# Patient Record
Sex: Female | Born: 1965 | Race: Black or African American | Hispanic: No | Smoking: Former smoker
Health system: Southern US, Community
[De-identification: ages and names within clinical notes are randomized; demographics above are authoritative.]

## PROBLEM LIST (undated history)

## (undated) DIAGNOSIS — K219 Gastro-esophageal reflux disease without esophagitis: Secondary | ICD-10-CM

## (undated) DIAGNOSIS — F32A Depression, unspecified: Secondary | ICD-10-CM

## (undated) DIAGNOSIS — F419 Anxiety disorder, unspecified: Secondary | ICD-10-CM

## (undated) DIAGNOSIS — R51 Headache: Secondary | ICD-10-CM

## (undated) DIAGNOSIS — J189 Pneumonia, unspecified organism: Secondary | ICD-10-CM

## (undated) DIAGNOSIS — F209 Schizophrenia, unspecified: Secondary | ICD-10-CM

## (undated) DIAGNOSIS — R112 Nausea with vomiting, unspecified: Secondary | ICD-10-CM

## (undated) DIAGNOSIS — J449 Chronic obstructive pulmonary disease, unspecified: Secondary | ICD-10-CM

## (undated) DIAGNOSIS — E669 Obesity, unspecified: Secondary | ICD-10-CM

## (undated) DIAGNOSIS — F4310 Post-traumatic stress disorder, unspecified: Secondary | ICD-10-CM

## (undated) DIAGNOSIS — F329 Major depressive disorder, single episode, unspecified: Secondary | ICD-10-CM

## (undated) DIAGNOSIS — Z720 Tobacco use: Secondary | ICD-10-CM

## (undated) DIAGNOSIS — Z9889 Other specified postprocedural states: Secondary | ICD-10-CM

## (undated) DIAGNOSIS — I2510 Atherosclerotic heart disease of native coronary artery without angina pectoris: Secondary | ICD-10-CM

## (undated) DIAGNOSIS — D649 Anemia, unspecified: Secondary | ICD-10-CM

## (undated) DIAGNOSIS — L659 Nonscarring hair loss, unspecified: Secondary | ICD-10-CM

## (undated) DIAGNOSIS — R519 Headache, unspecified: Secondary | ICD-10-CM

## (undated) DIAGNOSIS — K2970 Gastritis, unspecified, without bleeding: Secondary | ICD-10-CM

## (undated) DIAGNOSIS — F319 Bipolar disorder, unspecified: Secondary | ICD-10-CM

## (undated) DIAGNOSIS — I10 Essential (primary) hypertension: Secondary | ICD-10-CM

## (undated) HISTORY — PX: CARDIAC CATHETERIZATION: SHX172

## (undated) HISTORY — PX: ABDOMINAL HYSTERECTOMY: SHX81

## (undated) HISTORY — PX: FRACTURE SURGERY: SHX138

## (undated) HISTORY — PX: TUBAL LIGATION: SHX77

## (undated) HISTORY — DX: Anemia, unspecified: D64.9

## (undated) HISTORY — PX: CHOLECYSTECTOMY: SHX55

## (undated) HISTORY — DX: Obesity, unspecified: E66.9

## (undated) HISTORY — DX: Post-traumatic stress disorder, unspecified: F43.10

## (undated) HISTORY — DX: Depression, unspecified: F32.A

## (undated) HISTORY — DX: Tobacco use: Z72.0

## (undated) HISTORY — DX: Major depressive disorder, single episode, unspecified: F32.9

## (undated) HISTORY — PX: LEG SURGERY: SHX1003

---

## 1998-07-18 ENCOUNTER — Emergency Department (HOSPITAL_COMMUNITY): Admission: EM | Admit: 1998-07-18 | Discharge: 1998-07-18 | Payer: Self-pay | Admitting: Emergency Medicine

## 1998-12-03 ENCOUNTER — Encounter: Payer: Self-pay | Admitting: Emergency Medicine

## 1998-12-03 ENCOUNTER — Emergency Department (HOSPITAL_COMMUNITY): Admission: EM | Admit: 1998-12-03 | Discharge: 1998-12-03 | Payer: Self-pay | Admitting: Emergency Medicine

## 1999-08-02 ENCOUNTER — Inpatient Hospital Stay (HOSPITAL_COMMUNITY): Admission: EM | Admit: 1999-08-02 | Discharge: 1999-08-02 | Payer: Self-pay | Admitting: Obstetrics

## 1999-08-02 ENCOUNTER — Encounter: Payer: Self-pay | Admitting: Obstetrics

## 1999-12-20 ENCOUNTER — Emergency Department (HOSPITAL_COMMUNITY): Admission: EM | Admit: 1999-12-20 | Discharge: 1999-12-20 | Payer: Self-pay | Admitting: Emergency Medicine

## 2001-04-07 ENCOUNTER — Emergency Department (HOSPITAL_COMMUNITY): Admission: EM | Admit: 2001-04-07 | Discharge: 2001-04-08 | Payer: Self-pay | Admitting: Emergency Medicine

## 2001-04-07 ENCOUNTER — Encounter: Payer: Self-pay | Admitting: Emergency Medicine

## 2002-05-29 ENCOUNTER — Inpatient Hospital Stay (HOSPITAL_COMMUNITY): Admission: AD | Admit: 2002-05-29 | Discharge: 2002-05-29 | Payer: Self-pay | Admitting: Obstetrics

## 2004-02-11 ENCOUNTER — Emergency Department (HOSPITAL_COMMUNITY): Admission: EM | Admit: 2004-02-11 | Discharge: 2004-02-11 | Payer: Self-pay | Admitting: Family Medicine

## 2004-05-04 ENCOUNTER — Emergency Department (HOSPITAL_COMMUNITY): Admission: EM | Admit: 2004-05-04 | Discharge: 2004-05-04 | Payer: Self-pay | Admitting: Family Medicine

## 2004-05-06 ENCOUNTER — Emergency Department (HOSPITAL_COMMUNITY): Admission: EM | Admit: 2004-05-06 | Discharge: 2004-05-06 | Payer: Self-pay | Admitting: Family Medicine

## 2005-02-18 ENCOUNTER — Ambulatory Visit (HOSPITAL_COMMUNITY): Admission: RE | Admit: 2005-02-18 | Discharge: 2005-02-18 | Payer: Self-pay | Admitting: Obstetrics

## 2008-02-29 DIAGNOSIS — I10 Essential (primary) hypertension: Secondary | ICD-10-CM

## 2008-02-29 HISTORY — DX: Essential (primary) hypertension: I10

## 2008-03-19 ENCOUNTER — Emergency Department (HOSPITAL_COMMUNITY): Admission: EM | Admit: 2008-03-19 | Discharge: 2008-03-19 | Payer: Self-pay | Admitting: Emergency Medicine

## 2008-05-02 ENCOUNTER — Emergency Department (HOSPITAL_COMMUNITY): Admission: EM | Admit: 2008-05-02 | Discharge: 2008-05-02 | Payer: Self-pay | Admitting: Emergency Medicine

## 2008-09-24 ENCOUNTER — Ambulatory Visit: Payer: Self-pay | Admitting: Psychiatry

## 2008-09-24 ENCOUNTER — Other Ambulatory Visit (HOSPITAL_COMMUNITY): Admission: RE | Admit: 2008-09-24 | Discharge: 2008-12-23 | Payer: Self-pay | Admitting: Psychiatry

## 2008-10-22 ENCOUNTER — Ambulatory Visit: Payer: Self-pay | Admitting: Psychiatry

## 2008-10-22 ENCOUNTER — Inpatient Hospital Stay (HOSPITAL_COMMUNITY): Admission: AD | Admit: 2008-10-22 | Discharge: 2008-10-27 | Payer: Self-pay | Admitting: Psychiatry

## 2008-10-22 ENCOUNTER — Ambulatory Visit (HOSPITAL_COMMUNITY): Payer: Self-pay | Admitting: Psychiatry

## 2008-12-23 ENCOUNTER — Encounter: Payer: Self-pay | Admitting: Internal Medicine

## 2008-12-23 ENCOUNTER — Ambulatory Visit (HOSPITAL_COMMUNITY): Admission: RE | Admit: 2008-12-23 | Discharge: 2008-12-23 | Payer: Self-pay | Admitting: Internal Medicine

## 2009-03-05 ENCOUNTER — Emergency Department (HOSPITAL_COMMUNITY): Admission: EM | Admit: 2009-03-05 | Discharge: 2009-03-05 | Payer: Self-pay | Admitting: Emergency Medicine

## 2009-03-05 ENCOUNTER — Emergency Department (HOSPITAL_COMMUNITY): Admission: EM | Admit: 2009-03-05 | Discharge: 2009-03-05 | Payer: Self-pay | Admitting: Family Medicine

## 2009-03-24 ENCOUNTER — Inpatient Hospital Stay (HOSPITAL_COMMUNITY)
Admission: EM | Admit: 2009-03-24 | Discharge: 2009-03-30 | Payer: Self-pay | Source: Home / Self Care | Admitting: Emergency Medicine

## 2009-03-24 DIAGNOSIS — J189 Pneumonia, unspecified organism: Secondary | ICD-10-CM | POA: Insufficient documentation

## 2009-03-25 ENCOUNTER — Encounter (INDEPENDENT_AMBULATORY_CARE_PROVIDER_SITE_OTHER): Payer: Self-pay | Admitting: Internal Medicine

## 2009-03-25 LAB — CONVERTED CEMR LAB
ALT: 15 units/L
BUN: 6 mg/dL
Chloride: 105 meq/L
Potassium: 136 meq/L
Total Protein: 6.2 g/dL

## 2009-03-30 ENCOUNTER — Encounter (INDEPENDENT_AMBULATORY_CARE_PROVIDER_SITE_OTHER): Payer: Self-pay | Admitting: Nurse Practitioner

## 2009-04-06 ENCOUNTER — Ambulatory Visit: Payer: Self-pay | Admitting: Nurse Practitioner

## 2009-04-06 DIAGNOSIS — F319 Bipolar disorder, unspecified: Secondary | ICD-10-CM | POA: Insufficient documentation

## 2009-04-06 DIAGNOSIS — R252 Cramp and spasm: Secondary | ICD-10-CM | POA: Insufficient documentation

## 2009-04-06 DIAGNOSIS — F411 Generalized anxiety disorder: Secondary | ICD-10-CM | POA: Insufficient documentation

## 2009-04-06 DIAGNOSIS — Z794 Long term (current) use of insulin: Secondary | ICD-10-CM

## 2009-04-06 DIAGNOSIS — Z9089 Acquired absence of other organs: Secondary | ICD-10-CM | POA: Insufficient documentation

## 2009-04-06 DIAGNOSIS — E8941 Symptomatic postprocedural ovarian failure: Secondary | ICD-10-CM | POA: Insufficient documentation

## 2009-04-06 DIAGNOSIS — F4310 Post-traumatic stress disorder, unspecified: Secondary | ICD-10-CM | POA: Insufficient documentation

## 2009-04-06 DIAGNOSIS — K219 Gastro-esophageal reflux disease without esophagitis: Secondary | ICD-10-CM | POA: Insufficient documentation

## 2009-04-06 DIAGNOSIS — I10 Essential (primary) hypertension: Secondary | ICD-10-CM | POA: Insufficient documentation

## 2009-04-06 DIAGNOSIS — E119 Type 2 diabetes mellitus without complications: Secondary | ICD-10-CM | POA: Insufficient documentation

## 2009-04-06 LAB — CONVERTED CEMR LAB
Blood Glucose, Fingerstick: 89
Nitrite: NEGATIVE
Specific Gravity, Urine: >=1.03
Urobilinogen, UA: 0.2
WBC Urine, dipstick: NEGATIVE
pH: 5.5

## 2009-04-07 ENCOUNTER — Encounter (INDEPENDENT_AMBULATORY_CARE_PROVIDER_SITE_OTHER): Payer: Self-pay | Admitting: Nurse Practitioner

## 2009-04-14 ENCOUNTER — Ambulatory Visit: Payer: Self-pay | Admitting: Nurse Practitioner

## 2009-04-28 ENCOUNTER — Ambulatory Visit: Payer: Self-pay | Admitting: Nurse Practitioner

## 2009-05-04 ENCOUNTER — Ambulatory Visit: Payer: Self-pay | Admitting: Nurse Practitioner

## 2009-05-04 LAB — CONVERTED CEMR LAB
Bilirubin Urine: NEGATIVE
Calcium: 9.2 mg/dL (ref 8.4–10.5)
Creatinine, Ser: 0.9 mg/dL (ref 0.40–1.20)
Glucose, Urine, Semiquant: NEGATIVE
Ketones, urine, test strip: NEGATIVE
Potassium: 4.5 meq/L (ref 3.5–5.3)
Protein, U semiquant: NEGATIVE
Specific Gravity, Urine: 1.01
Urobilinogen, UA: 0.2
WBC Urine, dipstick: NEGATIVE

## 2009-06-01 ENCOUNTER — Ambulatory Visit: Payer: Self-pay | Admitting: Nurse Practitioner

## 2009-06-01 LAB — CONVERTED CEMR LAB: Blood Glucose, Fingerstick: 128

## 2009-06-02 ENCOUNTER — Ambulatory Visit: Payer: Self-pay | Admitting: Nurse Practitioner

## 2009-07-02 ENCOUNTER — Ambulatory Visit: Payer: Self-pay | Admitting: Nurse Practitioner

## 2009-07-20 ENCOUNTER — Ambulatory Visit: Payer: Self-pay | Admitting: Nurse Practitioner

## 2009-07-23 ENCOUNTER — Encounter (INDEPENDENT_AMBULATORY_CARE_PROVIDER_SITE_OTHER): Payer: Self-pay | Admitting: Nurse Practitioner

## 2009-07-30 ENCOUNTER — Encounter (INDEPENDENT_AMBULATORY_CARE_PROVIDER_SITE_OTHER): Payer: Self-pay | Admitting: Nurse Practitioner

## 2009-08-03 ENCOUNTER — Ambulatory Visit: Payer: Self-pay | Admitting: Nurse Practitioner

## 2009-08-03 DIAGNOSIS — E669 Obesity, unspecified: Secondary | ICD-10-CM | POA: Insufficient documentation

## 2009-08-03 DIAGNOSIS — F17200 Nicotine dependence, unspecified, uncomplicated: Secondary | ICD-10-CM | POA: Insufficient documentation

## 2009-08-03 DIAGNOSIS — R3129 Other microscopic hematuria: Secondary | ICD-10-CM | POA: Insufficient documentation

## 2009-08-03 DIAGNOSIS — B370 Candidal stomatitis: Secondary | ICD-10-CM | POA: Insufficient documentation

## 2009-08-03 LAB — CONVERTED CEMR LAB
KOH Prep: NEGATIVE
Ketones, urine, test strip: NEGATIVE
OCCULT 1: NEGATIVE
Pap Smear: NEGATIVE
Protein, U semiquant: NEGATIVE
Specific Gravity, Urine: >=1.03
Urobilinogen, UA: 1

## 2009-08-04 ENCOUNTER — Encounter (INDEPENDENT_AMBULATORY_CARE_PROVIDER_SITE_OTHER): Payer: Self-pay | Admitting: Nurse Practitioner

## 2009-08-04 LAB — CONVERTED CEMR LAB
BUN: 16 mg/dL (ref 6–23)
Calcium: 9 mg/dL (ref 8.4–10.5)
Chlamydia, DNA Probe: NEGATIVE
Eosinophils Absolute: 0.2 10*3/uL (ref 0.0–0.7)
Glucose, Bld: 105 mg/dL — ABNORMAL HIGH (ref 70–99)
Hemoglobin: 10.7 g/dL — ABNORMAL LOW (ref 12.0–15.0)
LDL Cholesterol: 109 mg/dL — ABNORMAL HIGH (ref 0–99)
Lymphocytes Relative: 24 % (ref 12–46)
Lymphs Abs: 2.3 10*3/uL (ref 0.7–4.0)
Monocytes Absolute: 0.6 10*3/uL (ref 0.1–1.0)
Neutro Abs: 6.6 10*3/uL (ref 1.7–7.7)
Potassium: 4.4 meq/L (ref 3.5–5.3)
RBC: 4.2 M/uL (ref 3.87–5.11)
TSH: 1.381 microintl units/mL (ref 0.350–4.500)
Total Protein: 7.4 g/dL (ref 6.0–8.3)
Triglycerides: 82 mg/dL (ref <150)

## 2009-08-05 DIAGNOSIS — D649 Anemia, unspecified: Secondary | ICD-10-CM | POA: Insufficient documentation

## 2009-08-05 LAB — CONVERTED CEMR LAB: Retic Ct Pct: 1.6 % (ref 0.4–3.1)

## 2009-08-07 ENCOUNTER — Encounter (INDEPENDENT_AMBULATORY_CARE_PROVIDER_SITE_OTHER): Payer: Self-pay | Admitting: Nurse Practitioner

## 2009-08-11 ENCOUNTER — Ambulatory Visit (HOSPITAL_COMMUNITY): Admission: RE | Admit: 2009-08-11 | Discharge: 2009-08-11 | Payer: Self-pay | Admitting: Internal Medicine

## 2009-08-20 ENCOUNTER — Encounter (INDEPENDENT_AMBULATORY_CARE_PROVIDER_SITE_OTHER): Payer: Self-pay | Admitting: Nurse Practitioner

## 2009-08-24 ENCOUNTER — Encounter (INDEPENDENT_AMBULATORY_CARE_PROVIDER_SITE_OTHER): Payer: Self-pay | Admitting: Nurse Practitioner

## 2009-08-25 ENCOUNTER — Ambulatory Visit: Payer: Self-pay | Admitting: Nurse Practitioner

## 2009-08-31 ENCOUNTER — Inpatient Hospital Stay (HOSPITAL_COMMUNITY): Admission: EM | Admit: 2009-08-31 | Discharge: 2009-09-03 | Payer: Self-pay | Admitting: Emergency Medicine

## 2009-09-02 ENCOUNTER — Ambulatory Visit: Payer: Self-pay | Admitting: Vascular Surgery

## 2009-09-02 ENCOUNTER — Encounter (INDEPENDENT_AMBULATORY_CARE_PROVIDER_SITE_OTHER): Payer: Self-pay | Admitting: Internal Medicine

## 2009-09-03 ENCOUNTER — Encounter (INDEPENDENT_AMBULATORY_CARE_PROVIDER_SITE_OTHER): Payer: Self-pay | Admitting: Nurse Practitioner

## 2009-09-10 ENCOUNTER — Ambulatory Visit: Payer: Self-pay | Admitting: Nurse Practitioner

## 2009-09-14 ENCOUNTER — Ambulatory Visit: Payer: Self-pay | Admitting: Diagnostic Radiology

## 2009-09-14 ENCOUNTER — Emergency Department (HOSPITAL_BASED_OUTPATIENT_CLINIC_OR_DEPARTMENT_OTHER): Admission: EM | Admit: 2009-09-14 | Discharge: 2009-09-14 | Payer: Self-pay | Admitting: Emergency Medicine

## 2009-10-20 ENCOUNTER — Ambulatory Visit: Payer: Self-pay | Admitting: Nurse Practitioner

## 2009-12-23 ENCOUNTER — Telehealth (INDEPENDENT_AMBULATORY_CARE_PROVIDER_SITE_OTHER): Payer: Self-pay | Admitting: Nurse Practitioner

## 2010-02-04 ENCOUNTER — Observation Stay (HOSPITAL_COMMUNITY)
Admission: EM | Admit: 2010-02-04 | Discharge: 2010-02-05 | Payer: Self-pay | Source: Home / Self Care | Attending: Family Medicine | Admitting: Family Medicine

## 2010-02-04 ENCOUNTER — Emergency Department (HOSPITAL_COMMUNITY)
Admission: EM | Admit: 2010-02-04 | Discharge: 2010-02-04 | Disposition: A | Discharge status: Other Acute Inpatient Hospital | Payer: Self-pay | Source: Home / Self Care | Admitting: Emergency Medicine

## 2010-02-04 LAB — CONVERTED CEMR LAB
BUN: 5 mg/dL
Hemoglobin: 10.4 g/dL
MCV: 77.3 fL
Potassium: 3.4 meq/L
RBC: 4.14 M/uL
Sodium: 137 meq/L
WBC: 11.1 10*3/uL

## 2010-03-25 ENCOUNTER — Ambulatory Visit
Admission: RE | Admit: 2010-03-25 | Discharge: 2010-03-25 | Payer: Self-pay | Source: Home / Self Care | Attending: Nurse Practitioner | Admitting: Nurse Practitioner

## 2010-03-25 LAB — CONVERTED CEMR LAB
Blood Glucose, Fingerstick: 232
Glucose, Urine, Semiquant: >=1000
Nitrite: NEGATIVE
Specific Gravity, Urine: >=1.03

## 2010-03-26 ENCOUNTER — Telehealth (INDEPENDENT_AMBULATORY_CARE_PROVIDER_SITE_OTHER): Payer: Self-pay | Admitting: Nurse Practitioner

## 2010-03-30 NOTE — Progress Notes (Signed)
Summary: Office Visit//DEPRESION SCREENING  Office Visit//DEPRESION SCREENING   Imported By: Arta Bruce 08/04/2009 09:47:51  _____________________________________________________________________  External Attachment:    Type:   Image     Comment:   External Document

## 2010-03-30 NOTE — Letter (Signed)
Summary: PT INFORMATION SHEET  PT INFORMATION SHEET   Imported By: Arta Bruce 05/27/2009 13:01:36  _____________________________________________________________________  External Attachment:    Type:   Image     Comment:   External Document

## 2010-03-30 NOTE — Assessment & Plan Note (Signed)
Summary: Diabetes   Vital Signs:  Patient profile:   45 year old female Menstrual status:  hysterectomy Weight:      239.8 pounds BMI:     42.63 BSA:     2.09 Temp:     97.9 degrees F oral Pulse rate:   96 / minute Pulse rhythm:   regular Resp:     20 per minute BP sitting:   126 / 85  (left arm) Cuff size:   large  Vitals Entered By: Levon Hedger (June 01, 2009 2:10 PM) CC: follow-up visit DM....muscle spasms...left foot pain, Depression, Hypertension Management, Abdominal Pain Is Patient Diabetic? Yes Pain Assessment Patient in pain? yes     Location: left foot, leg CBG Result 128 CBG Device ID B  Does patient need assistance? Functional Status Self care Ambulation Normal   CC:  follow-up visit DM....muscle spasms...left foot pain, Depression, Hypertension Management, and Abdominal Pain.  History of Present Illness:  Pt into the office for follow up on diabetes.  Social - Pt is still living with her significant other. She has 24 supervision from family and friends.  She is not driving at this time. [facility] is coming out to the pt's house to check on medications.    Obesity - weight gain since her last visit.  Pt seems disappointed in her weight gain.  ? as if any of her medications are causing the problems  cousin present with pt today The patient denies that she feels like life is not worth living, denies that she wishes that she were dead, and denies that she has thought about ending her life.        Comments:  Pt has been to the [facility] since her last visit in this office.  Her medication as been adjusted.  Dyspepsia History:      She has no alarm features of dyspepsia including no history of melena, hematochezia, dysphagia, persistent vomiting, or involuntary weight loss > 5%.  There is a prior history of GERD.  The patient does not have a prior history of documented ulcer disease.  The dominant symptom is not heartburn or acid  reflux.  An H-2 blocker medication is not currently being taken.  No previous upper endoscopy has been done.    Hypertension History:      She denies headache, chest pain, and palpitations.  She notes no problems with any antihypertensive medication side effects.  Pt is taking her medications as ordered.        Positive major cardiovascular risk factors include diabetes and hypertension.  Negative major cardiovascular risk factors include female age less than 71 years old.        Further assessment for target organ damage reveals no history of ASHD, cardiac end-organ damage (CHF/LVH), stroke/TIA, peripheral vascular disease, renal insufficiency, or hypertensive retinopathy.       Allergies (verified): 1)  ! Pepcid 2)  ! Demerol  Review of Systems ENT:  Denies earache. CV:  Denies chest pain or discomfort. Resp:  Complains of shortness of breath. GI:  Denies abdominal pain, nausea, and vomiting. Psych:  Complains of anxiety, depression, and mental problems.  Physical Exam  General:  alert.  obese Head:  normocephalic.   Ears:  ear piercing(s) noted.   Lungs:  normal breath sounds.   Heart:  normal rate and regular rhythm.   Abdomen:  non-tender and normal bowel sounds.    Diabetes Management Exam:    Foot Exam (with socks  and/or shoes not present):       Sensory-Monofilament:          Left foot: normal          Right foot: normal   Impression & Recommendations:  Problem # 1:  DIABETES MELLITUS (ICD-250.00) stable continue current medications Her updated medication list for this problem includes:    Metformin Hcl 500 Mg Tabs (Metformin hcl) ..... One tablet by mouth two times a day for diabetes    Aspirin 81 Mg Tbec (Aspirin) ..... One tablet by mouth daily  Orders: Capillary Blood Glucose/CBG (54098)  Problem # 2:  BIPOLAR AFFECTIVE DISORDER (ICD-296.80) pt to maintain f/u at the [facility] still very agitated and with lose thought processes  Problem # 3:   HYPERTENSION, BENIGN ESSENTIAL (ICD-401.1) BP is stable DASH diet Her updated medication list for this problem includes:    Atenolol 25 Mg Tabs (Atenolol) ..... One tablet by mouth two times a day for blood pressure    Furosemide 20 Mg Tabs (Furosemide) .Marland Kitchen... 2 tablets by mouth daily  Problem # 4:  GERD (ICD-530.81) stable Her updated medication list for this problem includes:    Omeprazole 20 Mg Tbec (Omeprazole) ..... One tablet by mouth two times a day before breakfast and dinner  Complete Medication List: 1)  Metformin Hcl 500 Mg Tabs (Metformin hcl) .... One tablet by mouth two times a day for diabetes 2)  Atenolol 25 Mg Tabs (Atenolol) .... One tablet by mouth two times a day for blood pressure 3)  Lorazepam 0.5 Mg Tabs (Lorazepam) .... One tablet by mouth two times a day 4)  Furosemide 20 Mg Tabs (Furosemide) .... 2 tablets by mouth daily 5)  Omeprazole 20 Mg Tbec (Omeprazole) .... One tablet by mouth two times a day before breakfast and dinner 6)  Seroquel 200 Mg Tabs (Quetiapine fumarate) .... Four tablets by mouth nighty 7)  Seroquel 25 Mg Tabs (Quetiapine fumarate) .... One tablet by mouth two times a day 8)  Aspirin 81 Mg Tbec (Aspirin) .... One tablet by mouth daily 9)  Blood Glucose Meter Kit (Blood glucose monitoring suppl) .... Use to check blood sugar daily before breakfast 10)  Tramadol Hcl 50 Mg Tabs (Tramadol hcl) .... One tablet by mouth daily as needed for pain 11)  Fluoxetine Hcl 20 Mg Caps (Fluoxetine hcl) .... 2 capsules by mouth daily **rx by psych*8  Hypertension Assessment/Plan:      [name] hypertensive risk group is category C: Target organ damage and/or diabetes.  Today's blood pressure is 126/85.  Her blood pressure goal is < 130/80.  Patient Instructions: 1)  Roll a can or a ball under your foot.  10 in the morning and 10 times at night. 2)  Increase exercise - you need to go for walks in the afternoons  3)  This will help build up your lung  functions 4)  Keep your appointments at [facility] 5)  Follow up in this office in 2 months for a complete complete physical exam.  you will need PAP, EKG, mammogram. Prescriptions: ASPIRIN 81 MG TBEC (ASPIRIN) One tablet by mouth daily  #30 x 5   Entered and Authorized by:   Lehman Prom FNP   Signed by:   Lehman Prom FNP on 06/01/2009   Method used:   Print then Give to Patient   RxID:   [id number] OMEPRAZOLE 20 MG TBEC (OMEPRAZOLE) One tablet by mouth two times a day before breakfast  and dinner  #60 x 5   Entered and Authorized by:   Lehman Prom FNP   Signed by:   Lehman Prom FNP on 06/01/2009   Method used:   Print then Give to Patient   RxID:   [id number] FUROSEMIDE 20 MG TABS (FUROSEMIDE) 2 tablets by mouth daily  #60 x 5   Entered and Authorized by:   Lehman Prom FNP   Signed by:   Lehman Prom FNP on 06/01/2009   Method used:   Print then Give to Patient   RxID:   [id number] METFORMIN HCL 500 MG TABS (METFORMIN HCL) One tablet by mouth two times a day for diabetes  #60 x 5   Entered and Authorized by:   Lehman Prom FNP   Signed by:   Lehman Prom FNP on 06/01/2009   Method used:   Print then Give to Patient   RxID:   [id number] ATENOLOL 25 MG TABS (ATENOLOL) One tablet by mouth two times a day for blood pressure  #60 x 5   Entered and Authorized by:   Lehman Prom FNP   Signed by:   Lehman Prom FNP on 06/01/2009   Method used:   Print then Give to Patient   RxID:   [id number]          Diabetic Foot Exam    10-g (5.07) Semmes-Weinstein Monofilament Test Performed by: Levon Hedger          Right Foot          Left Foot Visual Inspection               Test Control      normal         normal Site 1         normal         normal Site 2         normal         normal Site 3         normal         normal Site 4         abnormal         normal Site 5         abnormal          normal Site 6         abnormal         normal Site 7         normal         normal Site 8         normal         normal Site 9         normal         normal Site 10         abnormal         normal  Impression      normal         normal

## 2010-03-30 NOTE — Assessment & Plan Note (Signed)
Summary: F/u Hospital visit   Vital Signs:  Patient profile:   45 year old female Menstrual status:  hysterectomy Weight:      244.2 pounds BMI:     43.41 O2 Sat:      100 % on Room air Temp:     98.3 degrees F oral Pulse rate:   88 / minute Pulse rhythm:   regular Resp:     20 per minute BP sitting:   124 / 80  (left arm) Cuff size:   large  Vitals Entered By: Levon Hedger (October 20, 2009 11:00 AM)  Nutrition Counseling: Patient's BMI is greater than 25 and therefore counseled on weight management options.  O2 Flow:  Room air  Serial Vital Signs/Assessments:  Comments: after walking 02 sat was 100% Pulse 98 By: Levon Hedger   CC: chest pain x 3 days feels out of breath and it feels like fluid has come back around in her chest...feels heaviness in her chest., Hypertension Management, Abdominal Pain Is Patient Diabetic? Yes Pain Assessment Patient in pain? yes     Location: chest Type: heaviness CBG Result 63 CBG Device ID B  Does patient need assistance? Functional Status Self care Ambulation Normal   CC:  chest pain x 3 days feels out of breath and it feels like fluid has come back around in her chest...feels heaviness in her chest., Hypertension Management, and Abdominal Pain.  History of Present Illness:  Pt into the office for f/u - "chest pain" She was hospitalized from 08/31/2009 and 09/03/2009. She had scheduled f/u visits in this office but did not keep the appt. Full hospital D/C reviewed  1.  Bilateral community acquired pneumonia - In the hospital pt was started on Avelox.  CT angiogram done - negative and dopplers - negative.  Pt was switched to cipro and she has already finished these meds.  2.  Substeranl chest pain, pleuritic - main complaint was mild central chest pain on deep inspiration.  Pain was thought to be musculosketal in nature  3. Bipolar - pt has ongoing f/u with the [facility].  Her medications have been changed by that  office.  Medications present today in office.  Diabetes Management History:      The patient is a 45 years old female who comes in for evaluation of DM Type 2.  She has not been enrolled in the "Diabetic Education Program".  She states lack of understanding of dietary principles and is not following her diet appropriately.  No sensory loss is reported.  Self foot exams are not being performed.  She is checking home blood sugars.  She says that she is not exercising regularly.        Hypoglycemic symptoms are not occurring.  No hyperglycemic symptoms are reported.    Dyspepsia History:      She has no alarm features of dyspepsia including no history of melena, hematochezia, dysphagia, persistent vomiting, or involuntary weight loss > 5%.  There is a prior history of GERD.  The patient does not have a prior history of documented ulcer disease.  The dominant symptom is not heartburn or acid reflux.  An H-2 blocker medication is not currently being taken.  She has no history of a positive H. Pylori serology.  No previous upper endoscopy has been done.    Hypertension History:      She denies headache, chest pain, and palpitations.  She notes no problems with any antihypertensive medication side effects.  Positive major cardiovascular risk factors include diabetes, hypertension, and current tobacco user.  Negative major cardiovascular risk factors include female age less than 61 years old.        Further assessment for target organ damage reveals no history of ASHD, cardiac end-organ damage (CHF/LVH), stroke/TIA, peripheral vascular disease, renal insufficiency, or hypertensive retinopathy.      Habits & Providers  Alcohol-Tobacco-Diet     Alcohol drinks/day: <1     Tobacco Status: current     Tobacco Counseling: to quit use of tobacco products     Cigarette Packs/Day: 2.0  Exercise-Depression-Behavior     Does Patient Exercise: no     Depression Counseling: further diagnostic testing  and/or other treatment is indicated     Drug Use: never  Current Medications (verified): 1)  Metformin Hcl 500 Mg Tabs (Metformin Hcl) .... One Tablet By Mouth Two Times A Day For Diabetes 2)  Atenolol 25 Mg Tabs (Atenolol) .... One Tablet By Mouth Two Times A Day For Blood Pressure 3)  Lorazepam 0.5 Mg Tabs (Lorazepam) .... One Tablet By Mouth Two Times A Day 4)  Furosemide 20 Mg Tabs (Furosemide) .... 2 Tablets By Mouth Daily 5)  Protonix 40 Mg Tbec (Pantoprazole Sodium) .... One Tablet By Mouth Two Times A Day For Stomach 6)  Aspirin 81 Mg Tbec (Aspirin) .... One Tablet By Mouth Daily 7)  Blood Glucose Meter  Kit (Blood Glucose Monitoring Suppl) .... Use To Check Blood Sugar Daily Before Breakfast 8)  Fluoxetine Hcl 20 Mg Caps (Fluoxetine Hcl) .... 2 Capsules By Mouth Daily **rx By Psych*8 9)  Glucotrol Xl 5 Mg Xr24h-Tab (Glipizide) .... One Tablet By Mouth Daily For Blood Sugar 10)  Naprosyn 500 Mg Tabs (Naproxen) .... One Tablet By Mouth Daily As Needed For Leg Pain 11)  Ferrous Sulfate 325 (65 Fe) Mg Tabs (Ferrous Sulfate) .... One Tablet By Mouth Daily 12)  Zolpidem Tartrate 10 Mg Tabs (Zolpidem Tartrate) .... One Tablet By Mouth Nightly  **rx By Psych** 13)  Risperidone 3 Mg Tabs (Risperidone) .... 1/2 Tablet By Mouth At Bedtime **rx By Psych** 14)  Hydroxyzine Hcl 50 Mg Tabs (Hydroxyzine Hcl) .... One or Two Tablets By Mouth At Bedtime **rx By Psych**  Allergies (verified): 1)  ! Pepcid 2)  ! Demerol  Social History: Does Patient Exercise:  no  Review of Systems CV:  Complains of chest pain or discomfort; denies shortness of breath with exertion. Resp:  Denies cough. GI:  Denies abdominal pain, nausea, and vomiting.  Physical Exam  General:  alert.   Head:  normocephalic.   Lungs:  normal breath sounds.   Heart:  normal rate and regular rhythm.   Abdomen:  normal bowel sounds.   Msk:  normal ROM.   Neurologic:  alert & oriented X3.   Skin:  color normal.   Psych:   Oriented X3.    Diabetes Management Exam:       Nails:          Left foot: normal          Right foot: normal   Impression & Recommendations:  Problem # 1:  Hx of PNEUMONIA (ICD-486) pt has finished antibiotics o2 sat WNL will give advair inhalar (sample) Orders: Pulse Oximetry (single measurment) (16109)  Problem # 2:  DIABETES MELLITUS (ICD-250.00)  Her updated medication list for this problem includes:    Metformin Hcl 500 Mg Tabs (Metformin hcl) ..... One tablet by mouth two times a day for  diabetes    Aspirin 81 Mg Tbec (Aspirin) ..... One tablet by mouth daily    Glucotrol Xl 5 Mg Xr24h-tab (Glipizide) ..... One tablet by mouth daily for blood sugar  Orders: Capillary Blood Glucose/CBG (04540)  Problem # 3:  HYPERTENSION, BENIGN ESSENTIAL (ICD-401.1)  Her updated medication list for this problem includes:    Atenolol 25 Mg Tabs (Atenolol) ..... One tablet by mouth two times a day for blood pressure    Furosemide 20 Mg Tabs (Furosemide) .Marland Kitchen... 2 tablets by mouth daily  Problem # 4:  ANEMIA (ICD-285.9)  Her updated medication list for this problem includes:    Ferrous Sulfate 325 (65 Fe) Mg Tabs (Ferrous sulfate) ..... One tablet by mouth daily  Complete Medication List: 1)  Metformin Hcl 500 Mg Tabs (Metformin hcl) .... One tablet by mouth two times a day for diabetes 2)  Atenolol 25 Mg Tabs (Atenolol) .... One tablet by mouth two times a day for blood pressure 3)  Lorazepam 0.5 Mg Tabs (Lorazepam) .... One tablet by mouth two times a day 4)  Furosemide 20 Mg Tabs (Furosemide) .... 2 tablets by mouth daily 5)  Protonix 40 Mg Tbec (Pantoprazole sodium) .... One tablet by mouth two times a day for stomach 6)  Aspirin 81 Mg Tbec (Aspirin) .... One tablet by mouth daily 7)  Blood Glucose Meter Kit (Blood glucose monitoring suppl) .... Use to check blood sugar daily before breakfast 8)  Fluoxetine Hcl 20 Mg Caps (Fluoxetine hcl) .... 2 capsules by mouth daily **rx by  psych*8 9)  Glucotrol Xl 5 Mg Xr24h-tab (Glipizide) .... One tablet by mouth daily for blood sugar 10)  Naprosyn 500 Mg Tabs (Naproxen) .... One tablet by mouth daily as needed for leg pain 11)  Ferrous Sulfate 325 (65 Fe) Mg Tabs (Ferrous sulfate) .... One tablet by mouth daily 12)  Zolpidem Tartrate 10 Mg Tabs (Zolpidem tartrate) .... One tablet by mouth nightly  **rx by psych** 13)  Risperidone 3 Mg Tabs (Risperidone) .... 1/2 tablet by mouth at bedtime **rx by psych** 14)  Hydroxyzine Hcl 50 Mg Tabs (Hydroxyzine hcl) .... One or two tablets by mouth at bedtime **rx by psych**  Diabetes Management Assessment/Plan:      Her blood pressure goal is < 130/80.    Hypertension Assessment/Plan:      [name] hypertensive risk group is category C: Target organ damage and/or diabetes.  Her calculated 10 year risk of coronary heart disease is 8 %.  Today's blood pressure is 124/80.  Her blood pressure goal is < 130/80.  Patient Instructions: 1)  Use the Advair - 1 inhalation twice per day (sample given) 2)  Chest pain may be due to shortness of breath. 3)  Continue current medications as ordered 4)  Follow up in this office in 4 weeks or sooner if necessary. 5)  Will need cbc and follow up cxray.  Laboratory Results   Blood Tests   Date/Time Received: October 20, 2009 12:47 PM   CBG Random:: 63       MRI EXAM  Procedure date:  08/31/2009  Findings:      CT angiogram - no Ct findings for pulmonary embolism.   normal thoracic aorta borderline enlarged mediastinal and hilar lymph nodes and persistence or recurrent patchy bilateral air space process  CXR  Procedure date:  08/31/2009  Findings:      Impression, widespread bilateral patchy pulmonary infilatrated consistent with bronchopneumonia  Venous Doppler  Procedure date:  08/31/2009  Findings:      no evidence of DVT or superficial thrombosis involoving the right lower extremity and left lower extremity no evidence  of baker's cyst on right or left   MRI EXAM  Procedure date:  08/31/2009  Findings:      CT angiogram - no Ct findings for pulmonary embolism.   normal thoracic aorta borderline enlarged mediastinal and hilar lymph nodes and persistence or recurrent patchy bilateral air space process  CXR  Procedure date:  08/31/2009  Findings:      Impression, widespread bilateral patchy pulmonary infilatrated consistent with bronchopneumonia  Venous Doppler  Procedure date:  08/31/2009  Findings:      no evidence of DVT or superficial thrombosis involoving the right lower extremity and left lower extremity no evidence of baker's cyst on right or left

## 2010-03-30 NOTE — Assessment & Plan Note (Signed)
Summary: Left without being seen   Allergies: 1)  ! Pepcid 2)  ! Demerol   Complete Medication List: 1)  Metformin Hcl 500 Mg Tabs (Metformin hcl) .... One tablet by mouth two times a day for diabetes 2)  Atenolol 25 Mg Tabs (Atenolol) .... One tablet by mouth two times a day for blood pressure 3)  Lorazepam 0.5 Mg Tabs (Lorazepam) .... One tablet by mouth two times a day 4)  Furosemide 20 Mg Tabs (Furosemide) .... 2 tablets by mouth daily 5)  Omeprazole 20 Mg Tbec (Omeprazole) .... One tablet by mouth two times a day before breakfast and dinner 6)  Seroquel 200 Mg Tabs (Quetiapine fumarate) .... Four tablets by mouth nighty 7)  Seroquel 25 Mg Tabs (Quetiapine fumarate) .... One tablet by mouth two times a day 8)  Aspirin 81 Mg Tbec (Aspirin) .... One tablet by mouth daily 9)  Blood Glucose Meter Kit (Blood glucose monitoring suppl) .... Use to check blood sugar daily before breakfast 10)  Tramadol Hcl 50 Mg Tabs (Tramadol hcl) .... One tablet by mouth daily as needed for pain 11)  Fluoxetine Hcl 20 Mg Caps (Fluoxetine hcl) .... 2 capsules by mouth daily **rx by psych*8 12)  Abilify 15 Mg Tabs (Aripiprazole) .... Rx by [facility] 13)  Nystatin 100000 Unit/ml Susp (Nystatin) .... 5ml by mouth three times a day before meals swish and swallow 14)  Glucotrol Xl 5 Mg Xr24h-tab (Glipizide) .... One tablet by mouth daily for blood sugar 15)  Naprosyn 500 Mg Tabs (Naproxen) .... One tablet by mouth daily as needed for leg pain 16)  Ferrous Sulfate 325 (65 Fe) Mg Tabs (Ferrous sulfate) .... One tablet by mouth daily 17)  Fluconazole 150 Mg Tabs (Fluconazole) .... One tablet by mouth x 1 dose

## 2010-03-30 NOTE — Letter (Signed)
Summary: FAXED REQUESTED RECORDS TO [facility]  FAXED REQUESTED RECORDS TO [facility]   Imported By: Arta Bruce 07/23/2009 11:16:30  _____________________________________________________________________  External Attachment:    Type:   Image     Comment:   External Document

## 2010-03-30 NOTE — Letter (Addendum)
Summary: DISABILITY [facility] Alvarez  DISABILITY [facility]varez   Imported By: Arta Bruce 11/26/2009 12:40:58  _____________________________________________________________________  External Attachments:     1. Type:   Image          Comment:   External Document    2. Type:   Image          Comment:   External Document

## 2010-03-30 NOTE — Letter (Signed)
Summary: *HSN Results Follow up  [facility]  [address] [place], [place] [address]   Phone: [phone]  Fax: [phone]      08/07/2009   [phone] [place], [place]  [address]   Dear  Ms. [facility] Gertz,                            ____S.Drinkard,FNP   ____D. Gore,FNP       ____B. McPherson,MD   ____V. Rankins,MD    ____E. Mulberry,MD    __X__N. Daphine Deutscher, FNP  ____D. Reche Dixon, MD    ____K. Philipp Deputy, MD    ____Other     This letter is to inform you that your recent test(s):  ___X____Pap Smear    _______Lab Test     _______X-ray    ___X____ is within acceptable limits  _______ requires a medication change  _______ requires a follow-up lab visit  _______ requires a follow-up visit with your provider   Comments: Pap smear results normal during recent except you did have a yeast infection.  A prescription for diflucan 150mg  has been sent to [facility] pharmacy.         _________________________________________________________ If you have any questions, please contact our office [phone]                    Sincerely,    Lehman Prom FNP [facility]

## 2010-03-30 NOTE — Letter (Signed)
Summary: Discharge Summary  Discharge Summary   Imported By: Arta Bruce 09/16/2009 12:15:49  _____________________________________________________________________  External Attachment:    Type:   Image     Comment:   External Document

## 2010-03-30 NOTE — Letter (Signed)
Summary: Discharge Summary  Discharge Summary   Imported By: Arta Bruce 08/04/2009 09:29:25  _____________________________________________________________________  External Attachment:    Type:   Image     Comment:   External Document

## 2010-03-30 NOTE — Letter (Signed)
Summary: FAXED REQUESTED RECORDS TO THE [facility]  FAXED REQUESTED RECORDS TO THE [facility]   Imported By: Arta Bruce 09/14/2009 10:14:53  _____________________________________________________________________  External Attachment:    Type:   Image     Comment:   External Document

## 2010-03-30 NOTE — Assessment & Plan Note (Signed)
Summary: Diabetes   Vital Signs:  Patient profile:   45 year old female Menstrual status:  hysterectomy Height:      63 inches Weight:      234.8 pounds Temp:     97.5 degrees F oral Pulse rate:   85 / minute Pulse rhythm:   regular Resp:     24 per minute BP sitting:   119 / 85  (right arm) Cuff size:   large  Vitals Entered By: Arthor Captain (May 04, 2009 10:03 AM) CC: f/u dm, Hypertension Management, Abdominal Pain Is Patient Diabetic? Yes Pain Assessment Patient in pain? yes     Location: leg pain Intensity: 9 Type: cramping Onset of pain  3 weeks CBG Result 145 CBG Device ID B  Does patient need assistance? Functional Status Self care Ambulation Normal Comments leg cramps, feels SOB has to sleep with 3 pillows behind her head, trouble sleeping   CC:  f/u dm, Hypertension Management, and Abdominal Pain.  History of Present Illness:  Pt into the office for diabetes.  Family present today in office.  Reports that since 08/2008 pt was functional with activities of daily living and also employed.   Pt had a nervous breakdown and had to go into behavioral health.  Since that time she has been very difficult to maintain.  Family reports that at this point she is not left alone for any extended period of time due to her safety.  Family reports that she used the scissors and cut off all her hair, she is wearing a weave today.  Diabetes Management History:      The patient is a 45 years old female who comes in for evaluation of DM Type 2.  She has not been enrolled in the "Diabetic Education Program".  She states understanding of dietary principles and is following her diet appropriately.  No sensory loss is reported.  Self foot exams are not being performed.  She is checking home blood sugars.        Other comments include: pt is checking her blood sugar twice daily.  She did not bring the values with her into the office today.        No changes have been made to her  treatment plan since last visit.    Dyspepsia History:      There is a prior history of GERD.  The patient does not have a prior history of documented ulcer disease.    Hypertension History:      She denies headache, chest pain, and palpitations.  She notes no problems with any antihypertensive medication side effects.        Positive major cardiovascular risk factors include diabetes and hypertension.  Negative major cardiovascular risk factors include female age less than 52 years old.        Further assessment for target organ damage reveals no history of ASHD, cardiac end-organ damage (CHF/LVH), stroke/TIA, peripheral vascular disease, renal insufficiency, or hypertensive retinopathy.      Allergies (verified): 1)  ! Pepcid 2)  ! Demerol  Review of Systems General:  Denies fever. CV:  Denies fatigue. Resp:  Denies cough. GI:  Denies abdominal pain, nausea, and vomiting. Neuro:  Denies headaches. Psych:  Complains of irritability.  Physical Exam  General:  well-developed.   Head:  normocephalic.   Lungs:  normal breath sounds.   Heart:  normal rate and regular rhythm.   Abdomen:  soft, non-tender, and normal bowel sounds.  Msk:  up to the exam table Neurologic:  alert & oriented X3.   Skin:  color normal.   Psych:  moderately anxious.     Impression & Recommendations:  Problem # 1:  DIABETES MELLITUS (ICD-250.00) continue current meds Her updated medication list for this problem includes:    Metformin Hcl 500 Mg Tabs (Metformin hcl) ..... One tablet by mouth two times a day for diabetes    Aspirin 81 Mg Tbec (Aspirin) ..... One tablet by mouth daily  Orders: Capillary Blood Glucose/CBG (82948) Hemoglobin A1C (83036)  Problem # 2:  HYPERTENSION, BENIGN ESSENTIAL (ICD-401.1) DASH diet continue current meds Her updated medication list for this problem includes:    Atenolol 25 Mg Tabs (Atenolol) ..... One tablet by mouth two times a day for blood pressure     Furosemide 20 Mg Tabs (Furosemide) .Marland Kitchen... 2 tablets by mouth daily  Problem # 3:  BIPOLAR AFFECTIVE DISORDER (ICD-296.80) concerned with pt Spoke with Aquilla Solian in office regarding the pt.  She had actually already seen the pt for a consult last week even prior to pt being seen by the provider. Given pt's high level of anxiety, she will attempt to contact the [facility] to see if pt can go in before next scheduled appointment  Complete Medication List: 1)  Metformin Hcl 500 Mg Tabs (Metformin hcl) .... One tablet by mouth two times a day for diabetes 2)  Atenolol 25 Mg Tabs (Atenolol) .... One tablet by mouth two times a day for blood pressure 3)  Lorazepam 0.5 Mg Tabs (Lorazepam) .... One tablet by mouth two times a day 4)  Furosemide 20 Mg Tabs (Furosemide) .... 2 tablets by mouth daily 5)  Omeprazole 20 Mg Tbec (Omeprazole) .... One tablet by mouth two times a day before breakfast and dinner 6)  Seroquel 200 Mg Tabs (Quetiapine fumarate) .... Four tablets by mouth nighty 7)  Seroquel 25 Mg Tabs (Quetiapine fumarate) .... One tablet by mouth two times a day 8)  Aspirin 81 Mg Tbec (Aspirin) .... One tablet by mouth daily 9)  Blood Glucose Meter Kit (Blood glucose monitoring suppl) .... Use to check blood sugar daily before breakfast 10)  Tramadol Hcl 50 Mg Tabs (Tramadol hcl) .... One tablet by mouth daily as needed for pain  Other Orders: T-Basic Metabolic Panel [phone])  Diabetes Management Assessment/Plan:      Her blood pressure goal is < 130/80.    Hypertension Assessment/Plan:      [name] hypertensive risk group is category C: Target organ damage and/or diabetes.  Today's blood pressure is 119/85.  Her blood pressure goal is < 130/80.  Patient Instructions: 1)  Continue to check your blood sugar twice daily. 2)  Take metformin (blood sugar medication) twice daily 3)  Your labs will be checked today for diabetes 4)  Follow up in this office in 4 weeks for  diabetes. 5)  Schedule an appointment with Susie Piper for diabetes.  Laboratory Results   Urine Tests  Date/Time Received: May 04, 2009 11:48 AM  Date/Time Reported: .d  Routine Urinalysis   Color: lt. yellow Appearance: Clear Glucose: negative   (Normal Range: Negative) Bilirubin: negative   (Normal Range: Negative) Ketone: negative   (Normal Range: Negative) Spec. Gravity: 1.010   (Normal Range: 1.003-1.035) Blood: moderate   (Normal Range: Negative) pH: 6.0   (Normal Range: 5.0-8.0) Protein: negative   (Normal Range: Negative) Urobilinogen: 0.2   (Normal Range: 0-1) Nitrite: negative   (  Normal Range: Negative) Leukocyte Esterace: negative   (Normal Range: Negative)     Blood Tests   Date/Time Received: May 04, 2009 10:23 AM  Date/Time Reported: May 04, 2009 10:23 AM   HGBA1C: 6.3%   (Normal Range: Non-Diabetic - 3-6%   Control Diabetic - 6-8%) CBG Random:: 145

## 2010-03-30 NOTE — Assessment & Plan Note (Signed)
Summary: NEW - Establish care   Vital Signs:  Patient profile:   45 year old female Menstrual status:  hysterectomy Height:      63 inches Weight:      231.1 pounds BMI:     41.09 BSA:     2.06 O2 Sat:      97 % on Room air Temp:     97.5 degrees F oral Pulse rate:   78 / minute Pulse rhythm:   regular Resp:     20 per minute BP sitting:   141 / 89  (left arm) Cuff size:   regular  Vitals Entered By: Cheyenne Alvarez (April 06, 2009 11:37 AM)  O2 Flow:  Room air CC: hospital folow-up Cheyenne Alvarez...pt states she is still feeling weak and sore in chest from coughing, Hypertension Management, Abdominal Pain Is Patient Diabetic? Yes Pain Assessment Patient in pain? yes     Location: chest Intensity: 8 Onset of pain  Constant CBG Result 89 CBG Device ID A  Does patient need assistance? Functional Status Self care Ambulation Normal     Menstrual Status hysterectomy   CC:  hospital folow-up Cheyenne Alvarez...pt states she is still feeling weak and sore in chest from coughing, Hypertension Management, and Abdominal Pain.  History of Present Illness: Pt into the office to establish care. Previous PCP at [facility].  Her provider has moved and she needed to find another PCP  Mental health - pt is already established at the [facility].  Her next appointment is in March. Hx of bipolar and anxiety  Diabetes - new Dx while in the hospital No diabetes education given. Pt was started on metformin which he has started taking as ordered  Hospital admission January 25-31, 2011 Dx: multilobar pneumonia shown on CT angiography Started on IV antibiotics and was changed to Avelox.  She was D/C on a d day course however pt presents today with 2 of the 5 pills remaining.  Advised pt to take all these meds.  O2 sats 96% at d/c  Right sided weakness - MRI: no acute findings ASA started  Diabetes [facility] records indicate that pt is a known diabetic that has been diet  controlled. Pt reported no known history of diabetes.  Her Hbga1c = 7 She was started on metoformin 500mg  by mouth two times a day   Pt presents today will all her medications that were prescribed in the hospital. no refills   Dyspepsia History:      The patient does not have a prior history of documented ulcer disease.  The dominant symptom is heartburn or acid reflux.  An H-2 blocker medication is not currently being taken.  She has no history of a positive H. Pylori serology.  No previous upper endoscopy has been done.    Hypertension History:      She denies headache, chest pain, and palpitations.  She notes no problems with any antihypertensive medication side effects.        Positive major cardiovascular risk factors include diabetes and hypertension.  Negative major cardiovascular risk factors include female age less than 53 years old.        Further assessment for target organ damage reveals no history of ASHD, cardiac end-organ damage (CHF/LVH), stroke/TIA, peripheral vascular disease, renal insufficiency, or hypertensive retinopathy.      Past History:  Past Surgical History: Cholecystectomy Hysterectomy  Allergies (verified): 1)  ! Pepcid 2)  ! Demerol  Past History:  Past Surgical History:  Cholecystectomy Hysterectomy  Review of Systems General:  Denies fever. CV:  Denies chest pain or discomfort. Resp:  Complains of cough and shortness of breath; SOB has improved .  Physical Exam  General:  alert.  obese Head:  normocephalic.   Lungs:  normal breath sounds.   Heart:  normal rate and regular rhythm.   Abdomen:  normal bowel sounds.   Msk:  up to the exam table Neurologic:  alert & oriented X3.    Diabetes Management Exam:    Foot Exam (with socks and/or shoes not present):       Sensory-Monofilament:          Left foot: normal          Right foot: normal   Impression & Recommendations:  Problem # 1:  PNEUMONIA (ICD-486) improving pt advised to  finish the AVELOX as ordered per the hospital. She has 2 more pills left Orders: Pulse Oximetry (single measurment) (16109)  Problem # 2:  DIABETES MELLITUS (ICD-250.00) pt advised to come back for diabetes education will continue metformin glucometer ordered Her updated medication list for this problem includes:    Metformin Hcl 500 Mg Tabs (Metformin hcl) ..... One tablet by mouth two times a day for diabetes    Aspirin 81 Mg Tbec (Aspirin) ..... One tablet by mouth daily  Orders: Capillary Blood Glucose/CBG (60454) UA Dipstick w/o Micro (manual) (09811)  Problem # 3:  GERD (ICD-530.81)  Her updated medication list for this problem includes:    Omeprazole 20 Mg Tbec (Omeprazole) ..... One tablet by mouth two times a day before breakfast and dinner  Problem # 4:  HYPERTENSION, BENIGN ESSENTIAL (ICD-401.1) DASH diet  elevated today and if still elevated on the next visit will increase meds Her updated medication list for this problem includes:    Atenolol 25 Mg Tabs (Atenolol) ..... One tablet by mouth two times a day for blood pressure    Furosemide 20 Mg Tabs (Furosemide) .Marland Kitchen... 2 tablets by mouth daily  Orders: T-Urine Microalbumin w/creat. ratio [phone])  Complete Medication List: 1)  Metformin Hcl 500 Mg Tabs (Metformin hcl) .... One tablet by mouth two times a day for diabetes 2)  Atenolol 25 Mg Tabs (Atenolol) .... One tablet by mouth two times a day for blood pressure 3)  Ambien 5 Mg Tabs (Zolpidem tartrate) .... One tablet nightly as needed for sleet 4)  Lorazepam 0.5 Mg Tabs (Lorazepam) .... One tablet by mouth two times a day 5)  Furosemide 20 Mg Tabs (Furosemide) .... 2 tablets by mouth daily 6)  Omeprazole 20 Mg Tbec (Omeprazole) .... One tablet by mouth two times a day before breakfast and dinner 7)  Seroquel 200 Mg Tabs (Quetiapine fumarate) .... Four tablets by mouth nighty 8)  Seroquel 25 Mg Tabs (Quetiapine fumarate) .... One tablet by mouth two times  a day 9)  Aspirin 81 Mg Tbec (Aspirin) .... One tablet by mouth daily 10)  Acetaminophen 325 Mg Tabs (Acetaminophen) .... 2 tablets every 4 hours as needed for pain 11)  Blood Glucose Meter Kit (Blood glucose monitoring suppl) .... Use to check blood sugar daily before breakfast 12)  Tramadol Hcl 50 Mg Tabs (Tramadol hcl) .... One tablet by mouth daily as needed for pain 13)  Mucinex 600 Mg Xr12h-tab (Guaifenesin) .... One tablet by mouth two times a day as needed for cough  Hypertension Assessment/Plan:      [name] hypertensive risk group is category C: Target organ damage and/or  diabetes.  Today's blood pressure is 141/89.  Her blood pressure goal is < 130/80.  Patient Instructions: 1)  Follow up in 4 weeks for diabetes. 2)  will need cbg, u/a. 3)  Schedule an appointment with susie piper for diabetes education 4)  Get the blood glucose machine to check your blood sugar daily before breakfast Prescriptions: MUCINEX 600 MG XR12H-TAB (GUAIFENESIN) One tablet by mouth two times a day as needed for cough  #20 x 0   Entered and Authorized by:   Lehman Prom FNP   Signed by:   Lehman Prom FNP on 04/06/2009   Method used:   Print then Give to Patient   RxID:   [id number] TRAMADOL HCL 50 MG TABS (TRAMADOL HCL) One tablet by mouth daily as needed for pain  #30 x 0   Entered and Authorized by:   Lehman Prom FNP   Signed by:   Lehman Prom FNP on 04/06/2009   Method used:   Print then Give to Patient   RxID:   [id number] BLOOD GLUCOSE METER  KIT (BLOOD GLUCOSE MONITORING SUPPL) Use to check blood sugar daily before breakfast  #1 meter x 0   Entered and Authorized by:   Lehman Prom FNP   Signed by:   Lehman Prom FNP on 04/06/2009   Method used:   Print then Give to Patient   RxID:   [id number] OMEPRAZOLE 20 MG TBEC (OMEPRAZOLE) One tablet by mouth two times a day before breakfast and dinner  #60 x 1   Entered and Authorized by:   Lehman Prom  FNP   Signed by:   Lehman Prom FNP on 04/06/2009   Method used:   Print then Give to Patient   RxID:   [id number] FUROSEMIDE 20 MG TABS (FUROSEMIDE) 2 tablets by mouth daily  #60 x 1   Entered and Authorized by:   Lehman Prom FNP   Signed by:   Lehman Prom FNP on 04/06/2009   Method used:   Print then Give to Patient   RxID:   [id number] ATENOLOL 25 MG TABS (ATENOLOL) One tablet by mouth two times a day for blood pressure  #60 x 1   Entered and Authorized by:   Lehman Prom FNP   Signed by:   Lehman Prom FNP on 04/06/2009   Method used:   Print then Give to Patient   RxID:   [id number] METFORMIN HCL 500 MG TABS (METFORMIN HCL) One tablet by mouth two times a day for diabetes  #60 x 1   Entered and Authorized by:   Lehman Prom FNP   Signed by:   Lehman Prom FNP on 04/06/2009   Method used:   Print then Give to Patient   RxID:   [id number]          Diabetic Foot Exam    10-g (5.07) Semmes-Weinstein Monofilament Test Performed by: Cheyenne Alvarez          Right Foot          Left Foot Visual Inspection               Test Control      normal         normal Site 1         normal         normal Site 2         normal         normal Site 3  normal         normal Site 4         normal         normal Site 5         normal         normal Site 6         normal         normal Site 7         normal         normal Site 8         normal         normal Site 9         normal         normal Site 10         normal         normal  Impression      normal         normal   Laboratory Results   Urine Tests  Date/Time Received: April 06, 2009 11:53 AM   Routine Urinalysis   Color: lt. yellow Appearance: Clear Glucose: negative   (Normal Range: Negative) Bilirubin: negative   (Normal Range: Negative) Ketone: negative   (Normal Range: Negative) Spec. Gravity: >=1.030   (Normal Range: 1.003-1.035) Blood: moderate   (Normal  Range: Negative) pH: 5.5   (Normal Range: 5.0-8.0) Protein: negative   (Normal Range: Negative) Urobilinogen: 0.2   (Normal Range: 0-1) Nitrite: negative   (Normal Range: Negative) Leukocyte Esterace: negative   (Normal Range: Negative)     Blood Tests     CBG Random:: 89

## 2010-03-30 NOTE — Assessment & Plan Note (Signed)
Summary: Complete Physical Exam   Vital Signs:  Patient profile:   45 year old female Menstrual status:  hysterectomy Weight:      241.7 pounds BMI:     42.97 BSA:     2.10 O2 Sat:      99 % on Room air Temp:     97.8 degrees F oral Pulse rate:   83 / minute Pulse rhythm:   regular Resp:     20 per minute BP sitting:   133 / 91  (left arm) Cuff size:   large  Vitals Entered By: Levon Hedger (August 03, 2009 10:52 AM)  Nutrition Counseling: Patient's BMI is greater than 25 and therefore counseled on weight management options.  O2 Flow:  Room air CC: CPP...pt state breathing is not good and it bothers her especially at night...pt has been having alot of muscle spasms since starting Abilify, Hypertension Management Is Patient Diabetic? Yes Pain Assessment Patient in pain? no      CBG Result 127 CBG Device ID A  Does patient need assistance? Ambulation Normal   CC:  CPP...pt state breathing is not good and it bothers her especially at night...pt has been having alot of muscle spasms since starting Abilify and Hypertension Management.  History of Present Illness:  Pt into the office for a complete physical exam  PAP - last done several years ago No family with cervical or ovarian cancer S/p partial hysterectomy in 1996  Mammogram - last done 3 years ago Maternal grandmother with breast cancer  optho - No current eye exam, wears reading glasses  Dental - no recent eye exam  tdap - last done 1 year ago  Pt presents today with significant other  Diabetes Management History:      The patient is a 45 years old female who comes in for evaluation of DM Type 2.  She has not been enrolled in the "Diabetic Education Program".  She states understanding of dietary principles and is following her diet appropriately.  No sensory loss is reported.  Self foot exams are not being performed.  She is checking home blood sugars.        Hypoglycemic symptoms are not occurring.  No  hyperglycemic symptoms are reported.  Other comments include: elevated blood sugars at home but pt is drinking regular sodas and eating what she wants.        No changes have been made to her treatment plan since last visit.    Hypertension History:      She denies headache, chest pain, and palpitations.  She notes no problems with any antihypertensive medication side effects.        Positive major cardiovascular risk factors include diabetes, hypertension, and current tobacco user.  Negative major cardiovascular risk factors include female age less than 97 years old.        Further assessment for target organ damage reveals no history of ASHD, cardiac end-organ damage (CHF/LVH), stroke/TIA, peripheral vascular disease, renal insufficiency, or hypertensive retinopathy.     Habits & Providers  Alcohol-Tobacco-Diet     Alcohol drinks/day: <1     Tobacco Status: current     Tobacco Counseling: to quit use of tobacco products     Cigarette Packs/Day: 2.0  Exercise-Depression-Behavior     Have you felt down or hopeless? yes     Have you felt little pleasure in things? yes     Depression Counseling: further diagnostic testing and/or other treatment is indicated  Drug Use: never  Comments: PHQ-9 score = 25  Allergies (verified): 1)  ! Pepcid 2)  ! Demerol  Social History: Smoking Status:  current Packs/Day:  2.0 Drug Use:  never  Review of Systems General:  taste buds . Eyes:  Denies blurring. ENT:  Denies earache. CV:  Complains of swelling of feet; denies chest pain or discomfort. Resp:  Complains of shortness of breath; denies cough; with very little exertion. GI:  Denies abdominal pain, nausea, and vomiting. GU:  Denies discharge. MS:  Denies joint pain. Derm:  Denies rash. Neuro:  Complains of difficulty with concentration. Psych:  Complains of anxiety and depression. Endo:  Denies excessive thirst and excessive urination.  Physical Exam  General:  alert.   Head:   normocephalic.   Eyes:  pupils round.   Ears:  bil TM with bony landmarks present minimal cerumen Nose:  no nasal discharge.   Mouth:  tongue - white exudate on tongue dentures Neck:  supple.   Chest Wall:  no mass.   Breasts:  skin/areolae normal, no masses, and no abnormal thickening.   Lungs:  normal breath sounds.   Heart:  normal rate and regular rhythm.   Abdomen:  soft, non-tender, and normal bowel sounds.   Rectal:  no masses.   Msk:  normal ROM.   Extremities:  trace left pedal edema and trace right pedal edema.   Neurologic:  alert & oriented X3.   Skin:  color normal.   Psych:  slightly anxious, easily distracted, and poor concentration.    Pelvic Exam  Vulva:      normal appearance.   Urethra and Bladder:      Urethra--normal.   Vagina:      physiologic discharge.   Cervix:      absent Adnexa:      nontender bilaterally.   Rectum:      normal, heme negative stool.      Impression & Recommendations:  Problem # 1:  ROUTINE GYNECOLOGICAL EXAMINATION (ICD-V72.31) labs done PAP done PHQ-9 score = 25 EKG done rec optho and dental exam Orders: Hemoccult Guaiac-1 spec.(in office) (82270) Rapid HIV  (92370) KOH/ WET Mount [phone]) Pap Smear, Thin Prep ( Collection of) (Q0091) T- GC Chlamydia (60454)  Problem # 2:  UNSPECIFIED BREAST SCREENING (ICD-V76.10) self breast exam reviewed mammogram scheduled Orders: Mammogram (Screening) (Mammo)  Problem # 3:  TOBACCO ABUSE (ICD-305.1) pt smoking 2 ppd advised pt that she will need to decrease amount of cigs  Problem # 4:  MICROSCOPIC HEMATURIA (ICD-599.72)  Orders: T-Culture, Urine (09811-91478)  Problem # 5:  CANDIDIASIS, ORAL (ICD-112.0) will treat with nystatin  Problem # 6:  OBESITY (ICD-278.00) pt needs to incorporate some exercise into her routine Orders: T-TSH (29562-13086) T-Urine Microalbumin w/creat. ratio [id number]  Complete Medication List: 1)  Metformin Hcl 500 Mg Tabs  (Metformin hcl) .... One tablet by mouth two times a day for diabetes 2)  Atenolol 25 Mg Tabs (Atenolol) .... One tablet by mouth two times a day for blood pressure 3)  Lorazepam 0.5 Mg Tabs (Lorazepam) .... One tablet by mouth two times a day 4)  Furosemide 20 Mg Tabs (Furosemide) .... 2 tablets by mouth daily 5)  Omeprazole 20 Mg Tbec (Omeprazole) .... One tablet by mouth two times a day before breakfast and dinner 6)  Seroquel 200 Mg Tabs (Quetiapine fumarate) .... Four tablets by mouth nighty 7)  Seroquel 25 Mg Tabs (Quetiapine fumarate) .... One tablet by mouth two times a  day 8)  Aspirin 81 Mg Tbec (Aspirin) .... One tablet by mouth daily 9)  Blood Glucose Meter Kit (Blood glucose monitoring suppl) .... Use to check blood sugar daily before breakfast 10)  Tramadol Hcl 50 Mg Tabs (Tramadol hcl) .... One tablet by mouth daily as needed for pain 11)  Fluoxetine Hcl 20 Mg Caps (Fluoxetine hcl) .... 2 capsules by mouth daily **rx by psych*8 12)  Abilify 15 Mg Tabs (Aripiprazole) .... Rx by [facility] 13)  Nystatin 100000 Unit/ml Susp (Nystatin) .... 5ml by mouth three times a day before meals swish and swallow 14)  Glucotrol Xl 5 Mg Xr24h-tab (Glipizide) .... One tablet by mouth daily for blood sugar 15)  Naprosyn 500 Mg Tabs (Naproxen) .... One tablet by mouth daily as needed for leg pain  Other Orders: T-Lipid Profile (57846-96295) T-Comprehensive Metabolic Panel [phone]) T-CBC w/Diff (02725-36644) UA Dipstick w/o Micro (manual) (03474) Capillary Blood Glucose/CBG (82948) Hemoglobin A1C (25956)  Diabetes Management Assessment/Plan:      Her blood pressure goal is < 130/80.    Hypertension Assessment/Plan:      [name] hypertensive risk group is category C: Target organ damage and/or diabetes.  Today's blood pressure is 133/91.  Her blood pressure goal is < 130/80.  Patient Instructions: 1)  Weight - Food intake and sodas are playing a large part on your weight  gain. 2)  Swelling - may improve as your taper off the seroquel.   3)  Diabetes - Your Hgba1c = 7.0 so overall your blood sugar is maintained.  However, drinking soda is NOT good for diabetes.  And with blood sugars in the 400's this does not help your kidneys. 4)  Will start glucotrol 5mg  - one tablet by mouth daily 5)  Shortness of breath - combination of weight gain and smoking.  Decrease your smoking. Try to at least cut back to 1 pack per day 6)  Decreased taste - you have yeast on your tongue.  This is likely why you are having trouble tasting food.  Will order nystatin swish and spit 5ml by mouth three times a day BEFORE meals 7)  Keep appointment for mammogram 8)  Follow up in 2 months with n.martin,fnp for diabetes Prescriptions: NAPROSYN 500 MG TABS (NAPROXEN) One tablet by mouth daily as needed for leg pain  #30 x 0   Entered and Authorized by:   Lehman Prom FNP   Signed by:   Lehman Prom FNP on 08/03/2009   Method used:   Print then Give to Patient   RxID:   [id number] GLUCOTROL XL 5 MG XR24H-TAB (GLIPIZIDE) One tablet by mouth daily for blood sugar  #30 x 5   Entered and Authorized by:   Lehman Prom FNP   Signed by:   Lehman Prom FNP on 08/03/2009   Method used:   Print then Give to Patient   RxID:   [id number] BLOOD GLUCOSE METER  KIT (BLOOD GLUCOSE MONITORING SUPPL) Use to check blood sugar daily before breakfast  #1 meter x 0   Entered and Authorized by:   Lehman Prom FNP   Signed by:   Lehman Prom FNP on 08/03/2009   Method used:   Print then Give to Patient   RxID:   [id number] ASPIRIN 81 MG TBEC (ASPIRIN) One tablet by mouth daily  #30 x 5   Entered and Authorized by:   Lehman Prom FNP   Signed by:   Lehman Prom FNP on 08/03/2009   Method used:  Print then Give to Patient   RxID:   [id number] OMEPRAZOLE 20 MG TBEC (OMEPRAZOLE) One tablet by mouth two times a day before breakfast and dinner  #60 x 5   Entered and  Authorized by:   Lehman Prom FNP   Signed by:   Lehman Prom FNP on 08/03/2009   Method used:   Print then Give to Patient   RxID:   [id number] FUROSEMIDE 20 MG TABS (FUROSEMIDE) 2 tablets by mouth daily  #60 x 5   Entered and Authorized by:   Lehman Prom FNP   Signed by:   Lehman Prom FNP on 08/03/2009   Method used:   Print then Give to Patient   RxID:   [id number] ATENOLOL 25 MG TABS (ATENOLOL) One tablet by mouth two times a day for blood pressure  #60 x 5   Entered and Authorized by:   Lehman Prom FNP   Signed by:   Lehman Prom FNP on 08/03/2009   Method used:   Print then Give to Patient   RxID:   [id number] METFORMIN HCL 500 MG TABS (METFORMIN HCL) One tablet by mouth two times a day for diabetes  #60 x 5   Entered and Authorized by:   Lehman Prom FNP   Signed by:   Lehman Prom FNP on 08/03/2009   Method used:   Print then Give to Patient   RxID:   [id number] NYSTATIN 100000 UNIT/ML SUSP (NYSTATIN) 5ml by mouth three times a day before meals swish and swallow  #467ml x 1   Entered and Authorized by:   Lehman Prom FNP   Signed by:   Lehman Prom FNP on 08/03/2009   Method used:   Print then Give to Patient   RxID:   [id number]   Laboratory Results   Urine Tests  Date/Time Received: August 03, 2009 12:08 PM   Routine Urinalysis   Color: lt. yellow Glucose: negative   (Normal Range: Negative) Bilirubin: negative   (Normal Range: Negative) Ketone: negative   (Normal Range: Negative) Spec. Gravity: >=1.030   (Normal Range: 1.003-1.035) Blood: large   (Normal Range: Negative) pH: 5.5   (Normal Range: 5.0-8.0) Protein: negative   (Normal Range: Negative) Urobilinogen: 1.0   (Normal Range: 0-1) Nitrite: negative   (Normal Range: Negative) Leukocyte Esterace: trace   (Normal Range: Negative)     Blood Tests   Date/Time Received: August 03, 2009 11:17 AM   HGBA1C: 7.0%   (Normal Range: Non-Diabetic -  3-6%   Control Diabetic - 6-8%) CBG Random:: 127  Date/Time Received: August 03, 2009   Wet Mount/KOH Source: vaginal WBC/hpf: 1-5 Bacteria/hpf: rare Clue cells/hpf: none Yeast/hpf: none Trichomonas/hpf: none  Stool - Occult Blood Hemmoccult #1: negative Date: 08/03/2009    Laboratory Results   Urine Tests    Routine Urinalysis   Color: lt. yellow Glucose: negative   (Normal Range: Negative) Bilirubin: negative   (Normal Range: Negative) Ketone: negative   (Normal Range: Negative) Spec. Gravity: >=1.030   (Normal Range: 1.003-1.035) Blood: large   (Normal Range: Negative) pH: 5.5   (Normal Range: 5.0-8.0) Protein: negative   (Normal Range: Negative) Urobilinogen: 1.0   (Normal Range: 0-1) Nitrite: negative   (Normal Range: Negative) Leukocyte Esterace: trace   (Normal Range: Negative)     Blood Tests     HGBA1C: 7.0%   (Normal Range: Non-Diabetic - 3-6%   Control Diabetic - 6-8%) CBG Random:: 127mg /dL    DIRECTV KOH:  Negative  Stool - Occult Blood Hemmoccult #1: negative    EKG  Procedure date:  08/03/2009  Findings:      normal:  82    Appended Document: Complete Physical Exam  Laboratory Results    Other Tests  Rapid HIV: negative

## 2010-03-30 NOTE — Progress Notes (Signed)
Summary: MEDS REFILL  Phone Note Refill Request Call back at [phone]   Refills Requested: Medication #1:  GLUCOTROL XL 5 MG XR24H-TAB One tablet by mouth daily for blood sugar  Medication #2:  METFORMIN HCL 500 MG TABS One tablet by mouth two times a day for diabetes  Medication #3:  ATENOLOL 25 MG TABS One tablet by mouth two times a day for blood pressure  Medication #4:  ASPIRIN 81 MG TBEC One tablet by mouth daily ONE TOUCH, NEEDLES ,AND I NEED AL MY REFILLS , FAXED TO [facility] [address]   Initial call taken by: Domenic Polite,  December 23, 2009 11:29 AM  Follow-up for Phone Call        Needs Rx for One-touch strips and lancets.  Dutch Quint RN  December 23, 2009 12:04 PM   Additional Follow-up for Phone Call Additional follow up Details #1::        Does the pt have medicaid? If not, she will likely not be able to afford the strips and lancets but i will order. All other requested Rx sent electronically as well. ASA is over the counter. Additional Follow-up by: Lehman Prom FNP,  December 23, 2009 12:07 PM    Additional Follow-up for Phone Call Additional follow up Details #2::    Pt. notified of refills -- she does have Medicaid.  Dutch Quint RN  December 23, 2009 12:24 PM   New/Updated Medications: ONETOUCH LANCETS  MISC (LANCETS) Use to check blood sugar daily before breakfast ONETOUCH TEST  STRP (GLUCOSE BLOOD) Use to test blood sugar daily before breakfast Prescriptions: GLUCOTROL XL 5 MG XR24H-TAB (GLIPIZIDE) One tablet by mouth daily for blood sugar  #30 x 5   Entered and Authorized by:   Lehman Prom FNP   Signed by:   Lehman Prom FNP on 12/23/2009   Method used:   Electronically to        [facility] [phone]* (retail)       [address]       [place], [place]  [address]       Ph: [phone]       Fax: [phone]   RxID:   [id number] ATENOLOL 25 MG TABS (ATENOLOL) One tablet by mouth two times a day for blood pressure  #60 x 5  Entered and Authorized by:   Lehman Prom FNP   Signed by:   Lehman Prom FNP on 12/23/2009   Method used:   Electronically to        [facility] [phone]* (retail)       [address]       [place], [place]  [address]       Ph: [phone]       Fax: [phone]   RxID:   [id number] METFORMIN HCL 500 MG TABS (METFORMIN HCL) One tablet by mouth two times a day for diabetes  #60 x 5   Entered and Authorized by:   Lehman Prom FNP   Signed by:   Lehman Prom FNP on 12/23/2009   Method used:   Electronically to        [facility] [phone]* (retail)       [address]       [place], [place]  [address]       Ph: [phone]       Fax: [phone]   RxID:   [id number] ONETOUCH TEST  STRP (GLUCOSE BLOOD) Use to test blood sugar daily before breakfast  #  100 x 5   Entered and Authorized by:   Lehman Prom FNP   Signed by:   Lehman Prom FNP on 12/23/2009   Method used:   Electronically to        [facility] [phone]* (retail)       [address]       [place], [place]  [address]       Ph: [phone]       Fax: [phone]   RxID:   [id number] [facility] LANCETS  MISC (LANCETS) Use to check blood sugar daily before breakfast  #100 x 5   Entered and Authorized by:   Lehman Prom FNP   Signed by:   Lehman Prom FNP on 12/23/2009   Method used:   Electronically to        [facility] [phone]* (retail)       [address]       [place], [place]  [address]       Ph: [phone]       Fax: [phone]   RxID:   [id number]

## 2010-04-01 NOTE — Progress Notes (Signed)
Summary: Needs strips  Phone Note Call from Patient   Summary of Call: [facility]/  [facility] ,but needs the strips //fast!// she fills like she needs to check her sugar /she not feeling well,t her mouth is very dry/  [facility] [address]// phone number on file /she was very agitated Initial call taken by: Arta Bruce,  March 26, 2010 8:42 AM  Follow-up for Phone Call        I've spoken with pharmacy- pharmacy stated denied due to glucometer and supplies not being covered under Medicaid.  Glucometer and supplies Rx'd are approved per list -- spoke with Shawna Orleans at [facility] -- apparently it's a pharmacy error in coding.  Shawna Orleans is going to call pharmacy to clarify correct input and I will follow-up with pharmacy.  Pt. notified of approval in process. Follow-up by: Dutch Quint RN,  March 26, 2010 10:44 AM  Additional Follow-up for Phone Call Additional follow up Details #1::        Spoke with pharmacy -- meter not in stock, is ordered, will be in Monday.  Left message on answering machine for pt. to return call.  Dutch Quint RN  March 26, 2010 3:56 PM  Pt. advised of meter on order - instructed to check with pharmacy on Monday.  She will have her CBGs check on a family member's meter until then.  To call back if there are any further problems.  Verbalized understanding and agreement. Additional Follow-up by: Dutch Quint RN,  March 26, 2010 4:14 PM

## 2010-04-01 NOTE — Assessment & Plan Note (Signed)
Summary: Diabetes   Vital Signs:  Patient profile:   45 year old female Menstrual status:  hysterectomy Weight:      246.9 pounds BMI:     43.89 Temp:     98.3 degrees F oral Pulse rate:   60 / minute Pulse rhythm:   regular Resp:     16 per minute BP sitting:   142 / 90  (left arm) Cuff size:   large  Vitals Entered By: Levon Hedger (March 25, 2010 11:47 AM)  Nutrition Counseling: Patient's BMI is greater than 25 and therefore counseled on weight management options. CC: urgent care visit [facility]...anxiety issues , Hypertension Management, Abdominal Pain Is Patient Diabetic? Yes CBG Result 232 CBG Device ID B  Does patient need assistance? Functional Status Self care Ambulation Normal   CC:  urgent care visit [facility]...anxiety issues , Hypertension Management, and Abdominal Pain.  History of Present Illness:  Pt into the office for chronic issues She has parted ways with her partner. She reports that she has been in behavioral health and Urgent care since her last visit here  Depression - Godmother has passed.  She contacted the [facility] and she was given refills on all her psych meds.  Diabetes Management History:      The patient is a 45 years old female who comes in for evaluation of DM Type 2.  She has not been enrolled in the "Diabetic Education Program".  She states lack of understanding of dietary principles and is not following her diet appropriately.  No sensory loss is reported.  Self foot exams are not being performed.  She is checking home blood sugars.  She says that she is not exercising regularly.        Other comments include: pt has not taken her diabetes medications because she has been withtout.        No changes have been made to her treatment plan since last visit.    Dyspepsia History:      She has no alarm features of dyspepsia including no history of melena, hematochezia, dysphagia, persistent vomiting, or involuntary weight loss > 5%.   There is a prior history of GERD.  The patient does not have a prior history of documented ulcer disease.  An H-2 blocker medication is not currently being taken.    Hypertension History:      She denies headache, chest pain, and palpitations.  pt has been without her medications for the past 2-3 months.        Positive major cardiovascular risk factors include diabetes and hypertension.  Negative major cardiovascular risk factors include female age less than 14 years old and non-tobacco-user status.        Further assessment for target organ damage reveals no history of ASHD, cardiac end-organ damage (CHF/LVH), stroke/TIA, peripheral vascular disease, renal insufficiency, or hypertensive retinopathy.      Habits & Providers  Alcohol-Tobacco-Diet     Alcohol drinks/day: <1     Tobacco Status: quit < 6 months     Tobacco Counseling: to quit use of tobacco products     Year Quit: 02/2010  Exercise-Depression-Behavior     Does Patient Exercise: no     Depression Counseling: further diagnostic testing and/or other treatment is indicated     Drug Use: never  Medications Prior to Update: 1)  Metformin Hcl 500 Mg Tabs (Metformin Hcl) .... One Tablet By Mouth Two Times A Day For Diabetes 2)  Atenolol 25 Mg Tabs (  Atenolol) .... One Tablet By Mouth Two Times A Day For Blood Pressure 3)  Lorazepam 0.5 Mg Tabs (Lorazepam) .... One Tablet By Mouth Two Times A Day 4)  Furosemide 20 Mg Tabs (Furosemide) .... 2 Tablets By Mouth Daily 5)  Protonix 40 Mg Tbec (Pantoprazole Sodium) .... One Tablet By Mouth Two Times A Day For Stomach 6)  Aspirin 81 Mg Tbec (Aspirin) .... One Tablet By Mouth Daily 7)  Blood Glucose Meter  Kit (Blood Glucose Monitoring Suppl) .... Use To Check Blood Sugar Daily Before Breakfast 8)  Fluoxetine Hcl 20 Mg Caps (Fluoxetine Hcl) .... 2 Capsules By Mouth Daily **rx By Psych*8 9)  Glucotrol Xl 5 Mg Xr24h-Tab (Glipizide) .... One Tablet By Mouth Daily For Blood Sugar 10)  Naprosyn  500 Mg Tabs (Naproxen) .... One Tablet By Mouth Daily As Needed For Leg Pain 11)  Ferrous Sulfate 325 (65 Fe) Mg Tabs (Ferrous Sulfate) .... One Tablet By Mouth Daily 12)  Zolpidem Tartrate 10 Mg Tabs (Zolpidem Tartrate) .... One Tablet By Mouth Nightly  **rx By Psych** 13)  Risperidone 3 Mg Tabs (Risperidone) .... 1/2 Tablet By Mouth At Bedtime **rx By Psych** 14)  Hydroxyzine Hcl 50 Mg Tabs (Hydroxyzine Hcl) .... One or Two Tablets By Mouth At Bedtime **rx By Psych** 15)  Onetouch Lancets  Misc (Lancets) .... Use To Check Blood Sugar Daily Before Breakfast 16)  Onetouch Test  Strp (Glucose Blood) .... Use To Test Blood Sugar Daily Before Breakfast  Allergies (verified): 1)  ! Pepcid 2)  ! Demerol  Social History: Smoking Status:  quit < 6 months  Review of Systems CV:  Denies chest pain or discomfort. Resp:  Denies cough. GI:  Complains of indigestion; denies abdominal pain, nausea, and vomiting. MS:  Complains of cramps.  Physical Exam  General:  alert.   Head:  normocephalic.   Lungs:  few scattered wheezes Heart:  normal rate and regular rhythm.   Abdomen:  normal bowel sounds.   Msk:  up to the exam table Neurologic:  alert & oriented X3.   Skin:  color normal.   Psych:  rocking back and forthOriented X3 and slightly anxious.    Diabetes Management Exam:       Nails:          Left foot: normal          Right foot: normal   Impression & Recommendations:  Problem # 1:  HYPERTENSION, BENIGN ESSENTIAL (ICD-401.1) BP elevated but pt has been off her meds DASH diet will restart Her updated medication list for this problem includes:    Atenolol 25 Mg Tabs (Atenolol) ..... One tablet by mouth two times a day for blood pressure    Furosemide 20 Mg Tabs (Furosemide) .Marland Kitchen... 2 tablets by mouth daily  Problem # 2:  DIABETES MELLITUS (ICD-250.00) BP elevated but likewise pt has been off meds Her updated medication list for this problem includes:    Metformin Hcl 500 Mg Tabs  (Metformin hcl) ..... One tablet by mouth two times a day for diabetes    Aspirin 81 Mg Tbec (Aspirin) ..... One tablet by mouth daily    Glucotrol Xl 5 Mg Xr24h-tab (Glipizide) ..... One tablet by mouth daily for blood sugar  Orders: Capillary Blood Glucose/CBG (82948) Hemoglobin A1C (62952)  Problem # 3:  LEG CRAMPS (ICD-729.82) ? neuropathy will start gabapentin  Problem # 4:  GERD (ICD-530.81)  Her updated medication list for this problem includes:    Nexium  40 Mg Cpdr (Esomeprazole magnesium) ..... One tablet by mouth daily before breakfast  Problem # 5:  TOBACCO ABUSE (ICD-305.1) pt has quit. congrats to pt and keep up the good work  Complete Medication List: 1)  Metformin Hcl 500 Mg Tabs (Metformin hcl) .... One tablet by mouth two times a day for diabetes 2)  Atenolol 25 Mg Tabs (Atenolol) .... One tablet by mouth two times a day for blood pressure 3)  Lorazepam 0.5 Mg Tabs (Lorazepam) .... One tablet by mouth two times a day 4)  Furosemide 20 Mg Tabs (Furosemide) .... 2 tablets by mouth daily 5)  Nexium 40 Mg Cpdr (Esomeprazole magnesium) .... One tablet by mouth daily before breakfast 6)  Aspirin 81 Mg Tbec (Aspirin) .... One tablet by mouth daily 7)  Blood Glucose Meter Kit (Blood glucose monitoring suppl) .... Use to check blood sugar daily before breakfast 8)  Fluoxetine Hcl 20 Mg Caps (Fluoxetine hcl) .... 2 capsules by mouth daily **rx by psych*8 9)  Glucotrol Xl 5 Mg Xr24h-tab (Glipizide) .... One tablet by mouth daily for blood sugar 10)  Naprosyn 500 Mg Tabs (Naproxen) .... One tablet by mouth daily as needed for leg pain 11)  Ferrous Sulfate 325 (65 Fe) Mg Tabs (Ferrous sulfate) .... One tablet by mouth daily 12)  Zolpidem Tartrate 10 Mg Tabs (Zolpidem tartrate) .... One tablet by mouth nightly  **rx by psych** 13)  Risperidone 3 Mg Tabs (Risperidone) .... 1/2 tablet by mouth at bedtime **rx by psych** 14)  Hydroxyzine Hcl 50 Mg Tabs (Hydroxyzine hcl) .... One or  two tablets by mouth at bedtime **rx by psych** 15)  Onetouch Lancets Misc (Lancets) .... Use to check blood sugar daily before breakfast 16)  Onetouch Test Strp (Glucose blood) .... Use to test blood sugar daily before breakfast 17)  Depakote Er 500 Mg Xr24h-tab (Divalproex sodium) .... 2 tablets by mouth nightly 18)  Benztropine Mesylate 1 Mg Tabs (Benztropine mesylate) .... One tablet by mouth two times a day 19)  Accu-chek Aviva Kit (Blood glucose monitoring suppl) .... Use to check blood sugar daily 20)  Accu-chek Aviva Strp (Glucose blood) .... Use toi check blood sugar daily for diabetes 21)  Accu-chek Softclix Lancets Misc (Lancets) .... Use to check blood sugar daily 22)  Gabapentin 300 Mg Caps (Gabapentin) .... One capsule by mouth nightly for cramps  Diabetes Management Assessment/Plan:      Her blood pressure goal is < 130/80.    Hypertension Assessment/Plan:      [name] hypertensive risk group is category C: Target organ damage and/or diabetes.  Her calculated 10 year risk of coronary heart disease is 8 %.  Today's blood pressure is 142/90.  Her blood pressure goal is < 130/80.  Patient Instructions: 1)  Diabetes - Your blood sugar medication will be sent to the pharmacy.  Your blood sugar is high today.  Get the new blood sugar meter to check your blood sugar daily. 2)  Blood pressure - High today but you have been without your medications.  Your blood pressure medications have been sent to the pharmacy. 3)  Follow up in this office in 4 weeks for diabetes 4)  You will need cbg, u/a. Prescriptions: GABAPENTIN 300 MG CAPS (GABAPENTIN) One capsule by mouth nightly for cramps  #30 x 11   Entered and Authorized by:   Lehman Prom FNP   Signed by:   Lehman Prom FNP on 03/25/2010   Method used:   Electronically to  [facility] [address] [phone]* (retail)       [address]       [place], [place]  [address]       Ph: [phone]       Fax: [phone]   RxID:    [id number] ACCU-CHEK SOFTCLIX LANCETS  MISC (LANCETS) Use to check blood sugar daily  #50 x 11   Entered and Authorized by:   Lehman Prom FNP   Signed by:   Lehman Prom FNP on 03/25/2010   Method used:   Electronically to        [facility] [phone]* (retail)       [address]       [place], [place]  [address]       Ph: [phone]       Fax: [phone]   RxID:   [id number] ACCU-CHEK AVIVA  STRP (GLUCOSE BLOOD) Use toi check blood sugar daily for diabetes  #50 x 11   Entered and Authorized by:   Lehman Prom FNP   Signed by:   Lehman Prom FNP on 03/25/2010   Method used:   Electronically to        [facility] [phone]* (retail)       [address]       [place], [place]  [address]       Ph: [phone]       Fax: [phone]   RxID:   [id number] ACCU-CHEK AVIVA  KIT (BLOOD GLUCOSE MONITORING SUPPL) Use to check blood sugar daily  #1 meter x 0   Entered and Authorized by:   Lehman Prom FNP   Signed by:   Lehman Prom FNP on 03/25/2010   Method used:   Electronically to        [facility] [phone]* (retail)       [address]       [place], [place]  [address]       Ph: [phone]       Fax: [phone]   RxID:   [id number] NAPROSYN 500 MG TABS (NAPROXEN) One tablet by mouth daily as needed for leg pain  #30 x 1   Entered and Authorized by:   Lehman Prom FNP   Signed by:   Lehman Prom FNP on 03/25/2010   Method used:   Electronically to        [facility] [phone]* (retail)       [address]       [place], [place]  [address]       Ph: [phone]       Fax: [phone]   RxID:   [id number] GLUCOTROL XL 5 MG XR24H-TAB (GLIPIZIDE) One tablet by mouth daily for blood sugar  #30 x 11   Entered and Authorized by:   Lehman Prom FNP   Signed by:   Lehman Prom FNP on 03/25/2010   Method used:   Electronically to        [facility] [phone]* (retail)       [address]        [place], [place]  [address]       Ph: [phone]       Fax: [phone]   RxID:   [id number] NEXIUM 40 MG CPDR (ESOMEPRAZOLE MAGNESIUM) One tablet by mouth daily before breakfast  #30 x 11   Entered and Authorized by:   Lehman Prom FNP   Signed by:   Lehman Prom FNP on 03/25/2010  Method used:   Electronically to        [facility] [phone]* (retail)       [address]       [place], [place]  [address]       Ph: [phone]       Fax: [phone]   RxID:   [id number] ATENOLOL 25 MG TABS (ATENOLOL) One tablet by mouth two times a day for blood pressure  #60 x 11   Entered and Authorized by:   Lehman Prom FNP   Signed by:   Lehman Prom FNP on 03/25/2010   Method used:   Electronically to        [facility] [phone]* (retail)       [address]       [place], [place]  [address]       Ph: [phone]       Fax: [phone]   RxID:   [id number] METFORMIN HCL 500 MG TABS (METFORMIN HCL) One tablet by mouth two times a day for diabetes  #60 x 11   Entered and Authorized by:   Lehman Prom FNP   Signed by:   Lehman Prom FNP on 03/25/2010   Method used:   Electronically to        [facility] [phone]* (retail)       [address]       [place], [place]  [address]       Ph: [phone]       Fax: [phone]   RxID:   [id number]    Orders Added: 1)  Capillary Blood Glucose/CBG [82948] 2)  Est. Patient Level IV [38182] 3)  Hemoglobin A1C [83036]    Laboratory Results   Urine Tests  Date/Time Received: March 25, 2010 12:14 PM   Routine Urinalysis   Color: lt. yellow Glucose: >=1000   (Normal Range: Negative) Bilirubin: negative   (Normal Range: Negative) Ketone: negative   (Normal Range: Negative) Spec. Gravity: >=1.030   (Normal Range: 1.003-1.035) Blood: moderate   (Normal Range: Negative) pH: 5.5   (Normal Range: 5.0-8.0) Protein: negative   (Normal Range: Negative) Urobilinogen: 0.2   (Normal Range:  0-1) Nitrite: negative   (Normal Range: Negative) Leukocyte Esterace: negative   (Normal Range: Negative)     Blood Tests   Date/Time Received: March 25, 2010   HGBA1C: 7.7%   (Normal Range: Non-Diabetic - 3-6%   Control Diabetic - 6-8%) CBG Random:: 232      CT of Abdomen  Procedure date:  02/04/2010  Findings:      no acute or focal abnormalities to explain abdominal pain, borderline cardiomegaly without failure, benign- appearing left stenosis, s/p cholecystecomy   CT of Abdomen  Procedure date:  02/04/2010  Findings:      no acute or focal abnormalities to explain abdominal pain, borderline cardiomegaly without failure, benign- appearing left stenosis, s/p cholecystecomy   Laboratory Results   Urine Tests    Routine Urinalysis   Color: lt. yellow Glucose: >=1000   (Normal Range: Negative) Bilirubin: negative   (Normal Range: Negative) Ketone: negative   (Normal Range: Negative) Spec. Gravity: >=1.030   (Normal Range: 1.003-1.035) Blood: moderate   (Normal Range: Negative) pH: 5.5   (Normal Range: 5.0-8.0) Protein: negative   (Normal Range: Negative) Urobilinogen: 0.2   (Normal Range: 0-1) Nitrite: negative   (Normal Range: Negative) Leukocyte Esterace: negative   (Normal Range: Negative)     Blood Tests     HGBA1C:  7.7%   (Normal Range: Non-Diabetic - 3-6%   Control Diabetic - 6-8%) CBG Random:: 232mg /dL

## 2010-04-22 ENCOUNTER — Encounter (INDEPENDENT_AMBULATORY_CARE_PROVIDER_SITE_OTHER): Payer: Self-pay | Admitting: Nurse Practitioner

## 2010-04-22 ENCOUNTER — Encounter: Payer: Self-pay | Admitting: Nurse Practitioner

## 2010-04-22 LAB — CONVERTED CEMR LAB
Blood Glucose, Fingerstick: 205
Ketones, urine, test strip: NEGATIVE
Nitrite: NEGATIVE

## 2010-04-27 NOTE — Assessment & Plan Note (Signed)
Summary: Diabetes   Vital Signs:  Patient profile:   45 year old female Menstrual status:  hysterectomy Weight:      255.9 pounds BMI:     45.49 Temp:     97.4 degrees F oral Pulse rate:   72 / minute Pulse rhythm:   regular Resp:     16 per minute BP sitting:   130 / 86  (left arm) Cuff size:   large  Vitals Entered By: Levon Hedger (April 22, 2010 2:39 PM)  Nutrition Counseling: Patient's BMI is greater than 25 and therefore counseled on weight management options. CC: 4 week follow-up, Hypertension Management, Abdominal Pain Is Patient Diabetic? Yes Pain Assessment Patient in pain? no      CBG Result 205 CBG Device ID B  Does patient need assistance? Functional Status Self care Ambulation Normal   CC:  4 week follow-up, Hypertension Management, and Abdominal Pain.  History of Present Illness:  Pt into the office for f/u on diabetes and htn.  Obesity - up 9 pounds since the last visit.  Pt has concerns that it is fluid. She went to mental health today and the risperadol was d/c due to weight gain.  Diabetes Management History:      The patient is a 45 years old female who comes in for evaluation of DM Type 2.  She has not been enrolled in the "Diabetic Education Program".  She states lack of understanding of dietary principles and is not following her diet appropriately.  No sensory loss is reported.  Self foot exams are not being performed.  She is checking home blood sugars.  She says that she is not exercising regularly.        Hypoglycemic symptoms are not occurring.  No hyperglycemic symptoms are reported.  Other comments include: pt did the meter so she is not able to check her blood sugar.        No changes have been made to her treatment plan since last visit.    Dyspepsia History:      She has no alarm features of dyspepsia including no history of melena, hematochezia, dysphagia, persistent vomiting, or involuntary weight loss > 5%.  There is a prior  history of GERD.  The patient does not have a prior history of documented ulcer disease.  The dominant symptom is not heartburn or acid reflux.  An H-2 blocker medication is not currently being taken.    Hypertension History:      She denies headache, chest pain, and palpitations.  She notes no problems with any antihypertensive medication side effects.        Positive major cardiovascular risk factors include diabetes and hypertension.  Negative major cardiovascular risk factors include female age less than 37 years old and non-tobacco-user status.        Further assessment for target organ damage reveals no history of ASHD, cardiac end-organ damage (CHF/LVH), stroke/TIA, peripheral vascular disease, renal insufficiency, or hypertensive retinopathy.      Allergies (verified): 1)  ! Pepcid 2)  ! Demerol  Review of Systems General:  Denies fever. CV:  Complains of weight gain; denies chest pain or discomfort. Resp:  Complains of shortness of breath; denies cough. GI:  Complains of indigestion; denies abdominal pain, nausea, and vomiting; ongoing - pt does not have any nexium because she no longer has medicaid and can't be able to afford. Psych:  Complains of anxiety and mental problems; I was having a problem a couple of  weeks ago "but I am better now".  Physical Exam  General:  alert.   Head:  normocephalic.   Lungs:  normal breath sounds.   Heart:  normal rate and regular rhythm.   Abdomen:  normal bowel sounds.   Neurologic:  alert & oriented X3.   Skin:  color normal.   Psych:  slightly anxious  Diabetes Management Exam:    Foot Exam (with socks and/or shoes not present):       Sensory-Monofilament:          Left foot: normal          Right foot: normal   Impression & Recommendations:  Problem # 1:  DIABETES MELLITUS (ICD-250.00)  Her updated medication list for this problem includes:    Metformin Hcl 500 Mg Tabs (Metformin hcl) ..... One tablet by mouth two times a day  for diabetes    Aspirin 81 Mg Tbec (Aspirin) ..... One tablet by mouth daily    Glucotrol Xl 5 Mg Xr24h-tab (Glipizide) ..... One tablet by mouth daily for blood sugar  Orders: UA Dipstick w/o Micro (manual) (91478) Capillary Blood Glucose/CBG (29562)  Problem # 2:  HYPERTENSION, BENIGN ESSENTIAL (ICD-401.1) BP is doing much better continue doing well. Her updated medication list for this problem includes:    Atenolol 25 Mg Tabs (Atenolol) ..... One tablet by mouth two times a day for blood pressure    Furosemide 20 Mg Tabs (Furosemide) ..... One tablet by mouth daily  Problem # 3:  TOBACCO ABUSE (ICD-305.1) advise cessation  Problem # 4:  ANXIETY (ICD-300.00) pt has been to mental health today Her updated medication list for this problem includes:    Lorazepam 0.5 Mg Tabs (Lorazepam) ..... One tablet by mouth two times a day    Fluoxetine Hcl 20 Mg Caps (Fluoxetine hcl) .Marland Kitchen... 2 capsules by mouth daily **rx by psych*8    Hydroxyzine Hcl 50 Mg Tabs (Hydroxyzine hcl) ..... One or two tablets by mouth at bedtime **rx by psych**  Problem # 5:  ANEMIA (ICD-285.9)  Her updated medication list for this problem includes:    Ferrous Sulfate 325 (65 Fe) Mg Tabs (Ferrous sulfate) ..... One tablet by mouth daily  Complete Medication List: 1)  Metformin Hcl 500 Mg Tabs (Metformin hcl) .... One tablet by mouth two times a day for diabetes 2)  Atenolol 25 Mg Tabs (Atenolol) .... One tablet by mouth two times a day for blood pressure 3)  Lorazepam 0.5 Mg Tabs (Lorazepam) .... One tablet by mouth two times a day 4)  Furosemide 20 Mg Tabs (Furosemide) .... One tablet by mouth daily 5)  Nexium 40 Mg Cpdr (Esomeprazole magnesium) .... One tablet by mouth daily before breakfast 6)  Aspirin 81 Mg Tbec (Aspirin) .... One tablet by mouth daily 7)  Blood Glucose Meter Kit (Blood glucose monitoring suppl) .... Use to check blood sugar daily before breakfast 8)  Fluoxetine Hcl 20 Mg Caps (Fluoxetine hcl)  .... 2 capsules by mouth daily **rx by psych*8 9)  Glucotrol Xl 5 Mg Xr24h-tab (Glipizide) .... One tablet by mouth daily for blood sugar 10)  Naprosyn 500 Mg Tabs (Naproxen) .... One tablet by mouth daily as needed for leg pain 11)  Ferrous Sulfate 325 (65 Fe) Mg Tabs (Ferrous sulfate) .... One tablet by mouth daily 12)  Zolpidem Tartrate 10 Mg Tabs (Zolpidem tartrate) .... One tablet by mouth nightly  **rx by psych** 13)  Hydroxyzine Hcl 50 Mg Tabs (Hydroxyzine hcl) .... One  or two tablets by mouth at bedtime **rx by psych** 14)  Onetouch Lancets Misc (Lancets) .... Use to check blood sugar daily before breakfast 15)  Onetouch Test Strp (Glucose blood) .... Use to test blood sugar daily before breakfast 16)  Depakote Er 500 Mg Xr24h-tab (Divalproex sodium) .... 2 tablets by mouth nightly 17)  Benztropine Mesylate 1 Mg Tabs (Benztropine mesylate) .... One tablet by mouth two times a day 18)  Accu-chek Aviva Kit (Blood glucose monitoring suppl) .... Use to check blood sugar daily 19)  Accu-chek Aviva Strp (Glucose blood) .... Use toi check blood sugar daily for diabetes 20)  Accu-chek Softclix Lancets Misc (Lancets) .... Use to check blood sugar daily 21)  Gabapentin 300 Mg Caps (Gabapentin) .... One capsule by mouth nightly for cramps 22)  Symbicort 160-4.5 Mcg/act Aero (Budesonide-formoterol fumarate) .... One inhalation two times a day  Diabetes Management Assessment/Plan:      Her blood pressure goal is < 130/80.    Hypertension Assessment/Plan:      [name] hypertensive risk group is category C: Target organ damage and/or diabetes.  Her calculated 10 year risk of coronary heart disease is 6 %.  Today's blood pressure is 130/86.  Her blood pressure goal is < 130/80.  Diabetic Foot Exam Foot Inspection Is there a history of a foot ulcer?              No Is there a foot ulcer now?              No Can the patient see the bottom of their feet?          No Are the shoes appropriate in  style and fit?          Yes Is there swelling or an abnormal foot shape?          No Are the toenails long?                No Are the toenails thick?                No Are the toenails ingrown?              No Is there heavy callous build-up?              No Is there pain in the calf muscle (Intermittent claudication) when walking?    NoIs there a claw toe deformity?              No Is there elevated skin temperature?            No Is there limited ankle dorsiflexion?            No Is there foot or ankle muscle weakness?            No  Diabetic Foot Care Education Pulse Check          Right Foot          Left Foot Dorsalis Pedis:        normal            normal    10-g (5.07) Semmes-Weinstein Monofilament Test Performed by: Levon Hedger          Right Foot          Left Foot Visual Inspection                Patient Instructions: 1)  Since you no longer have medicaid then you will need  to get an orange.  2)  You need to make a list of at least 3 things that you should do everyday.  Such as the following 3)  1.  Get up and take a bath and put your clothes on 4)  2.  Wash the dishes, keep clothes folded and bathroom clean 5)  3.  Eat small meals for breakfast, lunch and dinner 6)  Follow up in 4 weeks for diabetes and high blood pressure. Prescriptions: SYMBICORT 160-4.5 MCG/ACT AERO (BUDESONIDE-FORMOTEROL FUMARATE) One inhalation two times a day  #1 x 0   Entered and Authorized by:   Lehman Prom FNP   Signed by:   Lehman Prom FNP on 04/22/2010   Method used:   Samples Given   RxID:   [id number] NEXIUM 40 MG CPDR (ESOMEPRAZOLE MAGNESIUM) One tablet by mouth daily before breakfast  #15 x 0   Entered and Authorized by:   Lehman Prom FNP   Signed by:   Lehman Prom FNP on 04/22/2010   Method used:   Samples Given   RxID:   [id number] FUROSEMIDE 20 MG TABS (FUROSEMIDE) One tablet by mouth daily  #30 x 5   Entered and Authorized by:   Lehman Prom  FNP   Signed by:   Lehman Prom FNP on 04/22/2010   Method used:   Print then Give to Patient   RxID:   [id number]    Orders Added: 1)  Est. Patient Level IV [87564] 2)  UA Dipstick w/o Micro (manual) [81002] 3)  Capillary Blood Glucose/CBG [82948]    Last LDL:                                                 109 (08/03/2009 10:41:00 PM)        Diabetic Foot Exam Pulse Check          Right Foot          Left Foot Dorsalis Pedis:        normal            normal    10-g (5.07) Semmes-Weinstein Monofilament Test Performed by: Levon Hedger          Right Foot          Left Foot Visual Inspection               Test Control      normal         normal Site 1         normal         normal Site 2         normal         normal Site 3         normal         normal Site 4         normal         normal Site 5         normal         normal Site 6         normal         normal Site 7         normal         normal Site 8         normal  normal Site 9         normal         normal Site 10         normal         normal  Impression      normal         normal   Laboratory Results   Urine Tests  Date/Time Received: April 22, 2010 2:50 PM   Routine Urinalysis   Glucose: negative   (Normal Range: Negative) Bilirubin: negative   (Normal Range: Negative) Ketone: negative   (Normal Range: Negative) Spec. Gravity: 1.015   (Normal Range: 1.003-1.035) Blood: trace-intact   (Normal Range: Negative) pH: 7.5   (Normal Range: 5.0-8.0) Protein: trace   (Normal Range: Negative) Urobilinogen: 0.2   (Normal Range: 0-1) Nitrite: negative   (Normal Range: Negative) Leukocyte Esterace: negative   (Normal Range: Negative)     Blood Tests     CBG Random:: 205

## 2010-05-10 LAB — GLUCOSE, CAPILLARY: Glucose-Capillary: 110 mg/dL — ABNORMAL HIGH (ref 70–99)

## 2010-05-10 LAB — CBC
HCT: 32 % — ABNORMAL LOW (ref 36.0–46.0)
HCT: 36.6 % (ref 36.0–46.0)
Hemoglobin: 10.4 g/dL — ABNORMAL LOW (ref 12.0–15.0)
Hemoglobin: 11.9 g/dL — ABNORMAL LOW (ref 12.0–15.0)
MCH: 25.1 pg — ABNORMAL LOW (ref 26.0–34.0)
MCHC: 32.5 g/dL (ref 30.0–36.0)
MCHC: 32.5 g/dL (ref 30.0–36.0)
MCV: 76.3 fL — ABNORMAL LOW (ref 78.0–100.0)
MCV: 77.3 fL — ABNORMAL LOW (ref 78.0–100.0)
WBC: 11.4 10*3/uL — ABNORMAL HIGH (ref 4.0–10.5)

## 2010-05-10 LAB — BASIC METABOLIC PANEL
CO2: 25 mEq/L (ref 19–32)
Calcium: 8.2 mg/dL — ABNORMAL LOW (ref 8.4–10.5)
Glucose, Bld: 122 mg/dL — ABNORMAL HIGH (ref 70–99)
Sodium: 137 mEq/L (ref 135–145)

## 2010-05-10 LAB — LIPASE, BLOOD: Lipase: 22 U/L (ref 11–59)

## 2010-05-10 LAB — URINE CULTURE: Culture: NO GROWTH

## 2010-05-10 LAB — URINALYSIS, ROUTINE W REFLEX MICROSCOPIC
Glucose, UA: NEGATIVE mg/dL
Ketones, ur: NEGATIVE mg/dL
Leukocytes, UA: NEGATIVE
Nitrite: NEGATIVE
Protein, ur: NEGATIVE mg/dL

## 2010-05-10 LAB — COMPREHENSIVE METABOLIC PANEL
ALT: 18 U/L (ref 0–35)
Alkaline Phosphatase: 97 U/L (ref 39–117)
CO2: 23 mEq/L (ref 19–32)
GFR calc non Af Amer: >60 mL/min (ref >60)
Glucose, Bld: 108 mg/dL — ABNORMAL HIGH (ref 70–99)
Potassium: 3.5 mEq/L (ref 3.5–5.1)
Sodium: 135 mEq/L (ref 135–145)

## 2010-05-10 LAB — URINE MICROSCOPIC-ADD ON

## 2010-05-10 LAB — DIFFERENTIAL
Basophils Absolute: 0 10*3/uL (ref 0.0–0.1)
Basophils Relative: 0 % (ref 0–1)
Eosinophils Absolute: 0.1 10*3/uL (ref 0.0–0.7)
Neutrophils Relative %: 66 % (ref 43–77)

## 2010-05-10 LAB — HEMOGLOBIN A1C: Hgb A1c MFr Bld: 7.1 % — ABNORMAL HIGH (ref <5.7)

## 2010-05-15 LAB — CBC
HCT: 29.3 % — ABNORMAL LOW (ref 36.0–46.0)
HCT: 33 % — ABNORMAL LOW (ref 36.0–46.0)
Hemoglobin: 9.9 g/dL — ABNORMAL LOW (ref 12.0–15.0)
MCH: 26.2 pg (ref 26.0–34.0)
MCV: 81.2 fL (ref 78.0–100.0)
MCV: 80.1 fL (ref 78.0–100.0)
Platelets: 342 10*3/uL (ref 150–400)
RBC: 3.6 MIL/uL — ABNORMAL LOW (ref 3.87–5.11)
RBC: 4.12 MIL/uL (ref 3.87–5.11)
WBC: 12.2 10*3/uL — ABNORMAL HIGH (ref 4.0–10.5)
WBC: 12.6 10*3/uL — ABNORMAL HIGH (ref 4.0–10.5)

## 2010-05-15 LAB — DIFFERENTIAL
Basophils Absolute: 0.1 10*3/uL (ref 0.0–0.1)
Basophils Relative: 0 % (ref 0–1)
Eosinophils Absolute: 0.1 10*3/uL (ref 0.0–0.7)
Eosinophils Absolute: 0.3 10*3/uL (ref 0.0–0.7)
Eosinophils Relative: 1 % (ref 0–5)
Lymphocytes Relative: 18 % (ref 12–46)
Lymphs Abs: 2.1 10*3/uL (ref 0.7–4.0)
Monocytes Absolute: 0.5 10*3/uL (ref 0.1–1.0)
Monocytes Relative: 4 % (ref 3–12)
Monocytes Relative: 4 % (ref 3–12)
Neutrophils Relative %: 66 % (ref 43–77)

## 2010-05-15 LAB — URINALYSIS, ROUTINE W REFLEX MICROSCOPIC
Ketones, ur: NEGATIVE mg/dL
Nitrite: NEGATIVE
Specific Gravity, Urine: 1.022 (ref 1.005–1.030)
Urobilinogen, UA: 0.2 mg/dL (ref 0.0–1.0)
pH: 6 (ref 5.0–8.0)

## 2010-05-15 LAB — BRAIN NATRIURETIC PEPTIDE: Pro B Natriuretic peptide (BNP): <30 pg/mL (ref 0.0–100.0)

## 2010-05-15 LAB — COMPREHENSIVE METABOLIC PANEL
ALT: 14 U/L (ref 0–35)
Alkaline Phosphatase: 103 U/L (ref 39–117)
BUN: 10 mg/dL (ref 6–23)
Chloride: 105 mEq/L (ref 96–112)
Glucose, Bld: 92 mg/dL (ref 70–99)
Potassium: 4.1 mEq/L (ref 3.5–5.1)
Sodium: 142 mEq/L (ref 135–145)
Total Bilirubin: 0.5 mg/dL (ref 0.3–1.2)
Total Protein: 7.2 g/dL (ref 6.0–8.3)

## 2010-05-15 LAB — BASIC METABOLIC PANEL
Chloride: 103 mEq/L (ref 96–112)
GFR calc Af Amer: >60 mL/min (ref >60)
Potassium: 4.2 mEq/L (ref 3.5–5.1)
Sodium: 137 mEq/L (ref 135–145)

## 2010-05-15 LAB — URINE CULTURE

## 2010-05-15 LAB — POCT CARDIAC MARKERS: Troponin i, poc: <0.05 ng/mL (ref 0.00–0.09)

## 2010-05-15 LAB — PREGNANCY, URINE: Preg Test, Ur: NEGATIVE

## 2010-05-15 LAB — POCT TOXICOLOGY PANEL

## 2010-05-15 LAB — URINE MICROSCOPIC-ADD ON

## 2010-05-16 LAB — BASIC METABOLIC PANEL
BUN: 10 mg/dL (ref 6–23)
BUN: 9 mg/dL (ref 6–23)
CO2: 22 mEq/L (ref 19–32)
CO2: 26 mEq/L (ref 19–32)
CO2: 27 mEq/L (ref 19–32)
Calcium: 8 mg/dL — ABNORMAL LOW (ref 8.4–10.5)
Calcium: 8.6 mg/dL (ref 8.4–10.5)
Calcium: 8.6 mg/dL (ref 8.4–10.5)
Calcium: 8.9 mg/dL (ref 8.4–10.5)
Chloride: 99 mEq/L (ref 96–112)
Chloride: 99 mEq/L (ref 96–112)
Creatinine, Ser: 0.97 mg/dL (ref 0.4–1.2)
Creatinine, Ser: 1.13 mg/dL (ref 0.4–1.2)
GFR calc Af Amer: >60 mL/min (ref >60)
GFR calc Af Amer: >60 mL/min (ref >60)
GFR calc Af Amer: >60 mL/min (ref >60)
GFR calc Af Amer: >60 mL/min (ref >60)
GFR calc non Af Amer: >60 mL/min (ref >60)
GFR calc non Af Amer: 52 mL/min — ABNORMAL LOW (ref >60)
GFR calc non Af Amer: 52 mL/min — ABNORMAL LOW (ref >60)
GFR calc non Af Amer: 56 mL/min — ABNORMAL LOW (ref >60)
Glucose, Bld: 105 mg/dL — ABNORMAL HIGH (ref 70–99)
Glucose, Bld: 112 mg/dL — ABNORMAL HIGH (ref 70–99)
Glucose, Bld: 123 mg/dL — ABNORMAL HIGH (ref 70–99)
Potassium: 3.6 mEq/L (ref 3.5–5.1)
Potassium: 4 mEq/L (ref 3.5–5.1)
Potassium: 4.1 mEq/L (ref 3.5–5.1)
Sodium: 135 mEq/L (ref 135–145)
Sodium: 130 mEq/L — ABNORMAL LOW (ref 135–145)
Sodium: 132 mEq/L — ABNORMAL LOW (ref 135–145)
Sodium: 134 mEq/L — ABNORMAL LOW (ref 135–145)

## 2010-05-16 LAB — POCT CARDIAC MARKERS
CKMB, poc: 2.5 ng/mL (ref 1.0–8.0)
CKMB, poc: 1.1 ng/mL (ref 1.0–8.0)
Troponin i, poc: <0.05 ng/mL (ref 0.00–0.09)

## 2010-05-16 LAB — COMPREHENSIVE METABOLIC PANEL
AST: 17 U/L (ref 0–37)
Albumin: 2.5 g/dL — ABNORMAL LOW (ref 3.5–5.2)
BUN: 14 mg/dL (ref 6–23)
CO2: 22 mEq/L (ref 19–32)
CO2: 24 mEq/L (ref 19–32)
Calcium: 7.9 mg/dL — ABNORMAL LOW (ref 8.4–10.5)
Chloride: 108 mEq/L (ref 96–112)
Creatinine, Ser: 1.06 mg/dL (ref 0.4–1.2)
Creatinine, Ser: 0.97 mg/dL (ref 0.4–1.2)
GFR calc Af Amer: >60 mL/min (ref >60)
GFR calc non Af Amer: 56 mL/min — ABNORMAL LOW (ref >60)
GFR calc non Af Amer: >60 mL/min (ref >60)
Total Bilirubin: 0.5 mg/dL (ref 0.3–1.2)
Total Protein: 6.2 g/dL (ref 6.0–8.3)

## 2010-05-16 LAB — DIFFERENTIAL
Basophils Absolute: 0 10*3/uL (ref 0.0–0.1)
Basophils Absolute: 0 K/uL (ref 0.0–0.1)
Basophils Relative: 0 % (ref 0–1)
Basophils Relative: 0 % (ref 0–1)
Eosinophils Absolute: 0 K/uL (ref 0.0–0.7)
Eosinophils Relative: 0 % (ref 0–5)
Eosinophils Relative: 1 % (ref 0–5)
Lymphocytes Relative: 12 % (ref 12–46)
Lymphocytes Relative: 16 % (ref 12–46)
Lymphs Abs: 1.6 10*3/uL (ref 0.7–4.0)
Lymphs Abs: 1.9 K/uL (ref 0.7–4.0)
Monocytes Absolute: 1.1 10*3/uL — ABNORMAL HIGH (ref 0.1–1.0)
Monocytes Absolute: 0.7 10*3/uL (ref 0.1–1.0)
Monocytes Relative: 4 % (ref 3–12)
Monocytes Relative: 6 % (ref 3–12)
Neutro Abs: 13.8 10*3/uL — ABNORMAL HIGH (ref 1.7–7.7)
Neutro Abs: 13.1 10*3/uL — ABNORMAL HIGH (ref 1.7–7.7)
Neutrophils Relative %: 76 % (ref 43–77)
Neutrophils Relative %: 84 % — ABNORMAL HIGH (ref 43–77)

## 2010-05-16 LAB — GLUCOSE, CAPILLARY
Glucose-Capillary: 108 mg/dL — ABNORMAL HIGH (ref 70–99)
Glucose-Capillary: 138 mg/dL — ABNORMAL HIGH (ref 70–99)
Glucose-Capillary: 103 mg/dL — ABNORMAL HIGH (ref 70–99)
Glucose-Capillary: 104 mg/dL — ABNORMAL HIGH (ref 70–99)
Glucose-Capillary: 105 mg/dL — ABNORMAL HIGH (ref 70–99)
Glucose-Capillary: 105 mg/dL — ABNORMAL HIGH (ref 70–99)
Glucose-Capillary: 106 mg/dL — ABNORMAL HIGH (ref 70–99)
Glucose-Capillary: 111 mg/dL — ABNORMAL HIGH (ref 70–99)
Glucose-Capillary: 112 mg/dL — ABNORMAL HIGH (ref 70–99)
Glucose-Capillary: 112 mg/dL — ABNORMAL HIGH (ref 70–99)
Glucose-Capillary: 116 mg/dL — ABNORMAL HIGH (ref 70–99)
Glucose-Capillary: 119 mg/dL — ABNORMAL HIGH (ref 70–99)
Glucose-Capillary: 121 mg/dL — ABNORMAL HIGH (ref 70–99)
Glucose-Capillary: 122 mg/dL — ABNORMAL HIGH (ref 70–99)
Glucose-Capillary: 125 mg/dL — ABNORMAL HIGH (ref 70–99)
Glucose-Capillary: 140 mg/dL — ABNORMAL HIGH (ref 70–99)
Glucose-Capillary: 188 mg/dL — ABNORMAL HIGH (ref 70–99)
Glucose-Capillary: 87 mg/dL (ref 70–99)

## 2010-05-16 LAB — CBC
HCT: 33.8 % — ABNORMAL LOW (ref 36.0–46.0)
HCT: 27 % — ABNORMAL LOW (ref 36.0–46.0)
HCT: 27.8 % — ABNORMAL LOW (ref 36.0–46.0)
HCT: 29.6 % — ABNORMAL LOW (ref 36.0–46.0)
Hemoglobin: 9 g/dL — ABNORMAL LOW (ref 12.0–15.0)
Hemoglobin: 9.2 g/dL — ABNORMAL LOW (ref 12.0–15.0)
Hemoglobin: 9.4 g/dL — ABNORMAL LOW (ref 12.0–15.0)
Hemoglobin: 8.7 g/dL — ABNORMAL LOW (ref 12.0–15.0)
Hemoglobin: 9 g/dL — ABNORMAL LOW (ref 12.0–15.0)
Hemoglobin: 9.8 g/dL — ABNORMAL LOW (ref 12.0–15.0)
MCH: 26.4 pg (ref 26.0–34.0)
MCH: 26.6 pg (ref 26.0–34.0)
MCH: 26.8 pg (ref 26.0–34.0)
MCHC: 32.9 g/dL (ref 30.0–36.0)
MCHC: 33.5 g/dL (ref 30.0–36.0)
MCHC: 32.3 g/dL (ref 30.0–36.0)
MCHC: 32.4 g/dL (ref 30.0–36.0)
MCHC: 32.6 g/dL (ref 30.0–36.0)
MCHC: 33 g/dL (ref 30.0–36.0)
MCV: 80.3 fL (ref 78.0–100.0)
MCV: 80.7 fL (ref 78.0–100.0)
MCV: 79.4 fL (ref 78.0–100.0)
MCV: 79.8 fL (ref 78.0–100.0)
MCV: 79.9 fL (ref 78.0–100.0)
MCV: 81.1 fL (ref 78.0–100.0)
Platelets: 347 10*3/uL (ref 150–400)
Platelets: 384 10*3/uL (ref 150–400)
Platelets: 288 10*3/uL (ref 150–400)
Platelets: 329 K/uL (ref 150–400)
Platelets: 334 10*3/uL (ref 150–400)
Platelets: 356 10*3/uL (ref 150–400)
RBC: 3.37 MIL/uL — ABNORMAL LOW (ref 3.87–5.11)
RBC: 3.47 MIL/uL — ABNORMAL LOW (ref 3.87–5.11)
RBC: 3.51 MIL/uL — ABNORMAL LOW (ref 3.87–5.11)
RBC: 3.38 MIL/uL — ABNORMAL LOW (ref 3.87–5.11)
RBC: 3.48 MIL/uL — ABNORMAL LOW (ref 3.87–5.11)
RBC: 3.73 MIL/uL — ABNORMAL LOW (ref 3.87–5.11)
RDW: 18 % — ABNORMAL HIGH (ref 11.5–15.5)
RDW: 17.5 % — ABNORMAL HIGH (ref 11.5–15.5)
RDW: 17.7 % — ABNORMAL HIGH (ref 11.5–15.5)
RDW: 17.8 % — ABNORMAL HIGH (ref 11.5–15.5)
RDW: 17.9 % — ABNORMAL HIGH (ref 11.5–15.5)
WBC: 10.7 10*3/uL — ABNORMAL HIGH (ref 4.0–10.5)
WBC: 11.1 10*3/uL — ABNORMAL HIGH (ref 4.0–10.5)
WBC: 11.2 10*3/uL — ABNORMAL HIGH (ref 4.0–10.5)
WBC: 15.6 K/uL — ABNORMAL HIGH (ref 4.0–10.5)

## 2010-05-16 LAB — IRON AND TIBC
Iron: 12 ug/dL — ABNORMAL LOW (ref 42–135)
Iron: 13 ug/dL — ABNORMAL LOW (ref 42–135)
TIBC: 234 ug/dL — ABNORMAL LOW (ref 250–470)
TIBC: 241 ug/dL — ABNORMAL LOW (ref 250–470)
UIBC: 229 ug/dL

## 2010-05-16 LAB — CARDIAC PANEL(CRET KIN+CKTOT+MB+TROPI)
CK, MB: 0.8 ng/mL (ref 0.3–4.0)
CK, MB: 0.9 ng/mL (ref 0.3–4.0)
Relative Index: 0.9 (ref 0.0–2.5)
Relative Index: 0.9 (ref 0.0–2.5)
Relative Index: 0.7 (ref 0.0–2.5)
Total CK: 191 U/L — ABNORMAL HIGH (ref 7–177)
Total CK: 198 U/L — ABNORMAL HIGH (ref 7–177)
Total CK: 232 U/L — ABNORMAL HIGH (ref 7–177)
Total CK: 110 U/L (ref 7–177)
Troponin I: 0.03 ng/mL (ref 0.00–0.06)
Troponin I: <0.01 ng/mL (ref 0.00–0.06)
Troponin I: 0.01 ng/mL (ref 0.00–0.06)

## 2010-05-16 LAB — IRON: Iron: 26 ug/dL — ABNORMAL LOW (ref 42–135)

## 2010-05-16 LAB — FERRITIN
Ferritin: 83 ng/mL (ref 10–291)
Ferritin: 147 ng/mL (ref 10–291)

## 2010-05-16 LAB — HEMOGLOBIN AND HEMATOCRIT, BLOOD
HCT: 27.2 % — ABNORMAL LOW (ref 36.0–46.0)
HCT: 28.7 % — ABNORMAL LOW (ref 36.0–46.0)
HCT: 29.1 % — ABNORMAL LOW (ref 36.0–46.0)
Hemoglobin: 9.2 g/dL — ABNORMAL LOW (ref 12.0–15.0)
Hemoglobin: 9.4 g/dL — ABNORMAL LOW (ref 12.0–15.0)
Hemoglobin: 9.4 g/dL — ABNORMAL LOW (ref 12.0–15.0)

## 2010-05-16 LAB — BLOOD GAS, ARTERIAL
Acid-base deficit: 1 mmol/L (ref 0.0–2.0)
Bicarbonate: 22.2 mEq/L (ref 20.0–24.0)
O2 Saturation: 95.1 %
pO2, Arterial: 74.6 mmHg — ABNORMAL LOW (ref 80.0–100.0)

## 2010-05-16 LAB — HEMOGLOBIN A1C
Hgb A1c MFr Bld: 7.4 % — ABNORMAL HIGH (ref <5.7)
Mean Plasma Glucose: 166 mg/dL — ABNORMAL HIGH (ref <117)
Mean Plasma Glucose: 154 mg/dL

## 2010-05-16 LAB — POCT I-STAT, CHEM 8
Calcium, Ion: 1.11 mmol/L — ABNORMAL LOW (ref 1.12–1.32)
Chloride: 105 mEq/L (ref 96–112)
Glucose, Bld: 117 mg/dL — ABNORMAL HIGH (ref 70–99)
HCT: 37 % (ref 36.0–46.0)

## 2010-05-16 LAB — URINALYSIS, ROUTINE W REFLEX MICROSCOPIC
Glucose, UA: NEGATIVE mg/dL
Specific Gravity, Urine: 1.027 (ref 1.005–1.030)
pH: 7 (ref 5.0–8.0)

## 2010-05-16 LAB — BASIC METABOLIC PANEL WITH GFR
BUN: 4 mg/dL — ABNORMAL LOW (ref 6–23)
Chloride: 102 meq/L (ref 96–112)
Creatinine, Ser: 0.79 mg/dL (ref 0.4–1.2)
GFR calc non Af Amer: >60 mL/min (ref >60)
Glucose, Bld: 120 mg/dL — ABNORMAL HIGH (ref 70–99)

## 2010-05-16 LAB — URINE MICROSCOPIC-ADD ON

## 2010-05-16 LAB — RETICULOCYTES
RBC.: 3.64 MIL/uL — ABNORMAL LOW (ref 3.87–5.11)
Retic Count, Absolute: 52.1 10*3/uL (ref 19.0–186.0)

## 2010-05-16 LAB — TSH: TSH: 2.584 u[IU]/mL (ref 0.350–4.500)

## 2010-05-16 LAB — POCT PREGNANCY, URINE: Preg Test, Ur: NEGATIVE

## 2010-05-16 LAB — D-DIMER, QUANTITATIVE: D-Dimer, Quant: 1.27 ug/mL-FEU — ABNORMAL HIGH (ref 0.00–0.48)

## 2010-05-16 LAB — LIPID PANEL
Cholesterol: 151 mg/dL (ref 0–200)
HDL: 59 mg/dL (ref >39)
Total CHOL/HDL Ratio: 2.6 RATIO

## 2010-05-16 LAB — T4, FREE: Free T4: 1.12 ng/dL (ref 0.80–1.80)

## 2010-05-21 ENCOUNTER — Telehealth (INDEPENDENT_AMBULATORY_CARE_PROVIDER_SITE_OTHER): Payer: Self-pay | Admitting: Nurse Practitioner

## 2010-05-27 NOTE — Progress Notes (Signed)
Summary: MED REFILL  Phone Note Call from Patient Call back at Home Phone [phone]   Reason for Call: Refill Medication Summary of Call: DUE TO ELGIBILITY TECHNICALITIES Cheyenne Alvarez IS ASKING IF Cheyenne Alvarez SCRIPTS CAN BE WRITTEN UNTIL THIS ISSUE IS SOLVED.  Cheyenne Alvarez SAID THE MOST IMPORTANT ONES ARE HER BLOOD PRESSURE PILL, STRIPS AND HER DIABETIC PILLS.   Initial call taken by: Ayesha Rumpf,  May 21, 2010 10:09 AM  Follow-up for Phone Call        Sent to N. Daphine Deutscher.  Dutch Quint RN  May 21, 2010 12:15 PM   Additional Follow-up for Phone Call Additional follow up Details #1::        I have no problems renewing refills but Is this approval for pt to be able to get the Rx filled at [facility] without eligibility because she can't afford to get from [facility] (I already sent there in January) Additional Follow-up by: Lehman Prom FNP,  May 21, 2010 12:45 PM    Additional Follow-up for Phone Call Additional follow up Details #2::    She lost her medicaid and can't get an orange card because the records online says she still has Medicaid and it can't be overridden. She had an appointment but she had to cancel because she couldn't get the OC.   She needs refills sent to the [facility].  Dutch Quint RN  May 21, 2010 5:01 PM   Additional Follow-up for Phone Call Additional follow up Details #3:: Details for Additional Follow-up Action Taken: Necessary meds sent to [facility] I do hope this can be straightened out - there must be a problem with the system b/c her medicaid has not been active for at least a month Additional Follow-up by: Lehman Prom FNP,  May 21, 2010 5:13 PM  Prescriptions: GLUCOTROL XL 5 MG XR24H-TAB (GLIPIZIDE) One tablet by mouth daily for blood sugar  #30 x 5   Entered and Authorized by:   Lehman Prom FNP   Signed by:   Lehman Prom FNP on 05/21/2010   Method used:   Electronically to        [facility] [phone]* (retail)  [address]       [place], [place]  [address]       Ph: [phone]       Fax: [phone]   RxID:   [id number] FUROSEMIDE 20 MG TABS (FUROSEMIDE) One tablet by mouth daily  #30 x 5   Entered and Authorized by:   Lehman Prom FNP   Signed by:   Lehman Prom FNP on 05/21/2010   Method used:   Electronically to        [facility] [phone]* (retail)       [address]       [place], [place]  [address]       Ph: [phone]       Fax: [phone]   RxID:   [id number] ATENOLOL 25 MG TABS (ATENOLOL) One tablet by mouth two times a day for blood pressure  #60 x 5   Entered and Authorized by:   Lehman Prom FNP   Signed by:   Lehman Prom FNP on 05/21/2010   Method used:   Electronically to        [facility] [phone]* (retail)       [address]       [place], [place]  [address]       Ph: [phone]  Fax: [phone]   RxID:   [id number] METFORMIN HCL 500 MG TABS (METFORMIN HCL) One tablet by mouth two times a day for diabetes  #60 x 5   Entered and Authorized by:   Lehman Prom FNP   Signed by:   Lehman Prom FNP on 05/21/2010   Method used:   Electronically to        [facility] [phone]* (retail)       [address]       [place], [place]  [address]       Ph: [phone]       Fax: [phone]   RxID:   [id number]

## 2010-06-05 LAB — COMPREHENSIVE METABOLIC PANEL
BUN: 7 mg/dL (ref 6–23)
CO2: 30 mEq/L (ref 19–32)
Calcium: 8.8 mg/dL (ref 8.4–10.5)
Creatinine, Ser: 1.05 mg/dL (ref 0.4–1.2)
GFR calc non Af Amer: 57 mL/min — ABNORMAL LOW (ref >60)
Glucose, Bld: 107 mg/dL — ABNORMAL HIGH (ref 70–99)
Total Protein: 6.4 g/dL (ref 6.0–8.3)

## 2010-06-05 LAB — CBC
Hemoglobin: 11.2 g/dL — ABNORMAL LOW (ref 12.0–15.0)
MCHC: 32.7 g/dL (ref 30.0–36.0)
MCV: 84.1 fL (ref 78.0–100.0)
RBC: 4.06 MIL/uL (ref 3.87–5.11)
RDW: 15.5 % (ref 11.5–15.5)

## 2010-06-05 LAB — TSH: TSH: 1.52 u[IU]/mL (ref 0.350–4.500)

## 2010-06-05 LAB — MAGNESIUM: Magnesium: 1.6 mg/dL (ref 1.5–2.5)

## 2010-06-05 LAB — BASIC METABOLIC PANEL
BUN: 13 mg/dL (ref 6–23)
Chloride: 99 mEq/L (ref 96–112)
GFR calc Af Amer: >60 mL/min (ref >60)
GFR calc non Af Amer: 56 mL/min — ABNORMAL LOW (ref >60)
Potassium: 3.8 mEq/L (ref 3.5–5.1)
Sodium: 133 mEq/L — ABNORMAL LOW (ref 135–145)

## 2010-06-13 IMAGING — CR DG CHEST 2V
2 series · 2 of 2 positions shown · non-contrast
Comparison: 03/27/2009

CLINICAL DATA: Follow up pneumonia, cough

CHEST - 2 VIEW

[w chest pa]
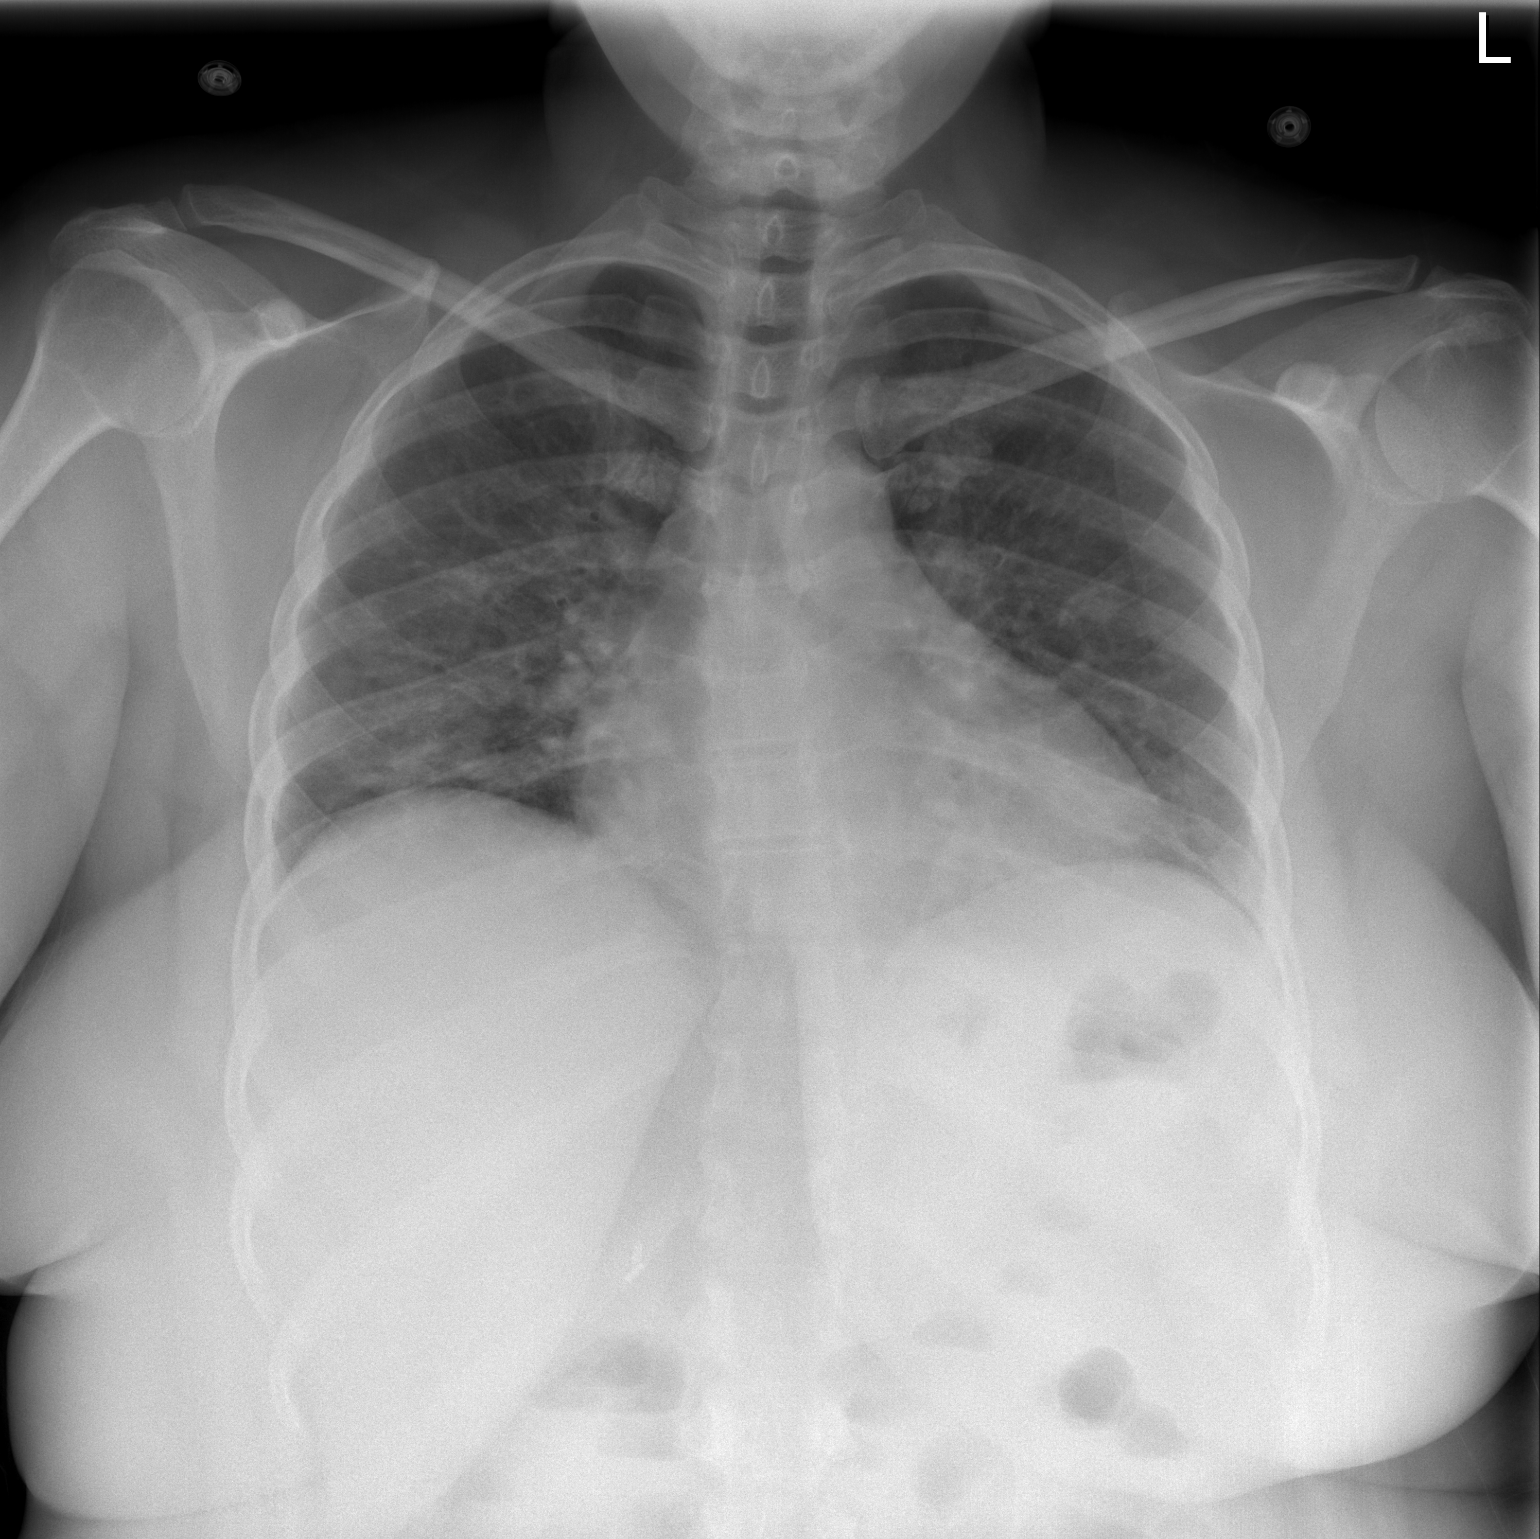

[w chest lat]
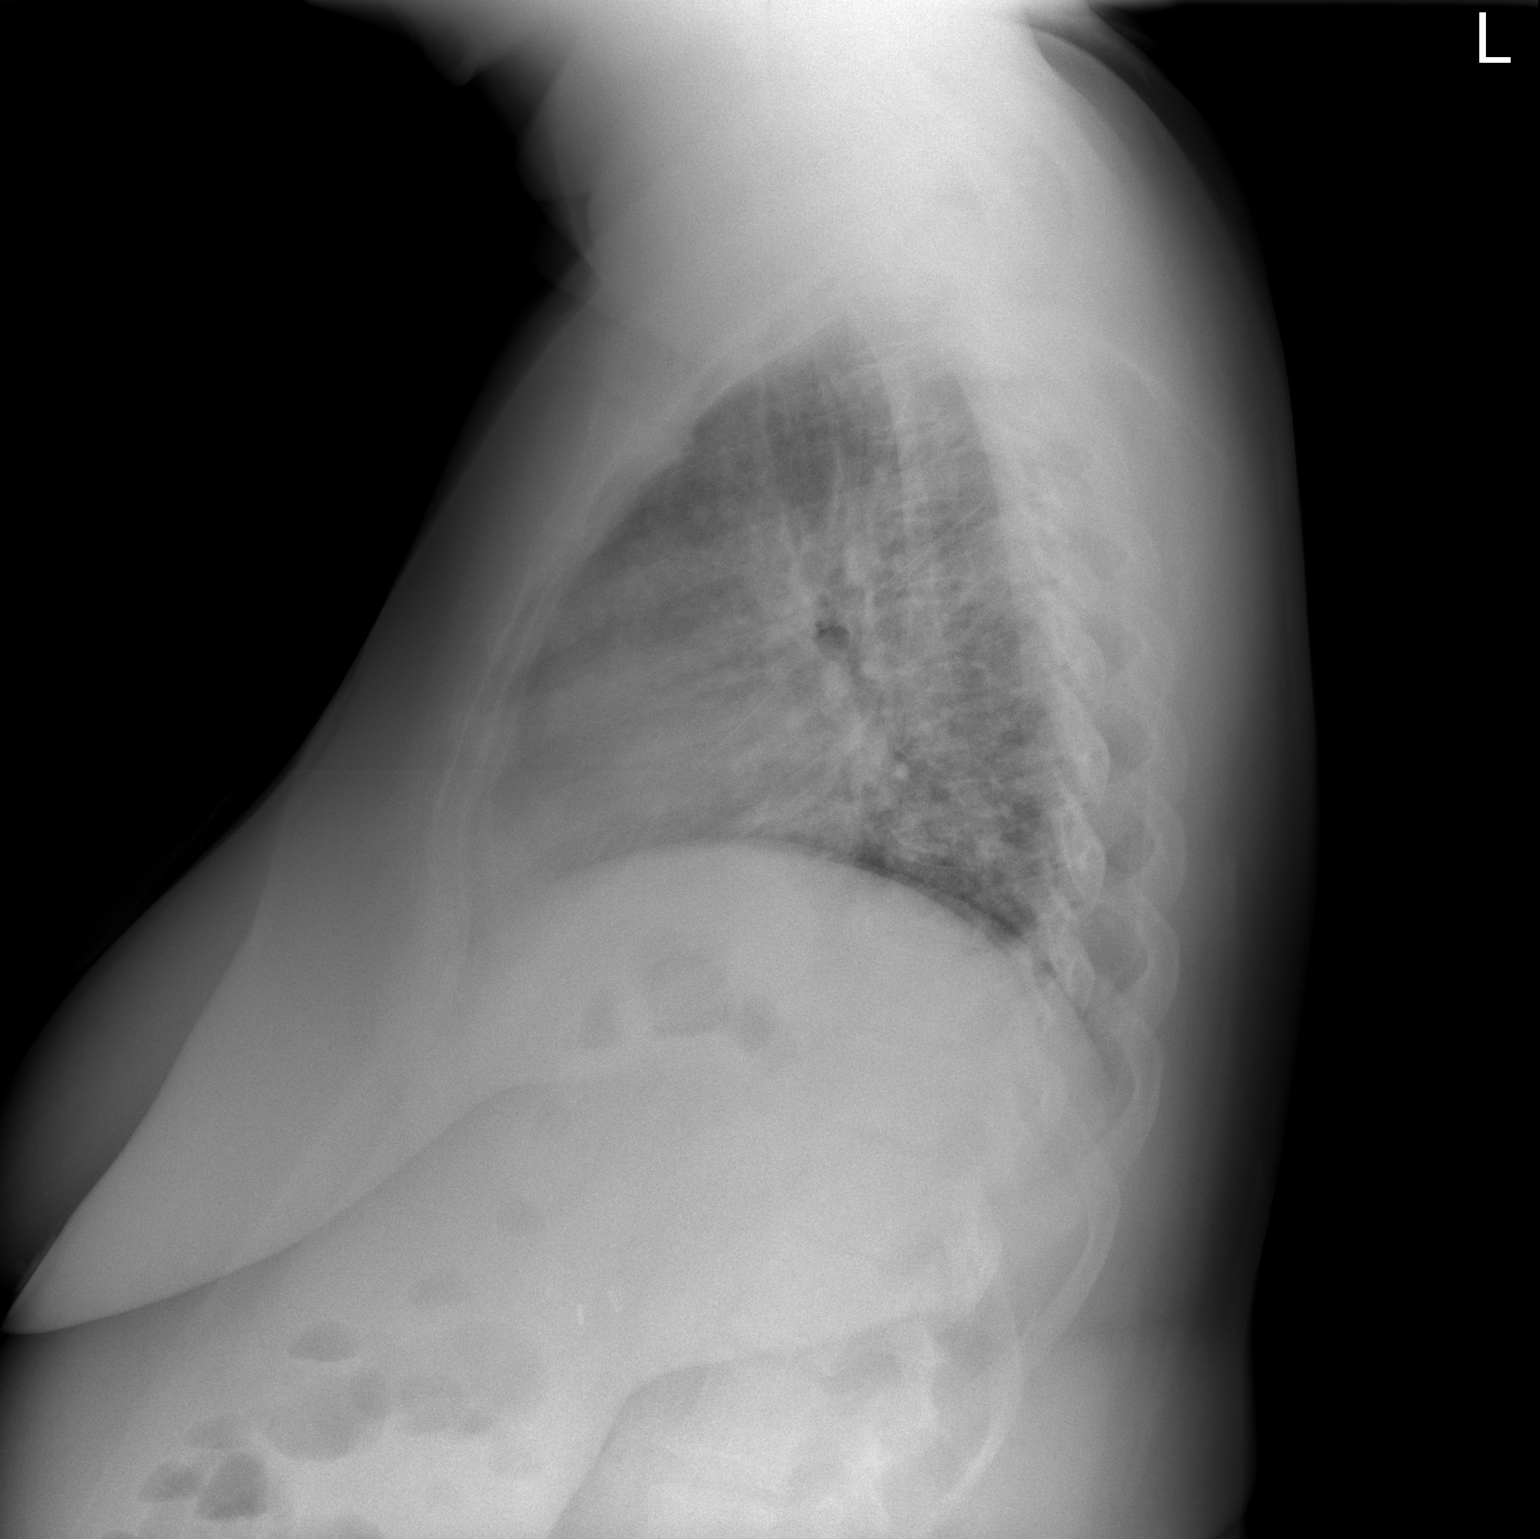

[2 of 2 positions shown; findings below may reference images not displayed]

FINDINGS: Cardiomediastinal silhouette is stable.  There is
improvement in aeration.  No pulmonary edema.  Patchy residual
infiltrates with improvement of right base and left perihilar.  No
new infiltrates noted. Bony thorax is stable.
IMPRESSION: Improvement in aeration.  Patchy residual infiltrate with
improvement right base and left perihilar.  No new infiltrates.  No
pulmonary edema.

## 2010-06-14 ENCOUNTER — Inpatient Hospital Stay (HOSPITAL_COMMUNITY)
Admission: EM | Admit: 2010-06-14 | Discharge: 2010-06-16 | DRG: 287 | Disposition: A | Discharge status: Home / Self Care | Payer: Medicaid Other | Attending: Internal Medicine | Admitting: Internal Medicine

## 2010-06-14 ENCOUNTER — Emergency Department (HOSPITAL_COMMUNITY): Payer: Medicaid Other

## 2010-06-14 DIAGNOSIS — E669 Obesity, unspecified: Secondary | ICD-10-CM | POA: Diagnosis present

## 2010-06-14 DIAGNOSIS — J449 Chronic obstructive pulmonary disease, unspecified: Secondary | ICD-10-CM | POA: Diagnosis present

## 2010-06-14 DIAGNOSIS — E119 Type 2 diabetes mellitus without complications: Secondary | ICD-10-CM | POA: Diagnosis present

## 2010-06-14 DIAGNOSIS — R0789 Other chest pain: Principal | ICD-10-CM | POA: Diagnosis present

## 2010-06-14 DIAGNOSIS — Z87891 Personal history of nicotine dependence: Secondary | ICD-10-CM

## 2010-06-14 DIAGNOSIS — I10 Essential (primary) hypertension: Secondary | ICD-10-CM | POA: Diagnosis present

## 2010-06-14 DIAGNOSIS — F411 Generalized anxiety disorder: Secondary | ICD-10-CM | POA: Diagnosis present

## 2010-06-14 DIAGNOSIS — J4489 Other specified chronic obstructive pulmonary disease: Secondary | ICD-10-CM | POA: Diagnosis present

## 2010-06-14 DIAGNOSIS — E785 Hyperlipidemia, unspecified: Secondary | ICD-10-CM | POA: Diagnosis present

## 2010-06-14 LAB — CBC
HCT: 35.2 % — ABNORMAL LOW (ref 36.0–46.0)
Platelets: 352 10*3/uL (ref 150–400)
RBC: 4.65 MIL/uL (ref 3.87–5.11)
RDW: 16.1 % — ABNORMAL HIGH (ref 11.5–15.5)
WBC: 10.8 10*3/uL — ABNORMAL HIGH (ref 4.0–10.5)

## 2010-06-14 LAB — URINE MICROSCOPIC-ADD ON

## 2010-06-14 LAB — URINALYSIS, ROUTINE W REFLEX MICROSCOPIC
Bilirubin Urine: NEGATIVE
Ketones, ur: NEGATIVE mg/dL
Specific Gravity, Urine: 1.024 (ref 1.005–1.030)
pH: 6 (ref 5.0–8.0)

## 2010-06-14 LAB — DIFFERENTIAL
Basophils Absolute: 0 10*3/uL (ref 0.0–0.1)
Eosinophils Relative: 2 % (ref 0–5)
Lymphocytes Relative: 34 % (ref 12–46)
Lymphs Abs: 3.7 10*3/uL (ref 0.7–4.0)
Neutro Abs: 6.4 10*3/uL (ref 1.7–7.7)
Neutrophils Relative %: 59 % (ref 43–77)

## 2010-06-14 LAB — POCT CARDIAC MARKERS
CKMB, poc: 2.4 ng/mL (ref 1.0–8.0)
Myoglobin, poc: 192 ng/mL (ref 12–200)
Troponin i, poc: <0.05 ng/mL (ref 0.00–0.09)
Troponin i, poc: <0.05 ng/mL (ref 0.00–0.09)

## 2010-06-14 LAB — COMPREHENSIVE METABOLIC PANEL
ALT: 18 U/L (ref 0–35)
AST: 17 U/L (ref 0–37)
Albumin: 3.3 g/dL — ABNORMAL LOW (ref 3.5–5.2)
Calcium: 9.2 mg/dL (ref 8.4–10.5)
GFR calc Af Amer: >60 mL/min (ref >60)
Sodium: 136 mEq/L (ref 135–145)
Total Protein: 7.3 g/dL (ref 6.0–8.3)

## 2010-06-14 LAB — PROTIME-INR: INR: 1.07 (ref 0.00–1.49)

## 2010-06-15 ENCOUNTER — Encounter (HOSPITAL_COMMUNITY): Payer: Self-pay

## 2010-06-15 DIAGNOSIS — R0989 Other specified symptoms and signs involving the circulatory and respiratory systems: Secondary | ICD-10-CM

## 2010-06-15 DIAGNOSIS — R079 Chest pain, unspecified: Secondary | ICD-10-CM

## 2010-06-15 DIAGNOSIS — R0609 Other forms of dyspnea: Secondary | ICD-10-CM

## 2010-06-15 LAB — GLUCOSE, CAPILLARY: Glucose-Capillary: 162 mg/dL — ABNORMAL HIGH (ref 70–99)

## 2010-06-15 LAB — CARDIAC PANEL(CRET KIN+CKTOT+MB+TROPI)
CK, MB: 2.6 ng/mL (ref 0.3–4.0)
CK, MB: 2.7 ng/mL (ref 0.3–4.0)
Relative Index: 0.8 (ref 0.0–2.5)
Relative Index: 0.8 (ref 0.0–2.5)
Total CK: 312 U/L — ABNORMAL HIGH (ref 7–177)
Total CK: 320 U/L — ABNORMAL HIGH (ref 7–177)
Troponin I: 0.02 ng/mL (ref 0.00–0.06)
Troponin I: 0.02 ng/mL (ref 0.00–0.06)

## 2010-06-15 LAB — URINE CULTURE: Culture  Setup Time: 201204170111

## 2010-06-15 LAB — LIPID PANEL
LDL Cholesterol: 104 mg/dL — ABNORMAL HIGH (ref 0–99)
Triglycerides: 49 mg/dL (ref <150)

## 2010-06-15 LAB — HEMOGLOBIN A1C
Hgb A1c MFr Bld: 7.7 % — ABNORMAL HIGH (ref <5.7)
Mean Plasma Glucose: 174 mg/dL — ABNORMAL HIGH (ref <117)

## 2010-06-15 LAB — BRAIN NATRIURETIC PEPTIDE: Pro B Natriuretic peptide (BNP): 53.6 pg/mL (ref 0.0–100.0)

## 2010-06-15 LAB — AMYLASE: Amylase: 90 U/L (ref 0–105)

## 2010-06-15 LAB — TSH: TSH: 1.678 u[IU]/mL (ref 0.350–4.500)

## 2010-06-15 MED ORDER — IOHEXOL 300 MG/ML  SOLN
100.0000 mL | Freq: Once | INTRAMUSCULAR | Status: AC | PRN
  Administered 2010-06-15: 100 mL via INTRAVENOUS

## 2010-06-16 ENCOUNTER — Observation Stay (HOSPITAL_COMMUNITY)
Admission: RE | Admit: 2010-06-16 | Discharge: 2010-06-16 | Disposition: A | Discharge status: Home / Self Care | Payer: Medicaid Other | Source: Ambulatory Visit | Attending: Internal Medicine | Admitting: Internal Medicine

## 2010-06-16 DIAGNOSIS — R079 Chest pain, unspecified: Principal | ICD-10-CM | POA: Insufficient documentation

## 2010-06-16 DIAGNOSIS — E119 Type 2 diabetes mellitus without complications: Secondary | ICD-10-CM | POA: Insufficient documentation

## 2010-06-16 DIAGNOSIS — I10 Essential (primary) hypertension: Secondary | ICD-10-CM | POA: Insufficient documentation

## 2010-06-16 DIAGNOSIS — E785 Hyperlipidemia, unspecified: Secondary | ICD-10-CM | POA: Insufficient documentation

## 2010-06-16 LAB — FOLATE: Folate: 5.6 ng/mL

## 2010-06-16 LAB — COMPREHENSIVE METABOLIC PANEL
BUN: 9 mg/dL (ref 6–23)
CO2: 25 mEq/L (ref 19–32)
Calcium: 8.5 mg/dL (ref 8.4–10.5)
Creatinine, Ser: 0.87 mg/dL (ref 0.4–1.2)
GFR calc non Af Amer: >60 mL/min (ref >60)
Glucose, Bld: 141 mg/dL — ABNORMAL HIGH (ref 70–99)
Sodium: 138 mEq/L (ref 135–145)
Total Protein: 6.1 g/dL (ref 6.0–8.3)

## 2010-06-16 LAB — GLUCOSE, CAPILLARY
Glucose-Capillary: 131 mg/dL — ABNORMAL HIGH (ref 70–99)
Glucose-Capillary: 163 mg/dL — ABNORMAL HIGH (ref 70–99)
Glucose-Capillary: 195 mg/dL — ABNORMAL HIGH (ref 70–99)

## 2010-06-16 LAB — CBC
HCT: 31.5 % — ABNORMAL LOW (ref 36.0–46.0)
Platelets: 324 10*3/uL (ref 150–400)
RDW: 16.5 % — ABNORMAL HIGH (ref 11.5–15.5)
WBC: 9.5 10*3/uL (ref 4.0–10.5)

## 2010-06-16 LAB — CARDIAC PANEL(CRET KIN+CKTOT+MB+TROPI)
CK, MB: 1.9 ng/mL (ref 0.3–4.0)
Total CK: 270 U/L — ABNORMAL HIGH (ref 7–177)
Troponin I: 0.01 ng/mL (ref 0.00–0.06)

## 2010-06-16 LAB — IRON AND TIBC
Iron: 25 ug/dL — ABNORMAL LOW (ref 42–135)
TIBC: 285 ug/dL (ref 250–470)

## 2010-06-16 LAB — SEDIMENTATION RATE: Sed Rate: 36 mm/hr — ABNORMAL HIGH (ref 0–22)

## 2010-06-16 LAB — VITAMIN B12: Vitamin B-12: 337 pg/mL (ref 211–911)

## 2010-06-16 NOTE — Discharge Summary (Signed)
Cheyenne Alvarez, Cheyenne Alvarez               ACCOUNT NO.:  [id number]  MEDICAL RECORD NO.:  [id number]           PATIENT TYPE:  O  LOCATION:  [facility]                         FACILITY:  [facility]  PHYSICIAN:  Talmage Nap, MD  DATE OF BIRTH:  September 13, 1965  DATE OF ADMISSION:  06/14/2010 DATE OF DISCHARGE:  06/16/2010                        DISCHARGE SUMMARY - REFERRING   PRIMARY CARE PHYSICIAN:  [facility].  CONSULTANTS:  Cardiology.  DISCHARGE DIAGNOSES: 1. Chest pain, status post cardiac catheterization.  Normal coronaries     as per the cardiologist. 2. Diabetes mellitus. 3. Hypertension. 4. Family history of coronary artery disease. 5. Gastroesophageal reflux disease. 6. Anxiety disorder. 7. Obesity. 8. Bipolar affective disorder. 9. Venostasis lower extremity.  BRIEF HISTORY AND PHYSICAL:  The patient is a 45 year old female with history of diabetes mellitus and hypertension as well as bipolar affective disorder, obese, COPD, who was admitted to the hospital on June 14, 2010, by Dr. Marguerite Olea with complaints of chest pain with associated shortness of breath.  There was, however, no history of nausea or vomiting or diaphoresis.  The patient was said to have been given nitroglycerin and the chest pain was relieved.  She also denied any systemic symptoms, i.e., no fever, no chills, no rigor and was subsequently admitted to be ruled out for acute coronary syndrome.  PREADMISSION MEDICATIONS INCLUDE: 1. Metformin 500 mg p.o. b.i.d. 2. Atenolol 25 mg p.o. b.i.d. 3. Lorazepam 0.5 mg p.o. b.i.d. 4. Lasix 20 mg daily. 5. Nexium 40 mg p.o. daily. 6. Aspirin 81 mg p.o. daily. 7. Prozac 20 mg p.o. daily. 8. Glucotrol 5 mg p.o. daily. 9. Naproxen. 10.Risperdal 3 mg half a tablet p.o. at bedtime. 11.Hydroxyzine. 12.Depakote ER 500 mg 2 tablets p.o. at bedtime. 13.Gabapentin. 14.Symbicort.  ALLERGIES:  DEMEROL, PEPCID, and PRILOSEC.  PAST SURGICAL HISTORY:  Positive for  cholecystectomy and hysterectomy.  SOCIAL HISTORY:  The patient is said to be divorced, has 4 children. Quit smoking about 5 months ago after smoking for about 30 years.  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  Denies any history of alcohol or street drug use.  FAMILY HISTORY:  Mother died at the age of 9 from complication of diabetes mellitus as well as congestive heart failure.  Father died at the age of 67 from complication of a seizure.  REVIEW OF SYSTEMS:  Essentially as documented in the initial history and physical.  At the time the patient was seen by the admitting physician, she was said not to be in any distress.  Her physical exam, VITAL SIGNS: Blood pressure was 120/80, heart rate 80, respiratory rate 18, temperature 98.1.  HEENT:  Pupils were reactive to light and extraocular muscles were intact.  NECK:  No jugular venous distention.  No carotid bruit.  No lymphadenopathy.  CHEST:  Reproducible tenderness over the right costosternal margin and left lung lungs were clear to auscultation.  HEART:  Sounds were 1 and 2.  ABDOMEN:  Obese, nontender. Liver, spleen tip not palpable.  Bowel sounds are positive. EXTREMITIES:  No pedal edema.  NEUROLOGIC:  Nonfocal.  MUSCULOSKELETAL: Unremarkable.  SKIN:  Warm and  dry.  LABORATORY AND RADIOLOGIC DATA:  Initial complete blood count with differential showed WBC of 10.8, hemoglobin of 11.6, hematocrit 35.2, MCV of 75.7, platelet count of 352.  Cardiac markers, troponin-I less than 0.05, 0.02 and 0.01 respectively.  CK-MB were all normal. Coagulation profile showed PT 14.1, INR 1.07 and APTT of 30. Urinalysis, unremarkable.  Urine microscopy, unremarkable. Comprehensive metabolic panel showed sodium of 136, potassium of 3.9, chloride of 102 with a bicarb of 26, glucose 166, BUN 18, creatinine 0.87, hemoglobin A1c 7.7.  Lipid panel showed total cholesterol 162, triglyceride 49, HDL cholesterol 48, LDL cholesterol 104 and  VLDL cholesterol is 10.  TSH is 1.678.  Amylase 90, lipase 103.  BNP 53.6. Urine culture grew multiple bacterial morphotypes.  ESR 36.  A repeat cardiac marker, troponin-I done on June 16, 2010 was 0.01. Comprehensive metabolic panel showed sodium of 138, potassium of 3.7, chloride of 105 with a bicarb of 25, glucose is 141, BUN is 9, creatinine is 0.87.  C-reactive protein 5.8.  Anemia panel showed serum iron 25, total iron binding capacity 285, percentage saturation is 9, UIBC 260.  Vitamin B12 337.  Serum folate is 5.6.  Chest x-ray done on June 14, 2010, showed increased central bronchial wall thickening which may be seen with bronchitis or reactive airway disease, no focal pulmonary opacification seen.  And, CT angiogram of the chest did not show any evidence pulmonary embolism.  EKG as per the initial history and physical dictated by Dr. Marguerite Olea was said to have showed normal sinus rhythm without any acute ST-wave change.  HOSPITAL COURSE:  The patient was admitted to [facility].  She was started on aspirin 325 mg p.o. daily, atenolol 25 mg p.o. b.i.d., nitroglycerin patch q.8 hourly 1/2 inch, and diazepam 5 mg p.o. x1 dose. She was also placed on insulin sliding scale and also labetalol 10 mg IV x1 dose.  The patient was also given prednisone 16 mg p.o. x1 dose and restarted on risperidone 1.5 mg p.o. at bedtime.  In addition, she was also given Depakote 100 mg p.o. at bedtime.  The patient was, however, assigned to me at [facility] on June 16, 2010, and before the patient could be evaluated by me, she was moved from [facility] to [facility] for cardiac catheterization.  I, however, got a call from the nurse assisting the cardiologist that cardiac catheterization was normal and therefore the patient could be discharged directly from [facility].  At this point I informed the floor manager, who in turn assigned the patient to one of the admitting physicians  and ultimately the patient will be discharged home from [facility].     Talmage Nap, MD     CN/MEDQ  D:  06/16/2010  T:  06/16/2010  Job:  [id number]  Electronically Signed by Talmage Nap  on 06/16/2010 06:55:21 PM

## 2010-06-18 NOTE — H&P (Signed)
NAMEPRIYA, Cheyenne Alvarez               ACCOUNT NO.:  [id number]  MEDICAL RECORD NO.:  [id number]           PATIENT TYPE:  E  LOCATION:  WLED                         FACILITY:  [facility]  PHYSICIAN:  Houston Siren, MD           DATE OF BIRTH:  1965/12/23  DATE OF ADMISSION:  06/14/2010 DATE OF DISCHARGE:                             HISTORY & PHYSICAL   PRIMARY CARE PHYSICIAN:  [facility].  ADVANCE DIRECTIVE:  Full code.  REASON FOR ADMISSION:  Chest pain.  HISTORY OF PRESENT ILLNESS:  This is a 45 year old female with obesity, emphysema, GERD, anxiety, type 2 diabetes, hypertension, who presents to [facility] complaining of chest pain and shortness of breath.  She does have anxiety, and originally she thought she had a panic attack.  She complained of substernal chest pain, nonradiating, with increased shortness of breath but no nausea, vomiting or diaphoresis.  She was given nitroglycerin without relief.  She has had no fever, chills or any productive cough.  Palpation of her anterior chest wall seems to elicit some pain.  She did not describe this as pleuritic.  She has no calf swelling or tenderness.  She denied any distant travel.  Workup in the emergency room showed a clear chest x-ray, unremarkable EKG, and negative cardiac markers.  Other tests include normal white count, hemoglobin, urinalysis and creatinine.  Her blood glucose is 166. Hospitalist was asked to admit the patient for rule out due to her increased cardiac risk factors.  PAST MEDICAL HISTORY:  As above.  FAMILY HISTORY:  Mother with congestive heart failure.  Sister with lung cancer and she has diabetes in family.  FAMILY HISTORY AND SOCIAL HISTORY:  She is divorced with several children.  She quit smoking 6 months ago and denied any alcohol or drug use.  ALLERGIES:  DEMEROL, PEPCID, and PRILOSEC.  CURRENT MEDICATIONS: 1. Metformin 500 mg b.i.d. 2. Atenolol 25 mg b.i.d. 3. Lorazepam 0.5 mg b.i.d. 4. Lasix 20  mg per day. 5. Nexium 40 mg per day. 6. Aspirin 81 mg per day. 7. Prozac 20 mg per day. 8. Glucotrol 5 mg. 9. Naprosyn. 10.Risperdal 3 mg half tablet p.o. q.h.s. 11.Hydroxyzine. 12.Depakote ER 500 mg 2 tablets p.o. q.h.s. 13.Gabapentin. 14.Symbicort.  PHYSICAL EXAMINATION:  VITAL SIGNS:  Physical exam showed blood pressure 120/80, heart rate of 80, respiratory rate of 18, temp 98.1. GENERAL:  She is in no apparent distress and is comfortable. HEENT:  She has facial symmetry.  Speech is fluent.  Throat is clear. Tongue is midline.  No carotid bruit. CHEST:  Palpation of her chest wall has slight tenderness over the right costal sternal junction.  She has no friction rub or any murmur. LUNGS:  Clear.  No wheezes, rales or any evidence of consolidation. ABDOMEN:  Abdominal exam is quite obese, nondistended, nontender.  No right upper quadrant pain.  No palpable mass.  No rebound. EXTREMITIES:  No edema.  She has no calf tenderness.  Good distal pulses bilaterally. SKIN:  Warm and dry. PSYCHIATRIC:  Exam shows she is not suicidal.  She is slightly anxious and not excessively  depressed.  OBJECTIVE FINDINGS:  Serum sodium 136, potassium 3.9, glucose 166, creatinine 0.87, troponin less than 0.05.  EKG showed normal sinus rhythm without any acute ST-T changes.  Chest x-ray showed increased bronchial thickening consistent with bronchitis or active airway disease.  No pulmonary opacity.  [place] [address], hemoglobin 11.6, platelet count of 352000.  IMPRESSION:  This is a 45 year old obese, diabetic, hypertensive patient presenting with chest pain.  She certainly has increased risks for coronary artery disease, but her clinical presentation made me think this is not an acute coronary syndrome.  Nevertheless, we will admit her to telemetry.  We will rule out with serial CPKs and troponins.  We will treat with an aspirin and nitroglycerin paste.  She is already on beta blocker, and I  will continue atenolol as she had before.  She will need a CT pulmonary angiogram to rule out PE.  She is also at risk for PE as well.  Otherwise,we will treat her for her GERD and continue her other medications as soon as they are reconciled.  I would like to get an echo for heart just to assess her for any wall motion abnormality, if any.  She is a full code.  Will be admitted to [facility].  She is stable.  I encouraged her to continue abstinence from tobacco use.  She should exercise a bit and try to lose some weight as well.     Houston Siren, MD     PL/MEDQ  D:  06/14/2010  T:  06/15/2010  Job:  [id number]  Electronically Signed by Houston Siren  on 06/18/2010 04:10:41 AM

## 2010-06-22 NOTE — Consult Note (Addendum)
Cheyenne Alvarez, Cheyenne Alvarez               ACCOUNT NO.:  [id number]  MEDICAL RECORD NO.:  [id number]           PATIENT TYPE:  I  LOCATION:  1421                         FACILITY:  [facility]  PHYSICIAN:  Cassell Clement, M.D. DATE OF BIRTH:  12-15-1965  DATE OF CONSULTATION:  06/15/2010 DATE OF DISCHARGE:                                CONSULTATION   PRIMARY CARDIOLOGIST:  New.  PRIMARY MEDICAL DOCTOR:  Dr. Daphine Deutscher with [facility].  HISTORY OF PRESENT ILLNESS:  Cheyenne Alvarez is a 45 year old female with a history of diabetes, hypertension, prior tobacco abuse, and family history of heart disease who states she had a stress test approximately 1 year ago and which she was told that she needed to have a followup heart catheterization.  At that time, however, she became very anxious about having to lie flat, so she denies the procedure.  Records obtained from the [facility] show that she did have a test done in 2007 that showed diaphragmatic attenuation, could not exclude subtle apical ischemia, but no records are available from last year.  Attempts to contact her prior primary care provider from that timeframe also are unrevealing.  She presents to [facility] with approximately 1 week history of progressive dyspnea on exertion as well as chest discomfort which she describes as a heaviness, which is particularly worse with exertion, intermittent, nonradiating, and sometimes associated with nausea as well as diaphoresis.  It is relieved by resting, combing herself, and sitting still.  She also has history of lower extremity edema for which she takes Lasix, but this has not been as much as problem recently.  Cardiac enzymes show negative troponin x3 with some elevation of CK, but negative MBs.  EKG is unremarkable.  She had a CT angio that ruled out pulmonary embolism, but she feels like she did have a reaction to the dye this admission with nausea, vomiting,  and headache.  PAST MEDICAL HISTORY: 1. GERD. 2. Anxiety/bipolar disorder. 3. Diabetes mellitus. 4. Hypertension. 5. Obesity. 6. COPD. 7. Lower extremity edema. 8. EF of 60-65% with no wall motion abnormalities by echo on June 15, 2010, with grade 1 diastolic dysfunction, mild MR, moderate TR, and     PA pressure of 41 mmHg.  SURGERIES:  Cholecystectomy, hysterectomy, and leg fracture.  OUTPATIENT MEDICATIONS: 1. Zofran p.r.n. 2. Gabapentin 300 mg daily. 3. Fluoxetine 20 mg daily. 4. Risperdal 1.5 mg daily. 5. Ambien 10 mg nightly. 6. Hydroxyzine nightly p.r.n. 7. Ativan 0.5 mg b.i.d. 8. Atenolol 25 mg b.i.d. 9. Metformin 500 mg b.i.d. 10.Lasix 20 mg 2 tablets daily. 11.Multivitamin. 12.Aspirin 81 mg daily. 13.Naproxen 500 mg daily. 14.Nexium 40 mg b.i.d. 15.Glipizide 5 mg daily.  INPATIENT MEDICATIONS: 1. Aspirin 325 mg. 2. Atenolol 25 mg b.i.d. 3. Depakote 1000 mg nightly. 4. Enoxaparin 60 mg daily. 5. Hydromorphone x1. 6. NovoLog sliding scale insulin. 7. Ativan 2.5 mg b.i.d. 8. Nitroglycerin paste 1 inch TD q.8 h. 9. Zofran 4 mg x1. 10.Protonix 80 mg daily. 11.Risperdal 1.5 mg daily nightly.  ALLERGIES:  DEMEROL and PEPCID and most recently she had a reaction  to IV DYE with nausea, vomiting, and headache last night.  SOCIAL HISTORY:  Cheyenne Alvarez lives by herself.  She is divorced.  She has 4 children.  She quit smoking 5 months ago and smoked for 30 years. She is a retired Scientist, clinical (histocompatibility and immunogenetics).  She denies any alcohol or drug use.  FAMILY HISTORY:  Mother died at age 76 of CHF and diabetes.  Father died at age 75 of a seizure.  Sister had some sort of lung disease secondary to drug use and died in Feb 06, 2010.  REVIEW OF SYSTEMS:  No fevers, chills, palpitations, or syncope.  Please see HPI for pertinent positives.  All other systems are reviewed and otherwise negative.  LABORATORY DATA:  WBC 10.8, hematocrit 11.6, hematocrit 35.2, and platelet count 352.   Sodium 136, potassium 3.9, chloride 102, CO2 of 26, glucose 166, BUN 8, and creatinine 0.87.  LFTs were within normal limits with the exception of decreased albumin 3.3.  A1c 7.7.  Cardiac enzymes negative x3 except for CK 312 and 320.  TSH within normal limits.  EKG:  Normal sinus, possible Q-waves in lead III with no other acute changes.  STUDIES: 1. CT angio showed no PE.  Atelectasis.  No active cardiopulmonary     disease.  LV enlargement. 2. Chest x-ray showed increased bronchial wall thickening, question of     bronchitis versus reactive airway disease.  PHYSICAL EXAMINATION:  VITAL SIGNS:  Temperature 97.9, pulse 88, respirations 24, blood pressure 121/64, and pulse ox 96% on room air. GENERAL:  This is a pleasant African American female in no acute distress. HEENT:  Normocephalic and atraumatic.  Extraocular movements are intact and clear sclerae.  Nares are without discharge. Dentition is poor with several missing teeth. NECK:  Supple. HEART:  Auscultation of the heart reveals regular rate and rhythm with S1 and prominent S2 with a soft systolic ejection murmur. LUNGS:  She has prolonged expiratory phase with a quiet and expiratory wheeze.  No rales or rhonchi. ABDOMEN:  Soft, nontender, and nondistended.  Positive bowel sounds. EXTREMITIES:  Warm and dry without edema.  She has 2+ pedal pulses bilaterally. NEUROLOGICAL:  She is alert and oriented x3.  Responds questions appropriately with a normal affect.  ASSESSMENT/PLAN:  The patient was seen and examined by Dr. Patty Sermons and myself.  This is a 45 year old female with a history of multiple cardiac risk factors including hypertension, diabetes, tobacco abuse, and family history of heart disease who has no prior diagnosis of coronary artery disease.  However, she relates being told in the past that she would require heart catheterization after an abnormal nuclear study.  We had a stress test in 2007 that suggested  diaphragmatic attenuation, could not exclude subtle apical ischemia.  She presents with worsening exertional dyspnea with a feeling of bricks lying upon her chest.  She refuses to consider another Myoview secondary to her bad experience with prior one in 2007.  She is agreeable to cardiac catheterization and her large body habitus may make mildly less reliable anyway.  We would favor proceeding with cardiac catheterization in the morning.  There is a question of dye allergy with a CT angio last night, so we will premedicate her with prednisone, Benadryl, and famotidine.  We will also check a BMP to assess her volume status given her lower extremity edema.  Risks versus benefits and alternatives to the procedure were discussed with the patient who agrees to proceed.  This will be set up for tomorrow  morning.  Thank you for the opportunity to participate in the patient's care.     Cheyenne Alvarez, P.A.C.   ______________________________ Cassell Clement, M.D.    DD/MEDQ  D:  06/15/2010  T:  06/16/2010  Job:  [id number]  cc:   Dr. Daphine Deutscher  Electronically Signed by Cassell Clement M.D. on 06/22/2010 11:48:29 AM Electronically Signed by Cheyenne Alvarez  on 06/22/2010 07:20:42 PM

## 2010-06-29 NOTE — Cardiovascular Report (Signed)
  [name], Alvarez               ACCOUNT NO.:  [id number]  MEDICAL RECORD NO.:  [id number]           PATIENT TYPE:  O  LOCATION:  6522                         FACILITY:  [facility]  PHYSICIAN:  Bevelyn Buckles. Shavell Nored, MDDATE OF BIRTH:  11/04/1965  DATE OF PROCEDURE:  06/16/2010 DATE OF DISCHARGE:  06/16/2010                           CARDIAC CATHETERIZATION   PATIENT IDENTIFICATION:  [name] is a very pleasant 45 year old woman who is followed at [facility].  She has a history of morbid obesity, hypertension, hyperlipidemia, and recent-onset diabetes.  She was admitted with chest pain.  Cardiac enzymes and EKG were normal. However, given her size, it was felt she was not a good candidate for stress testing, so she is referred for cardiac catheterization.  PROCEDURES PERFORMED: 1. Selective coronary angiography. 2. Left heart cath. 3. Left ventriculogram.  DESCRIPTION OF PROCEDURE:  Risks and indication were explained.  Consent was signed and placed on the chart.  Given her history of a CONTRAST allergy, she was premedicated for the catheterization prior to the procedure.  After confirmation of a normal Allen test on the right wrist, the area was prepped and draped in routine sterile fashion, anesthetized with 1% local lidocaine.  A 5-French arterial sheath was placed using modified Seldinger technique.  She was treated with 5000 units of systemic heparin and 3 mg of intra-arterial verapamil. Standard catheters including a JL-3.5, JR-4, and angled pigtail were used.  All catheter exchanges were made over a wire.  No apparent complications.  HEMODYNAMICS:  Central aortic pressure 179/99.  LV pressure 196/20 with an EDP of 25.  There was no aortic stenosis.  Left main was normal.  LAD was a long vessel wrapping the apex.  It gave off 2 diagonal branches, it is angiographically normal.  Left circumflex gave off a large ramus branch, 2 OMs and a posterolateral.  They were  angiographically normal.  Right coronary artery was a dominant vessel, gave off a moderate-sized PDA and several small posterolaterals, is angiographically normal.  Left ventriculogram done in the RAO position showed an EF of 60% with no regional wall motion abnormalities.  ASSESSMENT: 1. Normal coronary arteries. 2. Normal left ventricular function. 3. Significant hypertension with elevated left ventricular end-     diastolic pressure.  PLAN/DISCUSSION:  [name] coronaries are normal.  I suspect her chest pain may be related to her increased left ventricular filling pressures.  Given her significant hypertension, we would consider adding a diuretic such as spironolactone or hydrochlorothiazide.  Otherwise, she is stable from a cardiac perspective and is appropriate for discharge with followup with her primary care physician.     Bevelyn Buckles. Daryon Remmert, MD     DRB/MEDQ  D:  06/16/2010  T:  06/17/2010  Job:  [id number]  Electronically Signed by Arvilla Meres MD on 06/29/2010 07:16:57 PM
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
## 2010-07-22 ENCOUNTER — Ambulatory Visit (HOSPITAL_COMMUNITY)
Admission: RE | Admit: 2010-07-22 | Discharge: 2010-07-22 | Disposition: A | Discharge status: Home / Self Care | Payer: Medicaid Other | Source: Ambulatory Visit | Attending: Internal Medicine | Admitting: Internal Medicine

## 2010-07-22 DIAGNOSIS — R0602 Shortness of breath: Secondary | ICD-10-CM | POA: Insufficient documentation

## 2010-11-15 ENCOUNTER — Other Ambulatory Visit (HOSPITAL_COMMUNITY): Payer: Self-pay | Admitting: Obstetrics

## 2010-11-15 DIAGNOSIS — Z1231 Encounter for screening mammogram for malignant neoplasm of breast: Secondary | ICD-10-CM

## 2010-11-15 DIAGNOSIS — R1032 Left lower quadrant pain: Secondary | ICD-10-CM

## 2010-11-15 IMAGING — CT CT ANGIO CHEST
2 of 6 series · 19 of 36 positions shown · IV contrast (agent unspecified)
Comparison: CT chest 03/24/2009

CLINICAL DATA: Pneumonia.  Chest pain and shortness of breath.

CT ANGIOGRAPHY CHEST WITH CONTRAST
TECHNIQUE: Multidetector CT imaging of the chest was performed
using the standard protocol during bolus administration of
intravenous contrast.  Multiplanar CT image reconstructions
including MIPs were obtained to evaluate the vascular anatomy.
Contrast:  100 ml of [id number]-[id number]

[Series 6: pe thins @ 1mm · axial · 0.65mm/px · z∈[-178,-6]mm · 18 of 192 slices shown]
[im 10/192  lung]
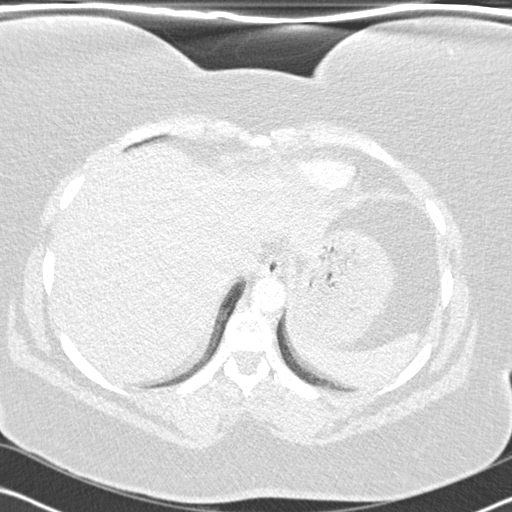
[im 20/192  mediastinal]
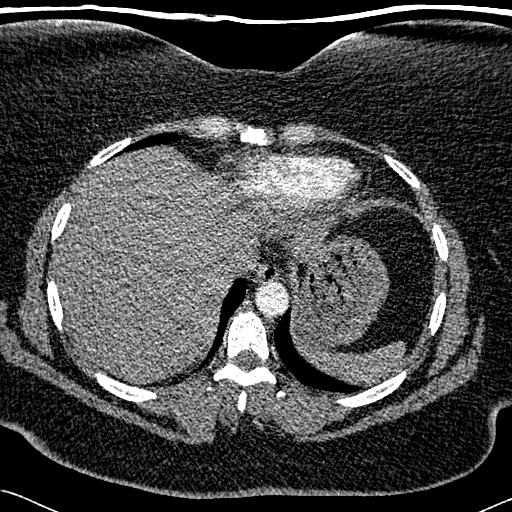
[im 29/192  lung]
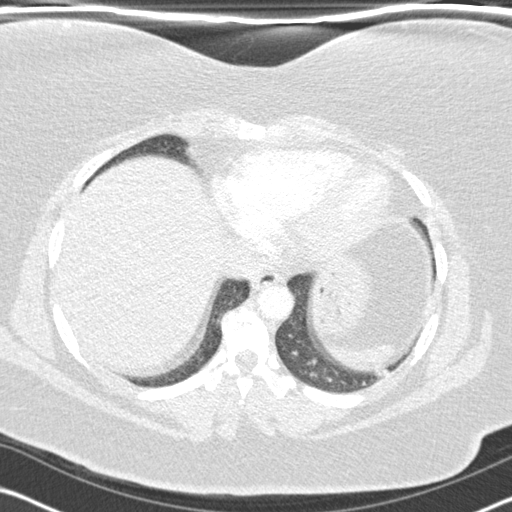
[im 39/192  mediastinal]
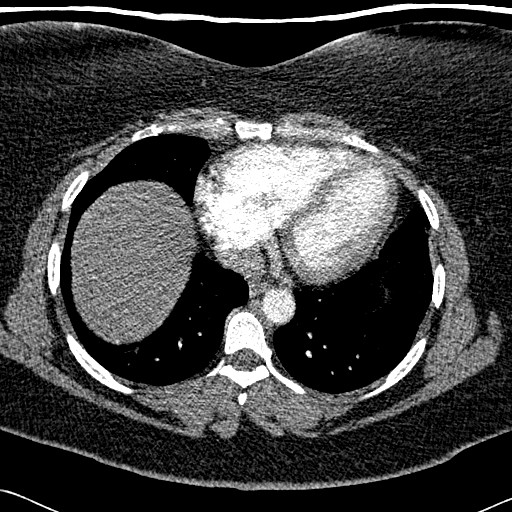
[im 48/192  lung]
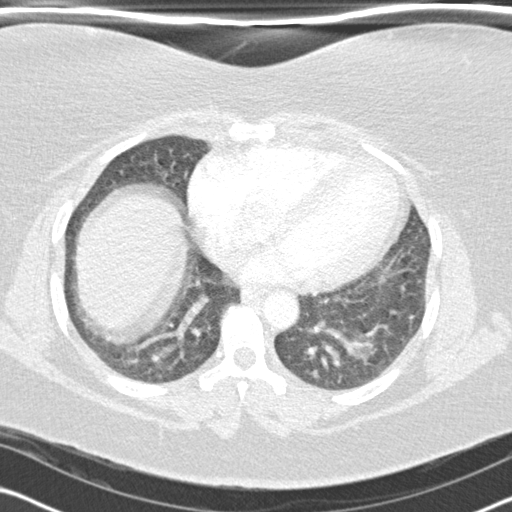
[im 58/192  mediastinal]
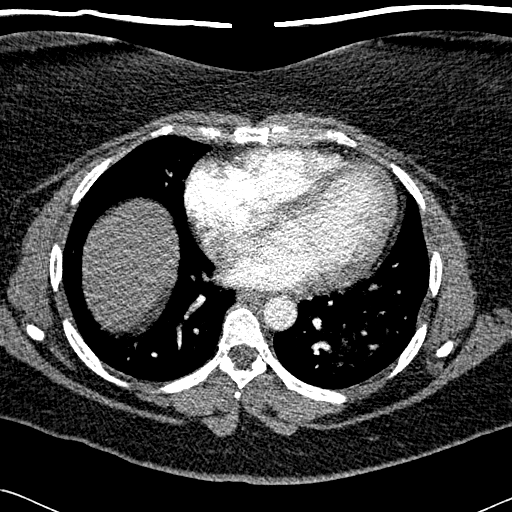
[im 67/192  lung]
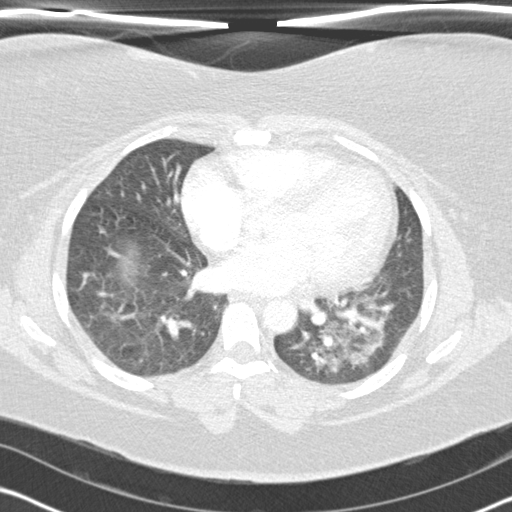
[im 77/192  mediastinal]
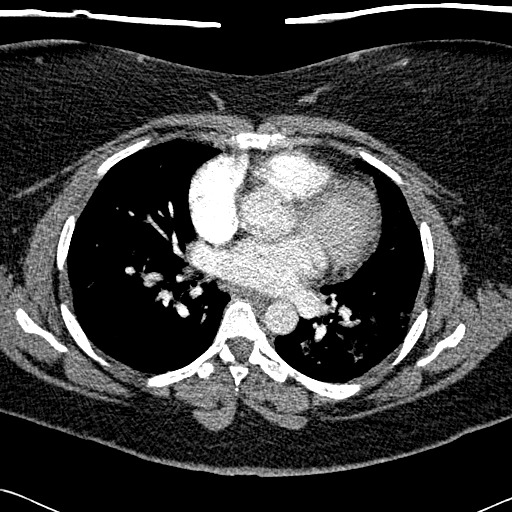
[im 86/192  lung]
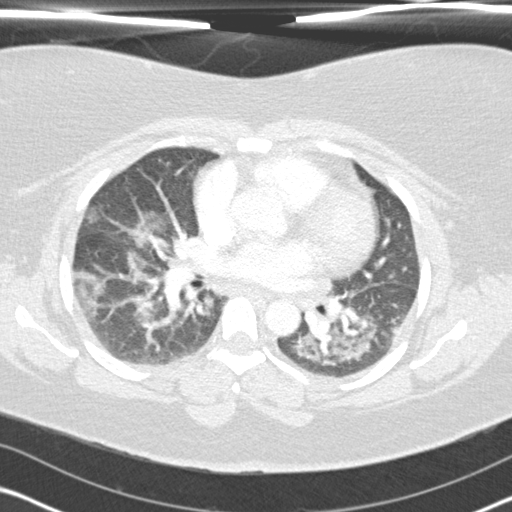
[im 106/192  mediastinal]
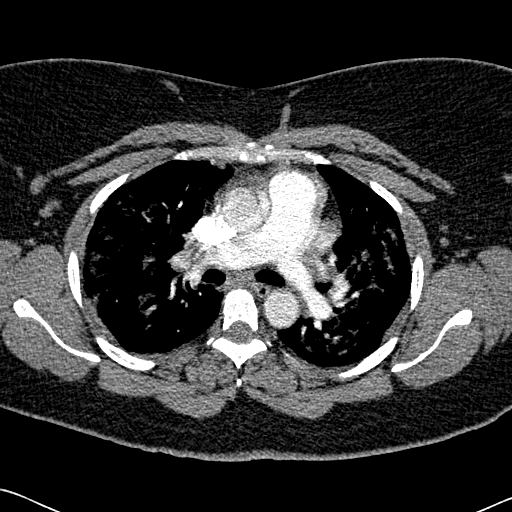
[im 115/192  lung]
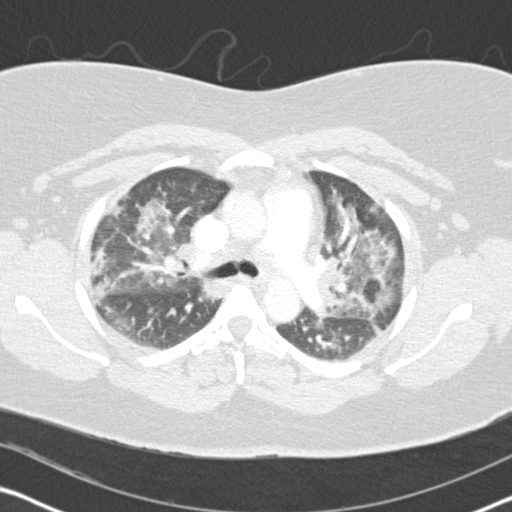
[im 125/192  mediastinal]
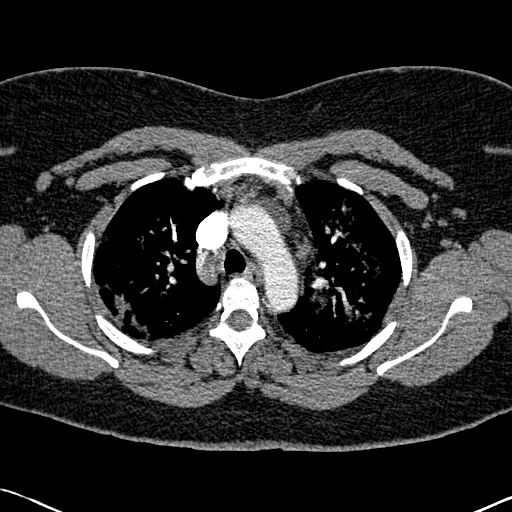
[im 134/192  lung]
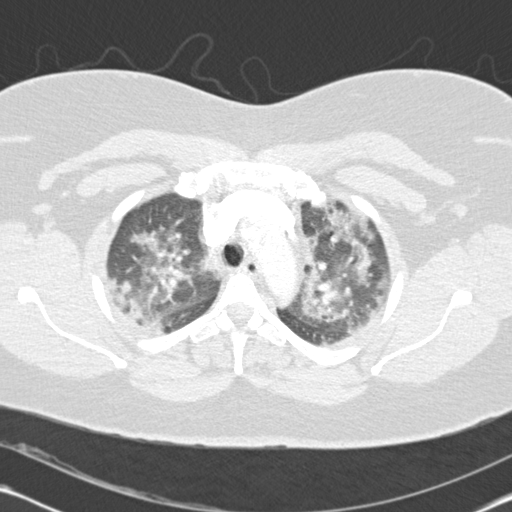
[im 144/192  mediastinal]
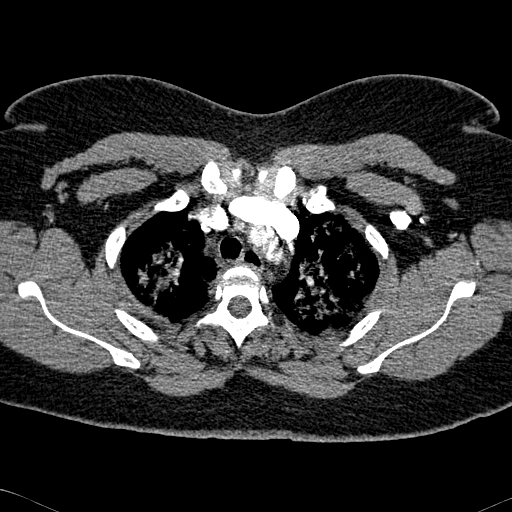
[im 153/192  lung]
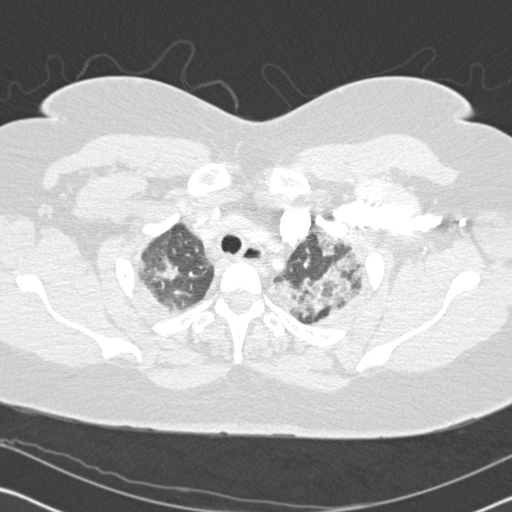
[im 163/192  mediastinal]
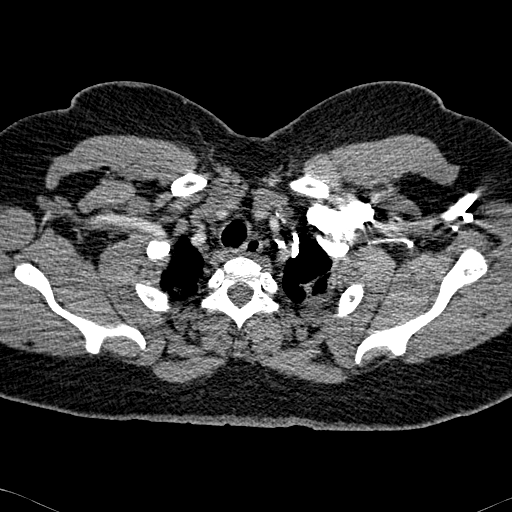
[im 172/192  lung]
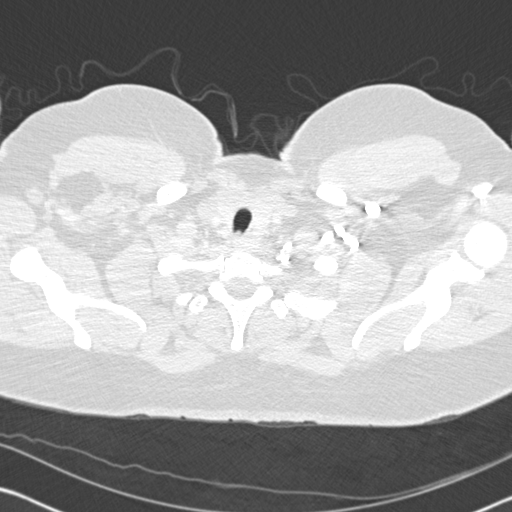
[im 182/192  mediastinal]
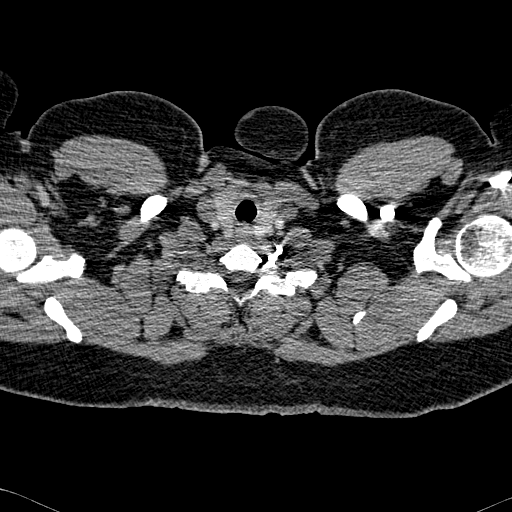

[Series 602: <mpr thick range> · coronal · 0.65mm/px · 1 of 94 slices shown]
[im 47/94  mediastinal]
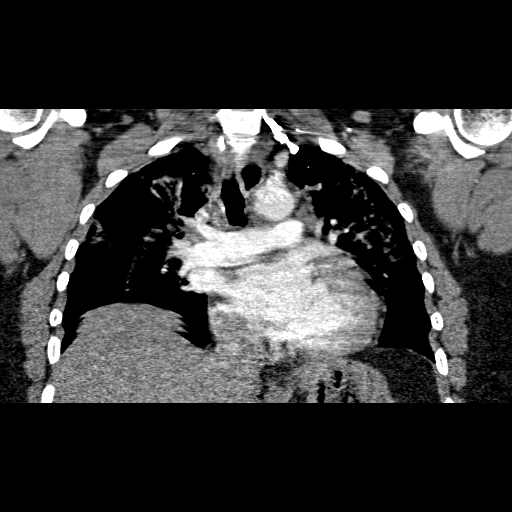

[19 of 36 positions shown; findings below may reference images not displayed]

FINDINGS: The chest wall is unremarkable and stable.  There are
small bilateral axillary lymph nodes which are unchanged.  The bony
thorax is intact.

The heart is borderline enlarged but stable.  No pericardial
effusion.  Stable borderline enlarged mediastinal and hilar lymph
nodes.  The aorta is normal in caliber.  No dissection.

The pulmonary are.  A tree is fairly well opacified.  No definite
filling defects to suggest pulmonary emboli.  Examination is
limited by breathing motion artifact.

Examination of the lung parenchyma demonstrates patchy bilateral
airspace opacities with a slight upper lobe predominance.  Findings
are similar to the prior chest CT.  Possibilities include
sarcoidosis, recurrent pneumonia, eosinophilic pneumonia and
bronchiolitis obliterans with organizing pneumonia.  No pleural
effusion or pulmonary edema.

The upper abdomen is grossly normal.

Review of the MIP images confirms the above findings.
IMPRESSION: 1.  No CT findings for pulmonary embolism.  Exam somewhat limited
by breathing motion artifact.
2.  Normal thoracic aorta.
3.  Borderline enlarged mediastinal and hilar lymph nodes and
persistent or recurrent patchy bilateral airspace process as
discussed above.

## 2010-11-23 ENCOUNTER — Ambulatory Visit (HOSPITAL_COMMUNITY): Admission: RE | Admit: 2010-11-23 | Payer: Medicaid Other | Source: Ambulatory Visit | Admitting: Gastroenterology

## 2010-11-26 ENCOUNTER — Ambulatory Visit (HOSPITAL_COMMUNITY)
Admission: RE | Admit: 2010-11-26 | Discharge: 2010-11-26 | Disposition: A | Discharge status: Home / Self Care | Payer: Medicaid Other | Source: Ambulatory Visit | Attending: Obstetrics | Admitting: Obstetrics

## 2010-11-26 DIAGNOSIS — Z90710 Acquired absence of both cervix and uterus: Secondary | ICD-10-CM | POA: Insufficient documentation

## 2010-11-26 DIAGNOSIS — N949 Unspecified condition associated with female genital organs and menstrual cycle: Secondary | ICD-10-CM | POA: Insufficient documentation

## 2010-11-26 DIAGNOSIS — R1032 Left lower quadrant pain: Secondary | ICD-10-CM | POA: Insufficient documentation

## 2010-11-26 DIAGNOSIS — Z1231 Encounter for screening mammogram for malignant neoplasm of breast: Secondary | ICD-10-CM

## 2010-12-16 ENCOUNTER — Inpatient Hospital Stay (INDEPENDENT_AMBULATORY_CARE_PROVIDER_SITE_OTHER)
Admission: RE | Admit: 2010-12-16 | Discharge: 2010-12-16 | Disposition: A | Discharge status: Home / Self Care | Payer: Medicaid Other | Source: Ambulatory Visit | Attending: Family Medicine | Admitting: Family Medicine

## 2010-12-16 DIAGNOSIS — J020 Streptococcal pharyngitis: Secondary | ICD-10-CM

## 2010-12-16 DIAGNOSIS — J039 Acute tonsillitis, unspecified: Secondary | ICD-10-CM

## 2010-12-16 LAB — POCT RAPID STREP A: Streptococcus, Group A Screen (Direct): POSITIVE — AB

## 2010-12-23 ENCOUNTER — Ambulatory Visit (HOSPITAL_COMMUNITY)
Admission: RE | Admit: 2010-12-23 | Discharge: 2010-12-23 | Disposition: A | Discharge status: Home / Self Care | Payer: Medicaid Other | Source: Ambulatory Visit | Attending: Gastroenterology | Admitting: Gastroenterology

## 2010-12-23 ENCOUNTER — Other Ambulatory Visit: Payer: Self-pay | Admitting: Gastroenterology

## 2010-12-23 DIAGNOSIS — R197 Diarrhea, unspecified: Secondary | ICD-10-CM | POA: Insufficient documentation

## 2010-12-23 DIAGNOSIS — J4489 Other specified chronic obstructive pulmonary disease: Secondary | ICD-10-CM | POA: Insufficient documentation

## 2010-12-23 DIAGNOSIS — G4733 Obstructive sleep apnea (adult) (pediatric): Secondary | ICD-10-CM | POA: Insufficient documentation

## 2010-12-23 DIAGNOSIS — K648 Other hemorrhoids: Secondary | ICD-10-CM | POA: Insufficient documentation

## 2010-12-23 DIAGNOSIS — E119 Type 2 diabetes mellitus without complications: Secondary | ICD-10-CM | POA: Insufficient documentation

## 2010-12-23 DIAGNOSIS — K219 Gastro-esophageal reflux disease without esophagitis: Secondary | ICD-10-CM | POA: Insufficient documentation

## 2010-12-23 DIAGNOSIS — K259 Gastric ulcer, unspecified as acute or chronic, without hemorrhage or perforation: Secondary | ICD-10-CM | POA: Insufficient documentation

## 2010-12-23 DIAGNOSIS — K2970 Gastritis, unspecified, without bleeding: Secondary | ICD-10-CM | POA: Insufficient documentation

## 2010-12-23 DIAGNOSIS — F319 Bipolar disorder, unspecified: Secondary | ICD-10-CM | POA: Insufficient documentation

## 2010-12-23 DIAGNOSIS — R5381 Other malaise: Secondary | ICD-10-CM | POA: Insufficient documentation

## 2010-12-23 DIAGNOSIS — Z79899 Other long term (current) drug therapy: Secondary | ICD-10-CM | POA: Insufficient documentation

## 2010-12-23 DIAGNOSIS — K5730 Diverticulosis of large intestine without perforation or abscess without bleeding: Secondary | ICD-10-CM | POA: Insufficient documentation

## 2010-12-23 DIAGNOSIS — J449 Chronic obstructive pulmonary disease, unspecified: Secondary | ICD-10-CM | POA: Insufficient documentation

## 2010-12-23 DIAGNOSIS — I10 Essential (primary) hypertension: Secondary | ICD-10-CM | POA: Insufficient documentation

## 2010-12-23 DIAGNOSIS — K2990 Gastroduodenitis, unspecified, without bleeding: Secondary | ICD-10-CM | POA: Insufficient documentation

## 2010-12-23 DIAGNOSIS — R12 Heartburn: Secondary | ICD-10-CM | POA: Insufficient documentation

## 2010-12-23 LAB — BASIC METABOLIC PANEL
GFR calc non Af Amer: 85 mL/min — ABNORMAL LOW (ref >90)
Glucose, Bld: 150 mg/dL — ABNORMAL HIGH (ref 70–99)
Potassium: 3.8 mEq/L (ref 3.5–5.1)
Sodium: 135 mEq/L (ref 135–145)

## 2010-12-23 LAB — GLUCOSE, CAPILLARY: Glucose-Capillary: 131 mg/dL — ABNORMAL HIGH (ref 70–99)

## 2010-12-23 LAB — HEMOGLOBIN AND HEMATOCRIT, BLOOD: HCT: 34.5 % — ABNORMAL LOW (ref 36.0–46.0)

## 2010-12-30 NOTE — Op Note (Signed)
  NAMENEEVA, TREW               ACCOUNT NO.:  [id number]  MEDICAL RECORD NO.:  [id number]  LOCATION:  WLEN                         FACILITY:  [facility]  PHYSICIAN:  Shirley Friar, MDDATE OF BIRTH:  1965/09/22  DATE OF PROCEDURE: DATE OF DISCHARGE:                              OPERATIVE REPORT   PROCEDURE:  Colonoscopy.  INDICATION:  Abdominal pain, diarrhea.  MEDICATIONS:  Per anesthesia: 1. Propofol 300 mg IV. 2. Fentanyl 100 mcg IV. 3. Versed 2 mg IV.  FINDINGS:  Rectal exam was unremarkable.  A pediatric colonoscope was inserted into a fair prepped colon and advanced to the cecum, and ileocecal valve and appendiceal orifice were identified.  The terminal ileum was intubated and was normal in appearance.  On careful withdrawal of the colonoscope, there were scattered large and small diverticula seen in the colon.  There was a very large diverticula seen in the ascending colon.  No other mucosal abnormalities were noted. Retroflexion revealed small internal hemorrhoids.  ASSESSMENT: 1. Scattered diverticulosis. 2. Internal hemorrhoids.  PLAN:  Repeat colonoscopy in 10 years.     Shirley Friar, MD     VCS/MEDQ  D:  12/23/2010  T:  12/23/2010  Job:  [id number]  cc:   Kathreen Cosier, M.D. Fax: [phone]  Electronically Signed by Charlott Rakes MD on 12/30/2010 10:48:18 AM

## 2010-12-30 NOTE — Op Note (Signed)
  NAMEMYRLA, Cheyenne Alvarez               ACCOUNT NO.:  [id number]  MEDICAL RECORD NO.:  [id number]  LOCATION:  WLEN                         FACILITY:  [facility]  PHYSICIAN:  Shirley Friar, MDDATE OF BIRTH:  01-31-1966  DATE OF PROCEDURE:  12/23/2010 DATE OF DISCHARGE:                              OPERATIVE REPORT   PROCEDURE:  Upper endoscopy.  INDICATIONS:  Abdominal pain, heartburn.  MEDICATIONS:  Per Anesthesia with Cetacaine spray x2.  Medicines given for preceding colonoscopy.  FINDINGS:  Endoscope was inserted through oropharynx.  Esophagus was intubated which was normal in its entirety.  Endoscope was advanced down to the stomach which revealed normal proximal mid stomach.  In the distal stomach, there was longitudinal erythematous areas with superficial ulceration.  Retroflexion was done which revealed normal proximal stomach.  Endoscope was straightened, advanced to the duodenal bulb, second portion of the duodenum which were unremarkable.  Endoscope was withdrawn back into the stomach and biopsies were taken of the distal portion of the stomach for histologic purposes.  Endoscope was then withdrawn to confirm above findings.  ASSESSMENT: 1. Mild gastritis, status post biopsies to check for Helicobacter     pylori and inflammation. 2. Otherwise normal upper endoscopy.  PLAN: 1. Followup on path. 2. Avoid NSAIDs. 3. Continue daily proton pump inhibitor therapy.     Shirley Friar, MD     VCS/MEDQ  D:  12/23/2010  T:  12/23/2010  Job:  [id number]  cc:   Kathreen Cosier, M.D. Fax: [phone]  Electronically Signed by Charlott Rakes MD on 12/30/2010 10:48:15 AM

## 2011-03-29 DIAGNOSIS — F411 Generalized anxiety disorder: Secondary | ICD-10-CM | POA: Diagnosis not present

## 2011-03-29 DIAGNOSIS — F259 Schizoaffective disorder, unspecified: Secondary | ICD-10-CM | POA: Diagnosis not present

## 2011-04-19 DIAGNOSIS — R69 Illness, unspecified: Secondary | ICD-10-CM | POA: Diagnosis not present

## 2011-04-19 DIAGNOSIS — IMO0001 Reserved for inherently not codable concepts without codable children: Secondary | ICD-10-CM | POA: Diagnosis not present

## 2011-04-28 DIAGNOSIS — I10 Essential (primary) hypertension: Secondary | ICD-10-CM | POA: Diagnosis not present

## 2011-04-28 DIAGNOSIS — E7800 Pure hypercholesterolemia, unspecified: Secondary | ICD-10-CM | POA: Diagnosis not present

## 2011-04-28 DIAGNOSIS — I2510 Atherosclerotic heart disease of native coronary artery without angina pectoris: Secondary | ICD-10-CM | POA: Diagnosis not present

## 2011-04-28 DIAGNOSIS — E119 Type 2 diabetes mellitus without complications: Secondary | ICD-10-CM | POA: Diagnosis not present

## 2011-05-06 DIAGNOSIS — F259 Schizoaffective disorder, unspecified: Secondary | ICD-10-CM | POA: Diagnosis not present

## 2011-05-06 DIAGNOSIS — F411 Generalized anxiety disorder: Secondary | ICD-10-CM | POA: Diagnosis not present

## 2011-05-11 DIAGNOSIS — E119 Type 2 diabetes mellitus without complications: Secondary | ICD-10-CM | POA: Diagnosis not present

## 2011-07-29 ENCOUNTER — Emergency Department (HOSPITAL_COMMUNITY)
Admission: EM | Admit: 2011-07-29 | Discharge: 2011-07-30 | Disposition: A | Discharge status: Home / Self Care | Payer: Medicare Other | Attending: Emergency Medicine | Admitting: Emergency Medicine

## 2011-07-29 ENCOUNTER — Encounter (HOSPITAL_COMMUNITY): Payer: Self-pay | Admitting: Emergency Medicine

## 2011-07-29 ENCOUNTER — Other Ambulatory Visit: Payer: Self-pay

## 2011-07-29 ENCOUNTER — Emergency Department (HOSPITAL_COMMUNITY): Payer: Medicare Other

## 2011-07-29 DIAGNOSIS — J209 Acute bronchitis, unspecified: Secondary | ICD-10-CM | POA: Diagnosis not present

## 2011-07-29 DIAGNOSIS — J40 Bronchitis, not specified as acute or chronic: Secondary | ICD-10-CM | POA: Diagnosis not present

## 2011-07-29 DIAGNOSIS — I2510 Atherosclerotic heart disease of native coronary artery without angina pectoris: Secondary | ICD-10-CM | POA: Diagnosis not present

## 2011-07-29 DIAGNOSIS — R079 Chest pain, unspecified: Secondary | ICD-10-CM | POA: Diagnosis not present

## 2011-07-29 DIAGNOSIS — R05 Cough: Secondary | ICD-10-CM | POA: Diagnosis not present

## 2011-07-29 DIAGNOSIS — I10 Essential (primary) hypertension: Secondary | ICD-10-CM | POA: Diagnosis not present

## 2011-07-29 DIAGNOSIS — R0789 Other chest pain: Secondary | ICD-10-CM

## 2011-07-29 DIAGNOSIS — R059 Cough, unspecified: Secondary | ICD-10-CM | POA: Insufficient documentation

## 2011-07-29 DIAGNOSIS — R0602 Shortness of breath: Secondary | ICD-10-CM | POA: Diagnosis not present

## 2011-07-29 DIAGNOSIS — E119 Type 2 diabetes mellitus without complications: Secondary | ICD-10-CM | POA: Diagnosis not present

## 2011-07-29 DIAGNOSIS — E7800 Pure hypercholesterolemia, unspecified: Secondary | ICD-10-CM | POA: Diagnosis not present

## 2011-07-29 DIAGNOSIS — R0989 Other specified symptoms and signs involving the circulatory and respiratory systems: Secondary | ICD-10-CM | POA: Diagnosis not present

## 2011-07-29 DIAGNOSIS — R0609 Other forms of dyspnea: Secondary | ICD-10-CM | POA: Diagnosis not present

## 2011-07-29 HISTORY — DX: Essential (primary) hypertension: I10

## 2011-07-29 HISTORY — DX: Gastro-esophageal reflux disease without esophagitis: K21.9

## 2011-07-29 HISTORY — DX: Bipolar disorder, unspecified: F31.9

## 2011-07-29 HISTORY — DX: Anxiety disorder, unspecified: F41.9

## 2011-07-29 LAB — URINALYSIS, ROUTINE W REFLEX MICROSCOPIC
Nitrite: NEGATIVE
Protein, ur: NEGATIVE mg/dL
Specific Gravity, Urine: 1.022 (ref 1.005–1.030)
Urobilinogen, UA: 1 mg/dL (ref 0.0–1.0)

## 2011-07-29 LAB — CBC
Hemoglobin: 11.7 g/dL — ABNORMAL LOW (ref 12.0–15.0)
MCH: 26.4 pg (ref 26.0–34.0)
Platelets: 369 10*3/uL (ref 150–400)
RBC: 4.43 MIL/uL (ref 3.87–5.11)
WBC: 14.4 10*3/uL — ABNORMAL HIGH (ref 4.0–10.5)

## 2011-07-29 LAB — URINE MICROSCOPIC-ADD ON

## 2011-07-29 LAB — POCT I-STAT TROPONIN I: Troponin i, poc: 0.01 ng/mL (ref 0.00–0.08)

## 2011-07-29 LAB — BASIC METABOLIC PANEL
Calcium: 9.2 mg/dL (ref 8.4–10.5)
GFR calc Af Amer: 90 mL/min — ABNORMAL LOW (ref >90)
GFR calc non Af Amer: 78 mL/min — ABNORMAL LOW (ref >90)
Glucose, Bld: 159 mg/dL — ABNORMAL HIGH (ref 70–99)
Potassium: 3.8 mEq/L (ref 3.5–5.1)
Sodium: 137 mEq/L (ref 135–145)

## 2011-07-29 MED ORDER — ALBUTEROL SULFATE (5 MG/ML) 0.5% IN NEBU
5.0000 mg | INHALATION_SOLUTION | Freq: Once | RESPIRATORY_TRACT | Status: AC
  Administered 2011-07-29: 5 mg via RESPIRATORY_TRACT
  Filled 2011-07-29: qty 1

## 2011-07-29 MED ORDER — PREDNISONE 20 MG PO TABS
60.0000 mg | ORAL_TABLET | Freq: Once | ORAL | Status: AC
  Administered 2011-07-29: 60 mg via ORAL
  Filled 2011-07-29: qty 3

## 2011-07-29 MED ORDER — ALBUTEROL SULFATE HFA 108 (90 BASE) MCG/ACT IN AERS: 2.0000 | INHALATION_SPRAY | RESPIRATORY_TRACT | Status: DC | PRN

## 2011-07-29 MED ORDER — IBUPROFEN 200 MG PO TABS
600.0000 mg | ORAL_TABLET | Freq: Once | ORAL | Status: DC
  Filled 2011-07-29: qty 3

## 2011-07-29 MED ORDER — LORAZEPAM 1 MG PO TABS
1.0000 mg | ORAL_TABLET | Freq: Once | ORAL | Status: AC
  Administered 2011-07-29: 1 mg via ORAL
  Filled 2011-07-29: qty 1

## 2011-07-29 MED ORDER — HYDROCODONE-HOMATROPINE 5-1.5 MG/5ML PO SYRP: 5.0000 mL | ORAL_SOLUTION | Freq: Four times a day (QID) | ORAL | Status: AC | PRN

## 2011-07-29 MED ORDER — ONDANSETRON 4 MG PO TBDP
8.0000 mg | ORAL_TABLET | Freq: Once | ORAL | Status: AC
  Administered 2011-07-29: 8 mg via ORAL
  Filled 2011-07-29: qty 2

## 2011-07-29 MED ORDER — ASPIRIN 81 MG PO CHEW
324.0000 mg | CHEWABLE_TABLET | Freq: Once | ORAL | Status: AC
  Administered 2011-07-29: 324 mg via ORAL
  Filled 2011-07-29: qty 4

## 2011-07-29 NOTE — ED Notes (Signed)
Pt continues to complain of cp but states that she is feeling somewhat better.

## 2011-07-29 NOTE — Discharge Instructions (Signed)
Bronchitis Bronchitis is the body's way of reacting to injury and/or infection (inflammation) of the bronchi. Bronchi are the air tubes that extend from the windpipe into the lungs. If the inflammation becomes severe, it may cause shortness of breath. CAUSES  Inflammation may be caused by:  A virus.   Germs (bacteria).   Dust.   Allergens.   Pollutants and many other irritants.  The cells lining the bronchial tree are covered with tiny hairs (cilia). These constantly beat upward, away from the lungs, toward the mouth. This keeps the lungs free of pollutants. When these cells become too irritated and are unable to do their job, mucus begins to develop. This causes the characteristic cough of bronchitis. The cough clears the lungs when the cilia are unable to do their job. Without either of these protective mechanisms, the mucus would settle in the lungs. Then you would develop pneumonia. Smoking is a common cause of bronchitis and can contribute to pneumonia. Stopping this habit is the single most important thing you can do to help yourself. TREATMENT   Medicines that open up your airways make it easier to breathe. Your caregiver may also recommend or prescribe an expectorant. It will loosen the mucus to be coughed up. Only take over-the-counter or prescription medicines for pain, discomfort, or fever as directed by your caregiver.   Removing whatever causes the problem (smoking, for example) is critical to preventing the problem from getting worse.   Cough suppressants may be prescribed for relief of cough symptoms.   Inhaled medicines may be prescribed to help with symptoms now and to help prevent problems from returning.   For those with recurrent (chronic) bronchitis, there may be a need for steroid medicines.  SEEK IMMEDIATE MEDICAL CARE IF:   During treatment, you develop more pus-like mucus (purulent sputum).   You have a fever.   Your baby is older than 3 months with a rectal  temperature of 102 F (38.9 C) or higher.   Your baby is 2 months old or younger with a rectal temperature of 100.4 F (38 C) or higher.   You become progressively more ill.   You have increased difficulty breathing, wheezing, or shortness of breath.  It is necessary to seek immediate medical care if you are elderly or sick from any other disease. MAKE SURE YOU:   Understand these instructions.   Will watch your condition.   Will get help right away if you are not doing well or get worse.  Document Released: 02/14/2005 Document Revised: 02/03/2011 Document Reviewed: 12/25/2007 [facility] Patient Information 2012 [place], [place].

## 2011-07-29 NOTE — ED Notes (Signed)
Pt states she has been having cp, sob, n/v for several days. Pt appears very anxious during interview and states "i have bad anxiety and indigestion."

## 2011-07-29 NOTE — ED Provider Notes (Signed)
History     CSN: [id number]  Arrival date & time 07/29/11  1813   First MD Initiated Contact with Patient 07/29/11 2016      Chief Complaint  Patient presents with  . Chest Pain    (Consider location/radiation/quality/duration/timing/severity/associated sxs/prior treatment) HPI Comments: Patient is a 46 year old with a history of CAD, anxiety, hypertension, diabetes, and bipolar 1 disorder that presents emergency department with multiple chief complaints.  Complaints include nausea, vomiting, diarrhea (x 2 weeks), left arm numbness x3 days, lower back pain, leg burning, tongue numbness, and chest pain/tightness.  When asked patient states her primary concern is the chest tightness it is associated with shortness of breath, dyspnea on exertion, night sweats, occasional chills, and nausea.  Patient states that she was seen by Dr. Sharyn Lull this afternoon and was sent home without any medication and advice to f-u next week for a repeat CXR. Chest tightness has been occuring for about a month but has gradually worsened over the last 2 days, located sub sternally, described as a constant squeezing and does not radiate. Pt denies swelling of legs (on lasix), claudication, cough, hemoptysis, light headedness, loss of bowel or bladder control, difficulty ambulating, urinary frequency, nocturia, or dysuria. No other complaints at this time. Pt states she does not want narcotic pain medication.   The history is provided by the patient.    Past Medical History  Diagnosis Date  . Anxiety   . GERD (gastroesophageal reflux disease)   . Diabetes mellitus   . Hypertension   . Bipolar 1 disorder     Past Surgical History  Procedure Date  . Abdominal hysterectomy   . Leg surgery   . Cholecystectomy   . Tubal ligation     No family history on file.  History  Substance Use Topics  . Smoking status: Never Smoker   . Smokeless tobacco: Not on file  . Alcohol Use: No    OB History    Grav Para  Term Preterm Abortions TAB SAB Ect Mult Living                  Review of Systems  All other systems reviewed and are negative.    Allergies  Famotidine and Meperidine hcl  Home Medications   Current Outpatient Rx  Name Route Sig Dispense Refill  . ATENOLOL 25 MG PO TABS Oral Take 25 mg by mouth 2 (two) times daily.    Marland Kitchen CLONAZEPAM 0.5 MG PO TABS Oral Take 0.5 mg by mouth 3 (three) times daily.    Marland Kitchen FLUOXETINE HCL 20 MG PO CAPS Oral Take 40 mg by mouth daily.    . FUROSEMIDE 20 MG PO TABS Oral Take 20 mg by mouth daily.    Marland Kitchen GABAPENTIN 300 MG PO CAPS Oral Take 300 mg by mouth at bedtime.    Marland Kitchen GLIPIZIDE 5 MG PO TABS Oral Take 5 mg by mouth daily.    Marland Kitchen METFORMIN HCL 500 MG PO TABS Oral Take 500-1,000 mg by mouth 2 (two) times daily with a meal. 1000 mg at lunch and 500 mg at supper    . PANTOPRAZOLE SODIUM 40 MG PO TBEC Oral Take 40 mg by mouth daily.    Marland Kitchen RAMIPRIL 10 MG PO CAPS Oral Take 10 mg by mouth daily.    Marland Kitchen RISPERIDONE 1 MG PO TABS Oral Take 1 mg by mouth 2 (two) times daily.    Marland Kitchen ZOLPIDEM TARTRATE 10 MG PO TABS Oral Take 10 mg by  mouth at bedtime as needed. For sleep      BP 137/94  Pulse 97  Temp(Src) 98.6 F (37 C) (Oral)  Resp 24  SpO2 91%  Physical Exam  Nursing note and vitals reviewed. Constitutional: She appears well-developed and well-nourished. No distress.  HENT:  Head: Normocephalic and atraumatic.  Eyes: Conjunctivae and EOM are normal. Pupils are equal, round, and reactive to light.  Neck: Normal range of motion. Neck supple. Normal carotid pulses and no JVD present. Carotid bruit is not present. No rigidity. Normal range of motion present.  Cardiovascular: Normal rate, regular rhythm, S1 normal, S2 normal, normal heart sounds, intact distal pulses and normal pulses.  Exam reveals no gallop and no friction rub.   No murmur heard.      No pitting edema bilaterally, RRR, no aberrant sounds on auscultations, distal pulses intact, no carotid bruit or JVD.    Pulmonary/Chest: Effort normal and breath sounds normal. No accessory muscle usage or stridor. No respiratory distress. She exhibits no tenderness and no bony tenderness.       LCAB  Abdominal: Bowel sounds are normal.       Obsese, Soft non tender. Non pulsatile aorta.   Skin: Skin is warm, dry and intact. No rash noted. She is not diaphoretic. No cyanosis. Nails show no clubbing.    ED Course  Procedures (including critical care time)  Labs Reviewed  CBC - Abnormal; Notable for the following:    WBC 14.4 (*)    Hemoglobin 11.7 (*)    HCT 35.2 (*)    RDW 15.9 (*)    All other components within normal limits  BASIC METABOLIC PANEL  POCT I-STAT TROPONIN I  URINALYSIS, ROUTINE W REFLEX MICROSCOPIC   Dg Chest 2 View  07/29/2011  *RADIOLOGY REPORT*  Clinical Data: Chest pain, shortness of breath.  CHEST - 2 VIEW  Comparison: 06/14/2010  Findings: Low lung volumes.  Heart size is accentuated by the low volumes, felt to be within normal limits.  Mild peribronchial thickening.  No confluent opacity or effusion.  No acute bony abnormality.  IMPRESSION: Low lung volumes.  Mild bronchitic changes.  Original Report Authenticated By: Cyndie Chime, M.D.     No diagnosis found.    MDM  Chest tightness, mild bronchitis   Patient is to be discharged with recommendation to follow up with PCP in regards to today's hospital visit. Chest pain is not likely of cardiac etiology d/t presentation, perc negative, VSS, no tracheal deviation, no JVD or new murmur, RRR, breath sounds equal bilaterally, EKG without acute abnormalities, negative troponin x 2, and CXR showing mild bronchitis. Pt has been advised to return to the ED is CP becomes exertional, associated with diaphoresis or nausea, radiates to left jaw/arm, worsens or becomes concerning in any way. Pt appears reliable for follow up and is agreeable to discharge.   Case has been discussed with and seen by Dr. Ranae Palms  who agrees with the  above plan to discharge.          Jaci Carrel, [place] 07/29/11 2349

## 2011-07-29 NOTE — ED Notes (Signed)
C/o pain in center of chest, nausea, vomiting, dizziness, sob, L arm numbness x 3 days.  Seen by Cardiologist today and told she would be scheduled for EKG next week.  Pt states tightness in chest got worse when she got home from dr's office.  States she takes medicine for indigestion and anxiety.  States this pain feels different.  Reports pain across lower back when she urinates.  States pain in lower back radiates up spine.

## 2011-07-30 NOTE — ED Provider Notes (Signed)
Medical screening examination/treatment/procedure(s) were conducted as a shared visit with non-physician practitioner(s) and myself.  I personally evaluated the patient during the encounter   Loren Racer, MD 07/30/11 [phone]

## 2011-08-11 DIAGNOSIS — E119 Type 2 diabetes mellitus without complications: Secondary | ICD-10-CM | POA: Diagnosis not present

## 2011-08-11 DIAGNOSIS — F411 Generalized anxiety disorder: Secondary | ICD-10-CM | POA: Diagnosis not present

## 2011-08-11 DIAGNOSIS — F259 Schizoaffective disorder, unspecified: Secondary | ICD-10-CM | POA: Diagnosis not present

## 2011-08-24 ENCOUNTER — Encounter (HOSPITAL_COMMUNITY): Payer: Self-pay | Admitting: Emergency Medicine

## 2011-08-24 ENCOUNTER — Emergency Department (HOSPITAL_COMMUNITY)
Admission: EM | Admit: 2011-08-24 | Discharge: 2011-08-24 | Discharge status: Against Medical Advice | Payer: Medicare Other | Attending: Emergency Medicine | Admitting: Emergency Medicine

## 2011-08-24 ENCOUNTER — Emergency Department (HOSPITAL_COMMUNITY): Payer: Medicare Other

## 2011-08-24 DIAGNOSIS — R0602 Shortness of breath: Secondary | ICD-10-CM | POA: Diagnosis not present

## 2011-08-24 DIAGNOSIS — R079 Chest pain, unspecified: Secondary | ICD-10-CM | POA: Diagnosis not present

## 2011-08-24 DIAGNOSIS — J9819 Other pulmonary collapse: Secondary | ICD-10-CM | POA: Diagnosis not present

## 2011-08-24 LAB — POCT I-STAT TROPONIN I

## 2011-08-24 LAB — BASIC METABOLIC PANEL
CO2: 24 mEq/L (ref 19–32)
Chloride: 97 mEq/L (ref 96–112)
Creatinine, Ser: 0.95 mg/dL (ref 0.50–1.10)
Glucose, Bld: 216 mg/dL — ABNORMAL HIGH (ref 70–99)

## 2011-08-24 LAB — CBC
HCT: 33.5 % — ABNORMAL LOW (ref 36.0–46.0)
MCV: 77 fL — ABNORMAL LOW (ref 78.0–100.0)
RBC: 4.35 MIL/uL (ref 3.87–5.11)
RDW: 15.2 % (ref 11.5–15.5)
WBC: 12.8 10*3/uL — ABNORMAL HIGH (ref 4.0–10.5)

## 2011-08-24 NOTE — ED Notes (Signed)
C/o sob and pain in center of chest that radiates around L side and into back x 4 weeks.  Pt states she was seen in ED for same 2 weeks ago.  Reports taking anxiety medication today without relief of symptoms.  Vomited x 2 today.  Denies nausea. Also reports hyperglycemia.

## 2011-08-24 NOTE — ED Notes (Signed)
Pt called x1. Unable to locate. 

## 2011-08-24 NOTE — ED Notes (Signed)
Checked patient Cheyenne Alvarez it was 199 notified RN Clydie Braun of blood sugar

## 2011-08-24 NOTE — ED Notes (Signed)
Pt's family member to nurse first desk stating that patients chest pressure is getting worse, triage nurse notified.

## 2011-09-06 DIAGNOSIS — R5381 Other malaise: Secondary | ICD-10-CM | POA: Diagnosis not present

## 2011-09-06 DIAGNOSIS — IMO0001 Reserved for inherently not codable concepts without codable children: Secondary | ICD-10-CM | POA: Diagnosis not present

## 2011-09-06 DIAGNOSIS — R1013 Epigastric pain: Secondary | ICD-10-CM | POA: Diagnosis not present

## 2011-09-06 DIAGNOSIS — I2510 Atherosclerotic heart disease of native coronary artery without angina pectoris: Secondary | ICD-10-CM | POA: Diagnosis not present

## 2011-09-06 DIAGNOSIS — R5383 Other fatigue: Secondary | ICD-10-CM | POA: Diagnosis not present

## 2011-09-20 DIAGNOSIS — F259 Schizoaffective disorder, unspecified: Secondary | ICD-10-CM | POA: Diagnosis not present

## 2011-09-20 DIAGNOSIS — F411 Generalized anxiety disorder: Secondary | ICD-10-CM | POA: Diagnosis not present

## 2011-10-04 DIAGNOSIS — E119 Type 2 diabetes mellitus without complications: Secondary | ICD-10-CM | POA: Diagnosis not present

## 2011-10-04 DIAGNOSIS — I2510 Atherosclerotic heart disease of native coronary artery without angina pectoris: Secondary | ICD-10-CM | POA: Diagnosis not present

## 2011-10-04 DIAGNOSIS — M545 Low back pain, unspecified: Secondary | ICD-10-CM | POA: Diagnosis not present

## 2011-10-04 DIAGNOSIS — J209 Acute bronchitis, unspecified: Secondary | ICD-10-CM | POA: Diagnosis not present

## 2011-10-12 DIAGNOSIS — E119 Type 2 diabetes mellitus without complications: Secondary | ICD-10-CM | POA: Diagnosis not present

## 2011-10-12 DIAGNOSIS — I10 Essential (primary) hypertension: Secondary | ICD-10-CM | POA: Diagnosis not present

## 2011-10-12 DIAGNOSIS — R0989 Other specified symptoms and signs involving the circulatory and respiratory systems: Secondary | ICD-10-CM | POA: Diagnosis not present

## 2011-10-12 DIAGNOSIS — E7800 Pure hypercholesterolemia, unspecified: Secondary | ICD-10-CM | POA: Diagnosis not present

## 2011-10-12 DIAGNOSIS — I2510 Atherosclerotic heart disease of native coronary artery without angina pectoris: Secondary | ICD-10-CM | POA: Diagnosis not present

## 2011-10-12 DIAGNOSIS — R0609 Other forms of dyspnea: Secondary | ICD-10-CM | POA: Diagnosis not present

## 2011-11-01 DIAGNOSIS — M545 Low back pain, unspecified: Secondary | ICD-10-CM | POA: Diagnosis not present

## 2011-11-01 DIAGNOSIS — G609 Hereditary and idiopathic neuropathy, unspecified: Secondary | ICD-10-CM | POA: Diagnosis not present

## 2011-11-03 DIAGNOSIS — F259 Schizoaffective disorder, unspecified: Secondary | ICD-10-CM | POA: Diagnosis not present

## 2011-11-03 DIAGNOSIS — F411 Generalized anxiety disorder: Secondary | ICD-10-CM | POA: Diagnosis not present

## 2011-11-04 DIAGNOSIS — M545 Low back pain, unspecified: Secondary | ICD-10-CM | POA: Diagnosis not present

## 2011-11-04 DIAGNOSIS — G609 Hereditary and idiopathic neuropathy, unspecified: Secondary | ICD-10-CM | POA: Diagnosis not present

## 2011-11-04 DIAGNOSIS — E669 Obesity, unspecified: Secondary | ICD-10-CM | POA: Diagnosis not present

## 2011-12-13 DIAGNOSIS — E119 Type 2 diabetes mellitus without complications: Secondary | ICD-10-CM | POA: Diagnosis not present

## 2011-12-13 DIAGNOSIS — M545 Low back pain, unspecified: Secondary | ICD-10-CM | POA: Diagnosis not present

## 2011-12-13 DIAGNOSIS — E669 Obesity, unspecified: Secondary | ICD-10-CM | POA: Diagnosis not present

## 2011-12-13 DIAGNOSIS — G609 Hereditary and idiopathic neuropathy, unspecified: Secondary | ICD-10-CM | POA: Diagnosis not present

## 2011-12-21 ENCOUNTER — Other Ambulatory Visit (HOSPITAL_COMMUNITY): Payer: Self-pay | Admitting: Internal Medicine

## 2011-12-21 DIAGNOSIS — Z1231 Encounter for screening mammogram for malignant neoplasm of breast: Secondary | ICD-10-CM

## 2012-01-03 DIAGNOSIS — F411 Generalized anxiety disorder: Secondary | ICD-10-CM | POA: Diagnosis not present

## 2012-01-03 DIAGNOSIS — F259 Schizoaffective disorder, unspecified: Secondary | ICD-10-CM | POA: Diagnosis not present

## 2012-01-09 ENCOUNTER — Ambulatory Visit (HOSPITAL_COMMUNITY)
Admission: RE | Admit: 2012-01-09 | Discharge: 2012-01-09 | Disposition: A | Discharge status: Home / Self Care | Payer: Medicare Other | Source: Ambulatory Visit | Attending: Internal Medicine | Admitting: Internal Medicine

## 2012-01-09 DIAGNOSIS — Z1231 Encounter for screening mammogram for malignant neoplasm of breast: Secondary | ICD-10-CM | POA: Diagnosis not present

## 2012-01-12 DIAGNOSIS — E039 Hypothyroidism, unspecified: Secondary | ICD-10-CM | POA: Diagnosis not present

## 2012-01-12 DIAGNOSIS — G589 Mononeuropathy, unspecified: Secondary | ICD-10-CM | POA: Diagnosis not present

## 2012-01-12 DIAGNOSIS — R5381 Other malaise: Secondary | ICD-10-CM | POA: Diagnosis not present

## 2012-01-12 DIAGNOSIS — E119 Type 2 diabetes mellitus without complications: Secondary | ICD-10-CM | POA: Diagnosis not present

## 2012-01-12 DIAGNOSIS — M545 Low back pain, unspecified: Secondary | ICD-10-CM | POA: Diagnosis not present

## 2012-01-12 DIAGNOSIS — R5383 Other fatigue: Secondary | ICD-10-CM | POA: Diagnosis not present

## 2012-01-12 DIAGNOSIS — E782 Mixed hyperlipidemia: Secondary | ICD-10-CM | POA: Diagnosis not present

## 2012-01-20 DIAGNOSIS — E119 Type 2 diabetes mellitus without complications: Secondary | ICD-10-CM | POA: Diagnosis not present

## 2012-01-20 DIAGNOSIS — M545 Low back pain, unspecified: Secondary | ICD-10-CM | POA: Diagnosis not present

## 2012-01-20 DIAGNOSIS — E669 Obesity, unspecified: Secondary | ICD-10-CM | POA: Diagnosis not present

## 2012-01-20 DIAGNOSIS — J45909 Unspecified asthma, uncomplicated: Secondary | ICD-10-CM | POA: Diagnosis not present

## 2012-02-09 DIAGNOSIS — E119 Type 2 diabetes mellitus without complications: Secondary | ICD-10-CM | POA: Diagnosis not present

## 2012-02-09 DIAGNOSIS — J45909 Unspecified asthma, uncomplicated: Secondary | ICD-10-CM | POA: Diagnosis not present

## 2012-02-09 DIAGNOSIS — I10 Essential (primary) hypertension: Secondary | ICD-10-CM | POA: Diagnosis not present

## 2012-02-09 DIAGNOSIS — G609 Hereditary and idiopathic neuropathy, unspecified: Secondary | ICD-10-CM | POA: Diagnosis not present

## 2012-03-09 DIAGNOSIS — M545 Low back pain, unspecified: Secondary | ICD-10-CM | POA: Diagnosis not present

## 2012-03-09 DIAGNOSIS — I10 Essential (primary) hypertension: Secondary | ICD-10-CM | POA: Diagnosis not present

## 2012-03-09 DIAGNOSIS — E119 Type 2 diabetes mellitus without complications: Secondary | ICD-10-CM | POA: Diagnosis not present

## 2012-03-09 DIAGNOSIS — E669 Obesity, unspecified: Secondary | ICD-10-CM | POA: Diagnosis not present

## 2012-03-09 DIAGNOSIS — J45909 Unspecified asthma, uncomplicated: Secondary | ICD-10-CM | POA: Diagnosis not present

## 2012-03-09 DIAGNOSIS — G609 Hereditary and idiopathic neuropathy, unspecified: Secondary | ICD-10-CM | POA: Diagnosis not present

## 2012-04-03 DIAGNOSIS — F411 Generalized anxiety disorder: Secondary | ICD-10-CM | POA: Diagnosis not present

## 2012-04-03 DIAGNOSIS — F259 Schizoaffective disorder, unspecified: Secondary | ICD-10-CM | POA: Diagnosis not present

## 2012-04-06 DIAGNOSIS — I10 Essential (primary) hypertension: Secondary | ICD-10-CM | POA: Diagnosis not present

## 2012-04-06 DIAGNOSIS — M545 Low back pain, unspecified: Secondary | ICD-10-CM | POA: Diagnosis not present

## 2012-04-06 DIAGNOSIS — E119 Type 2 diabetes mellitus without complications: Secondary | ICD-10-CM | POA: Diagnosis not present

## 2012-04-06 DIAGNOSIS — J45909 Unspecified asthma, uncomplicated: Secondary | ICD-10-CM | POA: Diagnosis not present

## 2012-05-09 DIAGNOSIS — I10 Essential (primary) hypertension: Secondary | ICD-10-CM | POA: Diagnosis not present

## 2012-05-09 DIAGNOSIS — E669 Obesity, unspecified: Secondary | ICD-10-CM | POA: Diagnosis not present

## 2012-05-09 DIAGNOSIS — R609 Edema, unspecified: Secondary | ICD-10-CM | POA: Diagnosis not present

## 2012-05-09 DIAGNOSIS — J45909 Unspecified asthma, uncomplicated: Secondary | ICD-10-CM | POA: Diagnosis not present

## 2012-06-06 DIAGNOSIS — E7800 Pure hypercholesterolemia, unspecified: Secondary | ICD-10-CM | POA: Diagnosis not present

## 2012-06-06 DIAGNOSIS — E119 Type 2 diabetes mellitus without complications: Secondary | ICD-10-CM | POA: Diagnosis not present

## 2012-06-06 DIAGNOSIS — I10 Essential (primary) hypertension: Secondary | ICD-10-CM | POA: Diagnosis not present

## 2012-06-26 DIAGNOSIS — F259 Schizoaffective disorder, unspecified: Secondary | ICD-10-CM | POA: Diagnosis not present

## 2012-06-26 DIAGNOSIS — F411 Generalized anxiety disorder: Secondary | ICD-10-CM | POA: Diagnosis not present

## 2012-07-10 DIAGNOSIS — F259 Schizoaffective disorder, unspecified: Secondary | ICD-10-CM | POA: Diagnosis not present

## 2012-07-10 DIAGNOSIS — F411 Generalized anxiety disorder: Secondary | ICD-10-CM | POA: Diagnosis not present

## 2012-08-14 ENCOUNTER — Inpatient Hospital Stay (HOSPITAL_COMMUNITY): Payer: Medicare Other

## 2012-08-14 ENCOUNTER — Inpatient Hospital Stay (HOSPITAL_COMMUNITY)
Admission: EM | Admit: 2012-08-14 | Discharge: 2012-08-17 | DRG: 915 | Disposition: A | Discharge status: Home / Self Care | Payer: Medicare Other | Attending: Internal Medicine | Admitting: Internal Medicine

## 2012-08-14 ENCOUNTER — Encounter (HOSPITAL_COMMUNITY): Payer: Self-pay | Admitting: *Deleted

## 2012-08-14 ENCOUNTER — Emergency Department (INDEPENDENT_AMBULATORY_CARE_PROVIDER_SITE_OTHER)
Admission: EM | Admit: 2012-08-14 | Discharge: 2012-08-14 | Disposition: A | Discharge status: Other Acute Inpatient Hospital | Payer: Medicare Other | Source: Home / Self Care | Attending: Emergency Medicine | Admitting: Emergency Medicine

## 2012-08-14 ENCOUNTER — Inpatient Hospital Stay (HOSPITAL_COMMUNITY): Payer: Medicare Other | Admitting: Anesthesiology

## 2012-08-14 ENCOUNTER — Encounter (HOSPITAL_COMMUNITY): Payer: Self-pay | Admitting: Anesthesiology

## 2012-08-14 DIAGNOSIS — Z23 Encounter for immunization: Secondary | ICD-10-CM | POA: Diagnosis not present

## 2012-08-14 DIAGNOSIS — Z7982 Long term (current) use of aspirin: Secondary | ICD-10-CM

## 2012-08-14 DIAGNOSIS — D649 Anemia, unspecified: Secondary | ICD-10-CM

## 2012-08-14 DIAGNOSIS — N179 Acute kidney failure, unspecified: Secondary | ICD-10-CM | POA: Diagnosis not present

## 2012-08-14 DIAGNOSIS — J9601 Acute respiratory failure with hypoxia: Secondary | ICD-10-CM | POA: Diagnosis present

## 2012-08-14 DIAGNOSIS — T465X5A Adverse effect of other antihypertensive drugs, initial encounter: Secondary | ICD-10-CM

## 2012-08-14 DIAGNOSIS — Z79899 Other long term (current) drug therapy: Secondary | ICD-10-CM

## 2012-08-14 DIAGNOSIS — D72829 Elevated white blood cell count, unspecified: Secondary | ICD-10-CM | POA: Diagnosis present

## 2012-08-14 DIAGNOSIS — E119 Type 2 diabetes mellitus without complications: Secondary | ICD-10-CM | POA: Diagnosis not present

## 2012-08-14 DIAGNOSIS — J984 Other disorders of lung: Secondary | ICD-10-CM | POA: Diagnosis not present

## 2012-08-14 DIAGNOSIS — T783XXA Angioneurotic edema, initial encounter: Principal | ICD-10-CM

## 2012-08-14 DIAGNOSIS — J9600 Acute respiratory failure, unspecified whether with hypoxia or hypercapnia: Secondary | ICD-10-CM

## 2012-08-14 DIAGNOSIS — E8941 Symptomatic postprocedural ovarian failure: Secondary | ICD-10-CM

## 2012-08-14 DIAGNOSIS — K219 Gastro-esophageal reflux disease without esophagitis: Secondary | ICD-10-CM

## 2012-08-14 DIAGNOSIS — R3129 Other microscopic hematuria: Secondary | ICD-10-CM

## 2012-08-14 DIAGNOSIS — Z5189 Encounter for other specified aftercare: Secondary | ICD-10-CM | POA: Diagnosis not present

## 2012-08-14 DIAGNOSIS — E872 Acidosis, unspecified: Secondary | ICD-10-CM | POA: Diagnosis not present

## 2012-08-14 DIAGNOSIS — E875 Hyperkalemia: Secondary | ICD-10-CM | POA: Diagnosis present

## 2012-08-14 DIAGNOSIS — F319 Bipolar disorder, unspecified: Secondary | ICD-10-CM | POA: Diagnosis present

## 2012-08-14 DIAGNOSIS — I10 Essential (primary) hypertension: Secondary | ICD-10-CM | POA: Diagnosis not present

## 2012-08-14 DIAGNOSIS — F411 Generalized anxiety disorder: Secondary | ICD-10-CM | POA: Diagnosis present

## 2012-08-14 DIAGNOSIS — R252 Cramp and spasm: Secondary | ICD-10-CM

## 2012-08-14 DIAGNOSIS — F4310 Post-traumatic stress disorder, unspecified: Secondary | ICD-10-CM

## 2012-08-14 DIAGNOSIS — T464X5A Adverse effect of angiotensin-converting-enzyme inhibitors, initial encounter: Secondary | ICD-10-CM

## 2012-08-14 DIAGNOSIS — F17200 Nicotine dependence, unspecified, uncomplicated: Secondary | ICD-10-CM

## 2012-08-14 DIAGNOSIS — E669 Obesity, unspecified: Secondary | ICD-10-CM

## 2012-08-14 DIAGNOSIS — T46905A Adverse effect of unspecified agents primarily affecting the cardiovascular system, initial encounter: Secondary | ICD-10-CM | POA: Diagnosis present

## 2012-08-14 DIAGNOSIS — R0989 Other specified symptoms and signs involving the circulatory and respiratory systems: Secondary | ICD-10-CM | POA: Diagnosis not present

## 2012-08-14 DIAGNOSIS — R109 Unspecified abdominal pain: Secondary | ICD-10-CM | POA: Diagnosis not present

## 2012-08-14 DIAGNOSIS — R220 Localized swelling, mass and lump, head: Secondary | ICD-10-CM | POA: Diagnosis not present

## 2012-08-14 DIAGNOSIS — J189 Pneumonia, unspecified organism: Secondary | ICD-10-CM

## 2012-08-14 DIAGNOSIS — R0902 Hypoxemia: Secondary | ICD-10-CM | POA: Diagnosis not present

## 2012-08-14 DIAGNOSIS — Z794 Long term (current) use of insulin: Secondary | ICD-10-CM | POA: Diagnosis not present

## 2012-08-14 DIAGNOSIS — T464X5D Adverse effect of angiotensin-converting-enzyme inhibitors, subsequent encounter: Secondary | ICD-10-CM

## 2012-08-14 DIAGNOSIS — G4700 Insomnia, unspecified: Secondary | ICD-10-CM | POA: Diagnosis present

## 2012-08-14 DIAGNOSIS — Z9089 Acquired absence of other organs: Secondary | ICD-10-CM

## 2012-08-14 DIAGNOSIS — B370 Candidal stomatitis: Secondary | ICD-10-CM

## 2012-08-14 DIAGNOSIS — Z4682 Encounter for fitting and adjustment of non-vascular catheter: Secondary | ICD-10-CM | POA: Diagnosis not present

## 2012-08-14 LAB — BASIC METABOLIC PANEL
BUN: 11 mg/dL (ref 6–23)
CO2: 26 mEq/L (ref 19–32)
Calcium: 9.6 mg/dL (ref 8.4–10.5)
Chloride: 97 mEq/L (ref 96–112)
Creatinine, Ser: 0.97 mg/dL (ref 0.50–1.10)
Creatinine, Ser: 0.94 mg/dL (ref 0.50–1.10)
GFR calc Af Amer: 79 mL/min — ABNORMAL LOW (ref >90)
GFR calc Af Amer: 82 mL/min — ABNORMAL LOW (ref >90)
GFR calc non Af Amer: 68 mL/min — ABNORMAL LOW (ref >90)
Glucose, Bld: 274 mg/dL — ABNORMAL HIGH (ref 70–99)
Potassium: 3.8 mEq/L (ref 3.5–5.1)
Potassium: 3.7 mEq/L (ref 3.5–5.1)
Sodium: 136 mEq/L (ref 135–145)
Sodium: 135 mEq/L (ref 135–145)

## 2012-08-14 LAB — CBC WITH DIFFERENTIAL/PLATELET
Basophils Absolute: 0 10*3/uL (ref 0.0–0.1)
Basophils Relative: 0 % (ref 0–1)
Eosinophils Absolute: 0.2 10*3/uL (ref 0.0–0.7)
Hemoglobin: 12.3 g/dL (ref 12.0–15.0)
MCH: 25.8 pg — ABNORMAL LOW (ref 26.0–34.0)
MCHC: 33.9 g/dL (ref 30.0–36.0)
Monocytes Relative: 6 % (ref 3–12)
Neutro Abs: 7.1 10*3/uL (ref 1.7–7.7)
Neutrophils Relative %: 51 % (ref 43–77)
Platelets: 486 10*3/uL — ABNORMAL HIGH (ref 150–400)
RDW: 14.8 % (ref 11.5–15.5)

## 2012-08-14 LAB — POCT I-STAT 3, ART BLOOD GAS (G3+)
Bicarbonate: 24.6 mEq/L — ABNORMAL HIGH (ref 20.0–24.0)
O2 Saturation: 96 %
TCO2: 26 mmol/L (ref 0–100)
TCO2: 27 mmol/L (ref 0–100)
pCO2 arterial: 57.9 mmHg (ref 35.0–45.0)
pCO2 arterial: 45.4 mmHg — ABNORMAL HIGH (ref 35.0–45.0)
pH, Arterial: 7.236 — ABNORMAL LOW (ref 7.350–7.450)
pH, Arterial: 7.361 (ref 7.350–7.450)
pO2, Arterial: 100 mmHg (ref 80.0–100.0)
pO2, Arterial: 117 mmHg — ABNORMAL HIGH (ref 80.0–100.0)

## 2012-08-14 LAB — CBC
MCV: 75.6 fL — ABNORMAL LOW (ref 78.0–100.0)
Platelets: 488 10*3/uL — ABNORMAL HIGH (ref 150–400)
RBC: 4.8 MIL/uL (ref 3.87–5.11)
RDW: 14.7 % (ref 11.5–15.5)
WBC: 20.4 10*3/uL — ABNORMAL HIGH (ref 4.0–10.5)

## 2012-08-14 LAB — GLUCOSE, CAPILLARY: Glucose-Capillary: 296 mg/dL — ABNORMAL HIGH (ref 70–99)

## 2012-08-14 MED ORDER — SODIUM CHLORIDE 0.9 % IV SOLN: 250.0000 mL | INTRAVENOUS | Status: DC | PRN

## 2012-08-14 MED ORDER — DIPHENHYDRAMINE HCL 50 MG/ML IJ SOLN
INTRAMUSCULAR | Status: AC
  Filled 2012-08-14: qty 1

## 2012-08-14 MED ORDER — ETOMIDATE 2 MG/ML IV SOLN
INTRAVENOUS | Status: DC | PRN
  Administered 2012-08-14: 20 mg via INTRAVENOUS

## 2012-08-14 MED ORDER — FENTANYL CITRATE 0.05 MG/ML IJ SOLN
50.0000 ug | Freq: Once | INTRAMUSCULAR | Status: AC
  Administered 2012-08-14: 50 ug via INTRAVENOUS

## 2012-08-14 MED ORDER — SODIUM CHLORIDE 0.9 % IV SOLN
INTRAVENOUS | Status: DC
  Administered 2012-08-14: 20:00:00 via INTRAVENOUS

## 2012-08-14 MED ORDER — FENTANYL CITRATE 0.05 MG/ML IJ SOLN
25.0000 ug | INTRAMUSCULAR | Status: DC | PRN
  Administered 2012-08-14: 50 ug via INTRAVENOUS
  Administered 2012-08-15: 25 ug via INTRAVENOUS
  Administered 2012-08-16 – 2012-08-17 (×2): 50 ug via INTRAVENOUS
  Filled 2012-08-14 (×4): qty 2

## 2012-08-14 MED ORDER — ENOXAPARIN SODIUM 40 MG/0.4ML ~~LOC~~ SOLN
40.0000 mg | SUBCUTANEOUS | Status: DC
  Administered 2012-08-14 – 2012-08-16 (×3): 40 mg via SUBCUTANEOUS
  Filled 2012-08-14 (×5): qty 0.4

## 2012-08-14 MED ORDER — PROPOFOL 10 MG/ML IV EMUL
5.0000 ug/kg/min | INTRAVENOUS | Status: DC
  Administered 2012-08-14: 50 ug/kg/min via INTRAVENOUS
  Filled 2012-08-14 (×3): qty 100

## 2012-08-14 MED ORDER — SUCCINYLCHOLINE CHLORIDE 20 MG/ML IJ SOLN
INTRAMUSCULAR | Status: DC | PRN
  Administered 2012-08-14: 140 mg via INTRAVENOUS

## 2012-08-14 MED ORDER — EPINEPHRINE HCL 1 MG/ML IJ SOLN
0.3000 mg | Freq: Once | INTRAMUSCULAR | Status: AC
  Administered 2012-08-14: 0.3 mg via INTRAMUSCULAR

## 2012-08-14 MED ORDER — SODIUM CHLORIDE 0.9 % IV SOLN
25.0000 ug/h | INTRAVENOUS | Status: DC
  Administered 2012-08-14: 100 ug/h via INTRAVENOUS
  Filled 2012-08-14: qty 50

## 2012-08-14 MED ORDER — METHYLPREDNISOLONE SODIUM SUCC 125 MG IJ SOLR
INTRAMUSCULAR | Status: AC
  Filled 2012-08-14: qty 2

## 2012-08-14 MED ORDER — INSULIN ASPART 100 UNIT/ML ~~LOC~~ SOLN
0.0000 [IU] | SUBCUTANEOUS | Status: DC
  Administered 2012-08-14 – 2012-08-15 (×2): 8 [IU] via SUBCUTANEOUS
  Administered 2012-08-15: 3 [IU] via SUBCUTANEOUS
  Administered 2012-08-15: 5 [IU] via SUBCUTANEOUS
  Administered 2012-08-15: 8 [IU] via SUBCUTANEOUS
  Administered 2012-08-15 (×2): 3 [IU] via SUBCUTANEOUS
  Administered 2012-08-16: 8 [IU] via SUBCUTANEOUS
  Administered 2012-08-16: 5 [IU] via SUBCUTANEOUS

## 2012-08-14 MED ORDER — OLANZAPINE 10 MG PO TABS
20.0000 mg | ORAL_TABLET | Freq: Every day | ORAL | Status: DC
  Administered 2012-08-15 – 2012-08-16 (×2): 20 mg via ORAL
  Filled 2012-08-14 (×4): qty 2

## 2012-08-14 MED ORDER — LORAZEPAM 2 MG/ML IJ SOLN
1.0000 mg | Freq: Once | INTRAMUSCULAR | Status: AC
  Administered 2012-08-14: 1 mg via INTRAVENOUS
  Filled 2012-08-14: qty 1

## 2012-08-14 MED ORDER — PROPOFOL 10 MG/ML IV EMUL
INTRAVENOUS | Status: AC
  Filled 2012-08-14: qty 100

## 2012-08-14 MED ORDER — FENTANYL CITRATE 0.05 MG/ML IJ SOLN
INTRAMUSCULAR | Status: AC
  Filled 2012-08-14: qty 2

## 2012-08-14 MED ORDER — PROPOFOL 10 MG/ML IV BOLUS
10.0000 mg | Freq: Once | INTRAVENOUS | Status: AC
  Administered 2012-08-14: 10 mg via INTRAVENOUS

## 2012-08-14 MED ORDER — PROPOFOL 10 MG/ML IV EMUL
5.0000 ug/kg/min | Freq: Once | INTRAVENOUS | Status: AC
  Administered 2012-08-14: 25 ug/kg/min via INTRAVENOUS

## 2012-08-14 MED ORDER — DIPHENHYDRAMINE HCL 50 MG/ML IJ SOLN
25.0000 mg | Freq: Once | INTRAMUSCULAR | Status: AC
  Administered 2012-08-14: 25 mg via INTRAVENOUS

## 2012-08-14 MED ORDER — INSULIN GLARGINE 100 UNIT/ML ~~LOC~~ SOLN
15.0000 [IU] | Freq: Every day | SUBCUTANEOUS | Status: DC
  Administered 2012-08-14 – 2012-08-15 (×2): 15 [IU] via SUBCUTANEOUS
  Filled 2012-08-14 (×2): qty 0.15

## 2012-08-14 MED ORDER — ATENOLOL 25 MG PO TABS
25.0000 mg | ORAL_TABLET | Freq: Two times a day (BID) | ORAL | Status: DC
  Administered 2012-08-15 – 2012-08-17 (×5): 25 mg via ORAL
  Filled 2012-08-14 (×8): qty 1

## 2012-08-14 MED ORDER — EPINEPHRINE HCL 1 MG/ML IJ SOLN
INTRAMUSCULAR | Status: AC
  Filled 2012-08-14: qty 1

## 2012-08-14 MED ORDER — SODIUM CHLORIDE 0.9 % IV SOLN
INTRAVENOUS | Status: DC
  Administered 2012-08-14: 18:00:00 via INTRAVENOUS

## 2012-08-14 MED ORDER — METHYLPREDNISOLONE SODIUM SUCC 125 MG IJ SOLR
125.0000 mg | Freq: Once | INTRAMUSCULAR | Status: AC
  Administered 2012-08-14: 125 mg via INTRAVENOUS

## 2012-08-14 MED ORDER — PROPOFOL 10 MG/ML IV BOLUS
INTRAVENOUS | Status: DC | PRN
  Administered 2012-08-14: 50 mg via INTRAVENOUS

## 2012-08-14 MED ORDER — GABAPENTIN 300 MG PO CAPS
300.0000 mg | ORAL_CAPSULE | Freq: Three times a day (TID) | ORAL | Status: DC
  Administered 2012-08-15 – 2012-08-17 (×7): 300 mg via ORAL
  Filled 2012-08-14 (×10): qty 1

## 2012-08-14 MED ORDER — PANTOPRAZOLE SODIUM 40 MG IV SOLR
40.0000 mg | Freq: Every day | INTRAVENOUS | Status: DC
  Administered 2012-08-14: 40 mg via INTRAVENOUS
  Filled 2012-08-14 (×2): qty 40

## 2012-08-14 NOTE — Anesthesia Procedure Notes (Signed)
Procedure Name: Intubation Date/Time: 08/14/2012 6:13 PM Performed by: Leona Singleton A Pre-anesthesia Checklist: Patient identified Patient Re-evaluated:Patient Re-evaluated prior to inductionOxygen Delivery Method: Circle system utilized Intubation Type: IV induction, Rapid sequence and Cricoid Pressure applied Grade View: Grade II Tube type: Parker flex tip Tube size: 7.0 mm Number of attempts: 1 Airway Equipment and Method: Video-laryngoscopy Placement Confirmation: ETT inserted through vocal cords under direct vision,  positive ETCO2,  CO2 detector and breath sounds checked- equal and bilateral Secured at: 23 cm Tube secured with: Tape Dental Injury: Teeth and Oropharynx as per pre-operative assessment  Difficulty Due To: Difficulty was anticipated, Difficult Airway-  due to edematous airway and Difficult Airway- due to anterior larynx Future Recommendations: Recommend- induction with short-acting agent, and alternative techniques readily available

## 2012-08-14 NOTE — ED Notes (Signed)
Dr  Lorenz Coaster    States  Pt  May  Be  Having a  Delayed  Reaction  To  Ramipril

## 2012-08-14 NOTE — ED Notes (Signed)
PT recently started ramipril and started having tongue swelling this am and now with increased swelling.  Seen at [facility] and treated with IM Epinepherine, IV solumedrol and IV benadryl.  PT is talking in complete sentences and sats 100/2L

## 2012-08-14 NOTE — ED Notes (Signed)
Anesthesia MD at bedside talking with pt.  Pt reports edema not improving after receiving meds at [facility]

## 2012-08-14 NOTE — ED Notes (Signed)
Iv  Ns  tko      20  Angio   l  Hand  1  Att  Site  patent 

## 2012-08-14 NOTE — ED Provider Notes (Signed)
Pt seen on arrival for concern for angioedema I called the anesthesiologist on call and he will see patient for likely intubation She has already been given epipnephrine and solumedrol at urgent care  Will follow closely   Joya Gaskins, MD 08/14/12 1806

## 2012-08-14 NOTE — Transfer of Care (Signed)
Immediate Anesthesia Transfer of Care Note  Patient: Cheyenne Alvarez  Procedure(s) Performed: * No procedures listed *  Patient Location: PACU and Nursing Unit  Anesthesia Type:General  Level of Consciousness: sedated and Patient remains intubated per anesthesia plan  Airway & Oxygen Therapy: Patient remains intubated per anesthesia plan and Patient placed on Ventilator (see vital sign flow sheet for setting)  Post-op Assessment: Report given to PACU RN  Post vital signs: Reviewed and stable  Complications: No apparent anesthesia complications

## 2012-08-14 NOTE — Progress Notes (Signed)
Patient transported from ED to Medical ICU on ventilator.  No complications.  Ventilator was hooked to oxygen and air and plugged into the red outlet.  Report was given and Unit RT at bedside.

## 2012-08-14 NOTE — ED Provider Notes (Addendum)
Chief Complaint:   Chief Complaint  Patient presents with  . Oral Swelling    History of Present Illness:   Cheyenne Alvarez is a 47 year old female with multiple medical problems including diabetes, hypertension, and COPD the patient was in her usual state of health until about 1 PM today when she noted fairly sudden onset of swelling of the left side of the tongue. This was not painful. She has difficulty talking and chewing but no difficulty breathing. She denies shortness of breath, wheezing, or coughing. She's had no fever, chills, nasal congestion, sore throat, swollen glands, swollen neck, or chest pain. She has not taken any new medications and has had no suspicious ingestions or stings or bites. She is on ramipril which he has been taking for years.  Review of Systems:  Other than noted above, the patient denies any of the following symptoms: Systemic:  No fevers, chills, sweats, weight loss or gain, fatigue, or tiredness. Eye:  No redness, pain, discharge, itching, blurred vision, or diplopia. ENT:  No headache, nasal congestion, sneezing, itching, epistaxis, ear pain, congestion, decreased hearing, ringing in ears, vertigo, or tinnitus.  No oral lesions, sore throat, pain on swallowing, or hoarseness. Neck:  No mass, tenderness or adenopathy. Lungs:  No coughing, wheezing, or shortness of breath. Skin:  No rash or itching.  PMFSH:  Past medical history, family history, social history, meds, and allergies were reviewed. She is allergic to Demerol and Pepcid. She's also allergic to IV contrast dye. She has a history of anxiety, gastroesophageal reflux, diabetes, hypertension, bipolar disorder, and COPD. She takes albuterol, Tenormin, Klonopin, furosemide, Neurontin, Glucotrol, lithium, metformin, Zyprexa, Protonix, ramipril, and Ambien.  Physical Exam:   Vital signs:  BP 114/85  Pulse 86  Temp(Src) 98.4 F (36.9 C) (Oral)  Resp 16  SpO2 100% General:  Alert and oriented.  In no  distress.  Skin warm and dry. Eye:  PERRL, full EOMs, lids and conjunctiva normal.   ENT:  TMs and canals clear.  Nasal mucosa not congested and without drainage.  Mucous membranes moist, no oral lesions, normal dentition, pharynx clear.  No cranial or facial pain to palplation. The entire left side of the tongue was swollen, but it was not erythematous. There no ulcerations on the tongue. It was not tender to palpation. The pharynx is clear the airway is open and widely patent. Neck:  Supple, full ROM.  No adenopathy, tenderness or mass.  Thyroid normal. Lungs:  Breath sounds clear and equal bilaterally.  No wheezes, rales or rhonchi. Heart:  Rhythm regular, without extrasystoles.  No gallops or murmers. Skin:  Clear, warm and dry.  Course in Urgent Care Center:   She was started on IV normal saline at 50 mL per hour and given Solu-Medrol 125 mg IV and diphenhydramine 25 mg IV.  Assessment:  The encounter diagnosis was Angioedema, initial encounter.  This is most likely due to ramipril. She was told not to take any further ramipril, Altase or any other ACE inhibitor. She is in no respiratory distress right now, but this may progress further and may lead to respiratory compromise, therefore she needs to be observed in the emergency room. Her tongue swelling seemed to progress, therefore she was given epinephrine 0.3 mL IM.  Plan:   1.  The following meds were prescribed:   New Prescriptions   No medications on file   2.  The patient was transported to the emergency department via [facility] in stable condition.  Medical Decision Making:  47 year old female with multiple medical problems (DM, HT, COPD) who is taking ramipril who developed painless swelling of the left side of the tongue today at 1 p.m. No suspicious ingestion or exposures.  On exam the left side of her tongue is markedly swollen.  Airway is patent, no resp distress.  We will start IV NS and give SoluMedrol 125 mg IV and Benadryl  25 mg IV and transfer to the ED for observation.  She has angioedema due to ramipril. She was also given epinephrine 0.3 mL IM.     Reuben Likes, MD 08/14/12 1720  Reuben Likes, MD 08/14/12 [phone]

## 2012-08-14 NOTE — ED Notes (Signed)
Pt  Out  The  Door  By  [facility]  Airway  Intact  But  Tongue  Remains  Swollen  And      Large

## 2012-08-14 NOTE — Anesthesia Preprocedure Evaluation (Signed)
Anesthesia Evaluation Anesthesia Physical Anesthesia Plan Anesthesia Quick Evaluation  

## 2012-08-14 NOTE — ED Notes (Signed)
Intubated by anesthesia without difficulty. RT at bedside, placed on vent

## 2012-08-14 NOTE — ED Notes (Signed)
EDP and Resident to bedside and explaining condition and possible complications.  Pt was placed on NRB.

## 2012-08-14 NOTE — ED Notes (Signed)
Reports awoke this morning with edema to tongue L>R which progressively worsened throughout the day. Denies edema to lips, throat. Denies SOB. Pt tachypneic, RR >36/min, states is very nervous & scared. Pt appears very anxious, speech affected by edema to tongue. Denies any pain

## 2012-08-14 NOTE — ED Notes (Signed)
Pt  Reports  About  4   Hours  Ago  She  noticed  Her  Tongue  Swelling  This  Is  Mainly  On the  Left  Side    She  denys  Any  New  meds  She  denys  Any  Known  Causative  Agents    She  Is  Sitting  Upright on  Exam table  Speaking  In  Complete  sentances    And  The  Airway  Is  Intact  With  No  resp  Distress

## 2012-08-14 NOTE — ED Provider Notes (Signed)
History     CSN: [id number]  Arrival date & time 08/14/12  1744   First MD Initiated Contact with Patient 08/14/12 1753      Chief Complaint  Patient presents with  . Angioedema    (Consider location/radiation/quality/duration/timing/severity/associated sxs/prior treatment) Patient is a 47 y.o. female presenting with general illness.  Illness Location:  Tongue Quality:  Swelling Severity:  Moderate Onset quality:  Gradual Duration:  3 hours Timing:  Constant Progression:  Worsening Chronicity:  New Context:  Ramipril, no known exposure Relieved by:  Nothing Worsened by:  Nothing Associated symptoms: no abdominal pain, no chest pain, no congestion, no cough, no diarrhea, no fever, no nausea, no rash, no rhinorrhea, no shortness of breath, no sore throat and no vomiting     Past Medical History  Diagnosis Date  . Anxiety   . GERD (gastroesophageal reflux disease)   . Diabetes mellitus   . Hypertension   . Bipolar 1 disorder     Past Surgical History  Procedure Laterality Date  . Abdominal hysterectomy    . Leg surgery    . Cholecystectomy    . Tubal ligation      No family history on file.  History  Substance Use Topics  . Smoking status: Never Smoker   . Smokeless tobacco: Not on file  . Alcohol Use: No    OB History   Grav Para Term Preterm Abortions TAB SAB Ect Mult Living                  Review of Systems  Constitutional: Negative for fever and chills.  HENT: Negative for congestion, sore throat and rhinorrhea.   Eyes: Negative for photophobia and visual disturbance.  Respiratory: Negative for cough and shortness of breath.   Cardiovascular: Negative for chest pain and leg swelling.  Gastrointestinal: Negative for nausea, vomiting, abdominal pain, diarrhea and constipation.  Endocrine: Negative for polyphagia and polyuria.  Genitourinary: Negative for dysuria, flank pain, vaginal bleeding, vaginal discharge and enuresis.  Musculoskeletal:  Negative for back pain and gait problem.  Skin: Negative for color change and rash.  Neurological: Negative for dizziness, syncope, light-headedness and numbness.  Hematological: Negative for adenopathy. Does not bruise/bleed easily.  All other systems reviewed and are negative.    Allergies  Famotidine; Meperidine hcl; and Iohexol  Home Medications   No current outpatient prescriptions on file.  BP 95/55  Pulse 86  Temp(Src) 99.1 F (37.3 C) (Oral)  Resp 16  Wt 242 lb 8.1 oz (110 kg)  BMI 42.97 kg/m2  SpO2 97%  Physical Exam  Vitals reviewed. Constitutional: She is oriented to person, place, and time. She appears well-developed and well-nourished.  HENT:  Head: Atraumatic.  Right Ear: External ear normal.  Left Ear: External ear normal.  L side of tongue swollen, no stridor or airway difficulty  Eyes: Conjunctivae and EOM are normal. Pupils are equal, round, and reactive to light.  Neck: Normal range of motion. Neck supple.  Cardiovascular: Normal rate, regular rhythm, normal heart sounds and intact distal pulses.   Pulmonary/Chest: Effort normal and breath sounds normal.  Abdominal: Soft. Bowel sounds are normal. There is no tenderness.  Musculoskeletal: Normal range of motion.  Neurological: She is alert and oriented to person, place, and time.  Skin: Skin is warm and dry.    ED Course  Procedures (including critical care time)  Labs Reviewed  BASIC METABOLIC PANEL - Abnormal; Notable for the following:    Glucose, Bld 274 (*)  GFR calc non Af Amer 68 (*)    GFR calc Af Amer 79 (*)    All other components within normal limits  CBC WITH DIFFERENTIAL - Abnormal; Notable for the following:    WBC 14.0 (*)    MCV 76.3 (*)    MCH 25.8 (*)    Platelets 486 (*)    Lymphs Abs 5.7 (*)    All other components within normal limits  CBC - Abnormal; Notable for the following:    WBC 20.4 (*)    MCV 75.6 (*)    MCH 25.6 (*)    Platelets 488 (*)    All other  components within normal limits  BASIC METABOLIC PANEL - Abnormal; Notable for the following:    Glucose, Bld 262 (*)    GFR calc non Af Amer 71 (*)    GFR calc Af Amer 82 (*)    All other components within normal limits  GLUCOSE, CAPILLARY - Abnormal; Notable for the following:    Glucose-Capillary 296 (*)    All other components within normal limits  POCT I-STAT 3, BLOOD GAS (G3+) - Abnormal; Notable for the following:    pH, Arterial 7.236 (*)    pCO2 arterial 57.9 (*)    Bicarbonate 24.6 (*)    Acid-base deficit 4.0 (*)    All other components within normal limits  POCT I-STAT 3, BLOOD GAS (G3+) - Abnormal; Notable for the following:    pCO2 arterial 45.4 (*)    pO2, Arterial 117.0 (*)    Bicarbonate 25.6 (*)    All other components within normal limits  MRSA PCR SCREENING  CBC  BASIC METABOLIC PANEL  BLOOD GAS, ARTERIAL   Dg Chest Portable 1 View  08/14/2012   *RADIOLOGY REPORT*  Clinical Data: Intubation.  Pullback endotracheal tube.  PORTABLE CHEST - 1 VIEW  Comparison: 08/14/2012 at 1824 hours.  Findings:  Current radiograph at 1839 hours.  The endotracheal tube has been pulled back.  The carina is difficult to discretely visualize, but the endotracheal tube is estimated to be 1.5 cm above the carina.  Lung volumes are low. There is interstitial prominence bilaterally which is likely accentuated by the low lung volumes.  Suspect left basilar airspace opacity.  IMPRESSION: 1.  Endotracheal tube is approximately 1.5 cm above the level of the carina. 2.  Interstitial prominence bilaterally and probable left basilar airspace disease. Question aspiration or pneumonia.  Congestive heart failure difficult to exclude.   Original Report Authenticated By: Britta Mccreedy, M.D.   Dg Chest Portable 1 View  08/14/2012   *RADIOLOGY REPORT*  Clinical Data: Intubated.  PORTABLE CHEST - 1 VIEW  Comparison: 08/24/2011.  Findings: Endotracheal tube tip in the right mainstem bronchus, 2.5 cm below the  carina.  Mild diffuse left lung airspace opacity. Increased prominence of the interstitial markings, accentuated by a decreased inspiration.  Mild scoliosis.  IMPRESSION:  1.  Endotracheal tube tip in the right mainstem bronchus.  It is recommended that this be retracted 4 cm. 2.  Mild diffuse left lung airspace opacity, most likely due to atelectasis.  Early pneumonia is also a possibility. 3.  Increased prominence of the interstitial markings, at least partly due to a decreased depth of inspiration.  Developing interstitial pulmonary edema is also a possibility.  These  results  will  be  called  to  the  ordering  clinician   or representative  by  the  Radiologist Assistant,  and  communication documented  in the PACS Dashboard.   Original Report Authenticated By: Beckie Salts, M.D.     1. ACE inhibitor-aggravated angioedema, initial encounter   2. Anxiety state, unspecified   3. Essential hypertension, benign   4. Type II or unspecified type diabetes mellitus without mention of complication, not stated as uncontrolled   5. Acute respiratory failure with hypoxia       MDM  47 y.o. female  with pertinent PMH of DM, HTN, COPD presents with tongue swelling beginning around 1 PM.  Swelling on L side, initially not painful however became painful in 4 hours leading to visit here.  No suspect ingestions or exposures.  Pt is on ramipril, which she has been on for years.  On arrival here pt tachypneic, however obviously anxious, no wheezing or stridor.  She had already received solumedrol, epi, and benadryl.  Anesthesia called.  Anesthesia intubated pt.  Pt then sedated with propofol and fentanyl.  Admitted to ICU in stable condition.  Labs and imaging as above reviewed by myself and attending,Dr. Bebe Shaggy, with whom case was discussed.   1. ACE inhibitor-aggravated angioedema, initial encounter   2. Anxiety state, unspecified   3. Essential hypertension, benign   4. Type II or unspecified type  diabetes mellitus without mention of complication, not stated as uncontrolled   5. Acute respiratory failure with hypoxia             Noel Gerold, MD 08/15/12 0002

## 2012-08-14 NOTE — ED Notes (Signed)
Placed  On  Cardiac  Monitor  /  Nasal  o2  At  32  l  /  Min

## 2012-08-14 NOTE — H&P (Signed)
PULMONARY  / CRITICAL CARE MEDICINE  Name: Cheyenne Alvarez MRN: [id number] DOB: [birth year]  3  ADMISSION DATE:  08/14/2012 CONSULTATION DATE:  08/14/2012  REFERRING MD :  Bebe Shaggy PRIMARY SERVICE: PCCM  CHIEF COMPLAINT:  Tongue swelling  BRIEF PATIENT DESCRIPTION: 47 y/o female with HTN on ramipril developed tongue swelling on 6/17 requiring intubation in the [facility] ED.    SIGNIFICANT EVENTS / STUDIES:  6/17 admission  LINES / TUBES: 6/17 oral ETT >>  CULTURES: n/a  ANTIBIOTICS: n/a  HISTORY OF PRESENT ILLNESS:  47 y/o female with HTN on ramipril developed tongue swelling on 6/17 requiring intubation in the [facility] ED.  She noted the acute onset of tongue swelling on 6/17 after being on ramipril for several years.  She was intubated for airway protection.  No further history could be provided because the patient was intubated on my exam.  PAST MEDICAL HISTORY :  Past Medical History  Diagnosis Date  . Anxiety   . GERD (gastroesophageal reflux disease)   . Diabetes mellitus   . Hypertension   . Bipolar 1 disorder    Past Surgical History  Procedure Laterality Date  . Abdominal hysterectomy    . Leg surgery    . Cholecystectomy    . Tubal ligation     Prior to Admission medications   Medication Sig Start Date End Date Taking? Authorizing Provider  albuterol (PROVENTIL HFA;VENTOLIN HFA) 108 (90 BASE) MCG/ACT inhaler Inhale 2 puffs into the lungs every 6 (six) hours as needed for wheezing. For wheezing   Yes Historical Provider, MD  aspirin EC 81 MG tablet Take 81 mg by mouth every morning.   Yes Historical Provider, MD  atenolol (TENORMIN) 25 MG tablet Take 25 mg by mouth 2 (two) times daily.   Yes Historical Provider, MD  clonazePAM (KLONOPIN) 0.5 MG tablet Take 0.5 mg by mouth 3 (three) times daily.   Yes Historical Provider, MD  diphenhydrAMINE (BENADRYL) 25 mg capsule Take 50 mg by mouth at bedtime.   Yes Historical Provider, MD  furosemide (LASIX) 20 MG tablet Take 20 mg by  mouth 2 (two) times daily.    Yes Historical Provider, MD  gabapentin (NEURONTIN) 300 MG capsule Take 300 mg by mouth 3 (three) times daily.    Yes Historical Provider, MD  glimepiride (AMARYL) 4 MG tablet Take 4 mg by mouth daily before breakfast.   Yes Historical Provider, MD  glipiZIDE (GLUCOTROL XL) 10 MG 24 hr tablet Take 10 mg by mouth 2 (two) times daily.   Yes Historical Provider, MD  hydrOXYzine (VISTARIL) 50 MG capsule Take 100 mg by mouth 3 (three) times daily.   Yes Historical Provider, MD  insulin NPH-regular (NOVOLIN 70/30) (70-30) 100 UNIT/ML injection Inject 25 Units into the skin 3 (three) times daily.   Yes Historical Provider, MD  OLANZapine (ZYPREXA) 20 MG tablet Take 20 mg by mouth at bedtime.   Yes Historical Provider, MD  pantoprazole (PROTONIX) 40 MG tablet Take 40 mg by mouth daily.   Yes Historical Provider, MD  ramipril (ALTACE) 10 MG capsule Take 10 mg by mouth daily.   Yes Historical Provider, MD  zolpidem (AMBIEN) 10 MG tablet Take 10 mg by mouth at bedtime.    Yes Historical Provider, MD   Allergies  Allergen Reactions  . Famotidine Anaphylaxis  . Meperidine Hcl Anaphylaxis  . Iohexol Itching and Rash    FAMILY HISTORY:  Cannot obtain due to intubation SOCIAL HISTORY:  reports that she has never  smoked. She does not have any smokeless tobacco history on file. She reports that she does not drink alcohol or use illicit drugs.  REVIEW OF SYSTEMS:  Cannot obtain due to intubation  SUBJECTIVE:   VITAL SIGNS: Temp:  [98.4 F (36.9 C)-99 F (37.2 C)] 99 F (37.2 C) (06/17 1752) Pulse Rate:  [86-118] 106 (06/17 2020) Resp:  [16-37] 21 (06/17 2020) BP: (108-199)/(67-152) 137/71 mmHg (06/17 2020) SpO2:  [98 %-100 %] 99 % (06/17 2020) FiO2 (%):  [40 %-50 %] 40 % (06/17 2020) Weight:  [116 kg (255 lb 11.7 oz)] 116 kg (255 lb 11.7 oz) (06/17 1759) HEMODYNAMICS:   VENTILATOR SETTINGS: Vent Mode:  [-] PRVC FiO2 (%):  [40 %-50 %] 40 % Set Rate:  [14 bmp-18  bmp] 18 bmp Vt Set:  [500 mL] 500 mL PEEP:  [5 cmH20] 5 cmH20 INTAKE / OUTPUT: Intake/Output   None     PHYSICAL EXAMINATION: General:  Sedated on vent Neuro:  Sedated on vent, arouses to voice, follows commands HEENT:  NCAT, ETT in place, no obvious tongue swelling on my exam (limited by ETT) Cardiovascular:  RRR, distant heart sounds, no clear mgr Lungs:  CTA B Abdomen:  BS+, soft, nontender Musculoskeletal:  Normal bulk and tone Skin:  No skin breakdown  LABS:  Recent Labs Lab 08/14/12 1820 08/14/12 1843  HGB 12.3  --   WBC 14.0*  --   PLT 486*  --   NA 136  --   K 3.8  --   CL 98  --   CO2 28  --   GLUCOSE 274*  --   BUN 11  --   CREATININE 0.97  --   CALCIUM 9.6  --   PHART  --  7.236*  PCO2ART  --  57.9*  PO2ART  --  100.0   No results found for this basename: GLUCAP,  in the last 168 hours  CXR: ETT in place, questionable L lung infiltrate EKG: pending  ASSESSMENT / PLAN:  PULMONARY A:Tongue Angioedema due to ACE-Inhibitor, now stable post intubation;  Respiratory acidosis, uncertain etiology, but currently breathing comfortably with good respiratory mechanics Question Left lung infiltrate on CXR, no secretions, no other clinical evidence of pneumonia P:   -full vent support -repeat ABG in one hour to re-evaluate respiratory acidosis -no role for steroids -hold all ACE inhibitors for life -monitor for secretions, repeat CXR in AM  CARDIOVASCULAR A: Hypertension P:  -continue home atenalol -no more ACE -Inhibitors -tele -12 lead now  RENAL A:  No acute issues P:   -monitor UOP -BMET in AM  GASTROINTESTINAL A:  No acute issues P:   -NG tube -PPI for stress ulcer prophylaxis  HEMATOLOGIC A:  No acute issues P:  -monitor for bleeding  INFECTIOUS A:  No acute issues, questionable infiltrate in L lung CXR, no clinical evidence of pneumonia P:   -monitor respiratory secretions, fever -unasyn if fever  ENDOCRINE A:  DM2, high  dose insulin at home (25 units 70/30 tid)   P:   -low dose lantus QHS while npo -regular scale ssi q4h  NEUROLOGIC A:  No acute issues P:   -propofol and fentanyl as needed for sedation on vent, titrated to RASS -1  TODAY'S SUMMARY: 47 y/o female with ACE-Inhibitor induced angioedema requiring intubation in ED. Currently stable.  Hopefully extubate in 6/18.  I have personally obtained a history, examined the patient, evaluated laboratory and imaging results, formulated the assessment and plan and placed  orders. CRITICAL CARE: The patient is critically ill with multiple organ systems failure and requires high complexity decision making for assessment and support, frequent evaluation and titration of therapies, application of advanced monitoring technologies and extensive interpretation of multiple databases. Critical Care Time devoted to patient care services described in this note is 45 minutes.   Fonnie Jarvis Pulmonary and Critical Care Medicine [facility] Pager: [phone]  08/14/2012, 8:23 PM

## 2012-08-15 ENCOUNTER — Inpatient Hospital Stay (HOSPITAL_COMMUNITY): Payer: Medicare Other

## 2012-08-15 DIAGNOSIS — T783XXA Angioneurotic edema, initial encounter: Secondary | ICD-10-CM | POA: Diagnosis not present

## 2012-08-15 DIAGNOSIS — F319 Bipolar disorder, unspecified: Secondary | ICD-10-CM

## 2012-08-15 DIAGNOSIS — N179 Acute kidney failure, unspecified: Secondary | ICD-10-CM | POA: Diagnosis not present

## 2012-08-15 DIAGNOSIS — R0989 Other specified symptoms and signs involving the circulatory and respiratory systems: Secondary | ICD-10-CM | POA: Diagnosis not present

## 2012-08-15 DIAGNOSIS — E119 Type 2 diabetes mellitus without complications: Secondary | ICD-10-CM | POA: Diagnosis not present

## 2012-08-15 DIAGNOSIS — E872 Acidosis: Secondary | ICD-10-CM | POA: Diagnosis not present

## 2012-08-15 DIAGNOSIS — Z5189 Encounter for other specified aftercare: Secondary | ICD-10-CM

## 2012-08-15 DIAGNOSIS — J9600 Acute respiratory failure, unspecified whether with hypoxia or hypercapnia: Secondary | ICD-10-CM | POA: Diagnosis not present

## 2012-08-15 LAB — URINALYSIS, ROUTINE W REFLEX MICROSCOPIC
Bilirubin Urine: NEGATIVE
Glucose, UA: NEGATIVE mg/dL
Hgb urine dipstick: NEGATIVE
Ketones, ur: NEGATIVE mg/dL
pH: 5.5 (ref 5.0–8.0)

## 2012-08-15 LAB — BASIC METABOLIC PANEL
BUN: 18 mg/dL (ref 6–23)
BUN: 18 mg/dL (ref 6–23)
BUN: 18 mg/dL (ref 6–23)
CO2: 24 mEq/L (ref 19–32)
CO2: 23 mEq/L (ref 19–32)
Chloride: 98 mEq/L (ref 96–112)
Chloride: 99 mEq/L (ref 96–112)
Creatinine, Ser: 1.25 mg/dL — ABNORMAL HIGH (ref 0.50–1.10)
Creatinine, Ser: 1.13 mg/dL — ABNORMAL HIGH (ref 0.50–1.10)
GFR calc Af Amer: 45 mL/min — ABNORMAL LOW (ref >90)
GFR calc Af Amer: 58 mL/min — ABNORMAL LOW (ref >90)
GFR calc non Af Amer: 50 mL/min — ABNORMAL LOW (ref >90)
Glucose, Bld: 190 mg/dL — ABNORMAL HIGH (ref 70–99)
Potassium: 6 mEq/L — ABNORMAL HIGH (ref 3.5–5.1)

## 2012-08-15 LAB — CBC
HCT: 35.9 % — ABNORMAL LOW (ref 36.0–46.0)
RBC: 4.7 MIL/uL (ref 3.87–5.11)
RDW: 14.9 % (ref 11.5–15.5)
WBC: 14.1 10*3/uL — ABNORMAL HIGH (ref 4.0–10.5)

## 2012-08-15 LAB — GLUCOSE, CAPILLARY
Glucose-Capillary: 275 mg/dL — ABNORMAL HIGH (ref 70–99)
Glucose-Capillary: 195 mg/dL — ABNORMAL HIGH (ref 70–99)

## 2012-08-15 MED ORDER — CLONAZEPAM 0.5 MG PO TABS
0.5000 mg | ORAL_TABLET | Freq: Three times a day (TID) | ORAL | Status: DC
  Administered 2012-08-15 – 2012-08-17 (×7): 0.5 mg via ORAL
  Filled 2012-08-15 (×7): qty 1

## 2012-08-15 MED ORDER — ZOLPIDEM TARTRATE 5 MG PO TABS
5.0000 mg | ORAL_TABLET | Freq: Every evening | ORAL | Status: DC | PRN
  Administered 2012-08-16 (×2): 5 mg via ORAL
  Filled 2012-08-15 (×2): qty 1

## 2012-08-15 MED ORDER — SODIUM POLYSTYRENE SULFONATE 15 GM/60ML PO SUSP
30.0000 g | Freq: Once | ORAL | Status: AC
  Administered 2012-08-15: 30 g
  Filled 2012-08-15: qty 120

## 2012-08-15 MED ORDER — ZOLPIDEM TARTRATE 5 MG PO TABS: 10.0000 mg | ORAL_TABLET | Freq: Every evening | ORAL | Status: DC | PRN

## 2012-08-15 MED ORDER — ACETAMINOPHEN 325 MG PO TABS
650.0000 mg | ORAL_TABLET | Freq: Four times a day (QID) | ORAL | Status: DC | PRN
  Administered 2012-08-16 (×2): 650 mg via ORAL
  Filled 2012-08-15 (×2): qty 2

## 2012-08-15 MED ORDER — PANTOPRAZOLE SODIUM 40 MG PO TBEC
40.0000 mg | DELAYED_RELEASE_TABLET | Freq: Every day | ORAL | Status: DC
  Administered 2012-08-15 – 2012-08-16 (×2): 40 mg via ORAL
  Filled 2012-08-15 (×2): qty 1

## 2012-08-15 MED ORDER — CHLORHEXIDINE GLUCONATE 0.12 % MT SOLN
15.0000 mL | Freq: Two times a day (BID) | OROMUCOSAL | Status: DC
  Administered 2012-08-15: 15 mL via OROMUCOSAL
  Filled 2012-08-15: qty 15

## 2012-08-15 MED ORDER — SODIUM CHLORIDE 0.45 % IV SOLN
INTRAVENOUS | Status: DC
  Administered 2012-08-15 – 2012-08-16 (×2): via INTRAVENOUS

## 2012-08-15 MED ORDER — ASPIRIN 81 MG PO CHEW
81.0000 mg | CHEWABLE_TABLET | Freq: Every day | ORAL | Status: DC
  Administered 2012-08-15 – 2012-08-17 (×3): 81 mg via ORAL
  Filled 2012-08-15 (×3): qty 1

## 2012-08-15 MED ORDER — BIOTENE DRY MOUTH MT LIQD
15.0000 mL | Freq: Two times a day (BID) | OROMUCOSAL | Status: DC
  Administered 2012-08-15: 15 mL via OROMUCOSAL

## 2012-08-15 NOTE — Progress Notes (Signed)
Utilization review completed.  P.J. Steph Cheadle,RN,BSN Case Manager [phone]  

## 2012-08-15 NOTE — Progress Notes (Signed)
Respiratory therapy note- positive cuff leak.

## 2012-08-15 NOTE — Progress Notes (Signed)
PULMONARY  / CRITICAL CARE MEDICINE  Name: Cheyenne Alvarez MRN: [id number] DOB: [birth year]  3  ADMISSION DATE:  08/14/2012 CONSULTATION DATE:  08/14/2012  REFERRING MD :  Bebe Shaggy PRIMARY SERVICE: PCCM  CHIEF COMPLAINT:  Tongue swelling  BRIEF PATIENT DESCRIPTION: 47 y/o female with HTN on ramipril developed tongue swelling on 6/17 requiring intubation in the [facility] ED.    SIGNIFICANT EVENTS / STUDIES:  6/17 admission  LINES / TUBES: 6/17 oral ETT >> 6/18  SUBJECTIVE:  Tolerating SBT.  VITAL SIGNS: Temp:  [98 F (36.7 C)-99.1 F (37.3 C)] 98 F (36.7 C) (06/18 0821) Pulse Rate:  [77-118] 92 (06/18 0800) Resp:  [14-37] 24 (06/18 0800) BP: (90-199)/(55-152) 141/83 mmHg (06/18 0800) SpO2:  [96 %-100 %] 98 % (06/18 0800) FiO2 (%):  [40 %-50 %] 40 % (06/18 0800) Weight:  [242 lb 8.1 oz (110 kg)-255 lb 11.7 oz (116 kg)] 242 lb 8.1 oz (110 kg) (06/18 0437) VENTILATOR SETTINGS: Vent Mode:  [-] PSV;CPAP FiO2 (%):  [40 %-50 %] 40 % Set Rate:  [14 bmp-18 bmp] 16 bmp Vt Set:  [400 mL-500 mL] 400 mL PEEP:  [5 cmH20] 5 cmH20 Pressure Support:  [10 cmH20] 10 cmH20 Plateau Pressure:  [17 cmH20-19 cmH20] 17 cmH20 INTAKE / OUTPUT: Intake/Output     06/17 0701 - 06/18 0700 06/18 0701 - 06/19 0700   I.V. (mL/kg) 522.6 (4.8) 20.7 (0.2)   Total Intake(mL/kg) 522.6 (4.8) 20.7 (0.2)   Net +522.6 +20.7        Urine Occurrence 5 x      PHYSICAL EXAMINATION: General: No distress Neuro: RASS 0, follows commands, normal strength HEENT: ETT in place, no obvious tongue/lip swelling Cardiovascular: regular Lungs: scattered rhonchi Abdomen: soft, non tender Musculoskeletal: no edema Skin:  No rashes  LABS:  Recent Labs Lab 08/14/12 1820 08/14/12 1843 08/14/12 2009 08/14/12 2214 08/15/12 0425  HGB 12.3  --  12.3  --  12.0  WBC 14.0*  --  20.4*  --  14.1*  PLT 486*  --  488*  --  475*  NA 136  --  135  --  132*  K 3.8  --  3.7  --  6.0*  CL 98  --  97  --  98  CO2 28  --  26  --   24  GLUCOSE 274*  --  262*  --  292*  BUN 11  --  11  --  18  CREATININE 0.97  --  0.94  --  1.56*  CALCIUM 9.6  --  9.4  --  9.7  PHART  --  7.236*  --  7.361  --   PCO2ART  --  57.9*  --  45.4*  --   PO2ART  --  100.0  --  117.0*  --     Recent Labs Lab 08/14/12 2029 08/15/12 0046 08/15/12 0330 08/15/12 0816  GLUCAP 296* 261* 275* 238*    Imaging: Dg Chest Port 1 View  08/15/2012   *RADIOLOGY REPORT*  Clinical Data: Evaluate endotracheal tube position.  PORTABLE CHEST - 1 VIEW  Comparison: Chest x-ray 08/14/2012.  Findings: An endotracheal tube is in place with tip 2.9 cm above the carina. A nasogastric tube is seen extending into the stomach, however, the tip of the nasogastric tube extends below the lower margin of the image.  Lung volumes are low.  Patchy multifocal interstitial and ill-defined airspace opacities throughout the left lung, concerning for developing multilobar bronchopneumonia.  Mild  pulmonary venous congestion, without frank pulmonary edema. Possible small left pleural effusion.  Heart size is mildly enlarged.  Mediastinal contours are unremarkable.  IMPRESSION: 1.  Support apparatus, as above. 2.  Worsening aeration throughout the left lung, concerning for developing multilobar bronchopneumonia.  Possible small left pleural effusion. 3.  Mild cardiomegaly with pulmonary venous congestion.   Original Report Authenticated By: Trudie Reed, M.D.   Dg Chest Portable 1 View  08/14/2012   *RADIOLOGY REPORT*  Clinical Data: Intubation.  Pullback endotracheal tube.  PORTABLE CHEST - 1 VIEW  Comparison: 08/14/2012 at 1824 hours.  Findings:  Current radiograph at 1839 hours.  The endotracheal tube has been pulled back.  The carina is difficult to discretely visualize, but the endotracheal tube is estimated to be 1.5 cm above the carina.  Lung volumes are low. There is interstitial prominence bilaterally which is likely accentuated by the low lung volumes.  Suspect left basilar  airspace opacity.  IMPRESSION: 1.  Endotracheal tube is approximately 1.5 cm above the level of the carina. 2.  Interstitial prominence bilaterally and probable left basilar airspace disease. Question aspiration or pneumonia.  Congestive heart failure difficult to exclude.   Original Report Authenticated By: Britta Mccreedy, M.D.   Dg Chest Portable 1 View  08/14/2012   *RADIOLOGY REPORT*  Clinical Data: Intubated.  PORTABLE CHEST - 1 VIEW  Comparison: 08/24/2011.  Findings: Endotracheal tube tip in the right mainstem bronchus, 2.5 cm below the carina.  Mild diffuse left lung airspace opacity. Increased prominence of the interstitial markings, accentuated by a decreased inspiration.  Mild scoliosis.  IMPRESSION:  1.  Endotracheal tube tip in the right mainstem bronchus.  It is recommended that this be retracted 4 cm. 2.  Mild diffuse left lung airspace opacity, most likely due to atelectasis.  Early pneumonia is also a possibility. 3.  Increased prominence of the interstitial markings, at least partly due to a decreased depth of inspiration.  Developing interstitial pulmonary edema is also a possibility.  These  results  will  be  called  to  the  ordering  clinician   or representative  by  the  Radiologist Assistant,  and  communication documented in the PACS Dashboard.   Original Report Authenticated By: Beckie Salts, M.D.     ASSESSMENT / PLAN:  PULMONARY A: Angioedema presumably due to ACE inhibitor resulting in VDRF >> improved 6/18. P:   -proceed with extubation -bronchial hygiene -f/u CXR  CARDIOVASCULAR A: Hypertension. P:  -continue atenalol -no more ACE -Inhibitors ever -continue ASA  RENAL A: Acute renal failure. Hyperkalemia >> given kayexalate 6/18. P:   -f/u BMET -add IV fluids 1/2 NS at 50 ml/hr -monitor urine outpt -check FeNA  GASTROINTESTINAL A:  Nutrition. P:   -Advance diet after extubation -protonix for SUP  HEMATOLOGIC A: Leukocytosis >> likely from acute  stress response. P:  -f/u CBC  INFECTIOUS A: Infiltrate on CXR, but no other evidence for pneumonia. P:   -f/u CXR, check procalcitonin  ENDOCRINE A:  DM2, high dose insulin at home (25 units 70/30 tid)   P:   -SSI with lantus -hold amaryl, glucotrol  NEUROLOGIC A: Hx of Bipolar, insomnia. P:   -continue klonopin, zyprexa, ambien, neurontin  Updated family at bedside.  CC time 35 minutes.  Coralyn Helling, MD [facility] Pulmonary/Critical Care 08/15/2012, 9:09 AM Pager:  [phone] After 3pm call: [phone]

## 2012-08-15 NOTE — Progress Notes (Signed)
08/15/12 0650  CRITICAL VALUE ALERT  Critical value received:  K- 6.0   Date of notification:  08/15/12  Time of notification:  0600  Critical value read back: YES  Nurse who received alert:  Elisha Headland RN  MD notified (1st page):  Ramaswamy  Time of first page:  0600  Responding MD:  Marchelle Gearing  Time MD responded:  1610  I: Kayexalate to be given per tube. Follow up BMP in 2hrs.   Elisha Headland RN

## 2012-08-15 NOTE — Procedures (Signed)
Extubation Procedure Note  Patient Details:   Name: Cheyenne Alvarez DOB: [birth year] MRN: [id number]   Airway Documentation:     Evaluation  O2 sats: stable throughout Complications: No apparent complications Patient did tolerate procedure well. Bilateral Breath Sounds: Clear;Diminished Suctioning: Airway Yes Positive cuff leak prior to extubation and no stridor post extubation  Newt Lukes 08/15/2012, 9:20 AM

## 2012-08-15 NOTE — Progress Notes (Signed)
Patient ID: RITISHA DEITRICK, female   DOB: May 10, 1965, 47 y.o.   MRN: [id number]   eLink Physician Progress Note and Electrolyte Replacement  Patient Name: ROCHEL PRIVETT DOB: 1966-01-16 MRN: [id number]  Date of Service  08/15/2012   HPI/Events of Note    Recent Labs Lab 08/14/12 1820 08/14/12 2009 08/15/12 0425  NA 136 135 132*  K 3.8 3.7 6.0*  CL 98 97 98  CO2 28 26 24   GLUCOSE 274* 262* 292*  BUN 11 11 18   CREATININE 0.97 0.94 1.56*  CALCIUM 9.6 9.4 9.7    The CrCl is unknown because both a height and weight (above a minimum accepted value) are required for this calculation.  Intake/Output     06/17 0701 - 06/18 0700   I.V. (mL/kg) 491.2 (4.5)   Total Intake(mL/kg) 491.2 (4.5)   Net +491.2       Urine Occurrence 5 x    - I/O DETAILED x 24h    Total I/O In: 491.2 [I.V.:491.2] Out: -  - I/O THIS SHIFT    ASSESSMENT Hyperkalemia New onset renalfailure  eICURN Interventions  Kayexalate Recheck bmet in 3h   ASSESSMENT: MAJOR ELECTROLYTE      Dr. Kalman Shan, M.D., F.C.C.P Pulmonary and Critical Care Medicine Staff Physician [facility] Pulmonary and Critical Care Pager: [phone], If no answer or between  15:00h - 7:00h: call [phone]  08/15/2012 6:56 AM

## 2012-08-15 NOTE — Progress Notes (Signed)
Inpatient Diabetes Program Recommendations  AACE/ADA: New Consensus Statement on Inpatient Glycemic Control (2013)  Target Ranges:  Prepandial:   less than 140 mg/dL      Peak postprandial:   less than 180 mg/dL (1-2 hours)      Critically ill patients:  140 - 180 mg/dL   Reason for Visit:  Results for Cheyenne Alvarez, Cheyenne Alvarez (MRN [id number]) as of 08/15/2012 09:39  Ref. Range 08/24/2011 17:04 08/14/2012 20:29 08/15/2012 00:46 08/15/2012 03:30 08/15/2012 08:16  Glucose-Capillary Latest Range: 70-99 mg/dL 295 (H) 621 (H) 308 (H) 275 (H) 238 (H)   According to medication reconciliation, patient was on Novolog 70/30 25 units tid prior to admission.  Consider increasing Lantus to 30 units q HS and add Novolog meal coverage 5 units tid with meals. Note that patient did receive Solumedrol which also has likely contributed to elevated CBG's.

## 2012-08-16 ENCOUNTER — Inpatient Hospital Stay (HOSPITAL_COMMUNITY): Payer: Medicare Other

## 2012-08-16 DIAGNOSIS — J9600 Acute respiratory failure, unspecified whether with hypoxia or hypercapnia: Secondary | ICD-10-CM | POA: Diagnosis not present

## 2012-08-16 DIAGNOSIS — T465X5A Adverse effect of other antihypertensive drugs, initial encounter: Secondary | ICD-10-CM | POA: Diagnosis not present

## 2012-08-16 DIAGNOSIS — R109 Unspecified abdominal pain: Secondary | ICD-10-CM | POA: Diagnosis not present

## 2012-08-16 DIAGNOSIS — F319 Bipolar disorder, unspecified: Secondary | ICD-10-CM | POA: Diagnosis not present

## 2012-08-16 DIAGNOSIS — T783XXA Angioneurotic edema, initial encounter: Secondary | ICD-10-CM | POA: Diagnosis not present

## 2012-08-16 DIAGNOSIS — J984 Other disorders of lung: Secondary | ICD-10-CM | POA: Diagnosis not present

## 2012-08-16 LAB — BASIC METABOLIC PANEL
BUN: 18 mg/dL (ref 6–23)
CO2: 25 mEq/L (ref 19–32)
Chloride: 101 mEq/L (ref 96–112)
Creatinine, Ser: 0.93 mg/dL (ref 0.50–1.10)

## 2012-08-16 LAB — GLUCOSE, CAPILLARY
Glucose-Capillary: 132 mg/dL — ABNORMAL HIGH (ref 70–99)
Glucose-Capillary: 170 mg/dL — ABNORMAL HIGH (ref 70–99)
Glucose-Capillary: 263 mg/dL — ABNORMAL HIGH (ref 70–99)

## 2012-08-16 LAB — CBC
HCT: 32.4 % — ABNORMAL LOW (ref 36.0–46.0)
MCV: 77 fL — ABNORMAL LOW (ref 78.0–100.0)
RDW: 15.2 % (ref 11.5–15.5)
WBC: 19.2 10*3/uL — ABNORMAL HIGH (ref 4.0–10.5)

## 2012-08-16 MED ORDER — PNEUMOCOCCAL VAC POLYVALENT 25 MCG/0.5ML IJ INJ
0.5000 mL | INJECTION | INTRAMUSCULAR | Status: DC
  Filled 2012-08-16: qty 0.5

## 2012-08-16 MED ORDER — INSULIN ASPART 100 UNIT/ML ~~LOC~~ SOLN: 0.0000 [IU] | Freq: Every day | SUBCUTANEOUS | Status: DC

## 2012-08-16 MED ORDER — INSULIN ASPART 100 UNIT/ML ~~LOC~~ SOLN
0.0000 [IU] | Freq: Three times a day (TID) | SUBCUTANEOUS | Status: DC
  Administered 2012-08-16: 11 [IU] via SUBCUTANEOUS
  Administered 2012-08-17: 4 [IU] via SUBCUTANEOUS

## 2012-08-16 MED ORDER — INSULIN ASPART 100 UNIT/ML ~~LOC~~ SOLN
6.0000 [IU] | Freq: Three times a day (TID) | SUBCUTANEOUS | Status: DC
  Administered 2012-08-16 – 2012-08-17 (×2): 6 [IU] via SUBCUTANEOUS

## 2012-08-16 MED ORDER — INSULIN GLARGINE 100 UNIT/ML ~~LOC~~ SOLN
20.0000 [IU] | Freq: Every day | SUBCUTANEOUS | Status: DC
  Administered 2012-08-16: 20 [IU] via SUBCUTANEOUS
  Filled 2012-08-16 (×2): qty 0.2

## 2012-08-16 MED ORDER — GLIMEPIRIDE 4 MG PO TABS
4.0000 mg | ORAL_TABLET | Freq: Every day | ORAL | Status: DC
  Administered 2012-08-17: 4 mg via ORAL
  Filled 2012-08-16 (×2): qty 1

## 2012-08-16 MED ORDER — GLIPIZIDE ER 10 MG PO TB24
10.0000 mg | ORAL_TABLET | Freq: Two times a day (BID) | ORAL | Status: DC
  Administered 2012-08-16 – 2012-08-17 (×2): 10 mg via ORAL
  Filled 2012-08-16 (×5): qty 1

## 2012-08-16 NOTE — ED Provider Notes (Signed)
I have personally seen and examined the patient.  I have discussed the plan of care with the resident.  I have reviewed the documentation on PMH/FH/Soc. History.  I have reviewed the documentation of the resident and agree.    I was concerned patient would continue to deteriorate further and she reported that her tongue swelling was worsening despite medications given.  Anesthesia saw patient and agreed intubation would be safest course.  Pt stabilized in the ED.  Vent management done in conjunction with resp. Therapy.   CRITICAL CARE Performed by: Joya Gaskins Total critical care time: 35 Critical care time was exclusive of separately billable procedures and treating other patients. Critical care was necessary to treat or prevent imminent or life-threatening deterioration. Critical care was time spent personally by me on the following activities: development of treatment plan with patient and/or surrogate as well as nursing, discussions with consultants, evaluation of patient's response to treatment, examination of patient, obtaining history from patient or surrogate, ordering and performing treatments and interventions, ordering and review of laboratory studies, ordering and review of radiographic studies, pulse oximetry and re-evaluation of patient's condition.   Joya Gaskins, MD 08/16/12 [phone]

## 2012-08-16 NOTE — Progress Notes (Signed)
Report called to receiving nurse [id number], Ginger, RN. Past medical history and present hospitalization reported. All questions answered. All belongings transported with patient via wheelchair.

## 2012-08-16 NOTE — Progress Notes (Addendum)
PULMONARY  / CRITICAL CARE MEDICINE  Name: Cheyenne Alvarez MRN: [id number] DOB: [birth year]  3  ADMISSION DATE:  08/14/2012 CONSULTATION DATE:  08/14/2012  REFERRING MD :  Bebe Shaggy PRIMARY SERVICE: PCCM  CHIEF COMPLAINT:  Tongue swelling  BRIEF PATIENT DESCRIPTION: 47 y/o female with HTN on ramipril developed tongue swelling on 6/17 requiring intubation in the [facility] ED.    SIGNIFICANT EVENTS / STUDIES:  6/17 admission  LINES / TUBES: 6/17 oral ETT >> 6/18  SUBJECTIVE:  C/o right upper quadrant abdominal pain.  Also has sore throat.  Doesn't feel like she is ready to go home.  VITAL SIGNS: Temp:  [98 F (36.7 C)-98.6 F (37 C)] 98 F (36.7 C) (06/19 0429) Pulse Rate:  [75-96] 75 (06/19 0700) Resp:  [18-25] 19 (06/19 0700) BP: (103-157)/(50-137) 109/63 mmHg (06/19 0700) SpO2:  [88 %-100 %] 97 % (06/19 0700) FiO2 (%):  [40 %] 40 % (06/18 0800) Weight:  [245 lb 9.5 oz (111.4 kg)] 245 lb 9.5 oz (111.4 kg) (06/19 0500) VENTILATOR SETTINGS: Vent Mode:  [-] PSV;CPAP FiO2 (%):  [40 %] 40 % PEEP:  [5 cmH20] 5 cmH20 Pressure Support:  [10 cmH20] 10 cmH20 INTAKE / OUTPUT: Intake/Output     06/18 0701 - 06/19 0700 06/19 0701 - 06/20 0700   I.V. (mL/kg) 1079.4 (9.7)    Total Intake(mL/kg) 1079.4 (9.7)    Urine (mL/kg/hr) 226 (0.1)    Stool 1 (0)    Total Output 227     Net +852.4          Urine Occurrence 2 x    Stool Occurrence 1 x      PHYSICAL EXAMINATION: General: No distress Neuro: RASS 0, follows commands, normal strength HEENT: no oral lesions Cardiovascular: regular Lungs: no wheeze Abdomen: soft, mild tenderness RUQ and mid epigastrium, no guarding, no masses Musculoskeletal: no edema Skin:  No rashes  LABS:  Recent Labs Lab 08/14/12 1843 08/14/12 2009 08/14/12 2214 08/15/12 0425 08/15/12 1010 08/15/12 1607 08/16/12 0240  HGB  --  12.3  --  12.0  --   --  10.5*  WBC  --  20.4*  --  14.1*  --   --  19.2*  PLT  --  488*  --  475*  --   --  438*  NA  --   135  --  132* 134* 136 137  K  --  3.7  --  6.0* 4.4 3.9 3.5  CL  --  97  --  98 97 99 101  CO2  --  26  --  24 22 23 25   GLUCOSE  --  262*  --  292* 230* 190* 222*  BUN  --  11  --  18 18 18 18   CREATININE  --  0.94  --  1.56* 1.25* 1.13* 0.93  CALCIUM  --  9.4  --  9.7 9.6 9.3 8.8  PROCALCITON  --   --   --   --  0.19  --  0.11  PHART 7.236*  --  7.361  --   --   --   --   PCO2ART 57.9*  --  45.4*  --   --   --   --   PO2ART 100.0  --  117.0*  --   --   --   --     Recent Labs Lab 08/15/12 1126 08/15/12 1547 08/15/12 2023 08/15/12 2349 08/16/12 0428  GLUCAP 195* 177* 196* 263* 220*  Imaging: Dg Chest Port 1 View  08/15/2012   *RADIOLOGY REPORT*  Clinical Data: Evaluate endotracheal tube position.  PORTABLE CHEST - 1 VIEW  Comparison: Chest x-ray 08/14/2012.  Findings: An endotracheal tube is in place with tip 2.9 cm above the carina. A nasogastric tube is seen extending into the stomach, however, the tip of the nasogastric tube extends below the lower margin of the image.  Lung volumes are low.  Patchy multifocal interstitial and ill-defined airspace opacities throughout the left lung, concerning for developing multilobar bronchopneumonia.  Mild pulmonary venous congestion, without frank pulmonary edema. Possible small left pleural effusion.  Heart size is mildly enlarged.  Mediastinal contours are unremarkable.  IMPRESSION: 1.  Support apparatus, as above. 2.  Worsening aeration throughout the left lung, concerning for developing multilobar bronchopneumonia.  Possible small left pleural effusion. 3.  Mild cardiomegaly with pulmonary venous congestion.   Original Report Authenticated By: Trudie Reed, M.D.   Dg Chest Portable 1 View  08/14/2012   *RADIOLOGY REPORT*  Clinical Data: Intubation.  Pullback endotracheal tube.  PORTABLE CHEST - 1 VIEW  Comparison: 08/14/2012 at 1824 hours.  Findings:  Current radiograph at 1839 hours.  The endotracheal tube has been pulled back.  The  carina is difficult to discretely visualize, but the endotracheal tube is estimated to be 1.5 cm above the carina.  Lung volumes are low. There is interstitial prominence bilaterally which is likely accentuated by the low lung volumes.  Suspect left basilar airspace opacity.  IMPRESSION: 1.  Endotracheal tube is approximately 1.5 cm above the level of the carina. 2.  Interstitial prominence bilaterally and probable left basilar airspace disease. Question aspiration or pneumonia.  Congestive heart failure difficult to exclude.   Original Report Authenticated By: Britta Mccreedy, M.D.   Dg Chest Portable 1 View  08/14/2012   *RADIOLOGY REPORT*  Clinical Data: Intubated.  PORTABLE CHEST - 1 VIEW  Comparison: 08/24/2011.  Findings: Endotracheal tube tip in the right mainstem bronchus, 2.5 cm below the carina.  Mild diffuse left lung airspace opacity. Increased prominence of the interstitial markings, accentuated by a decreased inspiration.  Mild scoliosis.  IMPRESSION:  1.  Endotracheal tube tip in the right mainstem bronchus.  It is recommended that this be retracted 4 cm. 2.  Mild diffuse left lung airspace opacity, most likely due to atelectasis.  Early pneumonia is also a possibility. 3.  Increased prominence of the interstitial markings, at least partly due to a decreased depth of inspiration.  Developing interstitial pulmonary edema is also a possibility.  These  results  will  be  called  to  the  ordering  clinician   or representative  by  the  Radiologist Assistant,  and  communication documented in the PACS Dashboard.   Original Report Authenticated By: Beckie Salts, M.D.     ASSESSMENT / PLAN:  PULMONARY A: Angioedema presumably due to ACE inhibitor resulting in VDRF >> extubated 6/18. P:   -bronchial hygiene -f/u CXR  CARDIOVASCULAR A: Hypertension. P:  -continue atenelol -no more ACE -Inhibitors ever -continue ASA  RENAL A: Acute renal failure; FeNA 0.16/pre-renal >> resolved with IV  fluids 6/19. Hyperkalemia >> given kayexalate 6/18 >> resolved 6/19. P:   -f/u BMET -d/c IV fluids -monitor urine outpt  GASTROINTESTINAL A:  Nutrition. Non-specific abdominal pain. P:   -carb modified diet -stool guiac -abd u/s to assess pain symptoms -protonix for SUP  HEMATOLOGIC A: Leukocytosis >> likely from acute stress response. P:  -f/u CBC  INFECTIOUS A: Infiltrate on CXR, but no other evidence for pneumonia and procalcitonin negative. P:   -monitor off Abx  ENDOCRINE A:  DM2, high dose insulin at home (25 units 70/30 tid)   P:   -SSI with lantus -resume amaryl, glucotrol  NEUROLOGIC A: Hx of Bipolar, insomnia. P:   -continue klonopin, zyprexa, ambien, neurontin  Updated family at bedside.  Transfer to medical floor bed.  Keep on PCCM service since likely to d/c home in next 24 hours.  Coralyn Helling, MD [facility] Pulmonary/Critical Care 08/16/2012, 7:35 AM Pager:  [phone] After 3pm call: [phone]

## 2012-08-17 DIAGNOSIS — J9600 Acute respiratory failure, unspecified whether with hypoxia or hypercapnia: Secondary | ICD-10-CM | POA: Diagnosis not present

## 2012-08-17 DIAGNOSIS — Z5189 Encounter for other specified aftercare: Secondary | ICD-10-CM | POA: Diagnosis not present

## 2012-08-17 LAB — COMPREHENSIVE METABOLIC PANEL WITH GFR
ALT: 15 U/L (ref 0–35)
AST: 19 U/L (ref 0–37)
Albumin: 2.7 g/dL — ABNORMAL LOW (ref 3.5–5.2)
Alkaline Phosphatase: 114 U/L (ref 39–117)
BUN: 15 mg/dL (ref 6–23)
CO2: 25 meq/L (ref 19–32)
Calcium: 8.8 mg/dL (ref 8.4–10.5)
Chloride: 102 meq/L (ref 96–112)
Creatinine, Ser: 0.85 mg/dL (ref 0.50–1.10)
GFR calc Af Amer: >90 mL/min (ref >90)
GFR calc non Af Amer: 80 mL/min — ABNORMAL LOW (ref >90)
Glucose, Bld: 226 mg/dL — ABNORMAL HIGH (ref 70–99)
Potassium: 3.9 meq/L (ref 3.5–5.1)
Sodium: 137 meq/L (ref 135–145)
Total Bilirubin: 0.2 mg/dL — ABNORMAL LOW (ref 0.3–1.2)
Total Protein: 6.6 g/dL (ref 6.0–8.3)

## 2012-08-17 LAB — CBC
Hemoglobin: 10.7 g/dL — ABNORMAL LOW (ref 12.0–15.0)
MCHC: 32.3 g/dL (ref 30.0–36.0)
RDW: 15.3 % (ref 11.5–15.5)
WBC: 11.9 10*3/uL — ABNORMAL HIGH (ref 4.0–10.5)

## 2012-08-17 LAB — GLUCOSE, CAPILLARY: Glucose-Capillary: 177 mg/dL — ABNORMAL HIGH (ref 70–99)

## 2012-08-17 NOTE — Progress Notes (Signed)
Pt. discharged to floor,verbalized understanding of discharged instruction,medication,restriction,diet and follow up appointment.Baseline Vitals sign stable,Pt comfortable,no sign and symptom of distress. 

## 2012-08-17 NOTE — Care Management Note (Signed)
    Page 1 of 1   08/17/2012     11:47:39 AM   CARE MANAGEMENT NOTE 08/17/2012  Patient:  Cheyenne Alvarez, Cheyenne Alvarez   Account Number:  [id number]  Date Initiated:  08/16/2012  Documentation initiated by:  Avie Arenas  Subjective/Objective Assessment:   angioedema - intubated on admission - now extubated.     Action/Plan:   Anticipated DC Date:  08/17/2012   Anticipated DC Plan:  HOME/SELF CARE      DC Planning Services  CM consult      Choice offered to / List presented to:             Status of service:  Completed, signed off Medicare Important Message given?   (If response is "NO", the following Medicare IM given date fields will be blank) Date Medicare IM given:   Date Additional Medicare IM given:    Discharge Disposition:  HOME/SELF CARE  Per UR Regulation:  Reviewed for med. necessity/level of care/duration of stay  If discussed at Long Length of Stay Meetings, dates discussed:    Comments:  08/17/12 11:45 Letha Cape RN, BSN [phone] patient lives with family, pta indep.  No NCM referral, patient dc to home.

## 2012-08-17 NOTE — Discharge Summary (Signed)
Physician Discharge Summary  Patient ID: OASIS GOEHRING MRN: [id number] DOB/AGE: [birth year] 47 y.o.  Admit date: 08/14/2012 Discharge date: 08/17/2012  Problem List Principal Problem:   ACE inhibitor-aggravated angioedema Active Problems:   DIABETES MELLITUS   HYPERTENSION, BENIGN ESSENTIAL   Acute respiratory failure with hypoxia  HPI: 47 y/o female with HTN on ramipril developed tongue swelling on 6/17 requiring intubation in the [facility] ED. She noted the acute onset of tongue swelling on 6/17 after being on ramipril for several years. She was intubated for airway protection. No further history could be provided because the patient was intubated on my exam.  Hospital Course: SIGNIFICANT EVENTS / STUDIES:  6/17 admission  LINES / TUBES:  6/17 oral ETT >> 6/18   ASSESSMENT / PLAN:  PULMONARY  A: Angioedema presumably due to ACE inhibitor resulting in VDRF >> extubated 6/18.  P:  -bronchial hygiene  -f/u CXR  CARDIOVASCULAR  A: Hypertension.  P:  -continue atenelol  -no more ACE -Inhibitors ever  -continue ASA  RENAL  A: Acute renal failure; FeNA 0.16/pre-renal >> resolved with IV fluids 6/19.  Hyperkalemia >> given kayexalate 6/18 >> resolved 6/19.  P:  -f/u BMET  -d/c IV fluids  -monitor urine outpt  GASTROINTESTINAL  A: Nutrition.  Non-specific abdominal pain.  P:  -carb modified diet  -stool guiac  -abd u/s to assess pain symptoms  -protonix for SUP  HEMATOLOGIC  A: Leukocytosis >> likely from acute stress response.  P:  -f/u CBC  INFECTIOUS  A: Infiltrate on CXR, but no other evidence for pneumonia and procalcitonin negative.  P:  -monitor off Abx  ENDOCRINE  A: DM2, high dose insulin at home (25 units 70/30 tid)  P:  -SSI with lantus  -resume amaryl, glucotrol  NEUROLOGIC  A: Hx of Bipolar, insomnia.  P:  -continue klonopin, zyprexa, ambien, neurontin         Labs at discharge Lab Results  Component Value Date   CREATININE 0.85 08/17/2012    BUN 15 08/17/2012   NA 137 08/17/2012   K 3.9 08/17/2012   CL 102 08/17/2012   CO2 25 08/17/2012   Lab Results  Component Value Date   WBC 11.9* 08/17/2012   HGB 10.7* 08/17/2012   HCT 33.1* 08/17/2012   MCV 77.5* 08/17/2012   PLT 428* 08/17/2012   Lab Results  Component Value Date   ALT 15 08/17/2012   AST 19 08/17/2012   ALKPHOS 114 08/17/2012   BILITOT 0.2* 08/17/2012   Lab Results  Component Value Date   INR 1.07 06/14/2010   INR 1.22 03/25/2009    Current radiology studies US Abdomen Complete  08/16/2012   *RADIOLOGY REPORT*  Clinical Data:  Abdominal pain  ABDOMINAL ULTRASOUND COMPLETE  Comparison:  None.  Findings:  Gallbladder:  Absent.  Common Bile Duct:  Within normal limits in caliber.  Liver: There is increased echogenicity throughout the liver.  The parenchyma attenuates the ultrasound beam.  No focal mass.  IVC:  Appears normal.  Pancreas:  No abnormality identified.  Spleen:  Within normal limits in size and echotexture.  Right kidney:  Normal in size and parenchymal echogenicity.  No evidence of mass or hydronephrosis.  Left kidney:  Normal in size and parenchymal echogenicity.  No evidence of mass or hydronephrosis. 1.1 cm simple cyst in the interpolar region.  Abdominal Aorta:  Portions of the aorta were obscured by overlying bowel gas.  IMPRESSION: Portions of the aorta were obscured.  Probable diffuse hepatic  steatosis.  Gallbladder is absent consistent with the given history of cholecystectomy.   Original Report Authenticated By: Jolaine Click, M.D.   Dg Chest Port 1 View  08/16/2012   *RADIOLOGY REPORT*  Clinical Data: Respiratory failure.  PORTABLE CHEST - 1 VIEW  Comparison: 08/15/2012  Findings: The patient has been extubated.  Nasogastric tube has also been removed.  Lungs show persistent low volumes with suggestion of some residual mild airspace disease in the left lung. No overt airspace edema or pleural fluid is identified.  There is stable mild cardiomegaly.  IMPRESSION:  Low volumes with persistent mild left lung airspace disease.   Original Report Authenticated By: Irish Lack, M.D.    Disposition:  02-Short Term Hospital  Discharge Orders   Future Orders Complete By Expires     Discharge patient  As directed         Medication List    STOP taking these medications       ramipril 10 MG capsule  Commonly known as:  ALTACE      TAKE these medications       albuterol 108 (90 BASE) MCG/ACT inhaler  Commonly known as:  PROVENTIL HFA;VENTOLIN HFA  Inhale 2 puffs into the lungs every 6 (six) hours as needed for wheezing. For wheezing     aspirin EC 81 MG tablet  Take 81 mg by mouth every morning.     atenolol 25 MG tablet  Commonly known as:  TENORMIN  Take 25 mg by mouth 2 (two) times daily.     clonazePAM 0.5 MG tablet  Commonly known as:  KLONOPIN  Take 0.5 mg by mouth 3 (three) times daily.     diphenhydrAMINE 25 mg capsule  Commonly known as:  BENADRYL  Take 50 mg by mouth at bedtime.     furosemide 20 MG tablet  Commonly known as:  LASIX  Take 20 mg by mouth 2 (two) times daily.     gabapentin 300 MG capsule  Commonly known as:  NEURONTIN  Take 300 mg by mouth 3 (three) times daily.     glimepiride 4 MG tablet  Commonly known as:  AMARYL  Take 4 mg by mouth daily before breakfast.     glipiZIDE 10 MG 24 hr tablet  Commonly known as:  GLUCOTROL XL  Take 10 mg by mouth 2 (two) times daily.     hydrOXYzine 50 MG capsule  Commonly known as:  VISTARIL  Take 100 mg by mouth 3 (three) times daily.     insulin NPH-regular (70-30) 100 UNIT/ML injection  Commonly known as:  NOVOLIN 70/30  Inject 25 Units into the skin 3 (three) times daily.     OLANZapine 20 MG tablet  Commonly known as:  ZYPREXA  Take 20 mg by mouth at bedtime.     pantoprazole 40 MG tablet  Commonly known as:  PROTONIX  Take 40 mg by mouth daily.     zolpidem 10 MG tablet  Commonly known as:  AMBIEN  Take 10 mg by mouth at bedtime.           Discharged Condition: good  Time spent on discharge greater than 40 minutes.  Vital signs at Discharge. Temp:  [97.8 F (36.6 C)-98.3 F (36.8 C)] 97.8 F (36.6 C) (06/20 0531) Pulse Rate:  [71-83] 73 (06/20 0829) Resp:  [18-20] 20 (06/20 0531) BP: (94-164)/(51-90) 136/84 mmHg (06/20 0829) SpO2:  [96 %-100 %] 97 % (06/20 0531) Weight:  [108.999 kg (240 lb 4.8  oz)] 108.999 kg (240 lb 4.8 oz) (06/20 0500) Office follow up Special Information or instructions. Follow one time with NP Parret ANP-C and then with her cardiologist Dr. Sharyn Lull. Signed: Brett Canales Minor ACNP Adolph Pollack PCCM Pager [phone] till 3 pm If no answer page [phone] 08/17/2012, 10:10 AM     Pt seen and examined with S Minor and I agree with the above note. Pt to f/u with NP Parrett, Cardiology and also will be establishing with dr Parke [facility] clinic July 11. Dorcas Carrow Beeper  [phone]  Cell  [phone]  If no response or cell goes to voicemail, call beeper [phone]

## 2012-08-20 DIAGNOSIS — F429 Obsessive-compulsive disorder, unspecified: Secondary | ICD-10-CM | POA: Diagnosis not present

## 2012-08-20 DIAGNOSIS — F259 Schizoaffective disorder, unspecified: Secondary | ICD-10-CM | POA: Diagnosis not present

## 2012-09-04 DIAGNOSIS — E7800 Pure hypercholesterolemia, unspecified: Secondary | ICD-10-CM | POA: Diagnosis not present

## 2012-09-04 DIAGNOSIS — I10 Essential (primary) hypertension: Secondary | ICD-10-CM | POA: Diagnosis not present

## 2012-09-04 DIAGNOSIS — E119 Type 2 diabetes mellitus without complications: Secondary | ICD-10-CM | POA: Diagnosis not present

## 2012-09-06 ENCOUNTER — Inpatient Hospital Stay: Payer: Medicare Other | Admitting: Adult Health

## 2012-09-06 DIAGNOSIS — E7800 Pure hypercholesterolemia, unspecified: Secondary | ICD-10-CM | POA: Diagnosis not present

## 2012-09-06 DIAGNOSIS — I2510 Atherosclerotic heart disease of native coronary artery without angina pectoris: Secondary | ICD-10-CM | POA: Diagnosis not present

## 2012-09-06 DIAGNOSIS — E119 Type 2 diabetes mellitus without complications: Secondary | ICD-10-CM | POA: Diagnosis not present

## 2012-09-06 DIAGNOSIS — I10 Essential (primary) hypertension: Secondary | ICD-10-CM | POA: Diagnosis not present

## 2012-09-07 DIAGNOSIS — F2089 Other schizophrenia: Secondary | ICD-10-CM | POA: Diagnosis not present

## 2012-09-07 DIAGNOSIS — E7800 Pure hypercholesterolemia, unspecified: Secondary | ICD-10-CM | POA: Diagnosis not present

## 2012-09-07 DIAGNOSIS — E119 Type 2 diabetes mellitus without complications: Secondary | ICD-10-CM | POA: Diagnosis not present

## 2012-09-07 DIAGNOSIS — F319 Bipolar disorder, unspecified: Secondary | ICD-10-CM | POA: Diagnosis not present

## 2012-09-07 DIAGNOSIS — I10 Essential (primary) hypertension: Secondary | ICD-10-CM | POA: Diagnosis not present

## 2012-09-11 DIAGNOSIS — E119 Type 2 diabetes mellitus without complications: Secondary | ICD-10-CM | POA: Diagnosis not present

## 2012-09-17 DIAGNOSIS — R0602 Shortness of breath: Secondary | ICD-10-CM | POA: Diagnosis not present

## 2012-09-28 DIAGNOSIS — E119 Type 2 diabetes mellitus without complications: Secondary | ICD-10-CM | POA: Diagnosis not present

## 2012-09-28 DIAGNOSIS — F329 Major depressive disorder, single episode, unspecified: Secondary | ICD-10-CM | POA: Diagnosis not present

## 2012-09-28 DIAGNOSIS — I10 Essential (primary) hypertension: Secondary | ICD-10-CM | POA: Diagnosis not present

## 2012-09-28 DIAGNOSIS — E7800 Pure hypercholesterolemia, unspecified: Secondary | ICD-10-CM | POA: Diagnosis not present

## 2012-09-28 DIAGNOSIS — F411 Generalized anxiety disorder: Secondary | ICD-10-CM | POA: Diagnosis not present

## 2012-11-06 DIAGNOSIS — IMO0001 Reserved for inherently not codable concepts without codable children: Secondary | ICD-10-CM | POA: Diagnosis not present

## 2012-11-06 DIAGNOSIS — E7800 Pure hypercholesterolemia, unspecified: Secondary | ICD-10-CM | POA: Diagnosis not present

## 2012-11-06 DIAGNOSIS — I2510 Atherosclerotic heart disease of native coronary artery without angina pectoris: Secondary | ICD-10-CM | POA: Diagnosis not present

## 2012-11-06 DIAGNOSIS — R0789 Other chest pain: Secondary | ICD-10-CM | POA: Diagnosis not present

## 2012-11-06 DIAGNOSIS — I10 Essential (primary) hypertension: Secondary | ICD-10-CM | POA: Diagnosis not present

## 2012-11-06 IMAGING — CR DG CHEST 2V
2 series · 2 of 2 positions shown · non-contrast
Comparison: 07/29/2011..

CLINICAL DATA: Shortness of breath.  Chest tightness starting
today. Chest pain. History of diabetes and hypertension.  History
of COPD and CHF.  Ex-smoker.

CHEST - 2 VIEW

[w chest pa]
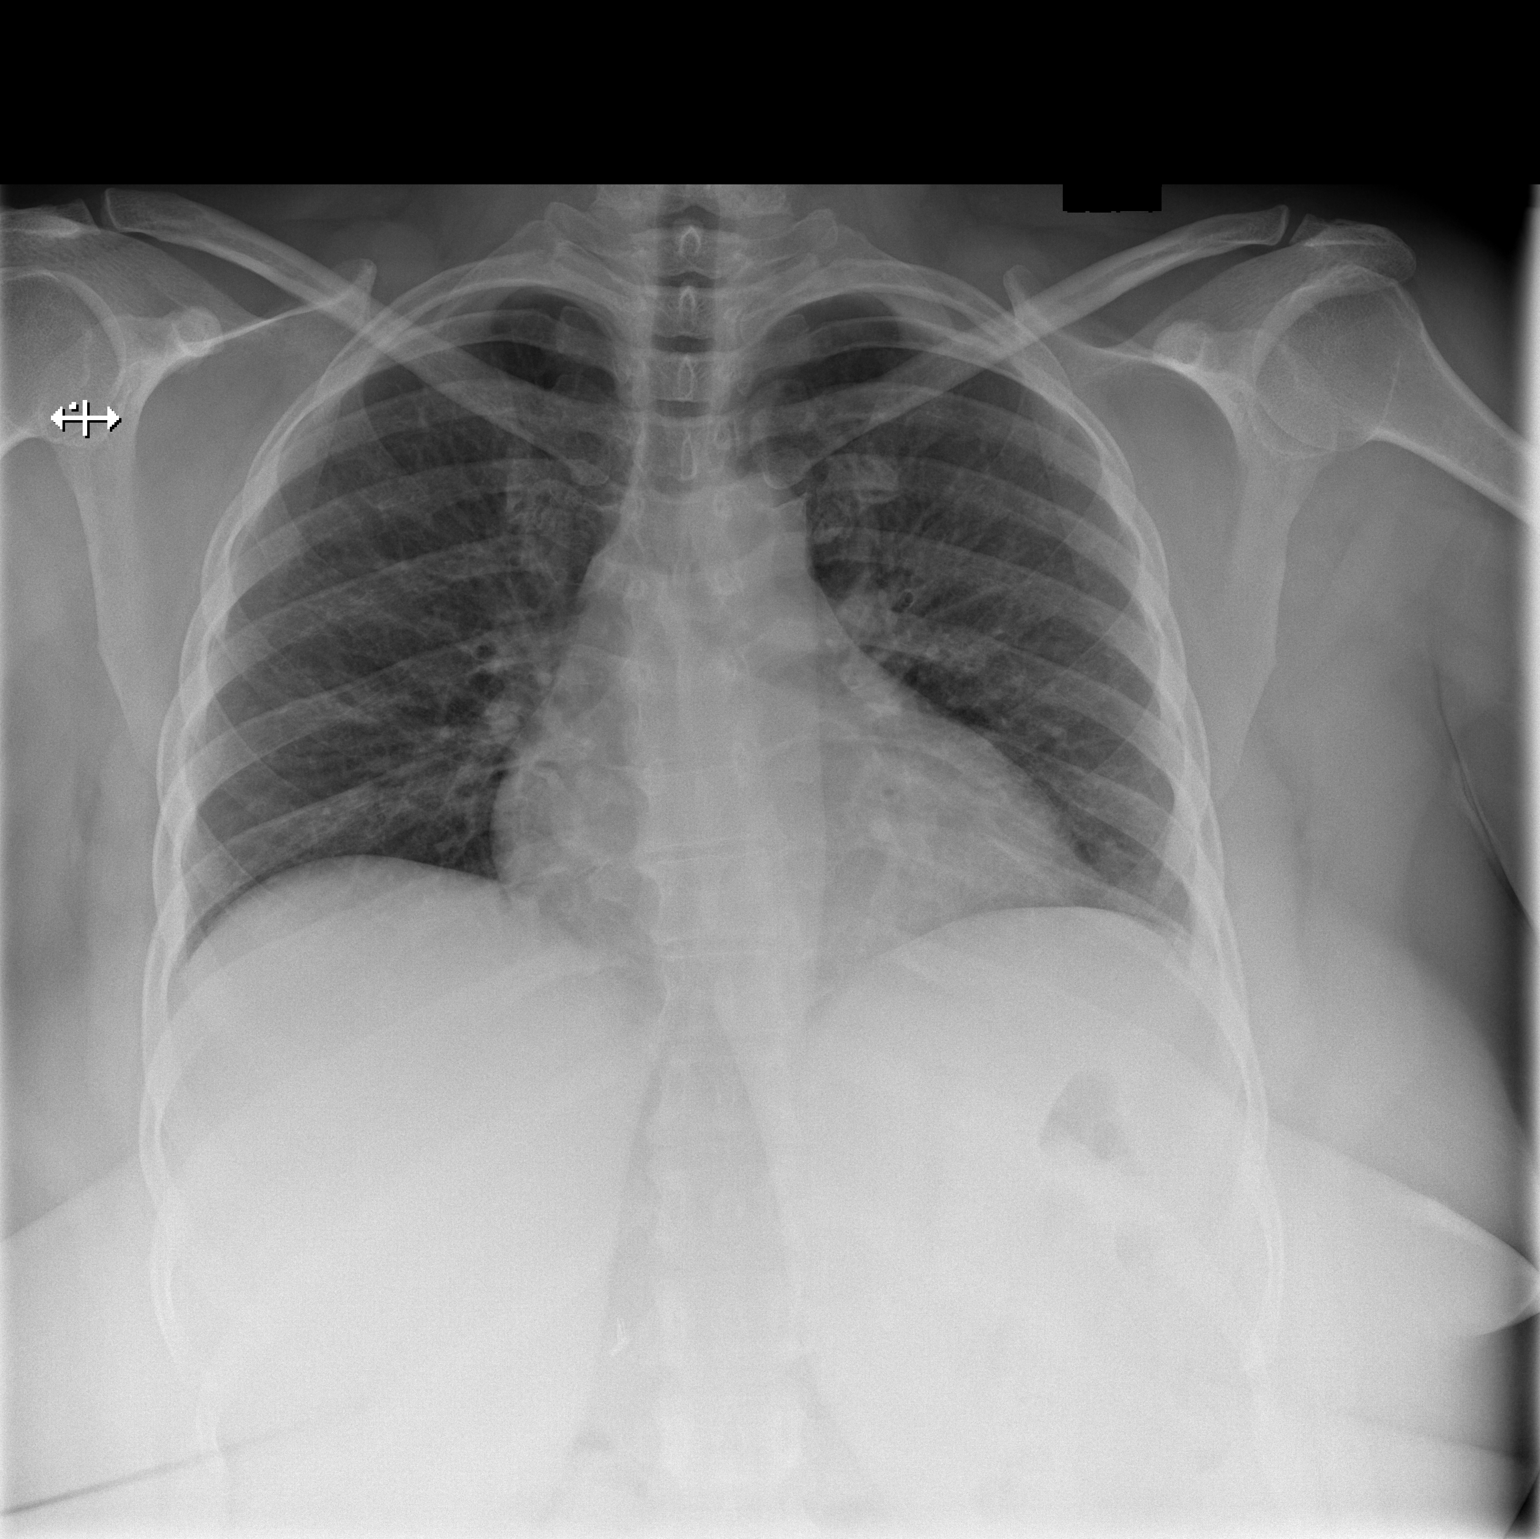

[w chest lat]
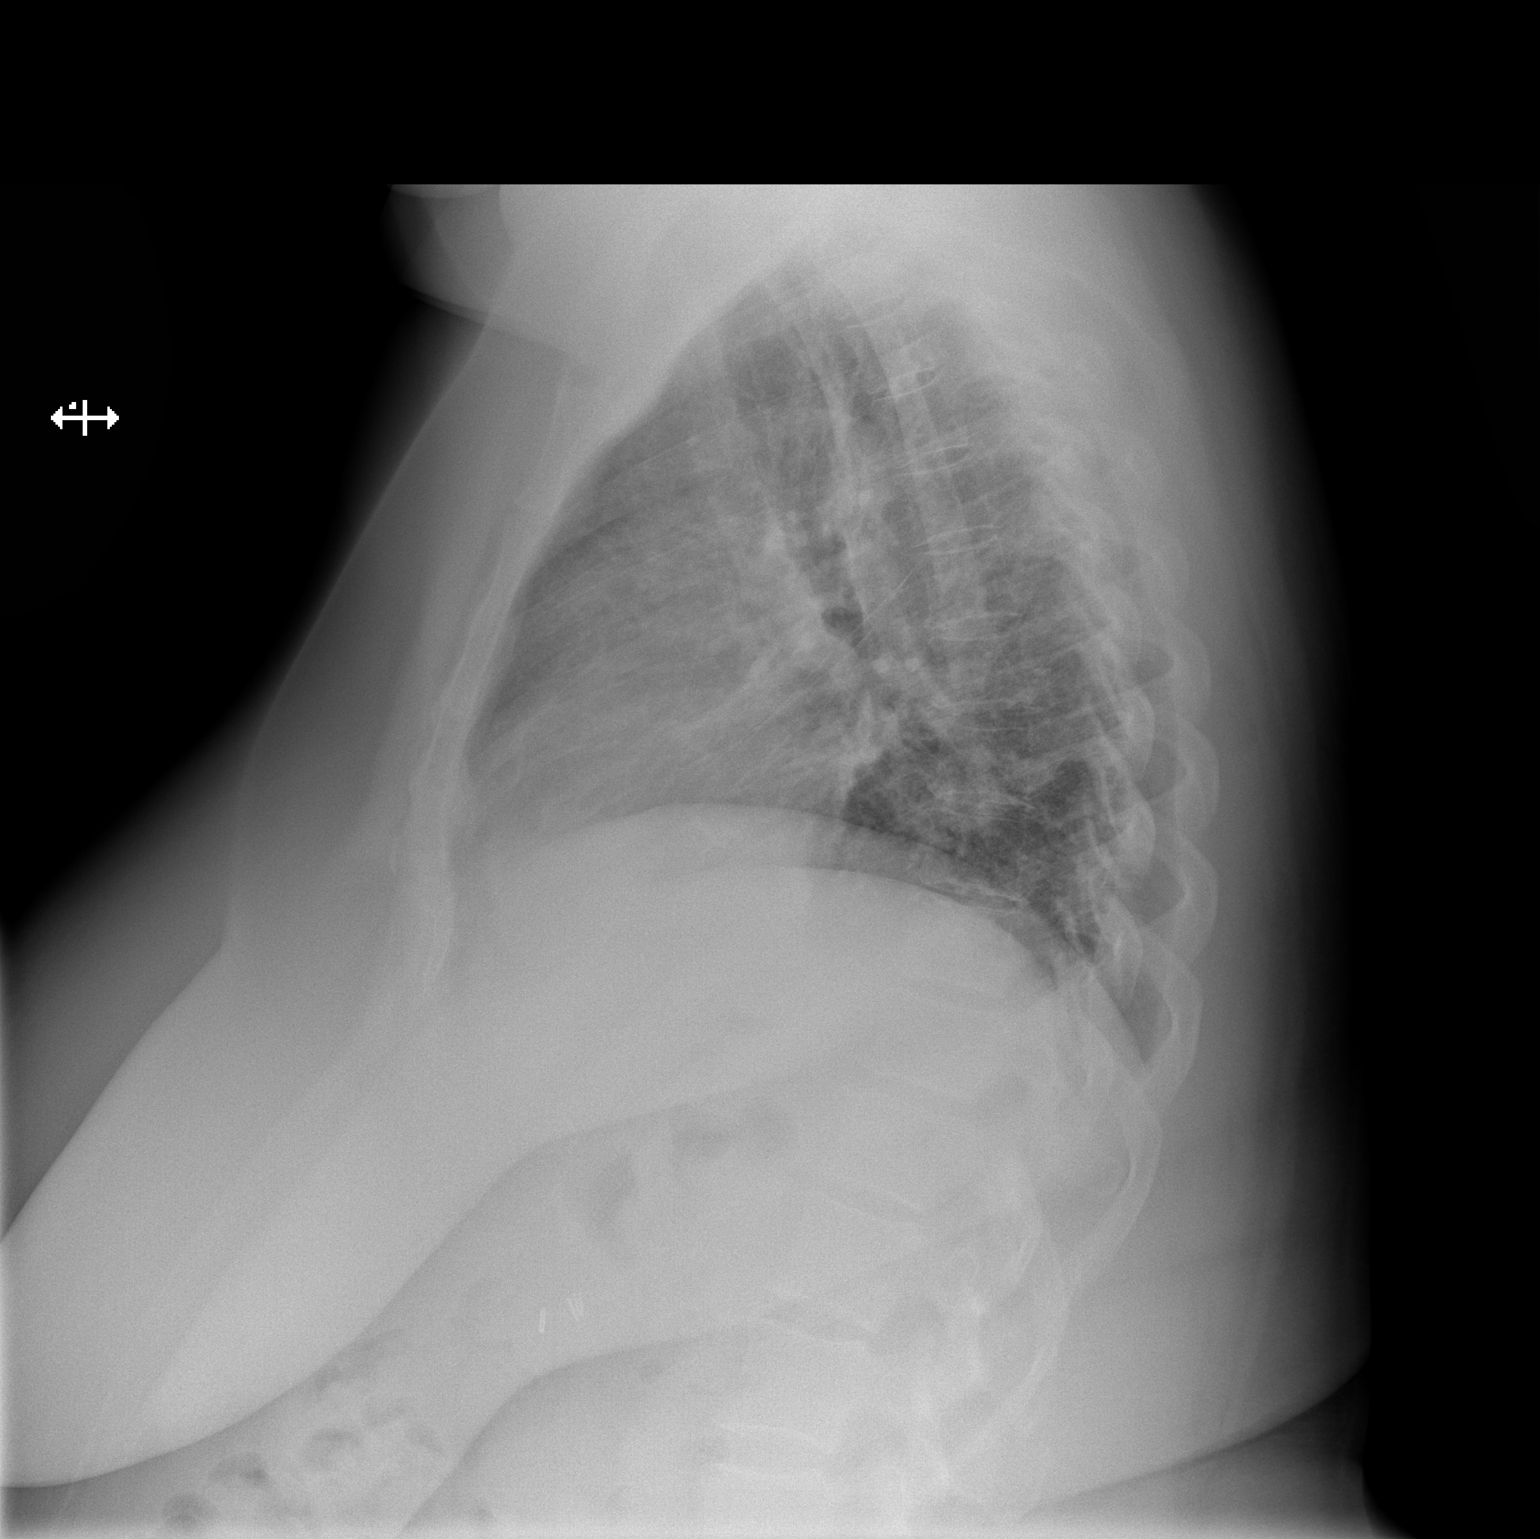

[2 of 2 positions shown; findings below may reference images not displayed]

FINDINGS: There is moderate enlargement cardiac silhouette.
Mediastinal and hilar contours appear stable.  No acute pulmonary
infiltrates present.  No nodules were evident.  There are low lung
volumes. Slight nonspecific increase in reticular interstitial
markings.  No consolidation.  There is some posterior atelectasis
on the lateral image.  There is minimal degenerative spondylosis.
IMPRESSION: Moderate enlargement of the cardiac silhouette.  Minimal posterior
atelectasis on lateral image.  No pulmonary edema, pneumonia, or
pleural effusion.

## 2012-11-09 DIAGNOSIS — I10 Essential (primary) hypertension: Secondary | ICD-10-CM | POA: Diagnosis not present

## 2012-11-09 DIAGNOSIS — E119 Type 2 diabetes mellitus without complications: Secondary | ICD-10-CM | POA: Diagnosis not present

## 2012-11-14 DIAGNOSIS — F429 Obsessive-compulsive disorder, unspecified: Secondary | ICD-10-CM | POA: Diagnosis not present

## 2012-12-17 DIAGNOSIS — I10 Essential (primary) hypertension: Secondary | ICD-10-CM | POA: Diagnosis not present

## 2012-12-17 DIAGNOSIS — E119 Type 2 diabetes mellitus without complications: Secondary | ICD-10-CM | POA: Diagnosis not present

## 2013-01-22 DIAGNOSIS — F259 Schizoaffective disorder, unspecified: Secondary | ICD-10-CM | POA: Diagnosis not present

## 2013-01-22 DIAGNOSIS — F429 Obsessive-compulsive disorder, unspecified: Secondary | ICD-10-CM | POA: Diagnosis not present

## 2013-01-23 DIAGNOSIS — I10 Essential (primary) hypertension: Secondary | ICD-10-CM | POA: Diagnosis not present

## 2013-01-23 DIAGNOSIS — K219 Gastro-esophageal reflux disease without esophagitis: Secondary | ICD-10-CM | POA: Diagnosis not present

## 2013-01-23 DIAGNOSIS — E119 Type 2 diabetes mellitus without complications: Secondary | ICD-10-CM | POA: Diagnosis not present

## 2013-02-11 DIAGNOSIS — I10 Essential (primary) hypertension: Secondary | ICD-10-CM | POA: Diagnosis not present

## 2013-02-11 DIAGNOSIS — E119 Type 2 diabetes mellitus without complications: Secondary | ICD-10-CM | POA: Diagnosis not present

## 2013-02-11 DIAGNOSIS — E7800 Pure hypercholesterolemia, unspecified: Secondary | ICD-10-CM | POA: Diagnosis not present

## 2013-02-19 ENCOUNTER — Inpatient Hospital Stay (HOSPITAL_COMMUNITY)
Admission: EM | Admit: 2013-02-19 | Discharge: 2013-02-21 | DRG: 193 | Disposition: A | Discharge status: Home / Self Care | Payer: Medicare Other | Attending: Family Medicine | Admitting: Family Medicine

## 2013-02-19 ENCOUNTER — Emergency Department (HOSPITAL_COMMUNITY): Payer: Medicare Other

## 2013-02-19 ENCOUNTER — Encounter (HOSPITAL_COMMUNITY): Payer: Self-pay | Admitting: Emergency Medicine

## 2013-02-19 DIAGNOSIS — E669 Obesity, unspecified: Secondary | ICD-10-CM | POA: Diagnosis not present

## 2013-02-19 DIAGNOSIS — E119 Type 2 diabetes mellitus without complications: Secondary | ICD-10-CM | POA: Diagnosis present

## 2013-02-19 DIAGNOSIS — R252 Cramp and spasm: Secondary | ICD-10-CM

## 2013-02-19 DIAGNOSIS — Z9089 Acquired absence of other organs: Secondary | ICD-10-CM

## 2013-02-19 DIAGNOSIS — F319 Bipolar disorder, unspecified: Secondary | ICD-10-CM | POA: Diagnosis present

## 2013-02-19 DIAGNOSIS — B370 Candidal stomatitis: Secondary | ICD-10-CM

## 2013-02-19 DIAGNOSIS — J9600 Acute respiratory failure, unspecified whether with hypoxia or hypercapnia: Secondary | ICD-10-CM | POA: Diagnosis present

## 2013-02-19 DIAGNOSIS — D649 Anemia, unspecified: Secondary | ICD-10-CM

## 2013-02-19 DIAGNOSIS — F411 Generalized anxiety disorder: Secondary | ICD-10-CM

## 2013-02-19 DIAGNOSIS — J9601 Acute respiratory failure with hypoxia: Secondary | ICD-10-CM | POA: Diagnosis present

## 2013-02-19 DIAGNOSIS — F4310 Post-traumatic stress disorder, unspecified: Secondary | ICD-10-CM

## 2013-02-19 DIAGNOSIS — F17200 Nicotine dependence, unspecified, uncomplicated: Secondary | ICD-10-CM | POA: Diagnosis present

## 2013-02-19 DIAGNOSIS — Z794 Long term (current) use of insulin: Secondary | ICD-10-CM

## 2013-02-19 DIAGNOSIS — I509 Heart failure, unspecified: Secondary | ICD-10-CM | POA: Diagnosis present

## 2013-02-19 DIAGNOSIS — K219 Gastro-esophageal reflux disease without esophagitis: Secondary | ICD-10-CM | POA: Diagnosis present

## 2013-02-19 DIAGNOSIS — B349 Viral infection, unspecified: Secondary | ICD-10-CM | POA: Diagnosis present

## 2013-02-19 DIAGNOSIS — I10 Essential (primary) hypertension: Secondary | ICD-10-CM | POA: Diagnosis present

## 2013-02-19 DIAGNOSIS — J189 Pneumonia, unspecified organism: Principal | ICD-10-CM | POA: Diagnosis present

## 2013-02-19 DIAGNOSIS — J40 Bronchitis, not specified as acute or chronic: Secondary | ICD-10-CM | POA: Diagnosis not present

## 2013-02-19 DIAGNOSIS — B9789 Other viral agents as the cause of diseases classified elsewhere: Secondary | ICD-10-CM | POA: Diagnosis present

## 2013-02-19 DIAGNOSIS — R3129 Other microscopic hematuria: Secondary | ICD-10-CM

## 2013-02-19 DIAGNOSIS — Z7982 Long term (current) use of aspirin: Secondary | ICD-10-CM | POA: Diagnosis not present

## 2013-02-19 DIAGNOSIS — R0602 Shortness of breath: Secondary | ICD-10-CM | POA: Diagnosis not present

## 2013-02-19 DIAGNOSIS — R079 Chest pain, unspecified: Secondary | ICD-10-CM | POA: Diagnosis not present

## 2013-02-19 DIAGNOSIS — E8941 Symptomatic postprocedural ovarian failure: Secondary | ICD-10-CM

## 2013-02-19 LAB — CBC WITH DIFFERENTIAL/PLATELET
Eosinophils Relative: 1 % (ref 0–5)
HCT: 34.3 % — ABNORMAL LOW (ref 36.0–46.0)
Lymphocytes Relative: 12 % (ref 12–46)
Lymphs Abs: 2.5 10*3/uL (ref 0.7–4.0)
MCV: 74.6 fL — ABNORMAL LOW (ref 78.0–100.0)
Monocytes Absolute: 1.2 10*3/uL — ABNORMAL HIGH (ref 0.1–1.0)
Monocytes Relative: 6 % (ref 3–12)
RBC: 4.6 MIL/uL (ref 3.87–5.11)
WBC: 20.3 10*3/uL — ABNORMAL HIGH (ref 4.0–10.5)

## 2013-02-19 LAB — COMPREHENSIVE METABOLIC PANEL
ALT: 10 U/L (ref 0–35)
CO2: 21 mEq/L (ref 19–32)
Calcium: 9.4 mg/dL (ref 8.4–10.5)
Creatinine, Ser: 0.87 mg/dL (ref 0.50–1.10)
GFR calc Af Amer: 90 mL/min — ABNORMAL LOW (ref >90)
GFR calc non Af Amer: 78 mL/min — ABNORMAL LOW (ref >90)
Glucose, Bld: 178 mg/dL — ABNORMAL HIGH (ref 70–99)

## 2013-02-19 LAB — POCT I-STAT TROPONIN I

## 2013-02-19 MED ORDER — ALBUTEROL SULFATE HFA 108 (90 BASE) MCG/ACT IN AERS
4.0000 | INHALATION_SPRAY | Freq: Once | RESPIRATORY_TRACT | Status: AC
  Administered 2013-02-19: 4 via RESPIRATORY_TRACT
  Filled 2013-02-19: qty 6.7

## 2013-02-19 MED ORDER — DEXTROSE 5 % IV SOLN: 500.0000 mg | Freq: Once | INTRAVENOUS | Status: DC

## 2013-02-19 MED ORDER — AZITHROMYCIN 250 MG PO TABS: 250.0000 mg | ORAL_TABLET | Freq: Every day | ORAL | Status: DC

## 2013-02-19 MED ORDER — AZITHROMYCIN 250 MG PO TABS
500.0000 mg | ORAL_TABLET | Freq: Once | ORAL | Status: AC
  Administered 2013-02-19: 500 mg via ORAL
  Filled 2013-02-19: qty 2

## 2013-02-19 NOTE — ED Provider Notes (Signed)
CSN: [id number]     Arrival date & time 02/19/13  1956 History   First MD Initiated Contact with Patient 02/19/13 2213     Chief Complaint  Patient presents with  . Shortness of Breath   (Consider location/radiation/quality/duration/timing/severity/associated sxs/prior Treatment) Patient is a 47 y.o. female presenting with shortness of breath. The history is provided by the patient and a relative.  Shortness of Breath Severity:  Moderate Onset quality:  Gradual Duration:  5 days Timing:  Constant Progression:  Worsening Chronicity:  New Context: URI (started 5 days ago, improved, then worsened again)   Relieved by:  Nothing Associated symptoms: chest pain, cough and fever   Associated symptoms: no abdominal pain, no headaches, no rash, no sore throat and no vomiting   Risk factors comment:  Hx of CHF   Past Medical History  Diagnosis Date  . Anxiety   . GERD (gastroesophageal reflux disease)   . Diabetes mellitus   . Hypertension   . Bipolar 1 disorder    Past Surgical History  Procedure Laterality Date  . Abdominal hysterectomy    . Leg surgery    . Cholecystectomy    . Tubal ligation     No family history on file. History  Substance Use Topics  . Smoking status: Never Smoker   . Smokeless tobacco: Not on file  . Alcohol Use: No   OB History   Grav Para Term Preterm Abortions TAB SAB Ect Mult Living                 Review of Systems  Constitutional: Positive for fever. Negative for chills.  HENT: Negative for sore throat.   Eyes: Negative for pain.  Respiratory: Positive for cough and shortness of breath.   Cardiovascular: Positive for chest pain.  Gastrointestinal: Negative for nausea, vomiting, abdominal pain and diarrhea.  Genitourinary: Negative for dysuria.  Musculoskeletal: Negative for back pain.  Skin: Negative for rash.  Neurological: Negative for numbness and headaches.    Allergies  Famotidine; Meperidine hcl; Ramipril; and Iohexol  Home  Medications   Current Outpatient Rx  Name  Route  Sig  Dispense  Refill  . albuterol (PROVENTIL HFA;VENTOLIN HFA) 108 (90 BASE) MCG/ACT inhaler   Inhalation   Inhale 2 puffs into the lungs every 6 (six) hours as needed for wheezing. For wheezing         . aspirin EC 81 MG tablet   Oral   Take 81 mg by mouth every morning.         Marland Kitchen atenolol (TENORMIN) 25 MG tablet   Oral   Take 25 mg by mouth 2 (two) times daily.         . clonazePAM (KLONOPIN) 0.5 MG tablet   Oral   Take 0.5 mg by mouth 3 (three) times daily as needed for anxiety.          . diphenhydrAMINE (BENADRYL) 25 mg capsule   Oral   Take 50 mg by mouth at bedtime.         . furosemide (LASIX) 20 MG tablet   Oral   Take 20 mg by mouth 2 (two) times daily.          Marland Kitchen gabapentin (NEURONTIN) 300 MG capsule   Oral   Take 300 mg by mouth 3 (three) times daily.          Marland Kitchen glimepiride (AMARYL) 4 MG tablet   Oral   Take 4 mg by mouth daily before breakfast.         .  glipiZIDE (GLUCOTROL XL) 10 MG 24 hr tablet   Oral   Take 10 mg by mouth 2 (two) times daily.         . hydrOXYzine (VISTARIL) 50 MG capsule   Oral   Take 100 mg by mouth 3 (three) times daily.         . insulin NPH-regular (NOVOLIN 70/30) (70-30) 100 UNIT/ML injection   Subcutaneous   Inject 25 Units into the skin 3 (three) times daily.         . pantoprazole (PROTONIX) 40 MG tablet   Oral   Take 40 mg by mouth daily.         Marland Kitchen zolpidem (AMBIEN) 10 MG tablet   Oral   Take 10 mg by mouth at bedtime.          Marland Kitchen azithromycin (ZITHROMAX) 250 MG tablet   Oral   Take 1 tablet (250 mg total) by mouth daily.   6 tablet   0    BP 122/74  Pulse 114  Temp(Src) 98.8 F (37.1 C) (Oral)  Resp 17  SpO2 99% Physical Exam  Constitutional: She is oriented to person, place, and time. She appears well-developed and well-nourished. No distress.  HENT:  Head: Normocephalic and atraumatic.  Eyes: Pupils are equal, round, and  reactive to light. Right eye exhibits no discharge. Left eye exhibits no discharge.  Neck: Normal range of motion.  Cardiovascular: Regular rhythm and normal heart sounds.  Tachycardia present.   Pulmonary/Chest: Tachypnea noted. She has decreased breath sounds.  Abdominal: Soft. She exhibits no distension. There is no tenderness.  Musculoskeletal: Normal range of motion.  Neurological: She is alert and oriented to person, place, and time.  Skin: Skin is warm. She is not diaphoretic.    ED Course  Procedures (including critical care time) Labs Review Labs Reviewed  CBC WITH DIFFERENTIAL - Abnormal; Notable for the following:    WBC 20.3 (*)    HCT 34.3 (*)    MCV 74.6 (*)    Neutrophils Relative % 80 (*)    Neutro Abs 16.3 (*)    Monocytes Absolute 1.2 (*)    All other components within normal limits  COMPREHENSIVE METABOLIC PANEL - Abnormal; Notable for the following:    Sodium 132 (*)    Chloride 94 (*)    Glucose, Bld 178 (*)    Total Protein 8.5 (*)    Albumin 3.2 (*)    Alkaline Phosphatase 128 (*)    GFR calc non Af Amer 78 (*)    GFR calc Af Amer 90 (*)    All other components within normal limits  PRO B NATRIURETIC PEPTIDE  POCT I-STAT TROPONIN I   Imaging Review Dg Chest 2 View  02/19/2013   CLINICAL DATA:  Shortness of breath.  EXAM: CHEST  2 VIEW  COMPARISON:  Previous examinations, the most recent dated 08/16/2012.  FINDINGS: Patchy airspace opacity in both lungs. Diffuse peribronchial thickening. Borderline enlarged cardiac silhouette. Poor inspiration. Mild scoliosis. No pleural fluid. Cholecystectomy clips.  IMPRESSION: 1. Patchy bilateral pneumonia or alveolar edema. 2. Moderate bronchitic changes.   Electronically Signed   By: Gordan Payment M.D.   On: 02/19/2013 20:49    EKG Interpretation   None       MDM   1. CAP (community acquired pneumonia)    47 yo F with sig PMHx of CHF, DM, HTN, who presents for shortness of breath.   Concern for PNA, viral  URI, CHF  exacerbation. Will obtain CXR, basic labs to determine etiology of shortness of breath. Doubt cardiac causes - no chest pain at this time.   Patient with multilobar pneumonia on CXR, with associated leukocytosis, cough. Will treat with azithromycin. Given tachypnea and tachycardia, will admit for observation, continued respiratory treatments. Patient in agreement with plan. Will place IV and begin fluids. Patient admitted to hospitalist in stable condition. Patient seen and evaluated by myself and my attending, Dr. Rubin Payor.        Imagene Sheller, MD 02/20/13 [phone]

## 2013-02-19 NOTE — ED Notes (Addendum)
Pt c/o shortness of breath, dry cough, nausea and diarrhea x's 5 day's.  Pt hyperventilating and st's she has hx of anxiety .  Pt also c/o chest pain with deep breath.

## 2013-02-20 ENCOUNTER — Encounter (HOSPITAL_COMMUNITY): Payer: Self-pay | Admitting: Internal Medicine

## 2013-02-20 DIAGNOSIS — K219 Gastro-esophageal reflux disease without esophagitis: Secondary | ICD-10-CM

## 2013-02-20 DIAGNOSIS — J189 Pneumonia, unspecified organism: Principal | ICD-10-CM | POA: Diagnosis present

## 2013-02-20 DIAGNOSIS — E669 Obesity, unspecified: Secondary | ICD-10-CM | POA: Diagnosis not present

## 2013-02-20 DIAGNOSIS — J9600 Acute respiratory failure, unspecified whether with hypoxia or hypercapnia: Secondary | ICD-10-CM

## 2013-02-20 DIAGNOSIS — B349 Viral infection, unspecified: Secondary | ICD-10-CM | POA: Diagnosis present

## 2013-02-20 DIAGNOSIS — D649 Anemia, unspecified: Secondary | ICD-10-CM

## 2013-02-20 DIAGNOSIS — F319 Bipolar disorder, unspecified: Secondary | ICD-10-CM

## 2013-02-20 DIAGNOSIS — I10 Essential (primary) hypertension: Secondary | ICD-10-CM

## 2013-02-20 DIAGNOSIS — E119 Type 2 diabetes mellitus without complications: Secondary | ICD-10-CM

## 2013-02-20 LAB — CBC WITH DIFFERENTIAL/PLATELET
HCT: 34.4 % — ABNORMAL LOW (ref 36.0–46.0)
Hemoglobin: 11.5 g/dL — ABNORMAL LOW (ref 12.0–15.0)
Lymphocytes Relative: 14 % (ref 12–46)
Lymphs Abs: 2.8 10*3/uL (ref 0.7–4.0)
Monocytes Absolute: 1.1 10*3/uL — ABNORMAL HIGH (ref 0.1–1.0)
Monocytes Relative: 5 % (ref 3–12)
Neutro Abs: 15.7 10*3/uL — ABNORMAL HIGH (ref 1.7–7.7)
Neutrophils Relative %: 80 % — ABNORMAL HIGH (ref 43–77)
RBC: 4.63 MIL/uL (ref 3.87–5.11)
WBC: 19.7 10*3/uL — ABNORMAL HIGH (ref 4.0–10.5)

## 2013-02-20 LAB — GLUCOSE, CAPILLARY
Glucose-Capillary: 180 mg/dL — ABNORMAL HIGH (ref 70–99)
Glucose-Capillary: 122 mg/dL — ABNORMAL HIGH (ref 70–99)

## 2013-02-20 LAB — INFLUENZA PANEL BY PCR (TYPE A & B): Influenza B By PCR: NEGATIVE

## 2013-02-20 MED ORDER — ONDANSETRON HCL 4 MG PO TABS: 4.0000 mg | ORAL_TABLET | Freq: Four times a day (QID) | ORAL | Status: DC | PRN

## 2013-02-20 MED ORDER — OSELTAMIVIR PHOSPHATE 75 MG PO CAPS
75.0000 mg | ORAL_CAPSULE | Freq: Two times a day (BID) | ORAL | Status: DC
  Administered 2013-02-20: 75 mg via ORAL
  Filled 2013-02-20 (×2): qty 1

## 2013-02-20 MED ORDER — PANTOPRAZOLE SODIUM 40 MG PO TBEC
40.0000 mg | DELAYED_RELEASE_TABLET | Freq: Every day | ORAL | Status: DC
  Administered 2013-02-20 – 2013-02-21 (×2): 40 mg via ORAL
  Filled 2013-02-20 (×2): qty 1

## 2013-02-20 MED ORDER — ACETAMINOPHEN 325 MG PO TABS: 650.0000 mg | ORAL_TABLET | Freq: Four times a day (QID) | ORAL | Status: DC | PRN

## 2013-02-20 MED ORDER — LEVOFLOXACIN IN D5W 750 MG/150ML IV SOLN
750.0000 mg | INTRAVENOUS | Status: DC
  Administered 2013-02-20 – 2013-02-21 (×2): 750 mg via INTRAVENOUS
  Filled 2013-02-20 (×3): qty 150

## 2013-02-20 MED ORDER — FUROSEMIDE 20 MG PO TABS
20.0000 mg | ORAL_TABLET | Freq: Two times a day (BID) | ORAL | Status: DC
  Administered 2013-02-20 – 2013-02-21 (×3): 20 mg via ORAL
  Filled 2013-02-20 (×7): qty 1

## 2013-02-20 MED ORDER — ALBUTEROL SULFATE HFA 108 (90 BASE) MCG/ACT IN AERS
2.0000 | INHALATION_SPRAY | Freq: Four times a day (QID) | RESPIRATORY_TRACT | Status: DC | PRN
  Filled 2013-02-20: qty 6.7

## 2013-02-20 MED ORDER — DOCUSATE SODIUM 100 MG PO CAPS
100.0000 mg | ORAL_CAPSULE | Freq: Two times a day (BID) | ORAL | Status: DC
  Administered 2013-02-20 – 2013-02-21 (×3): 100 mg via ORAL
  Filled 2013-02-20 (×4): qty 1

## 2013-02-20 MED ORDER — SODIUM CHLORIDE 0.9 % IV SOLN
250.0000 mL | INTRAVENOUS | Status: DC | PRN
  Administered 2013-02-20: 250 mL via INTRAVENOUS

## 2013-02-20 MED ORDER — SODIUM CHLORIDE 0.9 % IJ SOLN: 3.0000 mL | INTRAMUSCULAR | Status: DC | PRN

## 2013-02-20 MED ORDER — ASPIRIN EC 81 MG PO TBEC
81.0000 mg | DELAYED_RELEASE_TABLET | Freq: Every morning | ORAL | Status: DC
  Administered 2013-02-20 – 2013-02-21 (×2): 81 mg via ORAL
  Filled 2013-02-20 (×3): qty 1

## 2013-02-20 MED ORDER — INSULIN ASPART PROT & ASPART (70-30 MIX) 100 UNIT/ML ~~LOC~~ SUSP
25.0000 [IU] | Freq: Two times a day (BID) | SUBCUTANEOUS | Status: DC
  Administered 2013-02-20 – 2013-02-21 (×3): 25 [IU] via SUBCUTANEOUS
  Filled 2013-02-20: qty 10

## 2013-02-20 MED ORDER — GLIMEPIRIDE 4 MG PO TABS
4.0000 mg | ORAL_TABLET | Freq: Every day | ORAL | Status: DC
  Administered 2013-02-20: 4 mg via ORAL

## 2013-02-20 MED ORDER — INSULIN ASPART 100 UNIT/ML ~~LOC~~ SOLN: 0.0000 [IU] | Freq: Every day | SUBCUTANEOUS | Status: DC

## 2013-02-20 MED ORDER — ONDANSETRON HCL 4 MG/2ML IJ SOLN: 4.0000 mg | Freq: Four times a day (QID) | INTRAMUSCULAR | Status: DC | PRN

## 2013-02-20 MED ORDER — GLIPIZIDE ER 10 MG PO TB24
10.0000 mg | ORAL_TABLET | Freq: Two times a day (BID) | ORAL | Status: DC
  Administered 2013-02-20 (×2): 10 mg via ORAL
  Filled 2013-02-20 (×4): qty 1

## 2013-02-20 MED ORDER — ATENOLOL 25 MG PO TABS
25.0000 mg | ORAL_TABLET | Freq: Two times a day (BID) | ORAL | Status: DC
  Administered 2013-02-20 – 2013-02-21 (×3): 25 mg via ORAL
  Filled 2013-02-20 (×4): qty 1

## 2013-02-20 MED ORDER — BENZONATATE 100 MG PO CAPS
100.0000 mg | ORAL_CAPSULE | Freq: Three times a day (TID) | ORAL | Status: DC | PRN
  Administered 2013-02-20 – 2013-02-21 (×4): 100 mg via ORAL
  Filled 2013-02-20 (×8): qty 1

## 2013-02-20 MED ORDER — INSULIN ASPART 100 UNIT/ML ~~LOC~~ SOLN
0.0000 [IU] | Freq: Three times a day (TID) | SUBCUTANEOUS | Status: DC
  Administered 2013-02-20: 3 [IU] via SUBCUTANEOUS
  Administered 2013-02-20 – 2013-02-21 (×3): 4 [IU] via SUBCUTANEOUS

## 2013-02-20 MED ORDER — TRAMADOL HCL 50 MG PO TABS
50.0000 mg | ORAL_TABLET | Freq: Four times a day (QID) | ORAL | Status: DC | PRN
  Administered 2013-02-20 – 2013-02-21 (×4): 50 mg via ORAL
  Filled 2013-02-20 (×4): qty 1

## 2013-02-20 MED ORDER — CLONAZEPAM 0.5 MG PO TABS
0.5000 mg | ORAL_TABLET | Freq: Three times a day (TID) | ORAL | Status: DC | PRN
  Administered 2013-02-20 (×3): 0.5 mg via ORAL
  Filled 2013-02-20 (×4): qty 1

## 2013-02-20 MED ORDER — ZOLPIDEM TARTRATE 5 MG PO TABS
5.0000 mg | ORAL_TABLET | Freq: Every day | ORAL | Status: DC
  Administered 2013-02-20: 5 mg via ORAL
  Filled 2013-02-20: qty 1

## 2013-02-20 MED ORDER — SODIUM CHLORIDE 0.9 % IJ SOLN: 3.0000 mL | Freq: Two times a day (BID) | INTRAMUSCULAR | Status: DC

## 2013-02-20 MED ORDER — GABAPENTIN 300 MG PO CAPS
300.0000 mg | ORAL_CAPSULE | Freq: Three times a day (TID) | ORAL | Status: DC
  Administered 2013-02-20 – 2013-02-21 (×5): 300 mg via ORAL
  Filled 2013-02-20 (×7): qty 1

## 2013-02-20 NOTE — Progress Notes (Signed)
Patient seen and admitted earlier this a.m. by my associate please refer to his assessment and plan.  We'll reassess next day flu panel negative  Lucio Litsey, Pamala Hurry

## 2013-02-20 NOTE — H&P (Signed)
[facility] History and Physical  Cheyenne Alvarez RUE:[id number] DOB: May 18, 1965    PCP:   Geraldo Pitter, MD   Chief Complaint: shortness of breath, coughs.  HPI: Cheyenne Alvarez is an 47 y.o. female with hx of severe anxiety, GERD, DM, HTN, bipolar disorder, presents to the ER with 4-5 days of malaise, yellow productive coughs, and shortness of breath.  She denied distant travel or ill contacts. She denied any chest pain, nausea, vomiting, diarrhea, or myalgia.  She said she had her flu shot this season.  Evalaution in the ER included a marked leukocytosis of 20K, normal Hb, renal fx tests, and electrolytes.  Her CXR however, showed bilateral infiltrates with bronchitic changes.  Hospitaltist was asked to admit her for CAP.  Rewiew of Systems:  Constitutional: No significant weight loss or weight gain Eyes: Negative for eye pain, redness and discharge, diplopia, visual changes, or flashes of light. ENMT: Negative for ear pain, hoarseness, nasal congestion, sinus pressure and sore throat. No headaches; tinnitus, drooling, or problem swallowing. Cardiovascular: Negative for chest pain, palpitations, diaphoresis, dyspnea and peripheral edema. ; No orthopnea, PND Respiratory: Negative for hemoptysis, wheezing and stridor. No pleuritic chestpain. Gastrointestinal: Negative for nausea, vomiting, diarrhea, constipation, abdominal pain, melena, blood in stool, hematemesis, jaundice and rectal bleeding.    Genitourinary: Negative for frequency, dysuria, incontinence,flank pain and hematuria; Musculoskeletal: Negative for back pain and neck pain. Negative for swelling and trauma.;  Skin: . Negative for pruritus, rash, abrasions, bruising and skin lesion.; ulcerations Neuro: Negative for headache, lightheadedness and neck stiffness. Negative for weakness, altered level of consciousness , altered mental status, extremity weakness, burning feet, involuntary movement, seizure and syncope.   Psych: negative for  depression, insomnia, tearfulness, panic attacks, hallucinations, paranoia, suicidal or homicidal ideation    Past Medical History  Diagnosis Date  . Anxiety   . GERD (gastroesophageal reflux disease)   . Diabetes mellitus   . Hypertension   . Bipolar 1 disorder     Past Surgical History  Procedure Laterality Date  . Abdominal hysterectomy    . Leg surgery    . Cholecystectomy    . Tubal ligation      Medications:  HOME MEDS: Prior to Admission medications   Medication Sig Start Date End Date Taking? Authorizing Provider  albuterol (PROVENTIL HFA;VENTOLIN HFA) 108 (90 BASE) MCG/ACT inhaler Inhale 2 puffs into the lungs every 6 (six) hours as needed for wheezing. For wheezing   Yes Historical Provider, MD  aspirin EC 81 MG tablet Take 81 mg by mouth every morning.   Yes Historical Provider, MD  atenolol (TENORMIN) 25 MG tablet Take 25 mg by mouth 2 (two) times daily.   Yes Historical Provider, MD  clonazePAM (KLONOPIN) 0.5 MG tablet Take 0.5 mg by mouth 3 (three) times daily as needed for anxiety.    Yes Historical Provider, MD  diphenhydrAMINE (BENADRYL) 25 mg capsule Take 50 mg by mouth at bedtime.   Yes Historical Provider, MD  furosemide (LASIX) 20 MG tablet Take 20 mg by mouth 2 (two) times daily.    Yes Historical Provider, MD  gabapentin (NEURONTIN) 300 MG capsule Take 300 mg by mouth 3 (three) times daily.    Yes Historical Provider, MD  glimepiride (AMARYL) 4 MG tablet Take 4 mg by mouth daily before breakfast.   Yes Historical Provider, MD  glipiZIDE (GLUCOTROL XL) 10 MG 24 hr tablet Take 10 mg by mouth 2 (two) times daily.   Yes Historical Provider, MD  hydrOXYzine (VISTARIL) 50 MG capsule Take 100 mg by mouth 3 (three) times daily.   Yes Historical Provider, MD  insulin NPH-regular (NOVOLIN 70/30) (70-30) 100 UNIT/ML injection Inject 25 Units into the skin 3 (three) times daily.   Yes Historical Provider, MD  pantoprazole (PROTONIX) 40 MG tablet  Take 40 mg by mouth daily.   Yes Historical Provider, MD  zolpidem (AMBIEN) 10 MG tablet Take 10 mg by mouth at bedtime.    Yes Historical Provider, MD  azithromycin (ZITHROMAX) 250 MG tablet Take 1 tablet (250 mg total) by mouth daily. 02/20/13 02/22/13  Imagene Sheller, MD     Allergies:  Allergies  Allergen Reactions  . Famotidine Anaphylaxis  . Meperidine Hcl Anaphylaxis  . Ramipril     angioedema  . Iohexol Itching and Rash    Social History:   reports that she has never smoked. She does not have any smokeless tobacco history on file. She reports that she does not drink alcohol or use illicit drugs.  Family History: No family history on file.   Physical Exam: Filed Vitals:   02/19/13 2006 02/19/13 2154 02/19/13 2345  BP: 146/84 124/67 122/74  Pulse: 116 113 114  Temp: 98.4 F (36.9 C) 98.8 F (37.1 C)   TempSrc: Oral Oral   Resp: 42 22 17  SpO2: 95% 98% 99%   Blood pressure 122/74, pulse 114, temperature 98.8 F (37.1 C), temperature source Oral, resp. rate 17, SpO2 99.00%.  GEN:  Pleasant  patient lying in the stretcher in no acute distress; cooperative with exam. PSYCH:  alert and oriented x4; does not appear anxious or depressed; affect is appropriate. HEENT: Mucous membranes pink and anicteric; PERRLA; EOM intact; no cervical lymphadenopathy nor thyromegaly or carotid bruit; no JVD; There were no stridor. Neck is very supple. Breasts:: Not examined CHEST WALL: No tenderness CHEST: Normal respiration, scattered rhonchi, but no rales or wheezing. HEART: Regular rate and rhythm.  There are no murmur, rub, or gallops.   BACK: No kyphosis or scoliosis; no CVA tenderness ABDOMEN: soft and non-tender; no masses, no organomegaly, normal abdominal bowel sounds; no pannus; no intertriginous candida. There is no rebound and no distention. Rectal Exam: Not done EXTREMITIES: No bone or joint deformity; age-appropriate arthropathy of the hands and knees; no edema; no  ulcerations.  There is no calf tenderness. Genitalia: not examined PULSES: 2+ and symmetric SKIN: Normal hydration no rash or ulceration CNS: Cranial nerves 2-12 grossly intact no focal lateralizing neurologic deficit.  Speech is fluent; uvula elevated with phonation, facial symmetry and tongue midline. DTR are normal bilaterally, cerebella exam is intact, barbinski is negative and strengths are equaled bilaterally.  No sensory loss.   Labs on Admission:  Basic Metabolic Panel:  Recent Labs Lab 02/19/13 2030  NA 132*  K 4.0  CL 94*  CO2 21  GLUCOSE 178*  BUN 8  CREATININE 0.87  CALCIUM 9.4   Liver Function Tests:  Recent Labs Lab 02/19/13 2030  AST 14  ALT 10  ALKPHOS 128*  BILITOT 0.3  PROT 8.5*  ALBUMIN 3.2*   No results found for this basename: LIPASE, AMYLASE,  in the last 168 hours No results found for this basename: AMMONIA,  in the last 168 hours CBC:  Recent Labs Lab 02/19/13 2030  WBC 20.3*  NEUTROABS 16.3*  HGB 12.2  HCT 34.3*  MCV 74.6*  PLT 382   Cardiac Enzymes: No results found for this basename: CKTOTAL, CKMB, CKMBINDEX, TROPONINI,  in the last  168 hours  CBG: No results found for this basename: GLUCAP,  in the last 168 hours   Radiological Exams on Admission: Dg Chest 2 View  02/19/2013   CLINICAL DATA:  Shortness of breath.  EXAM: CHEST  2 VIEW  COMPARISON:  Previous examinations, the most recent dated 08/16/2012.  FINDINGS: Patchy airspace opacity in both lungs. Diffuse peribronchial thickening. Borderline enlarged cardiac silhouette. Poor inspiration. Mild scoliosis. No pleural fluid. Cholecystectomy clips.  IMPRESSION: 1. Patchy bilateral pneumonia or alveolar edema. 2. Moderate bronchitic changes.   Electronically Signed   By: Gordan Payment M.D.   On: 02/19/2013 20:49    Assessment/Plan Present on Admission:  . CAP (community acquired pneumonia) . Acute respiratory failure with hypoxia . BIPOLAR AFFECTIVE DISORDER . DIABETES  MELLITUS . GERD . HYPERTENSION, BENIGN ESSENTIAL . OBESITY . TOBACCO ABUSE . PNEUMONIA . Viral syndrome  PLAN:  Will admit her for multilobar CAP, with DM, and possible viral syndrome.  She will be given IV Levaquin along with tamiflu and precaution pending PCR.  For her DM, I have continue her home regimen of mixtard insulin along with hypoglycemic oral agents.  Will add resistant SSI.  For her HTN, will continue her meds.  BP is in adequate controlled.  She is stable, full code, and will be admitted to [facility] service.  Thank you for asking me to participate in her care.    Other plans as per orders.  Code Status: FULL Unk Lightning, MD. [facility] Pager [phone] 7pm to 7am.  02/20/2013, 1:19 AM

## 2013-02-21 DIAGNOSIS — D649 Anemia, unspecified: Secondary | ICD-10-CM | POA: Diagnosis not present

## 2013-02-21 DIAGNOSIS — J189 Pneumonia, unspecified organism: Secondary | ICD-10-CM | POA: Diagnosis not present

## 2013-02-21 DIAGNOSIS — F411 Generalized anxiety disorder: Secondary | ICD-10-CM | POA: Diagnosis not present

## 2013-02-21 DIAGNOSIS — J9600 Acute respiratory failure, unspecified whether with hypoxia or hypercapnia: Secondary | ICD-10-CM | POA: Diagnosis not present

## 2013-02-21 LAB — CBC
MCH: 25.6 pg — ABNORMAL LOW (ref 26.0–34.0)
MCHC: 33.9 g/dL (ref 30.0–36.0)
Platelets: 430 10*3/uL — ABNORMAL HIGH (ref 150–400)
WBC: 14.2 10*3/uL — ABNORMAL HIGH (ref 4.0–10.5)

## 2013-02-21 LAB — GLUCOSE, CAPILLARY
Glucose-Capillary: 99 mg/dL (ref 70–99)
Glucose-Capillary: 169 mg/dL — ABNORMAL HIGH (ref 70–99)
Glucose-Capillary: 128 mg/dL — ABNORMAL HIGH (ref 70–99)

## 2013-02-21 MED ORDER — BENZONATATE 100 MG PO CAPS: 100.0000 mg | ORAL_CAPSULE | Freq: Three times a day (TID) | ORAL | Status: DC | PRN

## 2013-02-21 MED ORDER — TRAMADOL HCL 50 MG PO TABS: 50.0000 mg | ORAL_TABLET | Freq: Four times a day (QID) | ORAL | Status: DC | PRN

## 2013-02-21 MED ORDER — LEVOFLOXACIN 750 MG PO TABS: 750.0000 mg | ORAL_TABLET | Freq: Every day | ORAL | Status: DC

## 2013-02-21 MED ORDER — GLIPIZIDE ER 10 MG PO TB24
10.0000 mg | ORAL_TABLET | Freq: Two times a day (BID) | ORAL | Status: DC
  Administered 2013-02-21: 10 mg via ORAL
  Filled 2013-02-21 (×3): qty 1

## 2013-02-21 NOTE — Progress Notes (Signed)
Patient d/c to home, IV removed. Prescriptions given and instructions reviewed. 

## 2013-02-21 NOTE — Discharge Summary (Signed)
Physician Discharge Summary  Cheyenne Alvarez ZOX:[id number] DOB: 05/12/1965 DOA: 02/19/2013  PCP: Geraldo Pitter, MD  Admit date: 02/19/2013 Discharge date: 02/21/2013  Time spent: > 35 minutes  Recommendations for Outpatient Follow-up:  1. Please see discharge instructions below 2. Take antibiotic medication is recommended  Discharge Diagnoses:  Principal Problem:   CAP (community acquired pneumonia) Active Problems:   DIABETES MELLITUS   OBESITY   BIPOLAR AFFECTIVE DISORDER   TOBACCO ABUSE   HYPERTENSION, BENIGN ESSENTIAL   PNEUMONIA   GERD   Acute respiratory failure with hypoxia   Viral syndrome   Discharge Condition: Stable  Diet recommendation: Carb modified   There were no vitals filed for this visit.  History of present illness:  47 year old African American female with history of anxiety, GERD, DM, hypertension, bipolar disorder, who presented to the ER with 4-5 days complaint of malaise associated with productive cough and shortness of breath. Chest x-ray reported bilateral infiltrate consistent with pneumonia and patient was admitted and treated for community-acquired pneumonia.  Hospital Course:  Principal Problem:   CAP (community acquired pneumonia) - Discharge on Levaquin - Oxygen levels assessed and no hypoxia or dips reported below 88% - Antitussives - Flu panel negative  Active Problems:   DIABETES MELLITUS -Stable continue diabetic diet and home regimen    OBESITY   BIPOLAR AFFECTIVE DISORDER -Stable continue home regimen    TOBACCO ABUSE -Recommended cessation    HYPERTENSION, BENIGN ESSENTIAL - Continue home regimen, currently stable    GERD - Stable continue Protonix    Acute respiratory failure with hypoxia - Resolved on current therapy. Will discharge on antibiotic   Procedures:  None  Consultations:  None  Discharge Exam: Filed Vitals:   02/21/13 1322  BP: 116/61  Pulse: 81  Temp: 97.7 F (36.5 C)  Resp: 16     General: Pt in NAD, alert and awake Cardiovascular: RRR, no MRG Respiratory: rhales more at left lung field, no wheezes, breathing comfortably in room air. Speaking in full sentences.  Discharge Instructions  Discharge Orders   Future Orders Complete By Expires   Call MD for:  difficulty breathing, headache or visual disturbances  As directed    Call MD for:  temperature >100.4  As directed    Diet - low sodium heart healthy  As directed    Discharge instructions  As directed    Comments:     Please follow up with your primary care physician in 1-2 weeks or sooner should any new concerns arise.   Increase activity slowly  As directed        Medication List         albuterol 108 (90 BASE) MCG/ACT inhaler  Commonly known as:  PROVENTIL HFA;VENTOLIN HFA  Inhale 2 puffs into the lungs every 6 (six) hours as needed for wheezing. For wheezing     aspirin EC 81 MG tablet  Take 81 mg by mouth every morning.     atenolol 25 MG tablet  Commonly known as:  TENORMIN  Take 25 mg by mouth 2 (two) times daily.     benzonatate 100 MG capsule  Commonly known as:  TESSALON  Take 1 capsule (100 mg total) by mouth 3 (three) times daily as needed for cough.     clonazePAM 0.5 MG tablet  Commonly known as:  KLONOPIN  Take 0.5 mg by mouth 3 (three) times daily as needed for anxiety.     diphenhydrAMINE 25 mg capsule  Commonly known as:  BENADRYL  Take 50 mg by mouth at bedtime.     furosemide 20 MG tablet  Commonly known as:  LASIX  Take 20 mg by mouth 2 (two) times daily.     gabapentin 300 MG capsule  Commonly known as:  NEURONTIN  Take 300 mg by mouth 3 (three) times daily.     glimepiride 4 MG tablet  Commonly known as:  AMARYL  Take 4 mg by mouth daily before breakfast.     glipiZIDE 10 MG 24 hr tablet  Commonly known as:  GLUCOTROL XL  Take 10 mg by mouth 2 (two) times daily.     hydrOXYzine 50 MG capsule  Commonly known as:  VISTARIL  Take 100 mg by mouth 3 (three)  times daily.     insulin NPH-regular (70-30) 100 UNIT/ML injection  Commonly known as:  NOVOLIN 70/30  Inject 25 Units into the skin 3 (three) times daily.     levofloxacin 750 MG tablet  Commonly known as:  LEVAQUIN  Take 1 tablet (750 mg total) by mouth daily.     pantoprazole 40 MG tablet  Commonly known as:  PROTONIX  Take 40 mg by mouth daily.     traMADol 50 MG tablet  Commonly known as:  ULTRAM  Take 1 tablet (50 mg total) by mouth every 6 (six) hours as needed for moderate pain or severe pain.     zolpidem 10 MG tablet  Commonly known as:  AMBIEN  Take 10 mg by mouth at bedtime.       Allergies  Allergen Reactions  . Famotidine Anaphylaxis  . Meperidine Hcl Anaphylaxis  . Ramipril     angioedema  . Iohexol Itching and Rash       Follow-up Information   Follow up with Geraldo Pitter, MD In 4 days.   Specialty:  Family Medicine   Contact information:   [address] [place] [address] [phone]        The results of significant diagnostics from this hospitalization (including imaging, microbiology, ancillary and laboratory) are listed below for reference.    Significant Diagnostic Studies: Dg Chest 2 View  02/19/2013   CLINICAL DATA:  Shortness of breath.  EXAM: CHEST  2 VIEW  COMPARISON:  Previous examinations, the most recent dated 08/16/2012.  FINDINGS: Patchy airspace opacity in both lungs. Diffuse peribronchial thickening. Borderline enlarged cardiac silhouette. Poor inspiration. Mild scoliosis. No pleural fluid. Cholecystectomy clips.  IMPRESSION: 1. Patchy bilateral pneumonia or alveolar edema. 2. Moderate bronchitic changes.   Electronically Signed   By: Gordan Payment M.D.   On: 02/19/2013 20:49    Microbiology: No results found for this or any previous visit (from the past 240 hour(s)).   Labs: Basic Metabolic Panel:  Recent Labs Lab 02/19/13 2030  NA 132*  K 4.0  CL 94*  CO2 21  GLUCOSE 178*  BUN 8  CREATININE 0.87   CALCIUM 9.4   Liver Function Tests:  Recent Labs Lab 02/19/13 2030  AST 14  ALT 10  ALKPHOS 128*  BILITOT 0.3  PROT 8.5*  ALBUMIN 3.2*   No results found for this basename: LIPASE, AMYLASE,  in the last 168 hours No results found for this basename: AMMONIA,  in the last 168 hours CBC:  Recent Labs Lab 02/19/13 2030 02/20/13 1312 02/21/13 1505  WBC 20.3* 19.7* 14.2*  NEUTROABS 16.3* 15.7*  --   HGB 12.2 11.5* 10.9*  HCT 34.3* 34.4* 32.2*  MCV 74.6*  74.3* 75.6*  PLT 382 446* 430*   Cardiac Enzymes: No results found for this basename: CKTOTAL, CKMB, CKMBINDEX, TROPONINI,  in the last 168 hours BNP: BNP (last 3 results)  Recent Labs  02/19/13 2030  PROBNP 19.9   CBG:  Recent Labs Lab 02/20/13 1126 02/20/13 1625 02/20/13 2134 02/21/13 0638 02/21/13 1100  GLUCAP 171* 144* 122* 99 169*       Signed:  Penny Pia  [facility] 02/21/2013, 3:53 PM

## 2013-02-25 NOTE — ED Provider Notes (Signed)
I saw and evaluated the patient, reviewed the resident's note and I agree with the findings and plan.  EKG Interpretation    Date/Time:  Tuesday February 19 2013 20:18:53 EST Ventricular Rate:  113 PR Interval:  136 QRS Duration: 72 QT Interval:  312 QTC Calculation: 427 R Axis:   8 Text Interpretation:  Sinus tachycardia Anterior infarct , age undetermined Abnormal ECG ED PHYSICIAN INTERPRETATION AVAILABLE IN [facility] Confirmed by TEST, RECORD ([id number]) on 02/21/2013 1:51:08 PM           Patient with likely multifocal pneumonia. Continue tachycardia and some shortness of breath. Will admit to internal medicine  Juliet Rude. Rubin Payor, MD 02/25/13 1551

## 2013-03-06 DIAGNOSIS — I10 Essential (primary) hypertension: Secondary | ICD-10-CM | POA: Diagnosis not present

## 2013-03-06 DIAGNOSIS — E119 Type 2 diabetes mellitus without complications: Secondary | ICD-10-CM | POA: Diagnosis not present

## 2013-03-29 DIAGNOSIS — E7800 Pure hypercholesterolemia, unspecified: Secondary | ICD-10-CM | POA: Diagnosis not present

## 2013-03-29 DIAGNOSIS — E119 Type 2 diabetes mellitus without complications: Secondary | ICD-10-CM | POA: Diagnosis not present

## 2013-03-29 DIAGNOSIS — F411 Generalized anxiety disorder: Secondary | ICD-10-CM | POA: Diagnosis not present

## 2013-03-29 DIAGNOSIS — F329 Major depressive disorder, single episode, unspecified: Secondary | ICD-10-CM | POA: Diagnosis not present

## 2013-03-29 DIAGNOSIS — F3289 Other specified depressive episodes: Secondary | ICD-10-CM | POA: Diagnosis not present

## 2013-06-24 DIAGNOSIS — F429 Obsessive-compulsive disorder, unspecified: Secondary | ICD-10-CM | POA: Diagnosis not present

## 2013-07-15 DIAGNOSIS — I10 Essential (primary) hypertension: Secondary | ICD-10-CM | POA: Diagnosis not present

## 2013-07-15 DIAGNOSIS — E119 Type 2 diabetes mellitus without complications: Secondary | ICD-10-CM | POA: Diagnosis not present

## 2013-08-20 DIAGNOSIS — E119 Type 2 diabetes mellitus without complications: Secondary | ICD-10-CM | POA: Diagnosis not present

## 2013-08-20 DIAGNOSIS — I10 Essential (primary) hypertension: Secondary | ICD-10-CM | POA: Diagnosis not present

## 2013-08-20 DIAGNOSIS — E7800 Pure hypercholesterolemia, unspecified: Secondary | ICD-10-CM | POA: Diagnosis not present

## 2013-09-27 DIAGNOSIS — E7800 Pure hypercholesterolemia, unspecified: Secondary | ICD-10-CM | POA: Diagnosis not present

## 2013-09-27 DIAGNOSIS — E119 Type 2 diabetes mellitus without complications: Secondary | ICD-10-CM | POA: Diagnosis not present

## 2013-09-27 DIAGNOSIS — F411 Generalized anxiety disorder: Secondary | ICD-10-CM | POA: Diagnosis not present

## 2013-09-27 DIAGNOSIS — F3289 Other specified depressive episodes: Secondary | ICD-10-CM | POA: Diagnosis not present

## 2013-09-27 DIAGNOSIS — F329 Major depressive disorder, single episode, unspecified: Secondary | ICD-10-CM | POA: Diagnosis not present

## 2013-10-02 DIAGNOSIS — F259 Schizoaffective disorder, unspecified: Secondary | ICD-10-CM | POA: Diagnosis not present

## 2013-10-23 DIAGNOSIS — F259 Schizoaffective disorder, unspecified: Secondary | ICD-10-CM | POA: Diagnosis not present

## 2013-11-18 DIAGNOSIS — R109 Unspecified abdominal pain: Secondary | ICD-10-CM | POA: Diagnosis not present

## 2013-12-17 ENCOUNTER — Other Ambulatory Visit (HOSPITAL_COMMUNITY): Payer: Self-pay | Admitting: Family Medicine

## 2013-12-17 DIAGNOSIS — Z1231 Encounter for screening mammogram for malignant neoplasm of breast: Secondary | ICD-10-CM

## 2013-12-27 ENCOUNTER — Ambulatory Visit (HOSPITAL_COMMUNITY): Payer: Medicare Other | Attending: Family Medicine

## 2014-01-20 DIAGNOSIS — F250 Schizoaffective disorder, bipolar type: Secondary | ICD-10-CM | POA: Diagnosis not present

## 2014-03-07 DIAGNOSIS — F250 Schizoaffective disorder, bipolar type: Secondary | ICD-10-CM | POA: Diagnosis not present

## 2014-03-08 ENCOUNTER — Emergency Department (HOSPITAL_COMMUNITY)
Admission: EM | Admit: 2014-03-08 | Discharge: 2014-03-08 | Disposition: A | Discharge status: Home / Self Care | Payer: Medicare Other | Attending: Emergency Medicine | Admitting: Emergency Medicine

## 2014-03-08 ENCOUNTER — Encounter (HOSPITAL_COMMUNITY): Payer: Self-pay | Admitting: *Deleted

## 2014-03-08 DIAGNOSIS — Z9851 Tubal ligation status: Secondary | ICD-10-CM | POA: Insufficient documentation

## 2014-03-08 DIAGNOSIS — Z79899 Other long term (current) drug therapy: Secondary | ICD-10-CM | POA: Insufficient documentation

## 2014-03-08 DIAGNOSIS — F319 Bipolar disorder, unspecified: Secondary | ICD-10-CM | POA: Diagnosis not present

## 2014-03-08 DIAGNOSIS — Z7982 Long term (current) use of aspirin: Secondary | ICD-10-CM | POA: Insufficient documentation

## 2014-03-08 DIAGNOSIS — K2970 Gastritis, unspecified, without bleeding: Secondary | ICD-10-CM | POA: Diagnosis not present

## 2014-03-08 DIAGNOSIS — R1013 Epigastric pain: Secondary | ICD-10-CM | POA: Diagnosis present

## 2014-03-08 DIAGNOSIS — F419 Anxiety disorder, unspecified: Secondary | ICD-10-CM | POA: Insufficient documentation

## 2014-03-08 DIAGNOSIS — K219 Gastro-esophageal reflux disease without esophagitis: Secondary | ICD-10-CM | POA: Insufficient documentation

## 2014-03-08 DIAGNOSIS — I10 Essential (primary) hypertension: Secondary | ICD-10-CM | POA: Diagnosis not present

## 2014-03-08 DIAGNOSIS — Z794 Long term (current) use of insulin: Secondary | ICD-10-CM | POA: Diagnosis not present

## 2014-03-08 DIAGNOSIS — Z792 Long term (current) use of antibiotics: Secondary | ICD-10-CM | POA: Insufficient documentation

## 2014-03-08 DIAGNOSIS — E1165 Type 2 diabetes mellitus with hyperglycemia: Secondary | ICD-10-CM | POA: Insufficient documentation

## 2014-03-08 DIAGNOSIS — R6889 Other general symptoms and signs: Secondary | ICD-10-CM | POA: Diagnosis not present

## 2014-03-08 LAB — URINALYSIS, ROUTINE W REFLEX MICROSCOPIC
Bilirubin Urine: NEGATIVE
Glucose, UA: 250 mg/dL — AB
KETONES UR: 15 mg/dL — AB
Leukocytes, UA: NEGATIVE
Nitrite: POSITIVE — AB
PROTEIN: 100 mg/dL — AB
Specific Gravity, Urine: 1.031 — ABNORMAL HIGH (ref 1.005–1.030)
UROBILINOGEN UA: 1 mg/dL (ref 0.0–1.0)
pH: 5.5 (ref 5.0–8.0)

## 2014-03-08 LAB — CBC WITH DIFFERENTIAL/PLATELET
Basophils Absolute: 0 10*3/uL (ref 0.0–0.1)
Basophils Relative: 0 % (ref 0–1)
EOS PCT: 0 % (ref 0–5)
Eosinophils Absolute: 0.1 10*3/uL (ref 0.0–0.7)
HEMATOCRIT: 36.9 % (ref 36.0–46.0)
Hemoglobin: 12.4 g/dL (ref 12.0–15.0)
Lymphocytes Relative: 24 % (ref 12–46)
Lymphs Abs: 3.2 10*3/uL (ref 0.7–4.0)
MCH: 26.3 pg (ref 26.0–34.0)
MCHC: 33.6 g/dL (ref 30.0–36.0)
MCV: 78.2 fL (ref 78.0–100.0)
MONO ABS: 0.8 10*3/uL (ref 0.1–1.0)
MONOS PCT: 6 % (ref 3–12)
Neutro Abs: 9.4 10*3/uL — ABNORMAL HIGH (ref 1.7–7.7)
Neutrophils Relative %: 70 % (ref 43–77)
Platelets: 379 10*3/uL (ref 150–400)
RBC: 4.72 MIL/uL (ref 3.87–5.11)
RDW: 14.6 % (ref 11.5–15.5)
WBC: 13.5 10*3/uL — ABNORMAL HIGH (ref 4.0–10.5)

## 2014-03-08 LAB — COMPREHENSIVE METABOLIC PANEL
ALK PHOS: 97 U/L (ref 39–117)
ALT: 12 U/L (ref 0–35)
ANION GAP: 12 (ref 5–15)
AST: 28 U/L (ref 0–37)
Albumin: 3.4 g/dL — ABNORMAL LOW (ref 3.5–5.2)
BUN: 8 mg/dL (ref 6–23)
CALCIUM: 9.2 mg/dL (ref 8.4–10.5)
CHLORIDE: 99 meq/L (ref 96–112)
CO2: 19 mmol/L (ref 19–32)
CREATININE: 0.99 mg/dL (ref 0.50–1.10)
GFR calc non Af Amer: 66 mL/min — ABNORMAL LOW (ref >90)
GFR, EST AFRICAN AMERICAN: 76 mL/min — AB (ref >90)
Glucose, Bld: 270 mg/dL — ABNORMAL HIGH (ref 70–99)
POTASSIUM: 3.8 mmol/L (ref 3.5–5.1)
Sodium: 130 mmol/L — ABNORMAL LOW (ref 135–145)
Total Bilirubin: 0.3 mg/dL (ref 0.3–1.2)
Total Protein: 7.6 g/dL (ref 6.0–8.3)

## 2014-03-08 LAB — URINE MICROSCOPIC-ADD ON

## 2014-03-08 LAB — LIPASE, BLOOD: LIPASE: 31 U/L (ref 11–59)

## 2014-03-08 MED ORDER — MORPHINE SULFATE 4 MG/ML IJ SOLN: 4.0000 mg | INTRAMUSCULAR | Status: DC | PRN

## 2014-03-08 MED ORDER — PANTOPRAZOLE SODIUM 40 MG IV SOLR
40.0000 mg | Freq: Once | INTRAVENOUS | Status: AC
  Administered 2014-03-08: 40 mg via INTRAVENOUS
  Filled 2014-03-08: qty 40

## 2014-03-08 MED ORDER — SUCRALFATE 1 G PO TABS
1.0000 g | ORAL_TABLET | Freq: Once | ORAL | Status: AC
Start: 2014-03-08 — End: 2014-03-08
  Administered 2014-03-08: 1 g via ORAL
  Filled 2014-03-08: qty 1

## 2014-03-08 MED ORDER — PROMETHAZINE HCL 25 MG/ML IJ SOLN
12.5000 mg | Freq: Once | INTRAMUSCULAR | Status: AC
  Administered 2014-03-08: 12.5 mg via INTRAVENOUS
  Filled 2014-03-08: qty 1

## 2014-03-08 MED ORDER — PANTOPRAZOLE SODIUM 40 MG PO TBEC: 40.0000 mg | DELAYED_RELEASE_TABLET | Freq: Two times a day (BID) | ORAL | Status: DC

## 2014-03-08 MED ORDER — HYDROMORPHONE HCL 1 MG/ML IJ SOLN
1.0000 mg | Freq: Once | INTRAMUSCULAR | Status: AC
  Administered 2014-03-08: 1 mg via INTRAVENOUS
  Filled 2014-03-08: qty 1

## 2014-03-08 MED ORDER — ONDANSETRON HCL 4 MG/2ML IJ SOLN
4.0000 mg | Freq: Once | INTRAMUSCULAR | Status: AC
  Administered 2014-03-08: 4 mg via INTRAVENOUS
  Filled 2014-03-08: qty 2

## 2014-03-08 MED ORDER — SODIUM CHLORIDE 0.9 % IV BOLUS (SEPSIS)
1000.0000 mL | Freq: Once | INTRAVENOUS | Status: AC
  Administered 2014-03-08: 1000 mL via INTRAVENOUS

## 2014-03-08 MED ORDER — GI COCKTAIL ~~LOC~~
30.0000 mL | Freq: Once | ORAL | Status: AC
  Administered 2014-03-08: 30 mL via ORAL
  Filled 2014-03-08: qty 30

## 2014-03-08 NOTE — ED Provider Notes (Signed)
CSN: [id number]     Arrival date & time 03/08/14  1907 History   First MD Initiated Contact with Patient 03/08/14 2038     Chief Complaint  Patient presents with  . Hyperglycemia  . Hypertension     (Consider location/radiation/quality/duration/timing/severity/associated sxs/prior Treatment) Patient is a 49 y.o. female presenting with hyperglycemia, hypertension, and abdominal pain.  Hyperglycemia Blood sugar level PTA:  278 Context: not change in medication and not new diabetes diagnosis   Associated symptoms: abdominal pain, dehydration and nausea   Associated symptoms: no chest pain, no dysuria, no fever, no shortness of breath and no vomiting   Abdominal pain:    Location:  Epigastric   Quality:  Burning and sharp   Severity:  Moderate   Onset quality:  Gradual   Duration:  1 day   Timing:  Constant   Progression:  Waxing and waning   Chronicity:  Recurrent Risk factors: obesity   Risk factors: no hx of DKA   Hypertension Associated symptoms include abdominal pain and nausea. Pertinent negatives include no chest pain, coughing, fever, headaches, neck pain, rash, sore throat or vomiting.  Abdominal Pain Pain location:  Epigastric Pain quality: burning   Pain radiates to:  Does not radiate Pain severity:  Severe Onset quality:  Gradual Duration:  2 days Timing:  Constant Progression:  Worsening Chronicity:  Recurrent Relieved by:  Eating Worsened by:  Nothing tried Ineffective treatments:  None tried Associated symptoms: diarrhea and nausea   Associated symptoms: no chest pain, no constipation, no cough, no dysuria, no fever, no hematemesis, no melena, no shortness of breath, no sore throat, no vaginal bleeding, no vaginal discharge and no vomiting   Risk factors: no alcohol abuse and no NSAID use (hx ulcers)     Past Medical History  Diagnosis Date  . Anxiety   . GERD (gastroesophageal reflux disease)   . Diabetes mellitus   . Hypertension   . Bipolar 1  disorder    Past Surgical History  Procedure Laterality Date  . Abdominal hysterectomy    . Leg surgery    . Cholecystectomy    . Tubal ligation     History reviewed. No pertinent family history. History  Substance Use Topics  . Smoking status: Never Smoker   . Smokeless tobacco: Not on file  . Alcohol Use: No   OB History    No data available     Review of Systems  Constitutional: Negative for fever.  HENT: Negative for sore throat.   Eyes: Negative for visual disturbance.  Respiratory: Negative for cough and shortness of breath.   Cardiovascular: Negative for chest pain.  Gastrointestinal: Positive for nausea, abdominal pain and diarrhea. Negative for vomiting, constipation, melena and hematemesis.  Genitourinary: Negative for dysuria, vaginal bleeding, vaginal discharge and difficulty urinating.  Musculoskeletal: Negative for back pain and neck pain.  Skin: Negative for rash.  Neurological: Negative for syncope and headaches.      Allergies  Famotidine; Meperidine hcl; Ramipril; and Iohexol  Home Medications   Prior to Admission medications   Medication Sig Start Date End Date Taking? Authorizing Provider  albuterol (PROVENTIL HFA;VENTOLIN HFA) 108 (90 BASE) MCG/ACT inhaler Inhale 2 puffs into the lungs every 6 (six) hours as needed for wheezing. For wheezing    Historical Provider, MD  aspirin EC 81 MG tablet Take 81 mg by mouth every morning.    Historical Provider, MD  atenolol (TENORMIN) 25 MG tablet Take 25 mg by mouth 2 (two) times  daily.    Historical Provider, MD  benzonatate (TESSALON) 100 MG capsule Take 1 capsule (100 mg total) by mouth 3 (three) times daily as needed for cough. 02/21/13   Penny Pia, MD  clonazePAM (KLONOPIN) 0.5 MG tablet Take 0.5 mg by mouth 3 (three) times daily as needed for anxiety.     Historical Provider, MD  diphenhydrAMINE (BENADRYL) 25 mg capsule Take 50 mg by mouth at bedtime.    Historical Provider, MD  furosemide (LASIX)  20 MG tablet Take 20 mg by mouth 2 (two) times daily.     Historical Provider, MD  gabapentin (NEURONTIN) 300 MG capsule Take 300 mg by mouth 3 (three) times daily.     Historical Provider, MD  glimepiride (AMARYL) 4 MG tablet Take 4 mg by mouth daily before breakfast.    Historical Provider, MD  glipiZIDE (GLUCOTROL XL) 10 MG 24 hr tablet Take 10 mg by mouth 2 (two) times daily.    Historical Provider, MD  hydrOXYzine (VISTARIL) 50 MG capsule Take 100 mg by mouth 3 (three) times daily.    Historical Provider, MD  insulin NPH-regular (NOVOLIN 70/30) (70-30) 100 UNIT/ML injection Inject 25 Units into the skin 3 (three) times daily.    Historical Provider, MD  levofloxacin (LEVAQUIN) 750 MG tablet Take 1 tablet (750 mg total) by mouth daily. 02/21/13   Penny Pia, MD  pantoprazole (PROTONIX) 40 MG tablet Take 40 mg by mouth daily.    Historical Provider, MD  traMADol (ULTRAM) 50 MG tablet Take 1 tablet (50 mg total) by mouth every 6 (six) hours as needed for moderate pain or severe pain. 02/21/13   Penny Pia, MD  zolpidem (AMBIEN) 10 MG tablet Take 10 mg by mouth at bedtime.     Historical Provider, MD   BP 134/80 mmHg  Pulse 80  Temp(Src) 98.5 F (36.9 C) (Oral)  Resp 24  SpO2 100% Physical Exam  Constitutional: She is oriented to person, place, and time. She appears well-developed and well-nourished. No distress.  HENT:  Head: Normocephalic and atraumatic.  Eyes: Conjunctivae and EOM are normal.  Neck: Normal range of motion.  Cardiovascular: Normal rate, regular rhythm, normal heart sounds and intact distal pulses.  Exam reveals no gallop and no friction rub.   No murmur heard. Pulmonary/Chest: Effort normal and breath sounds normal. No respiratory distress. She has no wheezes. She has no rales.  Abdominal: Soft. She exhibits no distension. There is tenderness (epigastrum). There is no guarding.  Musculoskeletal: She exhibits no edema or tenderness.  Neurological: She is alert and  oriented to person, place, and time.  Skin: Skin is warm and dry. No rash noted. She is not diaphoretic. No erythema.  Nursing note and vitals reviewed.   ED Course  Procedures (including critical care time) Labs Review Labs Reviewed  CBC WITH DIFFERENTIAL - Abnormal; Notable for the following:    WBC 13.5 (*)    Neutro Abs 9.4 (*)    All other components within normal limits  COMPREHENSIVE METABOLIC PANEL - Abnormal; Notable for the following:    Sodium 130 (*)    Glucose, Bld 270 (*)    Albumin 3.4 (*)    GFR calc non Af Amer 66 (*)    GFR calc Af Amer 76 (*)    All other components within normal limits  LIPASE, BLOOD  URINALYSIS, ROUTINE W REFLEX MICROSCOPIC  CBG MONITORING, ED    Imaging Review No results found.   EKG Interpretation None  MDM   Final diagnoses:  None   49 year old female with a history of diabetes, hypertension, bipolar, cholecystectomy, episodes of abdominal pain possibly gastroparesis, presents with concern of hyperglycemia, hypertension and abdominal pain.  Patient reports blood pressure up to 250 systolic at home, however on arrival to the emergency department a blood pressure was noted to be 134/80.  Patient denies any headache, chest pain, shortness of breath, visual changes, neurologic symptoms and have low suspicion for hypertensive emergency including low suspicion for [facility], hypertensive encephalopathy, stroke, MI, acute heart failure.  Glucose elevated at 270 however no signs of DKA.  Patient also reports abdominal pain.  Lipase and transaminases within normal limits. Patient is s/p cholecystectomy.  No lower abdominal pain to indicate appendicitis, diverticulitis, pelvic cause of pain.  Urinalysis with likely contamination and will culture however will not treat given no urinary or flank symptoms.  Patient with pain likely secondary to PUD, gastritis. Exam reassuring.  Improved with protonix/dilaudid/GI cocktail.  Told to increase protonix to  BID and follow up with PCP.  Patient discharged in stable condition with understanding of reasons to return.    Rhae Lerner, MD 03/09/14 1701  Rolland Porter, MD 03/18/14 [phone]

## 2014-03-08 NOTE — ED Notes (Addendum)
Pt in via EMS to triage, c/o hyperglycemia at home the last few days, hypertension, and nausea, pt given 4mg  zofran en route, appears anxious in triage, hyperventilating- pt also c/o abd pain

## 2014-03-08 NOTE — ED Notes (Signed)
Spoke with Rhea from the lab.  Lipase added on to previous blood work collected.

## 2014-03-08 NOTE — Discharge Instructions (Signed)

## 2014-03-08 NOTE — ED Notes (Signed)
Dr. Schlossman at bedside. 

## 2014-03-10 LAB — CBG MONITORING, ED: GLUCOSE-CAPILLARY: 270 mg/dL — AB (ref 70–99)

## 2014-03-11 LAB — URINE CULTURE: Colony Count: >=100000

## 2014-03-12 ENCOUNTER — Telehealth (HOSPITAL_BASED_OUTPATIENT_CLINIC_OR_DEPARTMENT_OTHER): Payer: Self-pay | Admitting: Emergency Medicine

## 2014-03-12 NOTE — Telephone Encounter (Addendum)
Post ED Visit - Positive Culture Follow-up: Successful Patient Follow-Up  Culture assessed and recommendations reviewed by: []  Cheyenne Alvarez, Pharm.D., BCPS []  Cheyenne MiyamotoJeremy Alvarez, Pharm.D., BCPS [x]  Georgina PillionElizabeth Alvarez, Pharm.D., BCPS []  [place]oketownMinh Alvarez, [address] Rainbow BoulevardPharm.D., BCPS, AAHIVP []  Estella HuskMichelle Alvarez, Pharm.D., BCPS, AAHIVP []  Red ChristiansSamson Alvarez, Pharm.D. []  Tennis Mustassie Stewart, Pharm.D.  Positive urine culture E. Coli  [x]  Patient discharged without antimicrobial prescription and treatment is now indicated []  Organism is resistant to prescribed ED discharge antimicrobial []  Patient with positive blood cultures  Changes discussed with ED provider: Allen DerryMercedes Camprubi-Soms [facility] New antibiotic prescription Bactrim DS 1 tab po bid x 3 days [facility] [address]   Contacted patient, date 03/12/14 1445   Berle MullMiller, Laporche Martelle 03/12/2014, 2:44 PM

## 2014-03-12 NOTE — Progress Notes (Signed)
ED Antimicrobial Stewardship Positive Culture Follow Up   Marylouise StacksWilma Gertha CalkinDenise Alvarez is an 49 y.o. female [facility] with a chief complaint of  Chief Complaint  Patient presents with  . Hyperglycemia  . Hypertension    Recent Results (from the past 720 hour(s))  Urine culture     Status: None   Collection Time: 03/08/14 10:08 PM  Result Value Ref Range Status   Specimen Description URINE, CLEAN CATCH  Final   Special Requests NONE  Final   Colony Count   Final    >=100,000 COLONIES/ML Performed at [facility]    Culture   Final    ESCHERICHIA COLI Performed at [facility]    Report Status 03/11/2014 FINAL  Final   Organism ID, Bacteria ESCHERICHIA COLI  Final      Susceptibility   Escherichia coli - MIC*    AMPICILLIN >=32 RESISTANT Resistant     CEFAZOLIN 8 SENSITIVE Sensitive     CEFTRIAXONE <=1 SENSITIVE Sensitive     CIPROFLOXACIN >=4 RESISTANT Resistant     GENTAMICIN <=1 SENSITIVE Sensitive     LEVOFLOXACIN >=8 RESISTANT Resistant     NITROFURANTOIN <=16 SENSITIVE Sensitive     TOBRAMYCIN <=1 SENSITIVE Sensitive     TRIMETH/SULFA <=20 SENSITIVE Sensitive     PIP/TAZO <=4 SENSITIVE Sensitive     * ESCHERICHIA COLI    [x]  Patient discharged originally without antimicrobial agent and treatment is now indicated  New antibiotic prescription: bactrim ds 1 tablet po BID for 3 days  ED Provider: Marcellus ScottMercedes Camprud-Soms, PA-C   Tera MaterPoole, Sherly Brodbeck L 03/12/2014, 2:18 PM Infectious Diseases Pharmacist Phone# [phone]

## 2014-03-13 ENCOUNTER — Emergency Department (HOSPITAL_COMMUNITY)
Admission: EM | Admit: 2014-03-13 | Discharge: 2014-03-14 | Disposition: A | Discharge status: Home / Self Care | Payer: Medicare Other | Attending: Emergency Medicine | Admitting: Emergency Medicine

## 2014-03-13 ENCOUNTER — Emergency Department (HOSPITAL_COMMUNITY): Payer: Medicare Other

## 2014-03-13 ENCOUNTER — Encounter (HOSPITAL_COMMUNITY): Payer: Self-pay | Admitting: *Deleted

## 2014-03-13 DIAGNOSIS — T782XXA Anaphylactic shock, unspecified, initial encounter: Secondary | ICD-10-CM | POA: Diagnosis not present

## 2014-03-13 DIAGNOSIS — R0602 Shortness of breath: Secondary | ICD-10-CM | POA: Diagnosis not present

## 2014-03-13 DIAGNOSIS — E119 Type 2 diabetes mellitus without complications: Secondary | ICD-10-CM | POA: Insufficient documentation

## 2014-03-13 DIAGNOSIS — Z9851 Tubal ligation status: Secondary | ICD-10-CM | POA: Insufficient documentation

## 2014-03-13 DIAGNOSIS — Z90710 Acquired absence of both cervix and uterus: Secondary | ICD-10-CM | POA: Insufficient documentation

## 2014-03-13 DIAGNOSIS — X58XXXA Exposure to other specified factors, initial encounter: Secondary | ICD-10-CM | POA: Insufficient documentation

## 2014-03-13 DIAGNOSIS — F419 Anxiety disorder, unspecified: Secondary | ICD-10-CM | POA: Diagnosis not present

## 2014-03-13 DIAGNOSIS — K219 Gastro-esophageal reflux disease without esophagitis: Secondary | ICD-10-CM | POA: Insufficient documentation

## 2014-03-13 DIAGNOSIS — Y9389 Activity, other specified: Secondary | ICD-10-CM | POA: Insufficient documentation

## 2014-03-13 DIAGNOSIS — R531 Weakness: Secondary | ICD-10-CM | POA: Diagnosis not present

## 2014-03-13 DIAGNOSIS — R1013 Epigastric pain: Secondary | ICD-10-CM | POA: Diagnosis present

## 2014-03-13 DIAGNOSIS — F319 Bipolar disorder, unspecified: Secondary | ICD-10-CM | POA: Diagnosis not present

## 2014-03-13 DIAGNOSIS — K2970 Gastritis, unspecified, without bleeding: Secondary | ICD-10-CM | POA: Diagnosis not present

## 2014-03-13 DIAGNOSIS — K210 Gastro-esophageal reflux disease with esophagitis: Secondary | ICD-10-CM | POA: Diagnosis not present

## 2014-03-13 DIAGNOSIS — Z7982 Long term (current) use of aspirin: Secondary | ICD-10-CM | POA: Diagnosis not present

## 2014-03-13 DIAGNOSIS — Y9289 Other specified places as the place of occurrence of the external cause: Secondary | ICD-10-CM | POA: Diagnosis not present

## 2014-03-13 DIAGNOSIS — R112 Nausea with vomiting, unspecified: Secondary | ICD-10-CM | POA: Diagnosis not present

## 2014-03-13 DIAGNOSIS — Z9049 Acquired absence of other specified parts of digestive tract: Secondary | ICD-10-CM | POA: Diagnosis not present

## 2014-03-13 DIAGNOSIS — R0989 Other specified symptoms and signs involving the circulatory and respiratory systems: Secondary | ICD-10-CM | POA: Diagnosis not present

## 2014-03-13 DIAGNOSIS — I10 Essential (primary) hypertension: Secondary | ICD-10-CM | POA: Diagnosis not present

## 2014-03-13 DIAGNOSIS — R05 Cough: Secondary | ICD-10-CM | POA: Diagnosis not present

## 2014-03-13 DIAGNOSIS — Y998 Other external cause status: Secondary | ICD-10-CM | POA: Insufficient documentation

## 2014-03-13 DIAGNOSIS — Z79899 Other long term (current) drug therapy: Secondary | ICD-10-CM | POA: Insufficient documentation

## 2014-03-13 DIAGNOSIS — F2089 Other schizophrenia: Secondary | ICD-10-CM | POA: Diagnosis not present

## 2014-03-13 DIAGNOSIS — R079 Chest pain, unspecified: Secondary | ICD-10-CM | POA: Diagnosis not present

## 2014-03-13 DIAGNOSIS — E0800 Diabetes mellitus due to underlying condition with hyperosmolarity without nonketotic hyperglycemic-hyperosmolar coma (NKHHC): Secondary | ICD-10-CM | POA: Diagnosis not present

## 2014-03-13 LAB — CBC WITH DIFFERENTIAL/PLATELET
BASOS PCT: 0 % (ref 0–1)
Basophils Absolute: 0 10*3/uL (ref 0.0–0.1)
EOS PCT: 0 % (ref 0–5)
Eosinophils Absolute: 0 10*3/uL (ref 0.0–0.7)
HCT: 38 % (ref 36.0–46.0)
HEMOGLOBIN: 12.4 g/dL (ref 12.0–15.0)
LYMPHS PCT: 25 % (ref 12–46)
Lymphs Abs: 3.7 10*3/uL (ref 0.7–4.0)
MCH: 26.6 pg (ref 26.0–34.0)
MCHC: 32.6 g/dL (ref 30.0–36.0)
MCV: 81.5 fL (ref 78.0–100.0)
Monocytes Absolute: 0.3 10*3/uL (ref 0.1–1.0)
Monocytes Relative: 2 % — ABNORMAL LOW (ref 3–12)
NEUTROS ABS: 10.8 10*3/uL — AB (ref 1.7–7.7)
Neutrophils Relative %: 73 % (ref 43–77)
Platelets: 334 10*3/uL (ref 150–400)
RBC: 4.66 MIL/uL (ref 3.87–5.11)
RDW: 14.7 % (ref 11.5–15.5)
WBC: 14.9 10*3/uL — AB (ref 4.0–10.5)

## 2014-03-13 LAB — CBG MONITORING, ED: Glucose-Capillary: 199 mg/dL — ABNORMAL HIGH (ref 70–99)

## 2014-03-13 LAB — COMPREHENSIVE METABOLIC PANEL
ALBUMIN: 3.7 g/dL (ref 3.5–5.2)
ALT: 16 U/L (ref 0–35)
AST: 24 U/L (ref 0–37)
Alkaline Phosphatase: 90 U/L (ref 39–117)
Anion gap: 15 (ref 5–15)
BILIRUBIN TOTAL: 0.6 mg/dL (ref 0.3–1.2)
BUN: 13 mg/dL (ref 6–23)
CALCIUM: 9 mg/dL (ref 8.4–10.5)
CO2: 19 mmol/L (ref 19–32)
CREATININE: 1.55 mg/dL — AB (ref 0.50–1.10)
Chloride: 101 mEq/L (ref 96–112)
GFR calc non Af Amer: 38 mL/min — ABNORMAL LOW (ref >90)
GFR, EST AFRICAN AMERICAN: 44 mL/min — AB (ref >90)
Glucose, Bld: 290 mg/dL — ABNORMAL HIGH (ref 70–99)
Potassium: 3.3 mmol/L — ABNORMAL LOW (ref 3.5–5.1)
Sodium: 135 mmol/L (ref 135–145)
Total Protein: 7.5 g/dL (ref 6.0–8.3)

## 2014-03-13 LAB — TROPONIN I

## 2014-03-13 LAB — LIPASE, BLOOD: LIPASE: 31 U/L (ref 11–59)

## 2014-03-13 MED ORDER — ONDANSETRON HCL 4 MG/2ML IJ SOLN
4.0000 mg | Freq: Once | INTRAMUSCULAR | Status: AC
  Administered 2014-03-13: 4 mg via INTRAVENOUS
  Filled 2014-03-13: qty 2

## 2014-03-13 MED ORDER — EPINEPHRINE 0.3 MG/0.3ML IJ SOAJ
0.3000 mg | Freq: Once | INTRAMUSCULAR | Status: AC
  Administered 2014-03-13: 0.3 mg via INTRAMUSCULAR
  Filled 2014-03-13: qty 0.3

## 2014-03-13 MED ORDER — METHYLPREDNISOLONE SODIUM SUCC 125 MG IJ SOLR
125.0000 mg | Freq: Once | INTRAMUSCULAR | Status: AC
  Administered 2014-03-13: 125 mg via INTRAVENOUS
  Filled 2014-03-13: qty 2

## 2014-03-13 MED ORDER — SODIUM CHLORIDE 0.9 % IV BOLUS (SEPSIS)
1000.0000 mL | INTRAVENOUS | Status: AC
  Administered 2014-03-13: 1000 mL via INTRAVENOUS

## 2014-03-13 MED ORDER — HYDROMORPHONE HCL 1 MG/ML IJ SOLN
0.5000 mg | Freq: Once | INTRAMUSCULAR | Status: AC
  Administered 2014-03-13: 0.5 mg via INTRAVENOUS
  Filled 2014-03-13: qty 1

## 2014-03-13 MED ORDER — EPINEPHRINE 0.3 MG/0.3ML IJ SOAJ: 0.3000 mg | Freq: Once | INTRAMUSCULAR | Status: DC

## 2014-03-13 MED ORDER — SODIUM CHLORIDE 0.9 % IV BOLUS (SEPSIS): 500.0000 mL | Freq: Once | INTRAVENOUS | Status: DC

## 2014-03-13 MED ORDER — HYDROMORPHONE HCL 1 MG/ML IJ SOLN
1.0000 mg | Freq: Once | INTRAMUSCULAR | Status: AC
  Administered 2014-03-13: 1 mg via INTRAVENOUS
  Filled 2014-03-13: qty 1

## 2014-03-13 MED ORDER — DIPHENHYDRAMINE HCL 50 MG/ML IJ SOLN
50.0000 mg | Freq: Once | INTRAMUSCULAR | Status: AC
  Administered 2014-03-13: 50 mg via INTRAVENOUS
  Filled 2014-03-13: qty 1

## 2014-03-13 NOTE — ED Notes (Signed)
Patient c/o upper abdominal pain, rates 10/10, x1 day, 3 episodes of vomiting, 1 episode of diarrhea in past 24 hours. Patient also c/o sternal intermittent  chest "heaviness" x2 hours, with pain radiating to back of neck. Patient c/o bilateral blurred vision.

## 2014-03-13 NOTE — ED Notes (Signed)
PT arrives to the ER via EMS for complaints of abd pain N/V/D; pt reports 1 episode of vomiting and 1 episode of diarrhea today; pt states that she saw her PCP and was given some medication (pt could not remember name) and it made it worse; EMS started an IV and gave 4mg  Zofran

## 2014-03-13 NOTE — ED Provider Notes (Signed)
CSN: [id number]     Arrival date & time 03/13/14  2007 History   First MD Initiated Contact with Patient 03/13/14 2022     Chief Complaint  Patient presents with  . Abdominal Pain     (Consider location/radiation/quality/duration/timing/severity/associated sxs/prior Treatment) Patient is a 49 y.o. female presenting with abdominal pain. The history is provided by the patient.  Abdominal Pain Pain location:  Epigastric Pain quality: burning   Pain radiates to:  Does not radiate Pain severity:  Moderate Onset quality:  Sudden Duration:  2 hours Timing:  Constant Progression:  Unchanged Chronicity:  Recurrent Context comment:  After taking dexilant Relieved by:  Nothing Worsened by:  Nothing tried Ineffective treatments:  None tried Associated symptoms: chest pain, diarrhea, nausea, shortness of breath and vomiting   Associated symptoms: no cough, no dysuria, no fatigue, no fever and no hematuria     Past Medical History  Diagnosis Date  . Anxiety   . GERD (gastroesophageal reflux disease)   . Diabetes mellitus   . Hypertension   . Bipolar 1 disorder    Past Surgical History  Procedure Laterality Date  . Abdominal hysterectomy    . Leg surgery    . Cholecystectomy    . Tubal ligation     No family history on file. History  Substance Use Topics  . Smoking status: Never Smoker   . Smokeless tobacco: Not on file  . Alcohol Use: No   OB History    No data available     Review of Systems  Constitutional: Negative for fever and fatigue.  HENT: Negative for congestion and drooling.   Eyes: Negative for pain.  Respiratory: Positive for shortness of breath. Negative for cough.   Cardiovascular: Positive for chest pain.  Gastrointestinal: Positive for nausea, vomiting, abdominal pain and diarrhea.  Genitourinary: Negative for dysuria and hematuria.  Musculoskeletal: Negative for back pain, gait problem and neck pain.  Skin: Negative for color change.  Neurological:  Negative for dizziness and headaches.  Hematological: Negative for adenopathy.  Psychiatric/Behavioral: Negative for behavioral problems.  All other systems reviewed and are negative.     Allergies  Famotidine; Meperidine hcl; Dexilant; Ramipril; Invokana; and Iohexol  Home Medications   Prior to Admission medications   Medication Sig Start Date End Date Taking? Authorizing Provider  albuterol (PROVENTIL HFA;VENTOLIN HFA) 108 (90 BASE) MCG/ACT inhaler Inhale 2 puffs into the lungs every 6 (six) hours as needed for wheezing. For wheezing   Yes Historical Provider, MD  aspirin EC 81 MG tablet Take 81 mg by mouth every morning.   Yes Historical Provider, MD  atenolol (TENORMIN) 25 MG tablet Take 25 mg by mouth 2 (two) times daily.   Yes Historical Provider, MD  clonazePAM (KLONOPIN) 0.5 MG tablet Take 0.5 mg by mouth 3 (three) times daily as needed for anxiety.    Yes Historical Provider, MD  diphenhydrAMINE (BENADRYL) 25 mg capsule Take 50 mg by mouth at bedtime.   Yes Historical Provider, MD  furosemide (LASIX) 20 MG tablet Take 20 mg by mouth 2 (two) times daily.    Yes Historical Provider, MD  gabapentin (NEURONTIN) 300 MG capsule Take 300 mg by mouth 3 (three) times daily.    Yes Historical Provider, MD  glimepiride (AMARYL) 4 MG tablet Take 4 mg by mouth daily before breakfast.   Yes Historical Provider, MD  glipiZIDE (GLUCOTROL XL) 10 MG 24 hr tablet Take 10 mg by mouth 2 (two) times daily.   Yes Historical  Provider, MD  hydrOXYzine (VISTARIL) 50 MG capsule Take 100 mg by mouth 3 (three) times daily.   Yes Historical Provider, MD  metFORMIN (GLUCOPHAGE) 500 MG tablet Take 500 mg by mouth 2 (two) times daily with a meal.   Yes Historical Provider, MD  pantoprazole (PROTONIX) 40 MG tablet Take 1 tablet (40 mg total) by mouth 2 (two) times daily. 03/08/14  Yes Alvira Monday, MD  sulfamethoxazole-trimethoprim (BACTRIM DS,SEPTRA DS) 800-160 MG per tablet Take 1 tablet by mouth 2 (two) times  daily.   Yes Historical Provider, MD  zolpidem (AMBIEN) 10 MG tablet Take 10 mg by mouth at bedtime.    Yes Historical Provider, MD  benzonatate (TESSALON) 100 MG capsule Take 1 capsule (100 mg total) by mouth 3 (three) times daily as needed for cough. Patient not taking: Reported on 03/08/2014 02/21/13   Penny Pia, MD  levofloxacin (LEVAQUIN) 750 MG tablet Take 1 tablet (750 mg total) by mouth daily. Patient not taking: Reported on 03/08/2014 02/21/13   Penny Pia, MD  traMADol (ULTRAM) 50 MG tablet Take 1 tablet (50 mg total) by mouth every 6 (six) hours as needed for moderate pain or severe pain. Patient not taking: Reported on 03/08/2014 02/21/13   Penny Pia, MD   BP 130/92 mmHg  Pulse 81  Temp(Src) 97.8 F (36.6 C) (Oral)  Resp 45  SpO2 100% Physical Exam  Constitutional: She is oriented to person, place, and time. She appears well-developed and well-nourished. She appears distressed.  HENT:  Head: Normocephalic and atraumatic.  Mouth/Throat: Oropharynx is clear and moist. No oropharyngeal exudate.  Eyes: Conjunctivae and EOM are normal. Pupils are equal, round, and reactive to light.  Neck: Normal range of motion. Neck supple.  Cardiovascular: Normal rate, regular rhythm, normal heart sounds and intact distal pulses.  Exam reveals no gallop and no friction rub.   No murmur heard. Pulmonary/Chest: Breath sounds normal. She is in respiratory distress. She has no wheezes.  Abdominal: Soft. Bowel sounds are normal. There is no tenderness. There is no rebound and no guarding.  Musculoskeletal: Normal range of motion. She exhibits no edema or tenderness.  Neurological: She is alert and oriented to person, place, and time.  Skin: Skin is warm and dry.  Psychiatric: She has a normal mood and affect. Her behavior is normal.  Nursing note and vitals reviewed.   ED Course  Procedures (including critical care time) Labs Review Labs Reviewed  CBC WITH DIFFERENTIAL - Abnormal; Notable  for the following:    WBC 14.9 (*)    Neutro Abs 10.8 (*)    Monocytes Relative 2 (*)    All other components within normal limits  CBG MONITORING, ED - Abnormal; Notable for the following:    Glucose-Capillary 199 (*)    All other components within normal limits  COMPREHENSIVE METABOLIC PANEL  TROPONIN I  LIPASE, BLOOD    Imaging Review Dg Chest 2 View  03/13/2014   CLINICAL DATA:  Weakness, shortness of breath. Left-sided chest pain. Cough and congestion. Symptoms for 3 days. History diabetes, hypertension, smoker.  EXAM: CHEST  2 VIEW  COMPARISON:  02/19/2013  FINDINGS: Heart size is normal. There has been significant improvement in aeration of the lungs. No new consolidations are identified. No pleural effusions or pulmonary edema.  IMPRESSION: No active cardiopulmonary disease.   Electronically Signed   By: Rosalie Gums M.D.   On: 03/13/2014 21:57     EKG Interpretation   Date/Time:  Thursday March 13 2014  20:21:59 EST Ventricular Rate:  67 PR Interval:    QRS Duration: 87 QT Interval:  450 QTC Calculation: 475 R Axis:   39 Text Interpretation:  Atrial fibrillation Baseline wander in lead(s) V2  Some baseline artifact, will repeat Confirmed by Kamariah Fruchter  MD, Ihan Pat  ([id number]) on 03/13/2014 8:26:26 PM     CRITICAL CARE Performed by: Purvis Sheffield, S Total critical care time: 30 min Critical care time was exclusive of separately billable procedures and treating other patients. Critical care was necessary to treat or prevent imminent or life-threatening deterioration. Critical care was time spent personally by me on the following activities: development of treatment plan with patient and/or surrogate as well as nursing, discussions with consultants, evaluation of patient's response to treatment, examination of patient, obtaining history from patient or surrogate, ordering and performing treatments and interventions, ordering and review of laboratory studies, ordering and  review of radiographic studies, pulse oximetry and re-evaluation of patient's condition.  MDM   Final diagnoses:  SOB (shortness of breath)  Anaphylaxis, initial encounter    9:34 PM 49 y.o. female w hx of HTN, DM who presents with multiple complaints. She was seen several days ago for hypertension, high blood sugar, and abdominal pain. She had a noncontributory laboratory workup  With the exception of a UTI. She has taken several doses of Bactrim for the UTI. She saw her doctor , Dr. Parke Simmers , today. She was started on a new medication called Dexilant.  She states that after taking this medication she became short of breath , developed chest pain , epigastric pain, nausea, vomiting, diarrhea. She states that she had a similar reaction to pepcid. She states she is allergic to pepcid. I suspect this is anaphylaxis. Given that there is two systems involved and the patient is tachypneic (RR in the 30's) on exam I will treat with an EpiPen. Will also give Solu-Medrol, Benadryl. Will not give an H2 blocker given her allergy to pepcid. BP stable.   Sx resolved.    Pt has been here for 4 hours. Remains asx.  She was educated on anaphylaxis and biphasic reactions. I offered steroids for home but she declined. Mild elev in Cr. Pt got 1L IVF here. I have discussed the diagnosis/risks/treatment options with the patient and believe the pt to be eligible for discharge home to follow-up with her pcp. We also discussed returning to the ED immediately if new or worsening sx occur. We discussed the sx which are most concerning (e.g., return of sx, sob, return of cp, fever, inc vomiting) that necessitate immediate return. Medications administered to the patient during their visit and any new prescriptions provided to the patient are listed below.  Medications given during this visit Medications  diphenhydrAMINE (BENADRYL) injection 50 mg (50 mg Intravenous Given 03/13/14 2103)  methylPREDNISolone sodium succinate  (SOLU-MEDROL) 125 mg/2 mL injection 125 mg (125 mg Intravenous Given 03/13/14 2103)  HYDROmorphone (DILAUDID) injection 0.5 mg (0.5 mg Intravenous Given 03/13/14 2103)  EPINEPHrine (EPI-PEN) injection 0.3 mg (0.3 mg Intramuscular Given 03/13/14 2107)  ondansetron (ZOFRAN) injection 4 mg (4 mg Intravenous Given 03/13/14 2154)  HYDROmorphone (DILAUDID) injection 1 mg (1 mg Intravenous Given 03/13/14 2201)  sodium chloride 0.9 % bolus 1,000 mL (1,000 mLs Intravenous New Bag/Given 03/13/14 2258)    New Prescriptions   EPINEPHRINE (EPIPEN 2-PAK) 0.3 MG/0.3 ML IJ SOAJ INJECTION    Inject 0.3 mLs (0.3 mg total) into the muscle once.     Purvis Sheffield, MD 03/14/14 [phone]

## 2014-03-13 NOTE — ED Notes (Signed)
Bed: [facility]4 Expected date:  Expected time:  Means of arrival:  Comments: EMS 49 yo female abdominal pain/N/V

## 2014-03-13 NOTE — Discharge Instructions (Signed)
Anaphylactic Reaction °An anaphylactic reaction is a sudden, severe allergic reaction. It affects the whole body. It can be life threatening. You may need to stay in the hospital.  °HOME CARE °· Wear a medical bracelet or necklace that lists your allergy. °· Carry your allergy kit or medicine shot to treat severe allergic reactions with you. These can save your life. °· Do not drive until medicine from your shot has worn off, unless your doctor says it is okay. °· If you have hives or a rash: °¨ Take medicine as told by your doctor. °¨ You may take over-the-counter antihistamine medicine. °¨ Place cold cloths on your skin. Take baths in cool water. Avoid hot baths and hot showers. °GET HELP RIGHT AWAY IF:  °· Your mouth is puffy (swollen), or you have trouble breathing. °· You start making whistling sounds when you breathe (wheezing). °· You have a tight feeling in your chest or throat. °· You have a rash, hives, puffiness, or itching on your body. °· You throw up (vomit) or have watery poop (diarrhea). °· You feel dizzy or pass out (faint). °· You think you are having an allergic reaction. °· You have new symptoms. °This is an emergency. Use your medicine shot or allergy kit as told. Call your local emergency services (911 in [place] Even if you feel better after the shot, you need to go to the hospital emergency department. °MAKE SURE YOU:  °· Understand these instructions. °· Will watch your condition. °· Will get help right away if you are not doing well or get worse. °Document Released: 08/03/2007 Document Revised: 08/16/2011 Document Reviewed: 05/18/2011 °ExitCare® Patient Information ©2015 [employer]. This information is not intended to replace advice given to you by your health care provider. Make sure you discuss any questions you have with your health care provider. ° °

## 2014-03-17 DIAGNOSIS — K210 Gastro-esophageal reflux disease with esophagitis: Secondary | ICD-10-CM | POA: Diagnosis not present

## 2014-03-17 DIAGNOSIS — E119 Type 2 diabetes mellitus without complications: Secondary | ICD-10-CM | POA: Diagnosis not present

## 2014-03-17 DIAGNOSIS — E1165 Type 2 diabetes mellitus with hyperglycemia: Secondary | ICD-10-CM | POA: Diagnosis not present

## 2014-04-01 DIAGNOSIS — K210 Gastro-esophageal reflux disease with esophagitis: Secondary | ICD-10-CM | POA: Diagnosis not present

## 2014-04-01 DIAGNOSIS — E1165 Type 2 diabetes mellitus with hyperglycemia: Secondary | ICD-10-CM | POA: Diagnosis not present

## 2014-04-01 DIAGNOSIS — F310 Bipolar disorder, current episode hypomanic: Secondary | ICD-10-CM | POA: Diagnosis not present

## 2014-04-02 DIAGNOSIS — E119 Type 2 diabetes mellitus without complications: Secondary | ICD-10-CM | POA: Diagnosis not present

## 2014-04-02 DIAGNOSIS — R079 Chest pain, unspecified: Secondary | ICD-10-CM | POA: Diagnosis not present

## 2014-04-02 DIAGNOSIS — R002 Palpitations: Secondary | ICD-10-CM | POA: Diagnosis not present

## 2014-04-02 DIAGNOSIS — K219 Gastro-esophageal reflux disease without esophagitis: Secondary | ICD-10-CM | POA: Diagnosis not present

## 2014-04-02 DIAGNOSIS — E785 Hyperlipidemia, unspecified: Secondary | ICD-10-CM | POA: Diagnosis not present

## 2014-04-02 DIAGNOSIS — E669 Obesity, unspecified: Secondary | ICD-10-CM | POA: Diagnosis not present

## 2014-04-02 DIAGNOSIS — I10 Essential (primary) hypertension: Secondary | ICD-10-CM | POA: Diagnosis not present

## 2014-04-02 DIAGNOSIS — R0609 Other forms of dyspnea: Secondary | ICD-10-CM | POA: Diagnosis not present

## 2014-04-02 DIAGNOSIS — I2510 Atherosclerotic heart disease of native coronary artery without angina pectoris: Secondary | ICD-10-CM | POA: Diagnosis not present

## 2014-04-10 ENCOUNTER — Other Ambulatory Visit: Payer: Self-pay | Admitting: Gastroenterology

## 2014-04-10 DIAGNOSIS — R197 Diarrhea, unspecified: Secondary | ICD-10-CM | POA: Diagnosis not present

## 2014-04-10 DIAGNOSIS — R1084 Generalized abdominal pain: Secondary | ICD-10-CM

## 2014-04-10 DIAGNOSIS — R1110 Vomiting, unspecified: Secondary | ICD-10-CM | POA: Diagnosis not present

## 2014-04-16 ENCOUNTER — Ambulatory Visit
Admission: RE | Admit: 2014-04-16 | Discharge: 2014-04-16 | Disposition: A | Discharge status: Home / Self Care | Payer: Medicare Other | Source: Ambulatory Visit | Attending: Gastroenterology | Admitting: Gastroenterology

## 2014-04-16 ENCOUNTER — Other Ambulatory Visit: Payer: Self-pay

## 2014-04-16 DIAGNOSIS — R1084 Generalized abdominal pain: Secondary | ICD-10-CM | POA: Diagnosis not present

## 2014-04-23 DIAGNOSIS — E1165 Type 2 diabetes mellitus with hyperglycemia: Secondary | ICD-10-CM | POA: Diagnosis not present

## 2014-05-07 ENCOUNTER — Institutional Professional Consult (permissible substitution): Payer: Self-pay | Admitting: Medical

## 2014-05-07 ENCOUNTER — Ambulatory Visit (INDEPENDENT_AMBULATORY_CARE_PROVIDER_SITE_OTHER): Payer: Medicare Other | Admitting: Medical

## 2014-05-07 ENCOUNTER — Encounter: Payer: Self-pay | Admitting: Medical

## 2014-05-07 VITALS — BP 130/80 | HR 78 | Temp 98.0°F | Resp 14 | Ht 63.0 in | Wt 216.0 lb

## 2014-05-07 DIAGNOSIS — E119 Type 2 diabetes mellitus without complications: Secondary | ICD-10-CM | POA: Diagnosis not present

## 2014-05-07 DIAGNOSIS — F418 Other specified anxiety disorders: Secondary | ICD-10-CM

## 2014-05-07 DIAGNOSIS — F329 Major depressive disorder, single episode, unspecified: Secondary | ICD-10-CM

## 2014-05-07 DIAGNOSIS — F32A Depression, unspecified: Secondary | ICD-10-CM

## 2014-05-07 DIAGNOSIS — K089 Disorder of teeth and supporting structures, unspecified: Secondary | ICD-10-CM

## 2014-05-07 DIAGNOSIS — IMO0002 Reserved for concepts with insufficient information to code with codable children: Secondary | ICD-10-CM

## 2014-05-07 DIAGNOSIS — E1165 Type 2 diabetes mellitus with hyperglycemia: Secondary | ICD-10-CM

## 2014-05-07 DIAGNOSIS — K088 Other specified disorders of teeth and supporting structures: Secondary | ICD-10-CM | POA: Diagnosis not present

## 2014-05-07 DIAGNOSIS — R609 Edema, unspecified: Secondary | ICD-10-CM | POA: Diagnosis not present

## 2014-05-07 DIAGNOSIS — F319 Bipolar disorder, unspecified: Secondary | ICD-10-CM

## 2014-05-07 DIAGNOSIS — F419 Anxiety disorder, unspecified: Secondary | ICD-10-CM

## 2014-05-07 DIAGNOSIS — Z1211 Encounter for screening for malignant neoplasm of colon: Secondary | ICD-10-CM

## 2014-05-07 DIAGNOSIS — E876 Hypokalemia: Secondary | ICD-10-CM | POA: Diagnosis not present

## 2014-05-07 DIAGNOSIS — I10 Essential (primary) hypertension: Secondary | ICD-10-CM

## 2014-05-07 LAB — BASIC METABOLIC PANEL
BUN: 8 mg/dL (ref 6–23)
CHLORIDE: 101 meq/L (ref 96–112)
CO2: 21 mEq/L (ref 19–32)
Calcium: 8.9 mg/dL (ref 8.4–10.5)
Creat: 0.75 mg/dL (ref 0.50–1.10)
Glucose, Bld: 174 mg/dL — ABNORMAL HIGH (ref 70–99)
Potassium: 4.2 mEq/L (ref 3.5–5.3)
Sodium: 135 mEq/L (ref 135–145)

## 2014-05-07 MED ORDER — METFORMIN HCL 500 MG PO TABS: 500.0000 mg | ORAL_TABLET | Freq: Two times a day (BID) | ORAL | Status: DC

## 2014-05-07 MED ORDER — FUROSEMIDE 20 MG PO TABS: 20.0000 mg | ORAL_TABLET | Freq: Every day | ORAL | Status: DC

## 2014-05-07 MED ORDER — AMLODIPINE BESYLATE 10 MG PO TABS: 10.0000 mg | ORAL_TABLET | Freq: Every day | ORAL | Status: DC

## 2014-05-07 MED ORDER — SITAGLIPTIN PHOSPHATE 50 MG PO TABS: 50.0000 mg | ORAL_TABLET | Freq: Every day | ORAL | Status: DC

## 2014-05-07 MED ORDER — INSULIN GLARGINE 100 UNIT/ML SOLOSTAR PEN: 10.0000 [IU] | PEN_INJECTOR | Freq: Every day | SUBCUTANEOUS | Status: DC

## 2014-05-07 MED ORDER — ATENOLOL 50 MG PO TABS: 50.0000 mg | ORAL_TABLET | Freq: Two times a day (BID) | ORAL | Status: DC

## 2014-05-07 NOTE — Patient Instructions (Addendum)
I strongly recommend you bring your aid or daughter with you to appointments  I strongly recommend you only use [facility] for your routine medications, and [facility] for your mental health medications  [facility]  We checked 2 labs today, BMET and HgbA1C.  We will call with lab results.   Hypertension/High Blood pressure  Continue Amlodipine 10mg  daily  Continue Atenolol 50mg  twice daily  Change Lasix 20mg  to once daily  Follow up with your heart doctor, Dr. Sharyn LullHarwani on 05/15/14 as planned  Diabetes type 2 uncontrolled  Your HgbA1C marker is 9.1% today, not at goal  Change Metformin to 500 mg twice daily given abnormal kidney lab in January  Change Januvia to 50mg  once daily given abnormal kidney lab in January  Begin Lantus 10 units at bedtime daily  Check morning fasting glucose  Recheck here in 2 weeks  Eat 3 meals daily, including vegetable, a small grain portion, and a small fruit each meal.  Don't skip meals  Avoid sweets, cakes, pies, soda, sweet tea  For stomach pain  Continue Sucralfate 1gm tablet, 15 minutes before meals and before bedtime  Continue Pantoprazole 40mg  once daily in the morning, 30 minutes before breakfast  Follow up as planned with Dr. Bosie ClosSchooler  Depression and anxiety  Continue Sertraline 100mg  daily  Trazodone 100mg  daily at bedtime for sleep  Palipderidone 6mg  daily at bedtime  Clonzaepam 0.5mg  daily at bedtime  Return in 2 weeks, but if you need to return sooner, that is fine.

## 2014-05-07 NOTE — Progress Notes (Addendum)
Subjective: Here as new patient today.  Was seeing Dr. Parke SimmersBland prior. Her daughter Whitney Museshley [place] wanted her to come here.  Morrie Sheldonshley her daughter and her young grand daughter Carolynn SayersZamyia lives with her.  She is here alone today, seems very nervous, thoughts all over the place, provides history but easily gets off track with answering my questions.  She has her medications with her but says her nurse aid and daughter are the one's who gives her medication, thus she is not really sure what medications she is taking.   Medical team (from what I can gather today):  Prior PCM Dr. Ocie BobVita Bland  Dr. Sharyn LullHarwani, cardiology  Dr. Charlott RakesVincent Schooler, GI with [facility]  She is tearful stating that her main concern is getting her mind back, had nervous breakdown recently.   Has been dealing with a lot of stress, can't sleep, has a lot of anxiety, and feels like people don't let her speak for her self, speak to her like she doesn't have her mind right.  Sees [facility], sees them monthly.  She reports tooth decay, diffuse, suppose to have her remaining teeth pulled, but needs clearance regarding BP begin recently elevated.  She notes BPs have been high but can't tell me specifics.  She is also scheduled to have colonoscopy soon with Dr. Charlott RakesVincent Schooler at [facility].  Has diabetes, HTN, on several medications, and per her medication bottles [facility] [address] [facility], [facility] [address], and [facility] for her psychiatric medications.   There are several different providers listed on her medication bottles.  Her sugar gets checked at home but she has no idea what her numbers are and just says they are high. Similar, BPs are high per her report.    Current regimen: Diabetes - metformin 1000mg  BID, recently had Januvia 100mg  added. Was on insulin years ago HTN - atenolol 50mg  BID, amlodipine 10mg  daily, lasix 20mg  BID Taking protonix 40mg  daily, sucralfate 1gm QID Takes 4 psychiatric  medications as listed below unchanged  Past Medical History  Diagnosis Date  . Anxiety   . GERD (gastroesophageal reflux disease)   . Bipolar 1 disorder   . Diabetes mellitus 2010  . Hypertension 2010  . Depression   . PTSD (post-traumatic stress disorder)   . Anemia   . Obesity   . Tobacco use    ROS as in subjective  Objective: BP 130/80 mmHg  Pulse 78  Temp(Src) 98 F (36.7 C) (Oral)  Resp 14  Ht 5\' 3"  (1.6 m)  Wt 216 lb (97.977 kg)  BMI 38.27 kg/m2  Gen: wd, wn, obese AA female Skin: warm, dry Poor dentition Psych: seems nervous, seems slow to process information today, but does eventually answer questions appropriately Heart: RRR, normal s1, s2, no murmurs Lungs clear Ext: no edema Pulses normal    Assessment: Encounter Diagnoses  Name Primary?  . Diabetes type 2, uncontrolled Yes  . Essential hypertension   . Anxiety and depression   . Bipolar I disorder, most recent episode (or current) unspecified   . Poor dentition   . Edema   . Hypokalemia   . Special screening for malignant neoplasms, colon    Plan: It was difficult sorting through her history, medications, and concerns today.  [facility] medications, but can't get through to pharmacy, got cut off twice.  [facility].   Advised she bring daughter or aide with  her next visit in 2wk.   [facility] and just stick with one pharmacy.  She has f/u with cardiology on 05/15/14.  She has f/u scheduled with psyhciatry.  We made several changes below, instructions reviewed with patient.  Demonstrated use of Lantus.  HgbA1C 9.1% today.  BMET lab today.   reviewed 02/2014 hospitalization visit.    Patient Instructions  I strongly recommend you bring your aid or daughter with you to appointments  I strongly recommend you only use [facility] for your routine medications, and [facility] for your mental health medications  I would stop  [facility]  We checked 2 labs today, BMET and HgbA1C.  We will call with lab results.   Hypertension/High Blood pressure  Continue Amlodipine  daily  Continue Atenolol  twice daily  Change Lasix  to once daily  Follow up with your heart doctor, Dr. Sharyn Lull on 05/15/14 as planned  Diabetes type 2 uncontrolled  Your HgbA1C marker is 9.1% today, not at goal  Change Metformin to 500 mg twice daily given abnormal kidney lab in January  Change Januvia to  once daily given abnormal kidney lab in January  Begin Lantus 10 units at bedtime daily  Check morning fasting glucose  Recheck here in 2 weeks  Eat 3 meals daily, including vegetable, a small grain portion, and a small fruit each meal.  Don't skip meals  Avoid sweets, cakes, pies, soda, sweet tea  For stomach pain  Continue Sucralfate 1gm tablet, 15 minutes before meals and before bedtime  Continue Pantoprazole  once daily in the morning, 30 minutes before breakfast  Follow up as planned with Dr. Bosie Clos  Depression and anxiety  Continue Sertraline  daily  Trazodone  daily at bedtime for sleep  Palipderidone  daily at bedtime  Clonzaepam 0.5mg  daily at bedtime  Return in 2 weeks, but if you need to return sooner, that is fine.

## 2014-05-08 ENCOUNTER — Telehealth: Payer: Self-pay | Admitting: Medical

## 2014-05-08 ENCOUNTER — Other Ambulatory Visit: Payer: Self-pay | Admitting: Medical

## 2014-05-08 MED ORDER — INSULIN DETEMIR 100 UNIT/ML ~~LOC~~ SOLN: 10.0000 [IU] | Freq: Every day | SUBCUTANEOUS | Status: DC

## 2014-05-08 NOTE — Telephone Encounter (Signed)
pls call and see if she and her daughter have reviewed back over my handout and medication changes . Did she successfully do the Lantus last night?    F/u 2wk.

## 2014-05-08 NOTE — Progress Notes (Signed)
Reported to patients daughter. They will get levemir and report how she did. Daughter does want to know if you can address her ?pancreatic? Issue that she mentioned to you at her last visit?

## 2014-05-08 NOTE — Telephone Encounter (Signed)
Done

## 2014-05-12 ENCOUNTER — Ambulatory Visit (INDEPENDENT_AMBULATORY_CARE_PROVIDER_SITE_OTHER): Payer: Medicare Other | Admitting: Medical

## 2014-05-12 ENCOUNTER — Other Ambulatory Visit: Payer: Self-pay | Admitting: Gastroenterology

## 2014-05-12 ENCOUNTER — Encounter: Payer: Self-pay | Admitting: Medical

## 2014-05-12 VITALS — BP 150/100 | HR 80 | Temp 98.4°F | Resp 14 | Wt 217.0 lb

## 2014-05-12 DIAGNOSIS — E1165 Type 2 diabetes mellitus with hyperglycemia: Secondary | ICD-10-CM

## 2014-05-12 DIAGNOSIS — I10 Essential (primary) hypertension: Secondary | ICD-10-CM

## 2014-05-12 DIAGNOSIS — IMO0002 Reserved for concepts with insufficient information to code with codable children: Secondary | ICD-10-CM

## 2014-05-12 MED ORDER — OLMESARTAN MEDOXOMIL 40 MG PO TABS: 40.0000 mg | ORAL_TABLET | Freq: Every day | ORAL | Status: DC

## 2014-05-12 NOTE — Progress Notes (Signed)
Subjective: She just established here within the past 2 wk as a new patient.   Here today mainly due to elevated BPs which has her worried.  At her last visit we changed Lasix to once daily, we added Lantus for diabetes and kept all other medications unchanged.  Last visit was a challenge just to document her history, ascertain appropriate history and to achieve medication reconciliation.   She is less distressed today, seems to be doing ok with the new diabetes med changes.  She is due to have colonoscopy soon and trying to get BPs down.  No other aggravating or relieving factors. No other complaint.  ROS as in subjective  Objective: BP 150/100 mmHg  Pulse 80  Temp(Src) 98.4 F (36.9 C) (Oral)  Resp 14  Wt 217 lb (98.431 kg)  Gen: wd,wn, nad Heart: RRR, normal s1, s2, no murmurs Lungs clear Ext: no edema Pulses normal    Assessment: Encounter Diagnoses  Name Primary?  . Essential hypertension Yes  . Diabetes type 2, uncontrolled        Plan: We discussed her concerns.  Add benicar once daily, c/t all other changes and medications as outlined at last visit.  We reviewed the list below.  F/u 3-4 wk.  Patient Instructions   Continue all your routine medications from last visit  START Benicar 20mg  daily.  After 1 week, increase to 40mg  daily.    Patient Instructions  I strongly recommend you bring your aid or daughter with you to appointments  I strongly recommend you only use [facility] for your routine medications, and [facility] for your mental health medications  [facility]  We checked 2 labs today, BMET and HgbA1C. We will call with lab results.   Hypertension/High Blood pressure  Continue Amlodipine 10mg  daily  Continue Atenolol 50mg  twice daily  Change Lasix 20mg  to once daily  Follow up with your heart doctor, Dr. Sharyn LullHarwani on 05/15/14 as planned  Diabetes type 2 uncontrolled  Your HgbA1C marker is 9.1% today, not at goal  Change  Metformin to 500 mg twice daily given abnormal kidney lab in January  Change Januvia to 50mg  once daily given abnormal kidney lab in January  Begin Lantus 10 units at bedtime daily  Check morning fasting glucose  Recheck here in 2 weeks  Eat 3 meals daily, including vegetable, a small grain portion, and a small fruit each meal.  Don't skip meals  Avoid sweets, cakes, pies, soda, sweet tea  For stomach pain  Continue Sucralfate 1gm tablet, 15 minutes before meals and before bedtime  Continue Pantoprazole 40mg  once daily in the morning, 30 minutes before breakfast  Follow up as planned with Dr. Bosie ClosSchooler  Depression and anxiety  Continue Sertraline 100mg  daily  Trazodone 100mg  daily at bedtime for sleep  Palipderidone 6mg  daily at bedtime  Clonzaepam 0.5mg  daily at bedtime  Return in 2 weeks, but if you need to return sooner, that is fine

## 2014-05-12 NOTE — Patient Instructions (Signed)
Continue all your routine medications from last visit  START Benicar 20mg  daily.  After 1 week, increase to 40mg  daily.    Patient Instructions  I strongly recommend you bring your aid or daughter with you to appointments  I strongly recommend you only use [facility] for your routine medications, and [facility] for your mental health medications  [facility]  We checked 2 labs today, BMET and HgbA1C. We will call with lab results.   Hypertension/High Blood pressure  Continue Amlodipine 10mg  daily  Continue Atenolol 50mg  twice daily  Change Lasix 20mg  to once daily  Follow up with your heart doctor, Dr. Sharyn LullHarwani on 05/15/14 as planned  Diabetes type 2 uncontrolled  Your HgbA1C marker is 9.1% today, not at goal  Change Metformin to 500 mg twice daily given abnormal kidney lab in January  Change Januvia to 50mg  once daily given abnormal kidney lab in January  Begin Lantus 10 units at bedtime daily  Check morning fasting glucose  Recheck here in 2 weeks  Eat 3 meals daily, including vegetable, a small grain portion, and a small fruit each meal.  Don't skip meals  Avoid sweets, cakes, pies, soda, sweet tea  For stomach pain  Continue Sucralfate 1gm tablet, 15 minutes before meals and before bedtime  Continue Pantoprazole 40mg  once daily in the morning, 30 minutes before breakfast  Follow up as planned with Dr. Bosie ClosSchooler  Depression and anxiety  Continue Sertraline 100mg  daily  Trazodone 100mg  daily at bedtime for sleep  Palipderidone 6mg  daily at bedtime  Clonzaepam 0.5mg  daily at bedtime  Return in 2 weeks, but if you need to return sooner, that is fine

## 2014-05-13 ENCOUNTER — Encounter (HOSPITAL_COMMUNITY): Payer: Self-pay | Admitting: *Deleted

## 2014-05-13 NOTE — Progress Notes (Signed)
Pt denies SOB and chest pain but stated that she is under the care of Dr Sharyn LullHarwani, cardiology. Pt made aware to stop Stop taking Aspirin, vitamins, and herbal medications. Do not take any NSAIDs ie: Ibuprofen, Advil, Naproxen or any medication containing Aspirin. Pt made aware to not take any diabetic medications the morning of procedure. Pt made aware to bring inhaler on DOS. Pt verbalized understanding of all pre-op instructions.

## 2014-05-14 ENCOUNTER — Ambulatory Visit (HOSPITAL_COMMUNITY): Payer: Medicare Other | Admitting: Anesthesiology

## 2014-05-14 ENCOUNTER — Ambulatory Visit (HOSPITAL_COMMUNITY)
Admission: RE | Admit: 2014-05-14 | Discharge: 2014-05-14 | Disposition: A | Discharge status: Home / Self Care | Payer: Medicare Other | Source: Ambulatory Visit | Attending: Gastroenterology | Admitting: Gastroenterology

## 2014-05-14 ENCOUNTER — Encounter (HOSPITAL_COMMUNITY): Payer: Self-pay | Admitting: *Deleted

## 2014-05-14 ENCOUNTER — Encounter (HOSPITAL_COMMUNITY)
Admission: RE | Disposition: A | Discharge status: Home / Self Care | Payer: Self-pay | Source: Ambulatory Visit | Attending: Gastroenterology

## 2014-05-14 DIAGNOSIS — F419 Anxiety disorder, unspecified: Secondary | ICD-10-CM | POA: Insufficient documentation

## 2014-05-14 DIAGNOSIS — F209 Schizophrenia, unspecified: Secondary | ICD-10-CM | POA: Diagnosis not present

## 2014-05-14 DIAGNOSIS — F329 Major depressive disorder, single episode, unspecified: Secondary | ICD-10-CM | POA: Insufficient documentation

## 2014-05-14 DIAGNOSIS — K219 Gastro-esophageal reflux disease without esophagitis: Secondary | ICD-10-CM | POA: Insufficient documentation

## 2014-05-14 DIAGNOSIS — K648 Other hemorrhoids: Secondary | ICD-10-CM | POA: Diagnosis not present

## 2014-05-14 DIAGNOSIS — Z9861 Coronary angioplasty status: Secondary | ICD-10-CM | POA: Insufficient documentation

## 2014-05-14 DIAGNOSIS — Z6838 Body mass index (BMI) 38.0-38.9, adult: Secondary | ICD-10-CM | POA: Diagnosis not present

## 2014-05-14 DIAGNOSIS — F4310 Post-traumatic stress disorder, unspecified: Secondary | ICD-10-CM | POA: Insufficient documentation

## 2014-05-14 DIAGNOSIS — I10 Essential (primary) hypertension: Secondary | ICD-10-CM | POA: Insufficient documentation

## 2014-05-14 DIAGNOSIS — E669 Obesity, unspecified: Secondary | ICD-10-CM | POA: Diagnosis not present

## 2014-05-14 DIAGNOSIS — E119 Type 2 diabetes mellitus without complications: Secondary | ICD-10-CM | POA: Insufficient documentation

## 2014-05-14 DIAGNOSIS — J189 Pneumonia, unspecified organism: Secondary | ICD-10-CM | POA: Insufficient documentation

## 2014-05-14 DIAGNOSIS — B3781 Candidal esophagitis: Secondary | ICD-10-CM | POA: Diagnosis not present

## 2014-05-14 DIAGNOSIS — R197 Diarrhea, unspecified: Secondary | ICD-10-CM | POA: Diagnosis present

## 2014-05-14 DIAGNOSIS — D649 Anemia, unspecified: Secondary | ICD-10-CM | POA: Insufficient documentation

## 2014-05-14 DIAGNOSIS — R1084 Generalized abdominal pain: Secondary | ICD-10-CM

## 2014-05-14 DIAGNOSIS — R1110 Vomiting, unspecified: Secondary | ICD-10-CM | POA: Diagnosis present

## 2014-05-14 DIAGNOSIS — F17210 Nicotine dependence, cigarettes, uncomplicated: Secondary | ICD-10-CM | POA: Insufficient documentation

## 2014-05-14 DIAGNOSIS — Z794 Long term (current) use of insulin: Secondary | ICD-10-CM | POA: Diagnosis not present

## 2014-05-14 DIAGNOSIS — F319 Bipolar disorder, unspecified: Secondary | ICD-10-CM | POA: Diagnosis not present

## 2014-05-14 DIAGNOSIS — J449 Chronic obstructive pulmonary disease, unspecified: Secondary | ICD-10-CM | POA: Diagnosis not present

## 2014-05-14 DIAGNOSIS — I2510 Atherosclerotic heart disease of native coronary artery without angina pectoris: Secondary | ICD-10-CM | POA: Insufficient documentation

## 2014-05-14 DIAGNOSIS — R109 Unspecified abdominal pain: Secondary | ICD-10-CM | POA: Diagnosis not present

## 2014-05-14 DIAGNOSIS — Z9049 Acquired absence of other specified parts of digestive tract: Secondary | ICD-10-CM | POA: Insufficient documentation

## 2014-05-14 DIAGNOSIS — R112 Nausea with vomiting, unspecified: Secondary | ICD-10-CM | POA: Diagnosis not present

## 2014-05-14 DIAGNOSIS — Z90710 Acquired absence of both cervix and uterus: Secondary | ICD-10-CM | POA: Insufficient documentation

## 2014-05-14 HISTORY — DX: Gastritis, unspecified, without bleeding: K29.70

## 2014-05-14 HISTORY — DX: Headache: R51

## 2014-05-14 HISTORY — DX: Schizophrenia, unspecified: F20.9

## 2014-05-14 HISTORY — DX: Other specified postprocedural states: Z98.890

## 2014-05-14 HISTORY — DX: Nonscarring hair loss, unspecified: L65.9

## 2014-05-14 HISTORY — PX: ESOPHAGOGASTRODUODENOSCOPY (EGD) WITH PROPOFOL: SHX5813

## 2014-05-14 HISTORY — DX: Other specified postprocedural states: R11.2

## 2014-05-14 HISTORY — PX: COLONOSCOPY WITH PROPOFOL: SHX5780

## 2014-05-14 HISTORY — DX: Pneumonia, unspecified organism: J18.9

## 2014-05-14 HISTORY — DX: Atherosclerotic heart disease of native coronary artery without angina pectoris: I25.10

## 2014-05-14 HISTORY — DX: Headache, unspecified: R51.9

## 2014-05-14 HISTORY — DX: Chronic obstructive pulmonary disease, unspecified: J44.9

## 2014-05-14 HISTORY — PX: COLONOSCOPY: SHX174

## 2014-05-14 LAB — GLUCOSE, CAPILLARY: Glucose-Capillary: 198 mg/dL — ABNORMAL HIGH (ref 70–99)

## 2014-05-14 SURGERY — COLONOSCOPY WITH PROPOFOL
Anesthesia: Monitor Anesthesia Care

## 2014-05-14 MED ORDER — SODIUM CHLORIDE 0.9 % IV SOLN: INTRAVENOUS | Status: DC

## 2014-05-14 MED ORDER — MIDAZOLAM HCL 5 MG/5ML IJ SOLN
INTRAMUSCULAR | Status: DC | PRN
  Administered 2014-05-14 (×2): 1 mg via INTRAVENOUS

## 2014-05-14 MED ORDER — LACTATED RINGERS IV SOLN
INTRAVENOUS | Status: DC
  Administered 2014-05-14: 08:00:00 via INTRAVENOUS

## 2014-05-14 MED ORDER — LIDOCAINE HCL (CARDIAC) 20 MG/ML IV SOLN
INTRAVENOUS | Status: DC | PRN
  Administered 2014-05-14: 50 mg via INTRAVENOUS

## 2014-05-14 MED ORDER — FENTANYL CITRATE 0.05 MG/ML IJ SOLN
INTRAMUSCULAR | Status: DC | PRN
  Administered 2014-05-14 (×2): 50 ug via INTRAVENOUS

## 2014-05-14 MED ORDER — PROPOFOL INFUSION 10 MG/ML OPTIME
INTRAVENOUS | Status: DC | PRN
  Administered 2014-05-14: 120 ug/kg/min via INTRAVENOUS

## 2014-05-14 MED ORDER — LACTATED RINGERS IV SOLN
INTRAVENOUS | Status: DC | PRN
  Administered 2014-05-14: 09:00:00 via INTRAVENOUS

## 2014-05-14 MED ORDER — PROPOFOL 10 MG/ML IV BOLUS
INTRAVENOUS | Status: DC | PRN
  Administered 2014-05-14 (×4): 30 mg via INTRAVENOUS
  Administered 2014-05-14: 50 mg via INTRAVENOUS
  Administered 2014-05-14: 30 mg via INTRAVENOUS
  Administered 2014-05-14: 50 mg via INTRAVENOUS

## 2014-05-14 NOTE — Anesthesia Preprocedure Evaluation (Addendum)
Anesthesia Evaluation  Patient identified by MRN, date of birth, ID band Patient awake    Reviewed: Allergy & Precautions, NPO status , Patient's Chart, lab work & pertinent test results, reviewed documented beta blocker date and time   History of Anesthesia Complications (+) PONV and history of anesthetic complications  Airway Mallampati: II  TM Distance: >3 FB Neck ROM: Full    Dental no notable dental hx.    Pulmonary pneumonia -, COPDCurrent Smoker,  breath sounds clear to auscultation  Pulmonary exam normal       Cardiovascular hypertension, Pt. on medications and Pt. on home beta blockers + CAD Rhythm:Regular Rate:Normal     Neuro/Psych  Headaches, PSYCHIATRIC DISORDERS Anxiety Depression Bipolar Disorder Schizophrenia    GI/Hepatic Neg liver ROS, GERD-  ,  Endo/Other  diabetes, Type 2, Insulin Dependent  Renal/GU negative Renal ROS     Musculoskeletal negative musculoskeletal ROS (+)   Abdominal   Peds  Hematology negative hematology ROS (+) anemia ,   Anesthesia Other Findings   Reproductive/Obstetrics negative OB ROS                            Anesthesia Physical  Anesthesia Plan  ASA: III  Anesthesia Plan: MAC   Post-op Pain Management:    Induction: Intravenous  Airway Management Planned: Mask  Additional Equipment: None  Intra-op Plan:   Post-operative Plan: Extubation in OR  Informed Consent: I have reviewed the patients History and Physical, chart, labs and discussed the procedure including the risks, benefits and alternatives for the proposed anesthesia with the patient or authorized representative who has indicated his/her understanding and acceptance.   Dental advisory given  Plan Discussed with: CRNA  Anesthesia Plan Comments:         Anesthesia Quick Evaluation

## 2014-05-14 NOTE — Transfer of Care (Signed)
Immediate Anesthesia Transfer of Care Note  Patient: Cheyenne Alvarez  Procedure(s) Performed: Procedure(s): COLONOSCOPY WITH PROPOFOL (N/A) ESOPHAGOGASTRODUODENOSCOPY (EGD) WITH PROPOFOL (N/A)  Patient Location: PACU  Anesthesia Type:MAC  Level of Consciousness: awake, alert , oriented and sedated  Airway & Oxygen Therapy: Patient Spontanous Breathing and Patient connected to nasal cannula oxygen  Post-op Assessment: Report given to RN, Post -op Vital signs reviewed and stable and Patient moving all extremities  Post vital signs: Reviewed and stable  Last Vitals:  Filed Vitals:   05/14/14 0955  BP: 137/84  Pulse: 60  Temp: 36.7 C  Resp: 18    Complications: No apparent anesthesia complications

## 2014-05-14 NOTE — Discharge Instructions (Addendum)
Will call you when the pathology results are complete.Esophagogastroduodenoscopy Care After Refer to this sheet in the next few weeks. These instructions provide you with information on caring for yourself after your procedure. Your caregiver may also give you more specific instructions. Your treatment has been planned according to current medical practices, but problems sometimes occur. Call your caregiver if you have any problems or questions after your procedure.  HOME CARE INSTRUCTIONS  Do not eat or drink anything until the numbing medicine (local anesthetic) has worn off and your gag reflex has returned. You will know that the local anesthetic has worn off when you can swallow comfortably.  Do not drive for 12 hours after the procedure or as directed by your caregiver.  Only take medicines as directed by your caregiver. SEEK MEDICAL CARE IF:   You cannot stop coughing.  You are not urinating at all or less than usual. SEEK IMMEDIATE MEDICAL CARE IF:  You have difficulty swallowing.  You cannot eat or drink.  You have worsening throat or chest pain.  You have dizziness, lightheadedness, or you faint.  You have nausea or vomiting.  You have chills.  You have a fever.  You have severe abdominal pain.  You have black, tarry, or bloody stools. Document Released: 02/01/2012 Document Reviewed: 02/01/2012 [facility] Patient Information 2015 [place], [place]. This information is not intended to replace advice given to you by your health care provider. Make sure you discuss any questions you have with your health care provider. Colonoscopy, Care After These instructions give you information on caring for yourself after your procedure. Your doctor may also give you more specific instructions. Call your doctor if you have any problems or questions after your procedure. HOME CARE  Do not drive for 24 hours.  Do not sign important papers or use machinery for 24 hours.  You may  shower.  You may go back to your usual activities, but go slower for the first 24 hours.  Take rest breaks often during the first 24 hours.  Walk around or use warm packs on your belly (abdomen) if you have belly cramping or gas.  Drink enough fluids to keep your pee (urine) clear or pale yellow.  Resume your normal diet. Avoid heavy or fried foods.  Avoid drinking alcohol for 24 hours or as told by your doctor.  Only take medicines as told by your doctor. If a tissue sample (biopsy) was taken during the procedure:   Do not take aspirin or blood thinners for 7 days, or as told by your doctor.  Do not drink alcohol for 7 days, or as told by your doctor.  Eat soft foods for the first 24 hours. GET HELP IF: You still have a small amount of blood in your poop (stool) 2-3 days after the procedure. GET HELP RIGHT AWAY IF:  You have more than a small amount of blood in your poop.  You see clumps of tissue (blood clots) in your poop.  Your belly is puffy (swollen).  You feel sick to your stomach (nauseous) or throw up (vomit).  You have a fever.  You have belly pain that gets worse and medicine does not help. MAKE SURE YOU:  Understand these instructions.  Will watch your condition.  Will get help right away if you are not doing well or get worse. Document Released: 03/19/2010 Document Revised: 02/19/2013 Document Reviewed: 10/22/2012 [facility] Patient Information 2015 [place], [place]. This information is not intended to replace advice given to you  by your health care provider. Make sure you discuss any questions you have with your health care provider.  Take Diflucan 200 mg once today and then 100 mg once a day starting tomorrow for the next 9 days. Prescription will be called into your pharmacy.

## 2014-05-14 NOTE — Op Note (Signed)
[facility] [address] [place], [id number]   ENDOSCOPY PROCEDURE REPORT  PATIENT: Cheyenne Alvarez, Cheyenne Alvarez  MR#: [id number] BIRTHDATE: November 09, 1965 , 48  yrs. old GENDER: female ENDOSCOPIST: Charlott RakesVincent Markisha Meding, MD REFERRED BY:  Renaye RakersVeita Bland, [name] PROCEDURE DATE:  05/14/2014 PROCEDURE:  EGD w/ biopsy ASA CLASS:     Class III INDICATIONS:  abdominal pain, diarrhea, and vomiting. MEDICATIONS: Monitored anesthesia care and Per Anesthesia TOPICAL ANESTHETIC: none  DESCRIPTION OF PROCEDURE: After the risks benefits and alternatives of the procedure were thoroughly explained, informed consent was obtained.  The    endoscope was introduced through the mouth and advanced to the second portion of the duodenum , Without limitations.  The instrument was slowly withdrawn as the mucosa was fully examined. Estimated blood loss is zero unless otherwise noted in this procedure report.    Scattered small white plaques in esophagus consistent with minimal Candida esophagitis. GEJ normal and 36 cm from the incisors. Stomach normal. Duodenal bulb and 2nd portion of the duodenum normal in appearance and biopsies taken of the duodenum to check for celiac sprue.       Retroflexed views revealed no abnormalities.     The scope was then withdrawn from the patient and the procedure completed.  COMPLICATIONS: There were no immediate complications.  ENDOSCOPIC IMPRESSION:     Minimal Candida Esophagitis otherwise normal EGD  RECOMMENDATIONS:     F/U path; Start Diflucan 100 mg PO QD.    eSigned:  Charlott RakesVincent Aracelli Woloszyn, MD 05/14/2014 9:57 AM    CC:  CPT CODES: ICD CODES:  The ICD and CPT codes recommended by this software are interpretations from the data that the clinical staff has captured with the software.  The verification of the translation of this report to the ICD and CPT codes and modifiers is the sole responsibility of the health care institution and  practicing physician where this report was generated.  [employer] will not be held responsible for the validity of the ICD and CPT codes included on this report.  AMA assumes no liability for data contained or not contained herein. CPT is a Publishing rights managerregistered trademark of the [employer].  PATIENT NAME:  Cheyenne Alvarez, Cheyenne Alvarez MR#: [id number]

## 2014-05-14 NOTE — Op Note (Signed)
[facility] [address] [place], [id number]   COLONOSCOPY PROCEDURE REPORT     EXAM DATE: 05/14/2014  PATIENT NAME:      Cheyenne Alvarez, Cheyenne Alvarez           MR #:      [id number]  BIRTHDATE:       1965/07/17      VISIT #:     [id number]  ATTENDING:     Charlott RakesVincent Anjoli Diemer, MD     STATUS:     outpatient REFERRING MD:      Renaye RakersVeita Bland, [name] ASA CLASS:        Class III  INDICATIONS:  The patient is a 49 yr old female here for a colonoscopy due to chronic diarrhea and abdominal pain. PROCEDURE PERFORMED:     Colonoscopy with biopsy MEDICATIONS:     Monitored anesthesia care and Per Anesthesia  ESTIMATED BLOOD LOSS:     None  CONSENT: The patient understands the risks and benefits of the procedure and understands that these risks include, but are not limited to: sedation, allergic reaction, infection, perforation and/or bleeding. Alternative means of evaluation and treatment include, among others: physical exam, x-rays, and/or surgical intervention. The patient elects to proceed with this endoscopic procedure.  DESCRIPTION OF PROCEDURE: During intra-op preparation period all mechanical & medical equipment was checked for proper function. Hand hygiene and appropriate measures for infection prevention was taken. After the risks, benefits and alternatives of the procedure were thoroughly explained, Informed consent was verified, confirmed and timeout was successfully executed by the treatment team. A digital exam revealed no abnormalities of the rectum.      The Pentax Ped Colon [id number] endoscope was introduced through the anus and advanced to the cecum, which was identified by both the appendix and ileocecal valve. The prep was good.. The instrument was then slowly withdrawn as the colon was fully examined.Estimated blood loss is zero unless otherwise noted in this procedure report. Terminal ileum intubated and normal in appearance. No  mucosal abnormalities seen. Random colonic biopsies taken to check for microscopic colitis.      Retroflexed views revealed small internal hemorrhoids.  The scope was then completely withdrawn from the patient and the procedure terminated.     ADVERSE EVENTS:      There were no immediate complications.   IMPRESSIONS:     Small internal hemorrhoids otherwise normal colonoscopy  RECOMMENDATIONS:     F/U on path   Charlott RakesVincent Sandia Pfund, MD eSigned:  Charlott RakesVincent Alyssia Heese, MD 05/14/2014 10:03 AM   cc:  CPT CODES: ICD CODES:  The ICD and CPT codes recommended by this software are interpretations from the data that the clinical staff has captured with the software.  The verification of the translation of this report to the ICD and CPT codes and modifiers is the sole responsibility of the health care institution and practicing physician where this report was generated.  [employer] will not be held responsible for the validity of the ICD and CPT codes included on this report.  AMA assumes no liability for data contained or not contained herein. CPT is a Publishing rights managerregistered trademark of the [employer].

## 2014-05-14 NOTE — Addendum Note (Signed)
Addended by: Charlott RakesSCHOOLER, Dimetri Armitage on: 05/14/2014 08:41 AM   Modules accepted: Orders

## 2014-05-14 NOTE — Anesthesia Postprocedure Evaluation (Signed)
Anesthesia Post Note  Patient: Cheyenne Alvarez  Procedure(s) Performed: Procedure(s) (LRB): COLONOSCOPY WITH PROPOFOL (N/A) ESOPHAGOGASTRODUODENOSCOPY (EGD) WITH PROPOFOL (N/A)  Anesthesia type: MAC  Patient location: PACU  Post pain: Pain level controlled  Post assessment: Post-op Vital signs reviewed  Last Vitals: BP 143/70 mmHg  Pulse 56  Temp(Src) 36.7 C (Oral)  Resp 21  SpO2 100%  Post vital signs: Reviewed  Level of consciousness: awake  Complications: No apparent anesthesia complications

## 2014-05-14 NOTE — Interval H&P Note (Signed)
History and Physical Interval Note:  05/14/2014 8:55 AM  Cheyenne Alvarez  has presented today for surgery, with the diagnosis of abd pain , vomiting   The various methods of treatment have been discussed with the patient and family. After consideration of risks, benefits and other options for treatment, the patient has consented to  Procedure(s): COLONOSCOPY WITH PROPOFOL (N/A) ESOPHAGOGASTRODUODENOSCOPY (EGD) WITH PROPOFOL (N/A) as a surgical intervention .  [name] history has been reviewed, patient examined, no change in status, stable for surgery.  I have reviewed the patient's chart and labs.  Questions were answered to the patient's satisfaction.     Nandita Mathenia C.

## 2014-05-14 NOTE — H&P (Signed)
  Date of Initial H&P: 05/07/14  History reviewed, patient examined, no change in status, stable for surgery.

## 2014-05-15 ENCOUNTER — Encounter (HOSPITAL_COMMUNITY): Payer: Self-pay | Admitting: Gastroenterology

## 2014-05-15 ENCOUNTER — Encounter: Payer: Self-pay | Admitting: Medical

## 2014-05-15 ENCOUNTER — Telehealth: Payer: Self-pay | Admitting: Medical

## 2014-05-15 NOTE — Telephone Encounter (Signed)
Dr. [name]'s her dentist called again. They sent over the form. They were informed that she may be required to have an office visit before clearance granted. I am sending form back in your folder. Please address this ASAP as this pt is in a lot of pain and the dentist would like to address this.

## 2014-05-15 NOTE — Telephone Encounter (Signed)
Send letter back to her oral surgeon

## 2014-05-15 NOTE — Telephone Encounter (Signed)
Please call  Dr. Barbette MerinoJensen, her dentist is going to be faxing form to you for approval for patient to have her teeth pulled She also wants you to know her surgery went well yesterday

## 2014-05-15 NOTE — Telephone Encounter (Signed)
Kathy, not sure iOlegario Messierf you received this response from [place].

## 2014-05-16 NOTE — Telephone Encounter (Signed)
Form was located on assistant's desk and faxed over to Designer, industrial/productdental surgeon.

## 2014-05-19 ENCOUNTER — Other Ambulatory Visit (HOSPITAL_COMMUNITY): Payer: Self-pay | Admitting: Cardiology

## 2014-05-19 DIAGNOSIS — K219 Gastro-esophageal reflux disease without esophagitis: Secondary | ICD-10-CM | POA: Diagnosis not present

## 2014-05-19 DIAGNOSIS — R079 Chest pain, unspecified: Secondary | ICD-10-CM

## 2014-05-19 DIAGNOSIS — E119 Type 2 diabetes mellitus without complications: Secondary | ICD-10-CM | POA: Diagnosis not present

## 2014-05-19 DIAGNOSIS — I10 Essential (primary) hypertension: Secondary | ICD-10-CM | POA: Diagnosis not present

## 2014-05-19 DIAGNOSIS — I2510 Atherosclerotic heart disease of native coronary artery without angina pectoris: Secondary | ICD-10-CM | POA: Diagnosis not present

## 2014-05-19 DIAGNOSIS — E669 Obesity, unspecified: Secondary | ICD-10-CM | POA: Diagnosis not present

## 2014-05-19 DIAGNOSIS — E785 Hyperlipidemia, unspecified: Secondary | ICD-10-CM | POA: Diagnosis not present

## 2014-05-19 DIAGNOSIS — F250 Schizoaffective disorder, bipolar type: Secondary | ICD-10-CM | POA: Diagnosis not present

## 2014-05-24 ENCOUNTER — Telehealth: Payer: Self-pay | Admitting: Medical

## 2014-05-29 ENCOUNTER — Telehealth: Payer: Self-pay | Admitting: Medical

## 2014-05-29 NOTE — Telephone Encounter (Signed)
Pt needs refill Lantus Solostar

## 2014-05-29 NOTE — Telephone Encounter (Signed)
P.A. Approved til 02/28/15, faxed pharmacy, pt informed

## 2014-05-29 NOTE — Telephone Encounter (Signed)
OK to RF

## 2014-05-30 ENCOUNTER — Other Ambulatory Visit: Payer: Self-pay | Admitting: Medical

## 2014-05-30 MED ORDER — INSULIN GLARGINE 100 UNIT/ML SOLOSTAR PEN: 10.0000 [IU] | PEN_INJECTOR | Freq: Every day | SUBCUTANEOUS | Status: DC

## 2014-06-03 ENCOUNTER — Encounter: Payer: Self-pay | Admitting: Medical

## 2014-06-03 ENCOUNTER — Ambulatory Visit (INDEPENDENT_AMBULATORY_CARE_PROVIDER_SITE_OTHER): Payer: Medicare Other | Admitting: Medical

## 2014-06-03 VITALS — BP 102/80 | HR 70 | Resp 14 | Wt 216.0 lb

## 2014-06-03 DIAGNOSIS — I10 Essential (primary) hypertension: Secondary | ICD-10-CM | POA: Diagnosis not present

## 2014-06-03 DIAGNOSIS — Z720 Tobacco use: Secondary | ICD-10-CM

## 2014-06-03 DIAGNOSIS — IMO0002 Reserved for concepts with insufficient information to code with codable children: Secondary | ICD-10-CM

## 2014-06-03 DIAGNOSIS — E1165 Type 2 diabetes mellitus with hyperglycemia: Secondary | ICD-10-CM | POA: Diagnosis not present

## 2014-06-03 DIAGNOSIS — J449 Chronic obstructive pulmonary disease, unspecified: Secondary | ICD-10-CM | POA: Diagnosis not present

## 2014-06-03 DIAGNOSIS — R609 Edema, unspecified: Secondary | ICD-10-CM | POA: Diagnosis not present

## 2014-06-03 DIAGNOSIS — F17200 Nicotine dependence, unspecified, uncomplicated: Secondary | ICD-10-CM

## 2014-06-03 MED ORDER — INSULIN GLARGINE 100 UNIT/ML SOLOSTAR PEN: 15.0000 [IU] | PEN_INJECTOR | Freq: Every day | SUBCUTANEOUS | Status: DC

## 2014-06-03 MED ORDER — METFORMIN HCL 500 MG PO TABS: 500.0000 mg | ORAL_TABLET | Freq: Two times a day (BID) | ORAL | Status: DC

## 2014-06-03 MED ORDER — BUDESONIDE-FORMOTEROL FUMARATE 160-4.5 MCG/ACT IN AERO: 2.0000 | INHALATION_SPRAY | Freq: Two times a day (BID) | RESPIRATORY_TRACT | Status: DC

## 2014-06-03 MED ORDER — SITAGLIPTIN PHOSPHATE 50 MG PO TABS: 50.0000 mg | ORAL_TABLET | Freq: Every day | ORAL | Status: DC

## 2014-06-03 MED ORDER — OLMESARTAN MEDOXOMIL 40 MG PO TABS: 40.0000 mg | ORAL_TABLET | Freq: Every day | ORAL | Status: DC

## 2014-06-03 MED ORDER — EXENATIDE ER 2 MG ~~LOC~~ PEN: 2.0000 mg | PEN_INJECTOR | SUBCUTANEOUS | Status: DC

## 2014-06-03 NOTE — Progress Notes (Signed)
Subjective: Here for f/o on diabetes, HTN, recent visits.  She is a relatively new patient for me.  At her first visit here in March we adjusted some medications and overall she feels like she is improving, feeling better in general.  Hypertension - she is compliant with amlodipine 10mg  daily, atenolol 50mg  BID, benicar and went back to 2 lasix daily givne fluid/edema.  She has seen cardiology recently ad has stress test upcoming this month.  BPs have been looking better though.   Diabetes type 2 - using Metformin 500mg  BID, Januvia 50mg  once daily, Lantus 10 u QHS.  Checking glucose in the mornings once daily fasting.   Been seeing 200s, lowest has been 180.  Last Saturday had a drop in sugar though.   Was in a store, felt weak, ate some candy, but didn't check glucose.    Since last visit had colonoscopy and EGD without issues.  No new c/o.  Objective: BP 102/80 mmHg  Pulse 70  Resp 14  Wt 216 lb (97.977 kg)  Wt Readings from Last 3 Encounters:  06/03/14 216 lb (97.977 kg)  05/12/14 217 lb (98.431 kg)  05/07/14 216 lb (97.977 kg)   BP Readings from Last 3 Encounters:  06/03/14 102/80  05/14/14 143/70  05/12/14 150/100   Gen: wd,wn, nad Heart: RRR, normal s1, s2, no murmurs Lungs clear Ext: no edema Pulses normal    Assessment: Encounter Diagnoses  Name Primary?  . Diabetes type 2, uncontrolled Yes  . Essential hypertension   . Edema      Plan: After reviewing her glucose readings, BP, make the changes as below.  We discussed the new medications, risks/benefits, need to c/t diabetic diet and work on checking glucose before every meal.  Nurse gave demonstration on proper use of Symbicort and Bydureon.     Hypertension/High Blood pressure  Continue Amlodipine 10mg  daily  Continue Atenolol 50mg  twice daily  Continue Benicar 40mg  once daily  Continue Lasix 20mg , 1-2 tablets daily as depending upon swelling  Follow up with your heart doctor for stress test as  planned  Diabetes type 2 uncontrolled  continue Metformin 500 mg twice daily  continue Januvia 50mg  once daily   increase Lantus 15 units at bedtime daily  Begin Bydureon injection WEEKLY to help improve glucose numbers  Check glucose before meals and write these numbers down  Check glucose any time you feel it may be low.    COPD  BEGIN Symbicort preventative inhaler 2 puffs twice daily  Continue Albuterol rescue inhaler as needed every 4-6 hours

## 2014-06-03 NOTE — Patient Instructions (Addendum)
Hypertension/High Blood pressure  Continue Amlodipine 10mg  daily  Continue Atenolol 50mg  twice daily  Continue Benicar 40mg  once daily  Continue Lasix 20mg , 1-2 tablets daily as depending upon swelling  Follow up with your heart doctor for stress test as planned  Diabetes type 2 uncontrolled  continue Metformin 500 mg twice daily  continue Januvia 50mg  once daily   increase Lantus 15 units at bedtime daily  Begin Bydureon injection WEEKLY to help improve glucose numbers  Check glucose before meals and write these numbers down  Check glucose any time you feel it may be low.    COPD  BEGIN Symbicort preventative inhaler 2 puffs twice daily  Continue Albuterol rescue inhaler as needed every 4-6 hours  Low Blood Sugar Low blood sugar (hypoglycemia) means that the level of sugar in your blood is lower than it should be. Signs of low blood sugar include:  Getting sweaty.  Feeling hungry.  Feeling dizzy or weak.  Feeling sleepier than normal.  Feeling nervous.  Headaches.  Having a fast heartbeat. Low blood sugar can happen fast and can be an emergency. Your doctor can do tests to check your blood sugar level. You can have low blood sugar and not have diabetes. HOME CARE  Check your blood sugar as told by your doctor. If it is less than 70 mg/dl or as told by your doctor, take 1 of the following:  3 to 4 glucose tablets.   cup clear juice.   cup soda pop, not diet.  1 cup milk.  5 to 6 hard candies.  Recheck blood sugar after 15 minutes. Repeat until it is at the right level.  Eat a snack if it is more than 1 hour until the next meal.  Only take medicine as told by your doctor.  Do not skip meals. Eat on time.  Do not drink alcohol except with meals.  Check your blood glucose before driving.  Check your blood glucose before and after exercise.  Always carry treatment with you, such as glucose pills.  Always wear a medical alert bracelet if you  have diabetes. GET HELP RIGHT AWAY IF:   Your blood glucose goes below 70 mg/dl or as told by your doctor, and you:  Are confused.  Are not able to swallow.  Pass out (faint).  You cannot treat yourself. You may need someone to help you.  You have low blood sugar problems often.  You have problems from your medicines.  You are not feeling better after 3 to 4 days.  You have vision changes. MAKE SURE YOU:   Understand these instructions.  Will watch this condition.  Will get help right away if you are not doing well or get worse. Document Released: 05/11/2009 Document Revised: 05/09/2011 Document Reviewed: 05/11/2009 [facility] Patient Information 2015 [employer] [place]. This information is not intended to replace advice given to you by your health care provider. Make sure you discuss any questions you have with your health care provider.

## 2014-06-06 ENCOUNTER — Encounter (HOSPITAL_COMMUNITY): Payer: Medicare Other

## 2014-06-20 ENCOUNTER — Telehealth: Payer: Self-pay | Admitting: Medical

## 2014-06-20 NOTE — Telephone Encounter (Signed)
P.A. Theodis SatoInvokana approved til 06/20/15, faxed pharmacy, left message for pt

## 2014-06-23 ENCOUNTER — Other Ambulatory Visit: Payer: Self-pay | Admitting: Medical

## 2014-06-23 MED ORDER — FLUTICASONE-SALMETEROL 250-50 MCG/DOSE IN AEPB: 1.0000 | INHALATION_SPRAY | Freq: Two times a day (BID) | RESPIRATORY_TRACT | Status: DC

## 2014-06-23 NOTE — Telephone Encounter (Signed)
Advair sent.

## 2014-06-23 NOTE — Telephone Encounter (Signed)
P.A. Lantus approved til 02/28/15, faxed pharmacy & pt informed

## 2014-06-23 NOTE — Telephone Encounter (Signed)
P.A. Symbicort Denied, pt needs trial of Advair Diskus (which will be cheapest) or Breo Ellipta.  Can you switch?

## 2014-06-23 NOTE — Telephone Encounter (Signed)
Pt informed of change of medication

## 2014-07-01 ENCOUNTER — Ambulatory Visit: Payer: Medicare Other | Admitting: Medical

## 2014-07-03 ENCOUNTER — Ambulatory Visit: Payer: Medicare Other | Admitting: Medical

## 2014-07-08 ENCOUNTER — Ambulatory Visit (INDEPENDENT_AMBULATORY_CARE_PROVIDER_SITE_OTHER): Payer: Medicare Other | Admitting: Medical

## 2014-07-08 ENCOUNTER — Encounter: Payer: Self-pay | Admitting: Medical

## 2014-07-08 VITALS — BP 130/88 | HR 85 | Resp 14 | Wt 217.0 lb

## 2014-07-08 DIAGNOSIS — F319 Bipolar disorder, unspecified: Secondary | ICD-10-CM | POA: Diagnosis not present

## 2014-07-08 DIAGNOSIS — E119 Type 2 diabetes mellitus without complications: Secondary | ICD-10-CM | POA: Diagnosis not present

## 2014-07-08 DIAGNOSIS — R519 Headache, unspecified: Secondary | ICD-10-CM

## 2014-07-08 DIAGNOSIS — R51 Headache: Secondary | ICD-10-CM

## 2014-07-08 DIAGNOSIS — Z87891 Personal history of nicotine dependence: Secondary | ICD-10-CM | POA: Diagnosis not present

## 2014-07-08 DIAGNOSIS — I10 Essential (primary) hypertension: Secondary | ICD-10-CM | POA: Diagnosis not present

## 2014-07-08 DIAGNOSIS — M545 Low back pain, unspecified: Secondary | ICD-10-CM

## 2014-07-08 DIAGNOSIS — E1165 Type 2 diabetes mellitus with hyperglycemia: Secondary | ICD-10-CM | POA: Diagnosis not present

## 2014-07-08 DIAGNOSIS — IMO0002 Reserved for concepts with insufficient information to code with codable children: Secondary | ICD-10-CM

## 2014-07-08 LAB — CBC
HCT: 36.1 % (ref 36.0–46.0)
Hemoglobin: 11.7 g/dL — ABNORMAL LOW (ref 12.0–15.0)
MCH: 25.9 pg — ABNORMAL LOW (ref 26.0–34.0)
MCHC: 32.4 g/dL (ref 30.0–36.0)
MCV: 80 fL (ref 78.0–100.0)
MPV: 9.6 fL (ref 8.6–12.4)
PLATELETS: 400 10*3/uL (ref 150–400)
RBC: 4.51 MIL/uL (ref 3.87–5.11)
RDW: 15.7 % — AB (ref 11.5–15.5)
WBC: 11.4 10*3/uL — AB (ref 4.0–10.5)

## 2014-07-08 LAB — POCT URINALYSIS DIPSTICK
Bilirubin, UA: NEGATIVE
Blood, UA: NEGATIVE
GLUCOSE UA: NEGATIVE
Ketones, UA: NEGATIVE
LEUKOCYTES UA: NEGATIVE
Nitrite, UA: NEGATIVE
Spec Grav, UA: >=1.03
UROBILINOGEN UA: NEGATIVE
pH, UA: 6

## 2014-07-08 LAB — HEMOGLOBIN A1C
Hgb A1c MFr Bld: 8.4 % — ABNORMAL HIGH (ref <5.7)
Mean Plasma Glucose: 194 mg/dL — ABNORMAL HIGH (ref <117)

## 2014-07-08 NOTE — Patient Instructions (Signed)
Type 2 Diabetes  Diabetes is a long-lasting (chronic) disease.  With diabetes, either the pancreas does not make enough of a hormone called insulin, or the body has trouble using the insulin that is made.  Over time, diabetes can damage the eyes, kidneys, and nerves causing retinopathy, nephropathy, and neuropathy.  Diabetes puts you at risk for heart disease and peripheral vascular disease which can lead to heart attack, stroke, foot ulcers, and amputations.    Our goal and hopefully your goal is to manage your diabetes in such a way to slow the progression of the disease and do all we can to keep you healthy  Home Care:   Eat healthy, exercise regularly, limit alcohol, and don't smoke!  Check your blood sugar (glucose) once a day before breakfast, or as indicated by our discussion today.  Take your medications daily, don't run out of medications.  Learn about low blood sugar (hypoglycemia). Know how to treat it.  Wear a necklace or bracelet that says you have diabetes.  Check your feet every night for cuts, sores, blisters, and redness. Tell your medical provider if you have problems.  Maintain a normal body weight, or normal BMI - height to weight ratio of 20-25.  Ask me about this.  GET HELP RIGHT AWAY IF:  You have trouble keeping your blood sugar in target range.  You have problems with your medicines.  You are sick and not getting better after 24 hours.  You have a sore or wound that is not healing.  You have vision problems or changes.  You have a fever.  Diet: eat a healthy diabetic diet  Exercise regularly since it has beneficial effects on the heart and blood sugars. Exercising at least three times per week or 150 minutes per week can be as important as medication to a diabetic.  Find some form of exercise that you will enjoy doing regularly.  This can include walking, biking, kayaking, golfing, swimming, dance, aerobics, hiking, etc.  If you have joint problems,  many local gyms have equipment to accommodate people with specific needs.    Vaccinations:  Diabetics are at increased risk for infection, and illnesses can take longer to resolve.  Current vaccine recommendations include yearly Influenza (flu) vaccine (recommended in October), Pneumococcal vaccine, Hepatitis B vaccine series, Tdap (tetanus, diptheria and pertussis) vaccine every 10 years, and other age appropriate vaccinations.     Office visits:  We recommend routine medical care to make sure we are addressing prevention and issues as they arise.  Typically this could mean twice yearly or up to quarterly depending upon your unique health situation.  Exams should include a yearly physical, a yearly foot exam, and other examination as appropriate.  You should see an eye doctor yearly to help screen for and prevent blood vessel complications in your eyes.  Labs: Diabetics should have blood work done at least twice yearly to monitor your Hemoglobin A1C (a three-month average of your blood sugars) and your cholesterol.  You should have your urine and blood checked yearly to screen for kidney damage.  This may include creatinine and micro-albumin levels.  Other labs as appropriate.    Blood pressure goals:  Goal blood pressure in diabetics should be 130/80 or less. Monitoring your blood pressure with a home blood pressure cuff of your own is an excellent idea.  If you are prescribed medication for blood pressure, take your medication every day, and don't run out of medication.  Having high blood  pressure can damage your heart, eyes, kidneys, and put you at risk for heart attack and stroke.  Tobacco use:  If you smoke, dip or chew, quitting will reduce your risk of heart attack, stroke, peripheral vascular disease, and many cancers.

## 2014-07-08 NOTE — Progress Notes (Signed)
Subjective: Chief Complaint  Patient presents with  . Follow-up    on medication (insulin added)   Here for f/u on chronic issues, med check.  Since last visit she continues on current medication  Hypertension/High Blood pressure - compliant with the following medications.  Been having some headaches.  Amlodipine 10mg  daily  Atenolol 50mg  twice daily  Benicar 40mg  once daily  Lasix 20mg , 1-2 tablets daily as depending upon swelling  Since last visit saw Dr. Sharyn LullHarwani, but hasn't done stress test yet  Diabetes type 2 uncontrolled Checking glucose, varied numbers, depending upon what she eats.  Fasting morning numbers usually 140s.  Compliant with the following:  Metformin 500 mg twice daily  Januvia 50mg  once daily   Lantus 15 units at bedtime daily  Bydureon injection WEEKLY to help improve glucose numbers  COPD - compliant with the following  Symbicort preventative inhaler 2 puffs twice daily  Albuterol rescue inhaler as needed every 4-6 hours  Bipolar, anxiety - [facility]  She notes this week having headaches.   Had real bad posterior headache yesterday.  Tongue feels numb at times, she attributes to the headaches. At times feels dizzy and off balanced.  Has had headache from time to time, but none regularly until recently.  Having some recent low back pain, no leg radiation, no injury, trauma or fall    Past Medical History  Diagnosis Date  . Anxiety   . GERD (gastroesophageal reflux disease)   . Bipolar 1 disorder   . Diabetes mellitus 2010  . Hypertension 2010  . Depression   . PTSD (post-traumatic stress disorder)   . Anemia   . Obesity   . Tobacco use   . PONV (postoperative nausea and vomiting)   . Gastritis   . Pneumonia   . Coronary artery disease   . COPD (chronic obstructive pulmonary disease)   . Schizophrenia   . Headache     migraines  . Alopecia    ROS as in subjective  Objective: BP 130/88 mmHg  Pulse 85  Resp  14  Wt 217 lb (98.431 kg)  General appearance: alert, no distress, WD/WN, obese AA female HEENT: normocephalic, sclerae anicteric, PERRLA, EOMi, nares patent, no discharge or erythema, pharynx normal Oral cavity: MMM, no lesions Neck: supple, no lymphadenopathy, no thyromegaly, no masses, no bruits Heart: RRR, normal S1, S2, no murmurs Lungs: CTA bilaterally, no wheezes, rhonchi, or rales Abdomen: +bs, soft, non tender, non distended, no masses, no hepatomegaly, no splenomegaly Back: tender throughout paraspinal low back, no scoliosis, ROM relatively normal Musculoskeletal: nontender, no swelling, no obvious deformity Extremities: no edema, no cyanosis, no clubbing Pulses: 2+ symmetric, upper and lower extremities, normal cap refill Neurological: alert, oriented x 3, CN2-12 intact, strength normal upper extremities and lower extremities, sensation normal throughout, DTRs 2+ throughout, no cerebellar signs, gait normal, monofilament exam normal Psychiatric: normal affect, behavior normal, pleasant    Assessment: Encounter Diagnoses  Name Primary?  . Diabetes type 2, uncontrolled Yes  . Essential hypertension   . Bipolar affective disorder, most recent episode unspecified type, remission status unspecified   . Former smoker   . Bilateral low back pain without sciatica   . Intractable episodic headache, unspecified headache type     Plan: C/t current medications, labs today, discussed possibly adding statin.  discussed possible headache and back pain causes and triggers.  May need other treatment or recommendations pending labs.   Overall she is doing much better with medications for BP  and diabetes.   Compliance is good.  F/u pending labs

## 2014-07-09 ENCOUNTER — Other Ambulatory Visit: Payer: Self-pay | Admitting: Medical

## 2014-07-09 LAB — LIPID PANEL
Cholesterol: 197 mg/dL (ref 0–200)
HDL: 49 mg/dL (ref >=46)
LDL CALC: 131 mg/dL — AB (ref 0–99)
TRIGLYCERIDES: 83 mg/dL (ref <150)
Total CHOL/HDL Ratio: 4 Ratio
VLDL: 17 mg/dL (ref 0–40)

## 2014-07-09 LAB — COMPREHENSIVE METABOLIC PANEL
ALT: 9 U/L (ref 0–35)
AST: 9 U/L (ref 0–37)
Albumin: 3.5 g/dL (ref 3.5–5.2)
Alkaline Phosphatase: 78 U/L (ref 39–117)
BILIRUBIN TOTAL: 0.3 mg/dL (ref 0.2–1.2)
BUN: 8 mg/dL (ref 6–23)
CHLORIDE: 105 meq/L (ref 96–112)
CO2: 20 meq/L (ref 19–32)
Calcium: 9 mg/dL (ref 8.4–10.5)
Creat: 0.77 mg/dL (ref 0.50–1.10)
Glucose, Bld: 142 mg/dL — ABNORMAL HIGH (ref 70–99)
Potassium: 4.5 mEq/L (ref 3.5–5.3)
Sodium: 137 mEq/L (ref 135–145)
Total Protein: 6.8 g/dL (ref 6.0–8.3)

## 2014-07-09 LAB — MICROALBUMIN / CREATININE URINE RATIO
Creatinine, Urine: 482.8 mg/dL
Microalb Creat Ratio: 11.2 mg/g (ref 0.0–30.0)
Microalb, Ur: 5.4 mg/dL — ABNORMAL HIGH (ref <2.0)

## 2014-07-09 LAB — TSH: TSH: 0.975 u[IU]/mL (ref 0.350–4.500)

## 2014-07-09 MED ORDER — EXENATIDE ER 2 MG ~~LOC~~ PEN: 2.0000 mg | PEN_INJECTOR | SUBCUTANEOUS | Status: DC

## 2014-07-09 MED ORDER — INSULIN GLARGINE 100 UNIT/ML SOLOSTAR PEN: 20.0000 [IU] | PEN_INJECTOR | Freq: Every day | SUBCUTANEOUS | Status: DC

## 2014-07-09 MED ORDER — ROSUVASTATIN CALCIUM 10 MG PO TABS: 10.0000 mg | ORAL_TABLET | Freq: Every day | ORAL | Status: DC

## 2014-07-09 MED ORDER — SITAGLIPTIN PHOS-METFORMIN HCL 50-1000 MG PO TABS: 1.0000 | ORAL_TABLET | Freq: Two times a day (BID) | ORAL | Status: DC

## 2014-07-25 ENCOUNTER — Telehealth: Payer: Self-pay | Admitting: *Deleted

## 2014-07-25 NOTE — Telephone Encounter (Signed)
Went over these instruction with patient in detail-patient verbalized understanding.

## 2014-07-25 NOTE — Telephone Encounter (Signed)
Patient called and states that she has not really been giving herself her lantus injections, thought there was a needle on but there actually was not. She did notice this Wed and DID give herself the shot yesterday and today. She gave herself injection at 8:00am, had her breakfast at 9:30am and she just checked and blood sugar is 366-wants to know how to get her sugars down.

## 2014-07-25 NOTE — Telephone Encounter (Signed)
Continue to take the injections once daily; check blood sugars regularly, keep a record and follow up with [facility] next week if they remain high. It might take some time for them to come down, if she hasn't been taking the insulin.  There is a higher risk of complication by taking too much, then giving it a little time.  If she is feeling bad (headaches, vomiting, severe abdominal pain, mental status changes, etc) then consider going to ER over the holiday weekend

## 2014-07-29 ENCOUNTER — Telehealth: Payer: Self-pay | Admitting: Medical
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
## 2014-07-29 NOTE — Telephone Encounter (Signed)
I [facility] from [facility] to complete an Attestation of Medical Need.  What are they suggesting we put on the form for medical diagnosis to warrant the need?    She is ambulatory, and daughter lives with her.  So I am not completley sure what the issue is.  Please inquire?

## 2014-08-06 ENCOUNTER — Other Ambulatory Visit: Payer: Self-pay | Admitting: Medical

## 2014-08-26 DIAGNOSIS — F250 Schizoaffective disorder, bipolar type: Secondary | ICD-10-CM | POA: Diagnosis not present

## 2014-08-29 DIAGNOSIS — I2510 Atherosclerotic heart disease of native coronary artery without angina pectoris: Secondary | ICD-10-CM | POA: Diagnosis not present

## 2014-08-29 DIAGNOSIS — I10 Essential (primary) hypertension: Secondary | ICD-10-CM | POA: Diagnosis not present

## 2014-08-29 DIAGNOSIS — E669 Obesity, unspecified: Secondary | ICD-10-CM | POA: Diagnosis not present

## 2014-08-29 DIAGNOSIS — E785 Hyperlipidemia, unspecified: Secondary | ICD-10-CM | POA: Diagnosis not present

## 2014-08-29 DIAGNOSIS — E119 Type 2 diabetes mellitus without complications: Secondary | ICD-10-CM | POA: Diagnosis not present

## 2014-08-29 DIAGNOSIS — M1990 Unspecified osteoarthritis, unspecified site: Secondary | ICD-10-CM | POA: Diagnosis not present

## 2014-08-29 DIAGNOSIS — R079 Chest pain, unspecified: Secondary | ICD-10-CM | POA: Diagnosis not present

## 2014-08-29 DIAGNOSIS — K219 Gastro-esophageal reflux disease without esophagitis: Secondary | ICD-10-CM | POA: Diagnosis not present

## 2014-08-30 ENCOUNTER — Encounter (HOSPITAL_COMMUNITY): Payer: Self-pay | Admitting: *Deleted

## 2014-08-30 ENCOUNTER — Emergency Department (HOSPITAL_COMMUNITY): Payer: Medicare Other

## 2014-08-30 ENCOUNTER — Emergency Department (HOSPITAL_COMMUNITY)
Admission: EM | Admit: 2014-08-30 | Discharge: 2014-08-31 | Disposition: A | Discharge status: Home / Self Care | Payer: Medicare Other | Attending: Emergency Medicine | Admitting: Emergency Medicine

## 2014-08-30 DIAGNOSIS — K5900 Constipation, unspecified: Secondary | ICD-10-CM | POA: Diagnosis present

## 2014-08-30 DIAGNOSIS — K625 Hemorrhage of anus and rectum: Secondary | ICD-10-CM | POA: Diagnosis not present

## 2014-08-30 DIAGNOSIS — F4310 Post-traumatic stress disorder, unspecified: Secondary | ICD-10-CM | POA: Insufficient documentation

## 2014-08-30 DIAGNOSIS — Z9889 Other specified postprocedural states: Secondary | ICD-10-CM | POA: Insufficient documentation

## 2014-08-30 DIAGNOSIS — Z9049 Acquired absence of other specified parts of digestive tract: Secondary | ICD-10-CM | POA: Diagnosis not present

## 2014-08-30 DIAGNOSIS — J449 Chronic obstructive pulmonary disease, unspecified: Secondary | ICD-10-CM | POA: Insufficient documentation

## 2014-08-30 DIAGNOSIS — R109 Unspecified abdominal pain: Secondary | ICD-10-CM

## 2014-08-30 DIAGNOSIS — Z794 Long term (current) use of insulin: Secondary | ICD-10-CM | POA: Insufficient documentation

## 2014-08-30 DIAGNOSIS — F209 Schizophrenia, unspecified: Secondary | ICD-10-CM | POA: Diagnosis not present

## 2014-08-30 DIAGNOSIS — K219 Gastro-esophageal reflux disease without esophagitis: Secondary | ICD-10-CM | POA: Insufficient documentation

## 2014-08-30 DIAGNOSIS — Z79899 Other long term (current) drug therapy: Secondary | ICD-10-CM | POA: Diagnosis not present

## 2014-08-30 DIAGNOSIS — F319 Bipolar disorder, unspecified: Secondary | ICD-10-CM | POA: Diagnosis not present

## 2014-08-30 DIAGNOSIS — E119 Type 2 diabetes mellitus without complications: Secondary | ICD-10-CM | POA: Insufficient documentation

## 2014-08-30 DIAGNOSIS — Z872 Personal history of diseases of the skin and subcutaneous tissue: Secondary | ICD-10-CM | POA: Insufficient documentation

## 2014-08-30 DIAGNOSIS — F419 Anxiety disorder, unspecified: Secondary | ICD-10-CM | POA: Insufficient documentation

## 2014-08-30 DIAGNOSIS — Z90710 Acquired absence of both cervix and uterus: Secondary | ICD-10-CM | POA: Diagnosis not present

## 2014-08-30 DIAGNOSIS — I10 Essential (primary) hypertension: Secondary | ICD-10-CM | POA: Insufficient documentation

## 2014-08-30 DIAGNOSIS — E669 Obesity, unspecified: Secondary | ICD-10-CM | POA: Diagnosis not present

## 2014-08-30 DIAGNOSIS — N281 Cyst of kidney, acquired: Secondary | ICD-10-CM | POA: Diagnosis not present

## 2014-08-30 DIAGNOSIS — Z8701 Personal history of pneumonia (recurrent): Secondary | ICD-10-CM | POA: Diagnosis not present

## 2014-08-30 DIAGNOSIS — Z862 Personal history of diseases of the blood and blood-forming organs and certain disorders involving the immune mechanism: Secondary | ICD-10-CM | POA: Diagnosis not present

## 2014-08-30 DIAGNOSIS — Z9851 Tubal ligation status: Secondary | ICD-10-CM | POA: Insufficient documentation

## 2014-08-30 DIAGNOSIS — K5901 Slow transit constipation: Secondary | ICD-10-CM | POA: Insufficient documentation

## 2014-08-30 DIAGNOSIS — Z720 Tobacco use: Secondary | ICD-10-CM | POA: Diagnosis not present

## 2014-08-30 DIAGNOSIS — I2510 Atherosclerotic heart disease of native coronary artery without angina pectoris: Secondary | ICD-10-CM | POA: Insufficient documentation

## 2014-08-30 LAB — COMPREHENSIVE METABOLIC PANEL
ALT: 14 U/L (ref 14–54)
AST: 15 U/L (ref 15–41)
Albumin: 3.1 g/dL — ABNORMAL LOW (ref 3.5–5.0)
Alkaline Phosphatase: 88 U/L (ref 38–126)
Anion gap: 7 (ref 5–15)
BUN: 6 mg/dL (ref 6–20)
CO2: 26 mmol/L (ref 22–32)
CREATININE: 0.92 mg/dL (ref 0.44–1.00)
Calcium: 8.6 mg/dL — ABNORMAL LOW (ref 8.9–10.3)
Chloride: 101 mmol/L (ref 101–111)
GFR calc Af Amer: >60 mL/min (ref >60)
GFR calc non Af Amer: >60 mL/min (ref >60)
Glucose, Bld: 223 mg/dL — ABNORMAL HIGH (ref 65–99)
Potassium: 4.1 mmol/L (ref 3.5–5.1)
Sodium: 134 mmol/L — ABNORMAL LOW (ref 135–145)
TOTAL PROTEIN: 6.5 g/dL (ref 6.5–8.1)
Total Bilirubin: 0.2 mg/dL — ABNORMAL LOW (ref 0.3–1.2)

## 2014-08-30 LAB — CBC WITH DIFFERENTIAL/PLATELET
BASOS ABS: 0 10*3/uL (ref 0.0–0.1)
Basophils Relative: 0 % (ref 0–1)
EOS ABS: 0.2 10*3/uL (ref 0.0–0.7)
Eosinophils Relative: 2 % (ref 0–5)
HCT: 34.8 % — ABNORMAL LOW (ref 36.0–46.0)
Hemoglobin: 11.5 g/dL — ABNORMAL LOW (ref 12.0–15.0)
Lymphocytes Relative: 32 % (ref 12–46)
Lymphs Abs: 3.7 10*3/uL (ref 0.7–4.0)
MCH: 26.9 pg (ref 26.0–34.0)
MCHC: 33 g/dL (ref 30.0–36.0)
MCV: 81.3 fL (ref 78.0–100.0)
MONO ABS: 0.5 10*3/uL (ref 0.1–1.0)
MONOS PCT: 4 % (ref 3–12)
Neutro Abs: 7.1 10*3/uL (ref 1.7–7.7)
Neutrophils Relative %: 62 % (ref 43–77)
Platelets: 339 10*3/uL (ref 150–400)
RBC: 4.28 MIL/uL (ref 3.87–5.11)
RDW: 14.8 % (ref 11.5–15.5)
WBC: 11.5 10*3/uL — AB (ref 4.0–10.5)

## 2014-08-30 LAB — URINALYSIS, ROUTINE W REFLEX MICROSCOPIC
BILIRUBIN URINE: NEGATIVE
Glucose, UA: NEGATIVE mg/dL
HGB URINE DIPSTICK: NEGATIVE
Ketones, ur: NEGATIVE mg/dL
LEUKOCYTES UA: NEGATIVE
Nitrite: NEGATIVE
PROTEIN: NEGATIVE mg/dL
Specific Gravity, Urine: 1.02 (ref 1.005–1.030)
UROBILINOGEN UA: 0.2 mg/dL (ref 0.0–1.0)
pH: 6 (ref 5.0–8.0)

## 2014-08-30 LAB — LIPASE, BLOOD: Lipase: 31 U/L (ref 22–51)

## 2014-08-30 LAB — POC OCCULT BLOOD, ED: FECAL OCCULT BLD: POSITIVE — AB

## 2014-08-30 LAB — TROPONIN I

## 2014-08-30 MED ORDER — SODIUM CHLORIDE 0.9 % IV BOLUS (SEPSIS)
1000.0000 mL | Freq: Once | INTRAVENOUS | Status: AC
  Administered 2014-08-30: 1000 mL via INTRAVENOUS

## 2014-08-30 NOTE — ED Notes (Signed)
Patient transported to CT 

## 2014-08-30 NOTE — ED Notes (Signed)
Chest pain for 2 days °

## 2014-08-30 NOTE — ED Provider Notes (Signed)
CSN: [id number]     Arrival date & time 08/30/14  2049 History   First MD Initiated Contact with Patient 08/30/14 2206     Chief Complaint  Patient presents with  . constipated      (Consider location/radiation/quality/duration/timing/severity/associated sxs/prior Treatment) HPI   49 year old obese female with history of bipolar, anxiety, schizophrenia, diabetes presents for evaluation of constipation. This report for the past month she has had increased trouble moving a bowel movement. States in the past she usually uses bowel movement once daily but recently she is having trouble having a complete bowel movement. Her symptoms worsened the past 2 weeks. She noticed increased weight gain in her abdomen and a sensation of fullness. She reported decrease urge to have a bowel movement, and decreased flatus. Today she attempted to manually disimpact and was able to remove some stool still in the process she noticed blood draining from her rectum which concerns her. She denies any significant rectal pain. No fever, chills, nausea, vomiting, chest pain or shortness of breath, back pain, dysuria. Prior abdominal surgery including cholecystectomy, and tubal ligation.  She also reported having both endoscopy and colonoscopy earlier this year by Dr. school. She denies any prior history of diabetes.  Past Medical History  Diagnosis Date  . Anxiety   . GERD (gastroesophageal reflux disease)   . Bipolar 1 disorder   . Diabetes mellitus 2010  . Hypertension 2010  . Depression   . PTSD (post-traumatic stress disorder)   . Anemia   . Obesity   . Tobacco use   . PONV (postoperative nausea and vomiting)   . Gastritis   . Pneumonia   . Coronary artery disease   . COPD (chronic obstructive pulmonary disease)   . Schizophrenia   . Headache     migraines  . Alopecia    Past Surgical History  Procedure Laterality Date  . Abdominal hysterectomy    . Leg surgery    . Cholecystectomy    . Tubal  ligation    . Cardiac catheterization    . Colonoscopy    . Fracture surgery      left leg  . Colonoscopy with propofol N/A 05/14/2014    Procedure: COLONOSCOPY WITH PROPOFOL;  Surgeon: Charlott Rakes, MD;  Location: [facility] ENDOSCOPY;  Service: Endoscopy;  Laterality: N/A;  . Esophagogastroduodenoscopy (egd) with propofol N/A 05/14/2014    Procedure: ESOPHAGOGASTRODUODENOSCOPY (EGD) WITH PROPOFOL;  Surgeon: Charlott Rakes, MD;  Location: [facility] ENDOSCOPY;  Service: Endoscopy;  Laterality: N/A;   Family History  Problem Relation Age of Onset  . Diabetes Mother   . Kidney failure Mother   . Diabetes Other   . Cancer Other   . Hypertension Other    History  Substance Use Topics  . Smoking status: Current Some Day Smoker  . Smokeless tobacco: Never Used  . Alcohol Use: No   OB History    No data available     Review of Systems  All other systems reviewed and are negative.     Allergies  Dexilant; Famotidine; Meperidine hcl; Gabapentin; Ramipril; Invokana; Iohexol; and Tape  Home Medications   Prior to Admission medications   Medication Sig Start Date End Date Taking? Authorizing Provider  albuterol (PROVENTIL HFA;VENTOLIN HFA) 108 (90 BASE) MCG/ACT inhaler Inhale 2 puffs into the lungs every 6 (six) hours as needed for wheezing. For wheezing    Historical Provider, MD  amLODipine (NORVASC) 10 MG tablet Take 1 tablet (10 mg total) by mouth daily. 05/07/14  Kermit Baloavid S Tysinger, PA-C  atenolol (TENORMIN) 50 MG tablet take 1 tablet by mouth twice a day 08/06/14   Kermit Baloavid S Tysinger, PA-C  clonazePAM (KLONOPIN) 0.5 MG tablet Take 0.5 mg by mouth 3 (three) times daily as needed for anxiety.     Historical Provider, MD  diphenhydrAMINE (BENADRYL) 25 mg capsule Take 50 mg by mouth at bedtime.    Historical Provider, MD  EPINEPHrine (EPIPEN 2-PAK) 0.3 mg/0.3 mL IJ SOAJ injection Inject 0.3 mLs (0.3 mg total) into the muscle once. 03/13/14   Purvis SheffieldForrest Harrison, MD  Exenatide ER 2 MG PEN Inject 2 mg  into the skin once a week. 07/09/14   Kermit Baloavid S Tysinger, PA-C  Fluticasone-Salmeterol (ADVAIR DISKUS) 250-50 MCG/DOSE AEPB Inhale 1 puff into the lungs 2 (two) times daily. 06/23/14   Kermit Baloavid S Tysinger, PA-C  furosemide (LASIX) 20 MG tablet take 1 tablet by mouth once daily 08/06/14   Kermit Baloavid S Tysinger, PA-C  Insulin Glargine (LANTUS SOLOSTAR) 100 UNIT/ML Solostar Pen Inject 20 Units into the skin daily at 10 pm. 07/09/14   Kermit Baloavid S Tysinger, PA-C  olmesartan (BENICAR) 40 MG tablet Take 1 tablet (40 mg total) by mouth daily. 06/03/14   Kermit Baloavid S Tysinger, PA-C  paliperidone (INVEGA) 6 MG 24 hr tablet Take 6 mg by mouth at bedtime.    Historical Provider, MD  pantoprazole (PROTONIX) 40 MG tablet Take 1 tablet (40 mg total) by mouth 2 (two) times daily. 03/08/14   Alvira MondayErin Schlossman, MD  rosuvastatin (CRESTOR) 10 MG tablet Take 1 tablet (10 mg total) by mouth daily. 07/09/14   Kermit Baloavid S Tysinger, PA-C  sertraline (ZOLOFT) 100 MG tablet Take 100 mg by mouth daily.    Historical Provider, MD  sitaGLIPtin-metformin (JANUMET) 50-1000 MG per tablet Take 1 tablet by mouth 2 (two) times daily with a meal. 07/09/14   Kermit Baloavid S Tysinger, PA-C  sucralfate (CARAFATE) 1 G tablet Take 1 g by mouth 4 (four) times daily -  with meals and at bedtime.    Historical Provider, MD  traZODone (DESYREL) 100 MG tablet Take 100 mg by mouth at bedtime.    Historical Provider, MD   BP 146/77 mmHg  Pulse 101  Temp(Src) 98.5 F (36.9 C) (Oral)  Resp 18  Ht 5\' 4"  (1.626 m)  Wt 217 lb (98.431 kg)  BMI 37.23 kg/m2  SpO2 98% Physical Exam  Constitutional: She appears well-developed and well-nourished. No distress.  African American female, sitting upright, non toxic.  HENT:  Head: Atraumatic.  Eyes: Conjunctivae are normal.  Neck: Neck supple.  Cardiovascular: Normal rate and regular rhythm.   Pulmonary/Chest: Effort normal and breath sounds normal. No respiratory distress. She has no wheezes.  Abdominal: She exhibits distension (Abdomen is  mildly distended). There is tenderness (mild tenderness to upper abdomen but no significant discomfort in lower abdomen on palpation).  Genitourinary:  Chaperone present on exam. Normal rectal tone, no obvious external hemorrhoid, rectal vault is empty. Trace of blood noted on gloved finger. No obvious mass or anal fissure noted.  Neurological: She is alert.  Skin: No rash noted.  Psychiatric: She has a normal mood and affect.  Nursing note and vitals reviewed.   ED Course  Procedures (including critical care time)  12:17 AM Patient here with constipation for close to a month. She does have a mildly distended abdomen but no obvious stools in the rectal vault. No nausea or vomiting. She is afebrile with stable normal vital signs. She does have prior abdominal  surgery, therefore will obtain CT scan for further evaluation and to r/o SBO. Patient is however allergic to IV contrast, we'll obtain a noncontrast CT scan. Patient may need milk and molasses enema.    12:45 AM Fecal occult blood is positive, but likely due to recent self manual disimpaction causing bleeding.  Normal lactic acid, doubt ischemic bowel disease.  UA neg for infection.  Mild leukocytosis with WBC 11.5, no left shift.  Mild hyperglycemia CBG 223, normal anion gap.  Normal trop, EKG is of poor quality but no acute ischemic changes.  CT abd/pelvis without any acute finding.    Recommend using Miralax, along with fleet enema as needed.  Pt will f/u with PCP and with Dr. Bosie Clos for further care.  Return precaution discussed.  Labs Review Labs Reviewed  CBC WITH DIFFERENTIAL/PLATELET - Abnormal; Notable for the following:    WBC 11.5 (*)    Hemoglobin 11.5 (*)    HCT 34.8 (*)    All other components within normal limits  COMPREHENSIVE METABOLIC PANEL - Abnormal; Notable for the following:    Sodium 134 (*)    Glucose, Bld 223 (*)    Calcium 8.6 (*)    Albumin 3.1 (*)    Total Bilirubin 0.2 (*)    All other components  within normal limits  URINALYSIS, ROUTINE W REFLEX MICROSCOPIC (NOT AT [facility]) - Abnormal; Notable for the following:    APPearance CLOUDY (*)    All other components within normal limits  POC OCCULT BLOOD, ED - Abnormal; Notable for the following:    Fecal Occult Bld POSITIVE (*)    All other components within normal limits  LIPASE, BLOOD  TROPONIN I  I-STAT CG4 LACTIC ACID, ED    Imaging Review Ct Abdomen Pelvis Wo Contrast  08/31/2014   CLINICAL DATA:  Acute onset of mid abdominal pain. 2 weeks of constipation. Rectal bleeding. Initial encounter.  EXAM: CT ABDOMEN AND PELVIS WITHOUT CONTRAST  TECHNIQUE: Multidetector CT imaging of the abdomen and pelvis was performed following the standard protocol without IV contrast.  COMPARISON:  CT of the abdomen and pelvis from 04/16/2014  FINDINGS: The visualized portions of the lungs are clear.  The liver and spleen are unremarkable in appearance. The patient is status post cholecystectomy, with clips noted at the gallbladder fossa. The pancreas and adrenal glands are unremarkable.  An apparent 1.3 cm cyst is noted at the posterior aspect of the left kidney, with associated mild scarring. There is no evidence of hydronephrosis. No renal or ureteral stones are seen. No perinephric stranding is appreciated.  No free fluid is identified. The small bowel is unremarkable in appearance. The stomach is within normal limits. No acute vascular abnormalities are seen. Minimal calcification is noted along the abdominal aorta.  The appendix is normal in caliber and contains trace air, without evidence of appendicitis. The colon is unremarkable in appearance.  The bladder is decompressed and not well assessed. The patient is status post hysterectomy. The ovaries are grossly symmetric. No suspicious adnexal masses are seen. No inguinal lymphadenopathy is seen.  No acute osseous abnormalities are identified. Postoperative change is noted along the proximal left femur.   IMPRESSION: 1. No acute abnormality seen within the abdomen or pelvis. No CT evidence for constipation. Small to moderate amount of stool noted in the colon. 2. Small left renal cyst, with mild associated scarring.   Electronically Signed   By: Roanna Raider M.D.   On: 08/31/2014 00:39  EKG Interpretation None      Date: 08/31/2014  Rate: 95  Rhythm: normal sinus rhythm  QRS Axis: normal  Intervals: normal  ST/T Wave abnormalities: nonspecific ST changes  Conduction Disutrbances: none  Narrative Interpretation:   Old EKG Reviewed: No significant changes noted     MDM   Final diagnoses:  Slow transit constipation    BP 125/75 mmHg  Pulse 91  Temp(Src) 98.5 F (36.9 C) (Oral)  Resp 22  Ht  (1.626 m)  Wt 217 lb (98.431 kg)  BMI 37.23 kg/m2  SpO2 100%  I have reviewed nursing notes and vital signs. I personally viewed the imaging tests through PACS system and agrees with radiologist's intepretation I reviewed available ER/hospitalization records through the EMR     Fayrene Helper, PA-C 08/31/14 0047  Purvis Sheffield, MD 08/31/14 1714

## 2014-08-30 NOTE — ED Notes (Signed)
The pt has not had a bm for one week.  She was attempting to digitally remove the stool and she started bleeding from the rectum and it was not stopping.  When she wipes she sees blood

## 2014-08-30 NOTE — ED Notes (Signed)
She saw drharwani today for these symptoms.

## 2014-08-31 DIAGNOSIS — K5901 Slow transit constipation: Secondary | ICD-10-CM | POA: Diagnosis not present

## 2014-08-31 LAB — I-STAT CG4 LACTIC ACID, ED: Lactic Acid, Venous: 0.91 mmol/L (ref 0.5–2.0)

## 2014-08-31 MED ORDER — POLYETHYLENE GLYCOL 3350 17 G PO PACK: 17.0000 g | PACK | Freq: Every day | ORAL | Status: DC

## 2014-08-31 NOTE — Discharge Instructions (Signed)
Please take Miralax daily to help increase fiber intake and help move your bowel.  Use Fleet enema and ducolax that your doctor has previously prescribed.   Constipation Constipation is when a person has fewer than three bowel movements a week, has difficulty having a bowel movement, or has stools that are dry, hard, or larger than normal. As people grow older, constipation is more common. If you try to fix constipation with medicines that make you have a bowel movement (laxatives), the problem may get worse. Long-term laxative use may cause the muscles of the colon to become weak. A low-fiber diet, not taking in enough fluids, and taking certain medicines may make constipation worse.  CAUSES   Certain medicines, such as antidepressants, pain medicine, iron supplements, antacids, and water pills.   Certain diseases, such as diabetes, irritable bowel syndrome (IBS), thyroid disease, or depression.   Not drinking enough water.   Not eating enough fiber-rich foods.   Stress or travel.   Lack of physical activity or exercise.   Ignoring the urge to have a bowel movement.   Using laxatives too much.  SIGNS AND SYMPTOMS   Having fewer than three bowel movements a week.   Straining to have a bowel movement.   Having stools that are hard, dry, or larger than normal.   Feeling full or bloated.   Pain in the lower abdomen.   Not feeling relief after having a bowel movement.  DIAGNOSIS  Your health care provider will take a medical history and perform a physical exam. Further testing may be done for severe constipation. Some tests may include:  A barium enema X-ray to examine your rectum, colon, and, sometimes, your small intestine.   A sigmoidoscopy to examine your lower colon.   A colonoscopy to examine your entire colon. TREATMENT  Treatment will depend on the severity of your constipation and what is causing it. Some dietary treatments include drinking more fluids  and eating more fiber-rich foods. Lifestyle treatments may include regular exercise. If these diet and lifestyle recommendations do not help, your health care provider may recommend taking over-the-counter laxative medicines to help you have bowel movements. Prescription medicines may be prescribed if over-the-counter medicines do not work.  HOME CARE INSTRUCTIONS   Eat foods that have a lot of fiber, such as fruits, vegetables, whole grains, and beans.  Limit foods high in fat and processed sugars, such as french fries, hamburgers, cookies, candies, and soda.   A fiber supplement may be added to your diet if you cannot get enough fiber from foods.   Drink enough fluids to keep your urine clear or pale yellow.   Exercise regularly or as directed by your health care provider.   Go to the restroom when you have the urge to go. Do not hold it.   Only take over-the-counter or prescription medicines as directed by your health care provider. Do not take other medicines for constipation without talking to your health care provider first.  SEEK IMMEDIATE MEDICAL CARE IF:   You have bright red blood in your stool.   Your constipation lasts for more than 4 days or gets worse.   You have abdominal or rectal pain.   You have thin, pencil-like stools.   You have unexplained weight loss. MAKE SURE YOU:   Understand these instructions.  Will watch your condition.  Will get help right away if you are not doing well or get worse. Document Released: 11/13/2003 Document Revised: 02/19/2013 Document Reviewed: 11/26/2012  ExitCare® Patient Information ©2015 [employer]. This information is not intended to replace advice given to you by your health care provider. Make sure you discuss any questions you have with your health care provider. ° °

## 2014-09-03 ENCOUNTER — Encounter (HOSPITAL_COMMUNITY): Payer: Medicare Other

## 2014-09-03 ENCOUNTER — Ambulatory Visit (HOSPITAL_COMMUNITY)
Admission: RE | Admit: 2014-09-03 | Discharge: 2014-09-03 | Disposition: A | Discharge status: Home / Self Care | Payer: Medicare Other | Source: Ambulatory Visit | Attending: Cardiology | Admitting: Cardiology

## 2014-09-03 ENCOUNTER — Encounter (HOSPITAL_COMMUNITY): Admission: RE | Admit: 2014-09-03 | Payer: Medicare Other | Source: Ambulatory Visit

## 2014-09-03 DIAGNOSIS — R079 Chest pain, unspecified: Secondary | ICD-10-CM | POA: Diagnosis not present

## 2014-09-03 DIAGNOSIS — Z79899 Other long term (current) drug therapy: Secondary | ICD-10-CM | POA: Diagnosis not present

## 2014-09-03 DIAGNOSIS — I2510 Atherosclerotic heart disease of native coronary artery without angina pectoris: Secondary | ICD-10-CM | POA: Diagnosis not present

## 2014-09-03 DIAGNOSIS — I10 Essential (primary) hypertension: Secondary | ICD-10-CM | POA: Diagnosis not present

## 2014-09-03 DIAGNOSIS — E119 Type 2 diabetes mellitus without complications: Secondary | ICD-10-CM | POA: Diagnosis not present

## 2014-09-03 LAB — HEPATIC FUNCTION PANEL
ALBUMIN: 3.1 g/dL — AB (ref 3.5–5.0)
ALK PHOS: 88 U/L (ref 38–126)
ALT: 14 U/L (ref 14–54)
AST: 14 U/L — AB (ref 15–41)
BILIRUBIN INDIRECT: 0.4 mg/dL (ref 0.3–0.9)
Bilirubin, Direct: 0.1 mg/dL (ref 0.1–0.5)
TOTAL PROTEIN: 6.8 g/dL (ref 6.5–8.1)
Total Bilirubin: 0.5 mg/dL (ref 0.3–1.2)

## 2014-09-03 LAB — BASIC METABOLIC PANEL
Anion gap: 9 (ref 5–15)
BUN: 8 mg/dL (ref 6–20)
CO2: 25 mmol/L (ref 22–32)
Calcium: 8.6 mg/dL — ABNORMAL LOW (ref 8.9–10.3)
Chloride: 101 mmol/L (ref 101–111)
Creatinine, Ser: 1.04 mg/dL — ABNORMAL HIGH (ref 0.44–1.00)
GFR calc Af Amer: >60 mL/min (ref >60)
GFR calc non Af Amer: >60 mL/min (ref >60)
GLUCOSE: 207 mg/dL — AB (ref 65–99)
Potassium: 4.1 mmol/L (ref 3.5–5.1)
SODIUM: 135 mmol/L (ref 135–145)

## 2014-09-03 LAB — LIPID PANEL
CHOLESTEROL: 152 mg/dL (ref 0–200)
HDL: 47 mg/dL (ref >40)
LDL Cholesterol: 90 mg/dL (ref 0–99)
Total CHOL/HDL Ratio: 3.2 RATIO
Triglycerides: 76 mg/dL (ref <150)
VLDL: 15 mg/dL (ref 0–40)

## 2014-09-03 LAB — TSH: TSH: 1.853 u[IU]/mL (ref 0.350–4.500)

## 2014-09-03 MED ORDER — TECHNETIUM TC 99M SESTAMIBI GENERIC - CARDIOLITE
10.0000 | Freq: Once | INTRAVENOUS | Status: AC | PRN
  Administered 2014-09-03: 10 via INTRAVENOUS

## 2014-09-03 MED ORDER — ACETAMINOPHEN 325 MG PO TABS
ORAL_TABLET | ORAL | Status: AC
  Filled 2014-09-03: qty 2

## 2014-09-03 MED ORDER — REGADENOSON 0.4 MG/5ML IV SOLN
0.4000 mg | Freq: Once | INTRAVENOUS | Status: AC
  Administered 2014-09-03: 0.4 mg via INTRAVENOUS

## 2014-09-03 MED ORDER — ACETAMINOPHEN 325 MG PO TABS
650.0000 mg | ORAL_TABLET | Freq: Once | ORAL | Status: AC
  Administered 2014-09-03: 650 mg via ORAL

## 2014-09-03 MED ORDER — REGADENOSON 0.4 MG/5ML IV SOLN
INTRAVENOUS | Status: AC
  Filled 2014-09-03: qty 5

## 2014-09-03 MED ORDER — TECHNETIUM TC 99M SESTAMIBI GENERIC - CARDIOLITE
30.0000 | Freq: Once | INTRAVENOUS | Status: AC | PRN
  Administered 2014-09-03: 30 via INTRAVENOUS

## 2014-09-04 LAB — MICROALBUMIN, URINE: Microalb, Ur: 4.8 ug/mL — ABNORMAL HIGH

## 2014-09-04 LAB — HEMOGLOBIN A1C
HEMOGLOBIN A1C: 8.4 % — AB (ref 4.8–5.6)
Mean Plasma Glucose: 194 mg/dL

## 2014-09-11 ENCOUNTER — Other Ambulatory Visit: Payer: Self-pay | Admitting: Family Medicine

## 2014-09-11 ENCOUNTER — Telehealth: Payer: Self-pay | Admitting: Medical

## 2014-09-11 MED ORDER — PANTOPRAZOLE SODIUM 40 MG PO TBEC: 40.0000 mg | DELAYED_RELEASE_TABLET | Freq: Two times a day (BID) | ORAL | Status: DC

## 2014-09-11 NOTE — Telephone Encounter (Signed)
Rx refill was sent to the pharmacy

## 2014-09-11 NOTE — Telephone Encounter (Signed)
[facility] 40[facility] [address]

## 2014-10-05 ENCOUNTER — Other Ambulatory Visit: Payer: Self-pay | Admitting: Medical

## 2014-10-22 ENCOUNTER — Other Ambulatory Visit: Payer: Self-pay | Admitting: Medical

## 2014-10-24 ENCOUNTER — Other Ambulatory Visit: Payer: Self-pay | Admitting: Obstetrics

## 2014-10-24 DIAGNOSIS — N63 Unspecified lump in unspecified breast: Secondary | ICD-10-CM

## 2014-10-29 ENCOUNTER — Other Ambulatory Visit: Payer: Medicare Other

## 2014-10-31 ENCOUNTER — Encounter: Payer: Self-pay | Admitting: Medical

## 2014-10-31 ENCOUNTER — Telehealth: Payer: Self-pay | Admitting: Medical

## 2014-10-31 ENCOUNTER — Ambulatory Visit
Admission: RE | Admit: 2014-10-31 | Discharge: 2014-10-31 | Disposition: A | Discharge status: Home / Self Care | Payer: Medicare Other | Source: Ambulatory Visit | Attending: Obstetrics | Admitting: Obstetrics

## 2014-10-31 ENCOUNTER — Ambulatory Visit (INDEPENDENT_AMBULATORY_CARE_PROVIDER_SITE_OTHER): Payer: Medicare Other | Admitting: Medical

## 2014-10-31 VITALS — BP 142/90 | HR 80 | Temp 98.0°F | Resp 20 | Wt 220.2 lb

## 2014-10-31 DIAGNOSIS — IMO0002 Reserved for concepts with insufficient information to code with codable children: Secondary | ICD-10-CM

## 2014-10-31 DIAGNOSIS — F99 Mental disorder, not otherwise specified: Secondary | ICD-10-CM

## 2014-10-31 DIAGNOSIS — N63 Unspecified lump in unspecified breast: Secondary | ICD-10-CM

## 2014-10-31 DIAGNOSIS — I10 Essential (primary) hypertension: Secondary | ICD-10-CM

## 2014-10-31 DIAGNOSIS — R4689 Other symptoms and signs involving appearance and behavior: Secondary | ICD-10-CM

## 2014-10-31 DIAGNOSIS — E1165 Type 2 diabetes mellitus with hyperglycemia: Secondary | ICD-10-CM

## 2014-10-31 DIAGNOSIS — F209 Schizophrenia, unspecified: Secondary | ICD-10-CM

## 2014-10-31 DIAGNOSIS — F919 Conduct disorder, unspecified: Secondary | ICD-10-CM

## 2014-10-31 DIAGNOSIS — Z7409 Other reduced mobility: Secondary | ICD-10-CM

## 2014-10-31 DIAGNOSIS — R4189 Other symptoms and signs involving cognitive functions and awareness: Secondary | ICD-10-CM

## 2014-10-31 NOTE — Telephone Encounter (Signed)
Called [facility], spoke with Nena Polio [phone] for referral for evaluation and assistance with medications.  I also called [facility] as a courtesy call stating that we have contacted [facility] to get assistance for patient.
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
## 2014-11-01 ENCOUNTER — Other Ambulatory Visit: Payer: Self-pay | Admitting: Medical

## 2014-11-04 ENCOUNTER — Encounter: Payer: Self-pay | Admitting: Medical

## 2014-11-04 ENCOUNTER — Other Ambulatory Visit: Payer: Self-pay | Admitting: Medical

## 2014-11-04 MED ORDER — OLMESARTAN MEDOXOMIL 40 MG PO TABS: 40.0000 mg | ORAL_TABLET | Freq: Every day | ORAL | Status: DC

## 2014-11-04 MED ORDER — ATENOLOL 50 MG PO TABS: 50.0000 mg | ORAL_TABLET | Freq: Two times a day (BID) | ORAL | Status: DC

## 2014-11-04 MED ORDER — EXENATIDE ER 2 MG ~~LOC~~ PEN: 2.0000 mg | PEN_INJECTOR | SUBCUTANEOUS | Status: DC

## 2014-11-04 MED ORDER — FUROSEMIDE 20 MG PO TABS: 20.0000 mg | ORAL_TABLET | Freq: Every day | ORAL | Status: DC

## 2014-11-04 MED ORDER — FLUTICASONE-SALMETEROL 250-50 MCG/DOSE IN AEPB: 1.0000 | INHALATION_SPRAY | Freq: Two times a day (BID) | RESPIRATORY_TRACT | Status: DC

## 2014-11-04 MED ORDER — SITAGLIPTIN PHOS-METFORMIN HCL 50-1000 MG PO TABS: 1.0000 | ORAL_TABLET | Freq: Two times a day (BID) | ORAL | Status: DC

## 2014-11-04 MED ORDER — ALBUTEROL SULFATE HFA 108 (90 BASE) MCG/ACT IN AERS: 2.0000 | INHALATION_SPRAY | Freq: Four times a day (QID) | RESPIRATORY_TRACT | Status: DC | PRN

## 2014-11-04 MED ORDER — INSULIN GLARGINE 100 UNIT/ML SOLOSTAR PEN: 20.0000 [IU] | PEN_INJECTOR | Freq: Every day | SUBCUTANEOUS | Status: DC

## 2014-11-04 MED ORDER — AMLODIPINE BESYLATE 10 MG PO TABS: 10.0000 mg | ORAL_TABLET | Freq: Every day | ORAL | Status: DC

## 2014-11-04 MED ORDER — ROSUVASTATIN CALCIUM 10 MG PO TABS: 10.0000 mg | ORAL_TABLET | Freq: Every day | ORAL | Status: DC

## 2014-11-11 ENCOUNTER — Telehealth: Payer: Self-pay | Admitting: Medical

## 2014-11-11 DIAGNOSIS — F250 Schizoaffective disorder, bipolar type: Secondary | ICD-10-CM | POA: Diagnosis not present

## 2014-11-11 NOTE — Telephone Encounter (Signed)
She needs to let [facility] prescribe what they feel is best for her mental health conditions.   Any medication has side effects including aspirin and OTC things.   I know this is a common medication for her condition, and I would go with their recommendation.     Also what is the status of the home health eval referral?  I believe [facility] case management was going to do the home health eval.  We made the referral after last visit.  Check with [facility]

## 2014-11-11 NOTE — Telephone Encounter (Signed)
PT CALLED and was seen at [facility] said they pres her ziprasidone and she read the side effects and was concerned about them, said she has a lot of health problems and that they med is bad for her, wanted to know if you would prescibe her something else in the place of the ziprasidone, pt can be reached at [phone]

## 2014-11-12 ENCOUNTER — Other Ambulatory Visit: Payer: Self-pay

## 2014-11-12 DIAGNOSIS — IMO0002 Reserved for concepts with insufficient information to code with codable children: Secondary | ICD-10-CM

## 2014-11-12 DIAGNOSIS — E1165 Type 2 diabetes mellitus with hyperglycemia: Secondary | ICD-10-CM

## 2014-11-12 NOTE — Telephone Encounter (Signed)
Left message for pt to call back  °

## 2014-11-12 NOTE — Patient Outreach (Signed)
[facility]) Care Management  11/12/2014  Cheyenne Alvarez 1965-11-11 [id number]   Referral from MD office, assigned Donato Schultz, RN to outreach.  Thanks, Corrie Mckusick. Sharlee Blew [facility] Care Management [facility] CM Assistant Phone: [phone] Fax: [phone]
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
## 2014-11-12 NOTE — Patient Outreach (Addendum)
[facility]) Care Management  11/12/2014  Cheyenne Alvarez 01-11-1966 [id number]   Request from Mervyn Gay, RN to assign Community RN, Pharmacy and SW, assigned Cheyenne Durie, RN, Cheyenne Alvarez, PharmD and Cheyenne Alvarez, Cheyenne Alvarez.  Thanks, Cheyenne Alvarez. Sharlee Blew [facility] Care Management [facility] CM Assistant Phone: [phone] Fax: [phone]

## 2014-11-12 NOTE — Telephone Encounter (Signed)
Called [facility] left message on Cheyenne Alvarez machine this is who [name] spoke with about the referral I left a message to see if they taken care of the referral yet

## 2014-11-12 NOTE — Telephone Encounter (Signed)
Pt informed word for word and verbalized understanding 

## 2014-11-13 ENCOUNTER — Other Ambulatory Visit: Payer: Self-pay

## 2014-11-13 ENCOUNTER — Other Ambulatory Visit: Payer: Self-pay | Admitting: Licensed Clinical Social Worker

## 2014-11-13 NOTE — Patient Outreach (Signed)
[facility]) Care Management  11/13/2014  Cheyenne Alvarez 1965-12-04 [id number]   Assessment- CSW received referral to assist patient with SW needs and attempted initial outreach by calling home phone number on 11/13/14. Patient answered and provided HIPPA verifications. CSW introduced self, reason for call and of [facility] Care Management Services. CSW had a difficult time understanding patient. Patient reported that she needed assistance at home with taking care of self. Patient reported that she no longer has a home aid and is interested in gaining one back. CSW questioned why the discontinue of services and patient explained that her daughter Morrie Sheldon was staying with her and patient was deemed fit to appropriately take care of self with the assistance of the daughter. Patient reported that daughter is no longer living with her and they do not have any contact. Patient was unsure if [place] [facility] (home care agency) or [employer] (pcs) declined her the home aid but she reported that "they did an assessment." Patient shared that she wants to gain a home aid again and wishes it to be back with [facility] care even though expressing some dissatisfaction with home aid. Patient also made it a point to state "I do not want anyone to take care of me all the time" and "I do not want to go to a nursing home."  Patient reported issues with medications. Patient stated that she no longer had issues with insulin management but then retracted that statement during a later point in the conversation. Patient reported "racing thoughts and anxiety." She denied any homicidal or suicidal ideations. Patient reported that she goes to [facility] and that recently she did not wish to take a new medication ziprasidone prescribed there because the side effects made her nervous. Patient reported that she asked for her PCP to prescribe an alternative prescription but then declined because it would cause weight gain.  Patient reported that "I trust my doctor, the side effects just scare me." Patient reported she will be starting new medication in the "next few days."   Patient reported that she needed to get off the phone. CSW informed patient that she would be in touch to complete further assessment and patient was agreeable.  CSW contacted Telephonic RN Crystal Hutchinson to gather further information on patient in regards to [facility].   Plan-CSW will remain available to patient. CSW will await RNCM to complete assessment in the residence in order to gain further knowledge on how much care is needed in order to make next step in [facility] referral.   .Dickie La, BSW, MSW, LCSW [facility] [email or web address] Phone: [phone] Fax: [phone]

## 2014-11-13 NOTE — Patient Outreach (Signed)
[facility]) Care Management  11/13/2014  Claretha Townshend Jun 30, 1965 [id number]   Interdisciplinary Consult Call   Inbound call from [facility] LCSW, Jamesetta Orleans.   Screening Assessment discussed to add more clarity to reason for referral.  RN CM advised that [facility] Community RN CM will be able to add additional clinical updates following community assessment which will help to clarify level of care needs in the home and possible need for PCS application.   SW will follow-up with [facility] RN CM post initial assessment to determine further needs.   Donato Schultz, RN, BSN, [facility] CCM  [facility] [employer] Management Coordinator [phone] Direct [phone] Cell [phone] Office [phone] Fax

## 2014-11-14 ENCOUNTER — Other Ambulatory Visit: Payer: Self-pay | Admitting: *Deleted

## 2014-11-14 NOTE — Patient Outreach (Signed)
Referral received from telephonic nurse, C. Hutchinson, for community assessment.  Per chart, member has history of diabetes, hypertension, anemia, and bipolar.  Call placed to member to introduce self and schedule initial home visit.  No answer, HIPPA compliant voice message left.  Will make second attempt to contact member next week.  Kemper Durie, BSN, [facility] Care Management  [facility] Care Manager [phone]

## 2014-11-17 ENCOUNTER — Telehealth: Payer: Self-pay | Admitting: Medical

## 2014-11-17 ENCOUNTER — Other Ambulatory Visit: Payer: Self-pay | Admitting: *Deleted

## 2014-11-17 NOTE — Telephone Encounter (Signed)
Called [facility] left a message for  Cheyenne Alvarez [phone] about the status of referral for evaluation and assistance with medications, and about the needs for home eval to assess the home environment and her ability to take care of her self. Needs nurse to help teach her how to use her medication properly, need to assess her ability to take care of her ADLs.  I have also faxed in the REFERRAL form again to [facility] for this as well @ [phone]

## 2014-11-17 NOTE — Telephone Encounter (Signed)
Please check on this as I ordered/requested a home health site visit for home evaluation given her psychiatric and health issues.   This shouldn't have anything to do with [facility] for the initial home eval.   Is Home Health or [facility] going out to do site visit?

## 2014-11-17 NOTE — Telephone Encounter (Signed)
Pt called and is going to have some papers sent over from [facility]  to see about getting homecare for the pt, they need approval before someone will come over, i will put in folder to send back when we get them,

## 2014-11-17 NOTE — Patient Outreach (Signed)
Call received back from member (message left last week).  This care manager introduced self and the purpose of the call.  Member agrees to have initial home visit next week.  She speaks on the need of personal care assistance, made aware that the social worker, B. Alona Bene is actively working on that concern.  She agrees to have the Child psychotherapist accompany this care manager to the home visit if possible.    This care manager will notify [name] regarding the initial home visit.  Member encouraged to call this care manager if she has any concerns prior to next week's visit.  Kemper Durie, BSN, [facility] Care Management  [facility] Care Manager [phone]

## 2014-11-21 ENCOUNTER — Other Ambulatory Visit: Payer: Medicare Other | Admitting: Pharmacist

## 2014-11-21 ENCOUNTER — Emergency Department (HOSPITAL_COMMUNITY)
Admission: EM | Admit: 2014-11-21 | Discharge: 2014-11-22 | Disposition: A | Discharge status: Home / Self Care | Payer: Medicare Other | Attending: Emergency Medicine | Admitting: Emergency Medicine

## 2014-11-21 ENCOUNTER — Encounter (HOSPITAL_COMMUNITY): Payer: Self-pay

## 2014-11-21 DIAGNOSIS — Z794 Long term (current) use of insulin: Secondary | ICD-10-CM | POA: Insufficient documentation

## 2014-11-21 DIAGNOSIS — F250 Schizoaffective disorder, bipolar type: Secondary | ICD-10-CM | POA: Diagnosis not present

## 2014-11-21 DIAGNOSIS — F4310 Post-traumatic stress disorder, unspecified: Secondary | ICD-10-CM | POA: Diagnosis not present

## 2014-11-21 DIAGNOSIS — Z862 Personal history of diseases of the blood and blood-forming organs and certain disorders involving the immune mechanism: Secondary | ICD-10-CM | POA: Insufficient documentation

## 2014-11-21 DIAGNOSIS — Z8701 Personal history of pneumonia (recurrent): Secondary | ICD-10-CM | POA: Diagnosis not present

## 2014-11-21 DIAGNOSIS — I2510 Atherosclerotic heart disease of native coronary artery without angina pectoris: Secondary | ICD-10-CM | POA: Insufficient documentation

## 2014-11-21 DIAGNOSIS — E119 Type 2 diabetes mellitus without complications: Secondary | ICD-10-CM | POA: Insufficient documentation

## 2014-11-21 DIAGNOSIS — K2970 Gastritis, unspecified, without bleeding: Secondary | ICD-10-CM | POA: Insufficient documentation

## 2014-11-21 DIAGNOSIS — J449 Chronic obstructive pulmonary disease, unspecified: Secondary | ICD-10-CM | POA: Insufficient documentation

## 2014-11-21 DIAGNOSIS — Z872 Personal history of diseases of the skin and subcutaneous tissue: Secondary | ICD-10-CM | POA: Insufficient documentation

## 2014-11-21 DIAGNOSIS — G43909 Migraine, unspecified, not intractable, without status migrainosus: Secondary | ICD-10-CM | POA: Diagnosis not present

## 2014-11-21 DIAGNOSIS — Z79899 Other long term (current) drug therapy: Secondary | ICD-10-CM | POA: Diagnosis not present

## 2014-11-21 DIAGNOSIS — Z046 Encounter for general psychiatric examination, requested by authority: Secondary | ICD-10-CM | POA: Diagnosis present

## 2014-11-21 DIAGNOSIS — F1310 Sedative, hypnotic or anxiolytic abuse, uncomplicated: Secondary | ICD-10-CM | POA: Diagnosis not present

## 2014-11-21 DIAGNOSIS — I10 Essential (primary) hypertension: Secondary | ICD-10-CM | POA: Insufficient documentation

## 2014-11-21 DIAGNOSIS — E669 Obesity, unspecified: Secondary | ICD-10-CM | POA: Insufficient documentation

## 2014-11-21 DIAGNOSIS — Z7951 Long term (current) use of inhaled steroids: Secondary | ICD-10-CM | POA: Insufficient documentation

## 2014-11-21 DIAGNOSIS — F319 Bipolar disorder, unspecified: Secondary | ICD-10-CM | POA: Diagnosis present

## 2014-11-21 DIAGNOSIS — F419 Anxiety disorder, unspecified: Secondary | ICD-10-CM | POA: Diagnosis not present

## 2014-11-21 DIAGNOSIS — R45851 Suicidal ideations: Secondary | ICD-10-CM | POA: Diagnosis not present

## 2014-11-21 DIAGNOSIS — Z720 Tobacco use: Secondary | ICD-10-CM | POA: Insufficient documentation

## 2014-11-21 DIAGNOSIS — K219 Gastro-esophageal reflux disease without esophagitis: Secondary | ICD-10-CM | POA: Insufficient documentation

## 2014-11-21 DIAGNOSIS — F259 Schizoaffective disorder, unspecified: Secondary | ICD-10-CM

## 2014-11-21 LAB — RAPID URINE DRUG SCREEN, HOSP PERFORMED
AMPHETAMINES: NOT DETECTED
BENZODIAZEPINES: POSITIVE — AB
Barbiturates: NOT DETECTED
COCAINE: NOT DETECTED
Opiates: NOT DETECTED
Tetrahydrocannabinol: NOT DETECTED

## 2014-11-21 LAB — CBG MONITORING, ED
GLUCOSE-CAPILLARY: 121 mg/dL — AB (ref 65–99)
Glucose-Capillary: 121 mg/dL — ABNORMAL HIGH (ref 65–99)
Glucose-Capillary: 118 mg/dL — ABNORMAL HIGH (ref 65–99)
Glucose-Capillary: 117 mg/dL — ABNORMAL HIGH (ref 65–99)

## 2014-11-21 LAB — COMPREHENSIVE METABOLIC PANEL
ALBUMIN: 4.1 g/dL (ref 3.5–5.0)
ALT: 14 U/L (ref 14–54)
ANION GAP: 9 (ref 5–15)
AST: 19 U/L (ref 15–41)
Alkaline Phosphatase: 88 U/L (ref 38–126)
BUN: 11 mg/dL (ref 6–20)
CO2: 25 mmol/L (ref 22–32)
Calcium: 8.8 mg/dL — ABNORMAL LOW (ref 8.9–10.3)
Chloride: 102 mmol/L (ref 101–111)
Creatinine, Ser: 1.03 mg/dL — ABNORMAL HIGH (ref 0.44–1.00)
Glucose, Bld: 123 mg/dL — ABNORMAL HIGH (ref 65–99)
POTASSIUM: 3.4 mmol/L — AB (ref 3.5–5.1)
Sodium: 136 mmol/L (ref 135–145)
TOTAL PROTEIN: 8.3 g/dL — AB (ref 6.5–8.1)

## 2014-11-21 LAB — CBC
HEMATOCRIT: 36.2 % (ref 36.0–46.0)
HEMOGLOBIN: 12 g/dL (ref 12.0–15.0)
MCH: 27 pg (ref 26.0–34.0)
MCHC: 33.1 g/dL (ref 30.0–36.0)
MCV: 81.3 fL (ref 78.0–100.0)
Platelets: 444 10*3/uL — ABNORMAL HIGH (ref 150–400)
RBC: 4.45 MIL/uL (ref 3.87–5.11)
RDW: 15.5 % (ref 11.5–15.5)
WBC: 13.1 10*3/uL — AB (ref 4.0–10.5)

## 2014-11-21 LAB — ETHANOL

## 2014-11-21 MED ORDER — PALIPERIDONE ER 6 MG PO TB24
6.0000 mg | ORAL_TABLET | Freq: Every day | ORAL | Status: DC
  Administered 2014-11-21: 6 mg via ORAL
  Filled 2014-11-21 (×2): qty 1

## 2014-11-21 MED ORDER — LINAGLIPTIN 5 MG PO TABS
5.0000 mg | ORAL_TABLET | Freq: Every day | ORAL | Status: DC
  Administered 2014-11-21 – 2014-11-22 (×2): 5 mg via ORAL
  Filled 2014-11-21 (×3): qty 1

## 2014-11-21 MED ORDER — FUROSEMIDE 20 MG PO TABS
20.0000 mg | ORAL_TABLET | Freq: Every day | ORAL | Status: DC
  Administered 2014-11-21 – 2014-11-22 (×2): 20 mg via ORAL
  Filled 2014-11-21 (×2): qty 1

## 2014-11-21 MED ORDER — MOMETASONE FURO-FORMOTEROL FUM 100-5 MCG/ACT IN AERO
2.0000 | INHALATION_SPRAY | Freq: Two times a day (BID) | RESPIRATORY_TRACT | Status: DC
  Administered 2014-11-21 – 2014-11-22 (×3): 2 via RESPIRATORY_TRACT
  Filled 2014-11-21: qty 8.8

## 2014-11-21 MED ORDER — INSULIN GLARGINE 100 UNIT/ML ~~LOC~~ SOLN
20.0000 [IU] | Freq: Every day | SUBCUTANEOUS | Status: DC
  Filled 2014-11-21: qty 0.2

## 2014-11-21 MED ORDER — IRBESARTAN 300 MG PO TABS
300.0000 mg | ORAL_TABLET | Freq: Every day | ORAL | Status: DC
  Administered 2014-11-21 – 2014-11-22 (×2): 300 mg via ORAL
  Filled 2014-11-21 (×2): qty 1

## 2014-11-21 MED ORDER — SERTRALINE HCL 50 MG PO TABS
50.0000 mg | ORAL_TABLET | Freq: Every day | ORAL | Status: DC
  Administered 2014-11-21 – 2014-11-22 (×2): 50 mg via ORAL
  Filled 2014-11-21 (×2): qty 1

## 2014-11-21 MED ORDER — SERTRALINE HCL 50 MG PO TABS
50.0000 mg | ORAL_TABLET | Freq: Every day | ORAL | Status: DC
  Administered 2014-11-21: 50 mg via ORAL
  Filled 2014-11-21: qty 1

## 2014-11-21 MED ORDER — AMLODIPINE BESYLATE 10 MG PO TABS
10.0000 mg | ORAL_TABLET | Freq: Every day | ORAL | Status: DC
  Administered 2014-11-21 – 2014-11-22 (×2): 10 mg via ORAL
  Filled 2014-11-21 (×2): qty 1

## 2014-11-21 MED ORDER — SITAGLIPTIN PHOS-METFORMIN HCL 50-1000 MG PO TABS: 1.0000 | ORAL_TABLET | Freq: Two times a day (BID) | ORAL | Status: DC

## 2014-11-21 MED ORDER — TRAZODONE HCL 100 MG PO TABS
100.0000 mg | ORAL_TABLET | Freq: Every day | ORAL | Status: DC
  Administered 2014-11-21: 100 mg via ORAL
  Filled 2014-11-21: qty 1

## 2014-11-21 MED ORDER — PANTOPRAZOLE SODIUM 40 MG PO TBEC
40.0000 mg | DELAYED_RELEASE_TABLET | Freq: Two times a day (BID) | ORAL | Status: DC
  Administered 2014-11-21 – 2014-11-22 (×3): 40 mg via ORAL
  Filled 2014-11-21 (×3): qty 1

## 2014-11-21 MED ORDER — ATENOLOL 50 MG PO TABS
50.0000 mg | ORAL_TABLET | Freq: Two times a day (BID) | ORAL | Status: DC
  Administered 2014-11-21 – 2014-11-22 (×3): 50 mg via ORAL
  Filled 2014-11-21 (×5): qty 1

## 2014-11-21 MED ORDER — CLONAZEPAM 0.5 MG PO TABS
0.5000 mg | ORAL_TABLET | Freq: Three times a day (TID) | ORAL | Status: DC | PRN
Start: 2014-11-21 — End: 2014-11-22
  Administered 2014-11-22: 0.5 mg via ORAL
  Filled 2014-11-21: qty 1

## 2014-11-21 MED ORDER — SUCRALFATE 1 G PO TABS
1.0000 g | ORAL_TABLET | Freq: Three times a day (TID) | ORAL | Status: DC
  Administered 2014-11-21 – 2014-11-22 (×5): 1 g via ORAL
  Filled 2014-11-21 (×9): qty 1

## 2014-11-21 MED ORDER — ROSUVASTATIN CALCIUM 10 MG PO TABS
10.0000 mg | ORAL_TABLET | Freq: Every day | ORAL | Status: DC
  Administered 2014-11-21: 10 mg via ORAL
  Filled 2014-11-21 (×2): qty 1

## 2014-11-21 MED ORDER — PALIPERIDONE ER 6 MG PO TB24
6.0000 mg | ORAL_TABLET | Freq: Every day | ORAL | Status: DC
  Filled 2014-11-21: qty 1

## 2014-11-21 MED ORDER — METFORMIN HCL 500 MG PO TABS
1000.0000 mg | ORAL_TABLET | Freq: Two times a day (BID) | ORAL | Status: DC
  Administered 2014-11-21 – 2014-11-22 (×3): 1000 mg via ORAL
  Filled 2014-11-21 (×5): qty 2

## 2014-11-21 NOTE — Progress Notes (Signed)
Pt. Presents IVC by Vesta Mixer who reports pt.( Stated she was not eating but was praying instead and was hearing denomic voices).  Pt admits to Clinical research associate that she hears voices and has for years but she can control them and at this point in her life realizes what they are.  Pt. Denies SI to writer but states "I am very depressed".  Pt. Is tearful as she speaks of her children.  Pt. Reports she is the Mother but the children care for her and this upsets the pt. Pt. Is also tearful because one of her Daughters is pregnant and has been bleeding.  Pt. Is concerned she will loose the baby.  Pt. Also reports anxiety due to her multiple medical issues that include uncontrolled type II diabetes, COPD, alopecia, HTN, CAD, GERD, anemia, and gastritis.  Pt. Also reports that she is in renal failure and will not go on dialysis, but writer did not find this in pt.'s history.  Pt. Was offered food and fluids which she refused.  Pt. Is currently resting quietly.

## 2014-11-21 NOTE — BH Assessment (Addendum)
Tele Assessment Note   Cheyenne Alvarez is an 49 y.o. female who came to [facility] under IVC from [facility] because she is depressed, not eating and is hearing voices and seeing things which is chronic for her. Her current stressor is the fact that her oldest daughter is currently pregnant and has been trying to have a baby for a very long time, but she started bleed ing yesterday, and pt is extremely concerned that she will lose the baby. Pt states that she has been fasting and praying about the baby and has constant thoughts and prayers that God would take her so that the her daughter can keep the baby.  Pt states, "I know that's not how it works, but I can't stop thinking about it".  "I just can't deal with it if anything happens to this baby--I don't want to know".  Pt is also anxious about her health issues and the impact they are having on her and her mental state.  She denies having a plan or intent to harm herself nor any history of past attempts. Pt states that she is a pt at [facility] and they have been trying to adjust her medications recently. She also states that she has a meeting set up this week for an ACTT team.  She states that she is having conflict with her kids because they try to protect her.  During interview, pt was calm , cooperative and dressed in paper scrubs.  She was alert and oriented. Pt had anxious affect and depressed mood. She became tearful at times, had normal speech, movement. Pt denies HI, admits to SI, AVH.   Disposition pending psych rounding.    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 Anxiety   . GERD (gastroesophageal reflux disease)   . Bipolar 1 disorder   . Diabetes mellitus 2010  . Hypertension 2010  . Depression   . PTSD (post-traumatic stress disorder)   . Anemia   . Obesity   . Tobacco use   . PONV (postoperative nausea and vomiting)   . Gastritis   . Pneumonia    . Coronary artery disease   . COPD (chronic obstructive pulmonary disease)   . Schizophrenia   . Headache     migraines  . Alopecia    Axis IV: other psychosocial or environmental problems and problems with primary support group Axis V: 41-50 serious symptoms  Past Medical History:  Past Medical History  Diagnosis Date  . Anxiety   . GERD (gastroesophageal reflux disease)   . Bipolar 1 disorder   . Diabetes mellitus 2010  . Hypertension 2010  . Depression   . PTSD (post-traumatic stress disorder)   . Anemia   . Obesity   . Tobacco use   . PONV (postoperative nausea and vomiting)   . Gastritis   . Pneumonia   . Coronary artery disease   . COPD (chronic obstructive pulmonary disease)   . Schizophrenia   . Headache     migraines  . Alopecia     Past Surgical History  Procedure Laterality Date  . Abdominal hysterectomy    . Leg surgery    . Cholecystectomy    . Tubal ligation    . Cardiac catheterization    . Colonoscopy    . Fracture surgery      left leg  . Colonoscopy with propofol N/A 05/14/2014    Procedure:  COLONOSCOPY WITH PROPOFOL;  Surgeon: Charlott Rakes, MD;  Location: [facility] ENDOSCOPY;  Service: Endoscopy;  Laterality: N/A;  . Esophagogastroduodenoscopy (egd) with propofol N/A 05/14/2014    Procedure: ESOPHAGOGASTRODUODENOSCOPY (EGD) WITH PROPOFOL;  Surgeon: Charlott Rakes, MD;  Location: [facility] ENDOSCOPY;  Service: Endoscopy;  Laterality: N/A;    Family History:  Family History  Problem Relation Age of Onset  . Diabetes Mother   . Kidney failure Mother   . Diabetes Other   . Cancer Other   . Hypertension Other     Social History:  reports that she has been smoking.  She has never used smokeless tobacco. She reports that she does not drink alcohol or use illicit drugs.  Additional Social History:  Alcohol / Drug Use Pain Medications: denies Prescriptions: denies Over the Counter: denies History of alcohol / drug use?: No history of alcohol / drug  abuse Longest period of sobriety (when/how long): denies Negative Consequences of Use:  (denies) Withdrawal Symptoms:  (denies)  CIWA: CIWA-Ar BP: 122/72 mmHg Pulse Rate: 73 COWS:    PATIENT STRENGTHS: (choose at least two) Ability for insight Average or above average intelligence Capable of independent living Communication skills Motivation for treatment/growth Supportive family/friends  Allergies:  Allergies  Allergen Reactions  . Dexilant [Dexlansoprazole] Other (See Comments)    Chest pain, sob, diarrhea, abd pain, vomiting  . Famotidine Anaphylaxis  . Meperidine Hcl Anaphylaxis  . Gabapentin Other (See Comments)    Memory loss  . Ramipril     angioedema  . Invokana [Canagliflozin] Rash  . Iohexol Itching and Rash  . Tape Rash    Use paper tape only     Home Medications:  (Not in a hospital admission)  OB/GYN Status:  No LMP recorded. Patient has had a hysterectomy.  General Assessment Data Location of Assessment: [facility] TTS Assessment: In system Is this a Tele or Face-to-Face Assessment?: Face-to-Face Is this an Initial Assessment or a Re-assessment for this encounter?: Initial Assessment Marital status: Single Is patient pregnant?: No Pregnancy Status: No Living Arrangements: Alone Can pt return to current living arrangement?: Yes Admission Status: Involuntary Is patient capable of signing voluntary admission?: Yes Referral Source:  Museum/gallery curator) Insurance type: [facility]     Crisis Care Plan Living Arrangements: Alone Name of Psychiatrist: Transport planner Name of Therapist: Transport planner  Education Status Is patient currently in school?: No  Risk to self with the past 6 months Suicidal Ideation: Yes-Currently Present Has patient been a risk to self within the past 6 months prior to admission? : No Suicidal Intent: No Has patient had any suicidal intent within the past 6 months prior to admission? : No Is patient at risk for suicide?: Yes Suicidal Plan?: No Has  patient had any suicidal plan within the past 6 months prior to admission? : No Access to Means: No What has been your use of drugs/alcohol within the last 12 months?: denies Previous Attempts/Gestures: No Intentional Self Injurious Behavior: None Family Suicide History: Unknown Recent stressful life event(s):  (daughter health problems, conflict with children) Persecutory voices/beliefs?: No Depression: Yes Depression Symptoms: Insomnia Substance abuse history and/or treatment for substance abuse?: No Suicide prevention information given to non-admitted patients: Not applicable  Risk to Others within the past 6 months Homicidal Ideation: No Does patient have any lifetime risk of violence toward others beyond the six months prior to admission? : No Thoughts of Harm to Others: No Current Homicidal Intent: No Current Homicidal Plan: No Access to Homicidal Means: No History of harm  to others?: No Assessment of Violence: None Noted Does patient have access to weapons?: No Criminal Charges Pending?: No Does patient have a court date: No Is patient on probation?: No  Psychosis Hallucinations: Auditory, Visual Delusions: Unspecified  Mental Status Report Appearance/Hygiene: In scrubs Eye Contact: Good Motor Activity: Freedom of movement, Unremarkable Speech: Logical/coherent Level of Consciousness: Alert Mood: Depressed, Sad, Anxious Affect: Anxious, Depressed Anxiety Level: Panic Attacks Panic attack frequency: daily Most recent panic attack: today Thought Processes: Coherent, Relevant Judgement: Impaired Orientation: Person, Place, Time, Situation, Appropriate for developmental age Obsessive Compulsive Thoughts/Behaviors: None  Cognitive Functioning Concentration: Decreased Memory: Recent Intact, Remote Intact IQ: Average Insight: Fair Impulse Control: Good Appetite: Poor Weight Gain: 10 Sleep: Decreased Total Hours of Sleep: 3 Vegetative Symptoms:  None  ADLScreening [facility] Assessment Services) Patient's cognitive ability adequate to safely complete daily activities?: Yes Patient able to express need for assistance with ADLs?: Yes Independently performs ADLs?: Yes (appropriate for developmental age)  Prior Inpatient Therapy Prior Inpatient Therapy: Yes Prior Therapy Dates:  [facility] others) Prior Therapy Facilty/Provider(s):  [facility]) Reason for Treatment: depression, psychosis  Prior Outpatient Therapy Prior Outpatient Therapy: Yes Prior Therapy Dates: years Prior Therapy Facilty/Provider(s): [facility] Reason for Treatment: psychosis Does patient have an ACCT team?: Unknown (meeting with one this week) Does patient have Intensive In-House Services?  : No Does patient have [facility] services? : Yes Does patient have P4CC services?: No  ADL Screening (condition at time of admission) Patient's cognitive ability adequate to safely complete daily activities?: Yes Is the patient deaf or have difficulty hearing?: No Does the patient have difficulty seeing, even when wearing glasses/contacts?: No Does the patient have difficulty concentrating, remembering, or making decisions?: Yes Patient able to express need for assistance with ADLs?: Yes Does the patient have difficulty dressing or bathing?: No Independently performs ADLs?: Yes (appropriate for developmental age) Does the patient have difficulty walking or climbing stairs?: No Weakness of Legs: None Weakness of Arms/Hands: None  Home Assistive Devices/Equipment Home Assistive Devices/Equipment: None    Abuse/Neglect Assessment (Assessment to be complete while patient is alone) Physical Abuse: Denies Verbal Abuse: Denies Sexual Abuse: Denies Exploitation of patient/patient's resources: Denies Self-Neglect: Denies Values / Beliefs Cultural Requests During Hospitalization: None Spiritual Requests During Hospitalization: None   Advance Directives (For Healthcare) Does patient have  an advance directive?: No Would patient like information on creating an advanced directive?: No - patient declined information    Additional Information 1:1 In Past 12 Months?: No CIRT Risk: No Elopement Risk: No Does patient have medical clearance?: Yes     Disposition:  Disposition Initial Assessment Completed for this Encounter: Yes Disposition of Patient: Inpatient treatment program Type of inpatient treatment program: Adult  [facility] 11/21/2014 9:18 AM

## 2014-11-21 NOTE — ED Provider Notes (Signed)
CSN: [id number]     Arrival date & time 11/21/14  0045 History   First MD Initiated Contact with Patient 11/21/14 0054     Chief Complaint  Patient presents with  . Medical Clearance    (Consider location/radiation/quality/duration/timing/severity/associated sxs/prior Treatment) HPI Comments: 49 year old female with a history of anxiety, bipolar 1 disorder, diabetes mellitus, hypertension, depression, PTSD, CAD, COPD, and schizophrenia presents to the emergency department for psychiatric evaluation. Patient IVC'd by [employer]. Vesta Mixer states that patient isn't eating. Papers report that patient is praying instead and hearing demonic voices. Patient reports that she feels as though she has nothing to live for. She does agree that she has been more depressed lately. She states that her eldest daughter is currently pregnant and may be miscarrying which triggered her worsening depression recently. Patient does state that she hears and sees things on occasion. She states that she hears voices telling her that she is worthless. She denies any suicidal ideations or suicidal intent. No homicidal ideations, alcohol use, or illicit drug use. Patient states that she has not been eating because she has been depressed, suppressing her appetite.  The history is provided by the patient. No language interpreter was used.    Past Medical History  Diagnosis Date  . Anxiety   . GERD (gastroesophageal reflux disease)   . Bipolar 1 disorder   . Diabetes mellitus 2010  . Hypertension 2010  . Depression   . PTSD (post-traumatic stress disorder)   . Anemia   . Obesity   . Tobacco use   . PONV (postoperative nausea and vomiting)   . Gastritis   . Pneumonia   . Coronary artery disease   . COPD (chronic obstructive pulmonary disease)   . Schizophrenia   . Headache     migraines  . Alopecia    Past Surgical History  Procedure Laterality Date  . Abdominal hysterectomy    . Leg surgery    . Cholecystectomy     . Tubal ligation    . Cardiac catheterization    . Colonoscopy    . Fracture surgery      left leg  . Colonoscopy with propofol N/A 05/14/2014    Procedure: COLONOSCOPY WITH PROPOFOL;  Surgeon: Charlott Rakes, MD;  Location: [facility] ENDOSCOPY;  Service: Endoscopy;  Laterality: N/A;  . Esophagogastroduodenoscopy (egd) with propofol N/A 05/14/2014    Procedure: ESOPHAGOGASTRODUODENOSCOPY (EGD) WITH PROPOFOL;  Surgeon: Charlott Rakes, MD;  Location: [facility] ENDOSCOPY;  Service: Endoscopy;  Laterality: N/A;   Family History  Problem Relation Age of Onset  . Diabetes Mother   . Kidney failure Mother   . Diabetes Other   . Cancer Other   . Hypertension Other    Social History  Substance Use Topics  . Smoking status: Current Some Day Smoker  . Smokeless tobacco: Never Used  . Alcohol Use: No   OB History    No data available      Review of Systems  Psychiatric/Behavioral: Positive for behavioral problems.  All other systems reviewed and are negative.   Allergies  Dexilant; Famotidine; Meperidine hcl; Gabapentin; Ramipril; Invokana; Iohexol; and Tape  Home Medications   Prior to Admission medications   Medication Sig Start Date End Date Taking? Authorizing Provider  albuterol (PROVENTIL HFA;VENTOLIN HFA) 108 (90 BASE) MCG/ACT inhaler Inhale 2 puffs into the lungs every 6 (six) hours as needed for wheezing. For wheezing 11/04/14  Yes Kermit Balo Tysinger, PA-C  amLODipine (NORVASC) 10 MG tablet Take 1 tablet (10 mg total)  by mouth daily. 11/04/14  Yes Kermit Balo Tysinger, PA-C  atenolol (TENORMIN) 50 MG tablet Take 1 tablet (50 mg total) by mouth 2 (two) times daily. 11/04/14  Yes Kermit Balo Tysinger, PA-C  BENICAR 40 MG tablet take 1 tablet by mouth once daily 10/22/14  Yes Kermit Balo Tysinger, PA-C  clonazePAM (KLONOPIN) 0.5 MG tablet Take 0.5 mg by mouth 3 (three) times daily as needed for anxiety.    Yes Historical Provider, MD  diphenhydrAMINE (BENADRYL) 25 mg capsule Take 50 mg by mouth at bedtime.    Yes Historical Provider, MD  EPINEPHrine (EPIPEN 2-PAK) 0.3 mg/0.3 mL IJ SOAJ injection Inject 0.3 mLs (0.3 mg total) into the muscle once. 03/13/14  Yes Purvis Sheffield, MD  Exenatide ER 2 MG PEN Inject 2 mg into the skin once a week. 11/04/14  Yes Kermit Balo Tysinger, PA-C  Fluticasone-Salmeterol (ADVAIR DISKUS) 250-50 MCG/DOSE AEPB Inhale 1 puff into the lungs 2 (two) times daily. 11/04/14  Yes Kermit Balo Tysinger, PA-C  furosemide (LASIX) 20 MG tablet Take 1 tablet (20 mg total) by mouth daily. 11/04/14  Yes Kermit Balo Tysinger, PA-C  Insulin Glargine (LANTUS SOLOSTAR) 100 UNIT/ML Solostar Pen Inject 20 Units into the skin daily at 10 pm. 11/04/14  Yes Kermit Balo Tysinger, PA-C  nitroGLYCERIN (NITROSTAT) 0.4 MG SL tablet Place 0.4 mg under the tongue every 5 (five) minutes as needed for chest pain.   Yes Historical Provider, MD  olmesartan (BENICAR) 40 MG tablet Take 1 tablet (40 mg total) by mouth daily. 11/04/14  Yes Kermit Balo Tysinger, PA-C  paliperidone (INVEGA) 6 MG 24 hr tablet Take 6 mg by mouth at bedtime.   Yes Historical Provider, MD  pantoprazole (PROTONIX) 40 MG tablet Take 1 tablet (40 mg total) by mouth 2 (two) times daily. 09/11/14  Yes Kermit Balo Tysinger, PA-C  polyethylene glycol (MIRALAX / GLYCOLAX) packet Take 17 g by mouth daily. 08/31/14  Yes Fayrene Helper, PA-C  rosuvastatin (CRESTOR) 10 MG tablet Take 1 tablet (10 mg total) by mouth daily. 11/04/14  Yes Kermit Balo Tysinger, PA-C  sertraline (ZOLOFT) 50 MG tablet Take 50 mg by mouth daily.   Yes Historical Provider, MD  sitaGLIPtin-metformin (JANUMET) 50-1000 MG per tablet Take 1 tablet by mouth 2 (two) times daily with a meal. 11/04/14  Yes Kermit Balo Tysinger, PA-C  sucralfate (CARAFATE) 1 G tablet Take 1 g by mouth 4 (four) times daily -  with meals and at bedtime.   Yes Historical Provider, MD  traZODone (DESYREL) 100 MG tablet Take 100 mg by mouth at bedtime.   Yes Historical Provider, MD  amLODipine (NORVASC) 10 MG tablet take 1 tablet by mouth once  daily Patient not taking: Reported on 11/21/2014 11/04/14   Kermit Balo Tysinger, PA-C  atenolol (TENORMIN) 50 MG tablet take 1 tablet by mouth twice a day Patient not taking: Reported on 11/21/2014 11/04/14   Kermit Balo Tysinger, PA-C  furosemide (LASIX) 20 MG tablet take 1 tablet by mouth once daily Patient not taking: Reported on 11/21/2014 11/04/14   Kermit Balo Tysinger, PA-C   BP 125/79 mmHg  Pulse 77  Temp(Src) 98.4 F (36.9 C) (Oral)  Resp 20  SpO2 97%   Physical Exam  Constitutional: She is oriented to person, place, and time. She appears well-developed and well-nourished. No distress.  HENT:  Head: Normocephalic and atraumatic.  Eyes: Conjunctivae and EOM are normal. No scleral icterus.  Neck: Normal range of motion.  Pulmonary/Chest: Effort normal. No respiratory distress.  Musculoskeletal: Normal range of motion.  Neurological: She is alert and oriented to person, place, and time. She exhibits normal muscle tone. Coordination normal.  Skin: Skin is warm and dry. No rash noted. She is not diaphoretic. No erythema. No pallor.  Psychiatric: Her speech is normal and behavior is normal. Cognition and memory are normal. She exhibits a depressed mood. She expresses no homicidal and no suicidal ideation. She expresses no suicidal plans and no homicidal plans.  Nursing note and vitals reviewed.   ED Course  Procedures (including critical care time) Labs Review Labs Reviewed  COMPREHENSIVE METABOLIC PANEL - Abnormal; Notable for the following:    Potassium 3.4 (*)    Glucose, Bld 123 (*)    Creatinine, Ser 1.03 (*)    Calcium 8.8 (*)    Total Protein 8.3 (*)    Total Bilirubin <0.1 (*)    All other components within normal limits  CBC - Abnormal; Notable for the following:    WBC 13.1 (*)    Platelets 444 (*)    All other components within normal limits  URINE RAPID DRUG SCREEN, HOSP PERFORMED - Abnormal; Notable for the following:    Benzodiazepines POSITIVE (*)    All other components  within normal limits  ETHANOL  CBG MONITORING, ED    Imaging Review No results found.   I have personally reviewed and evaluated these images and lab results as part of my medical decision-making.   EKG Interpretation None      MDM   Final diagnoses:  Other depressive episodes    49 year old female presents to the emergency department. IVC papers taken out by [facility]. Patient endorses depression and a feeling of helplessness and worthlessness. She denies any suicidal intent. Patient pending TTS evaluation. She has been medically cleared. Disposition to be determined by oncoming ED provider.   Filed Vitals:   11/21/14 0058  BP: 125/79  Pulse: 77  Temp: 98.4 F (36.9 C)  TempSrc: Oral  Resp: 20  SpO2: 97%     Antony Madura, PA-C 11/21/14 6962  Gerhard Munch, MD 11/21/14 [phone]

## 2014-11-21 NOTE — ED Notes (Signed)
Pt states that she has anxiety issues and her kids are driving her crazy and she needs a break, she went to [facility] to talk to someone and they sent her here.

## 2014-11-21 NOTE — Progress Notes (Signed)
D Pt. Denies SI and HI, no complaints of pain or discomfort noted this pm.  Pt. Did have company today but reports she does not feel the company is helpful at present time.    A Writer offered support and encouragement, Discussed patients day with her.    R Pt. Remains safe on the unit.  Pt. Continues to be sad and depressed d/t her mental illness.  Pt. Is alert and oriented to place and person.  Pt did refuse her lantus d/t lack of appetite, reporting she is concerned her blood sugar will bottom out as it has in the past.  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

## 2014-11-21 NOTE — Patient Outreach (Addendum)
Cheyenne Alvarez is a 49 y.o. female referred to pharmacy for medication management related to medication adherence. Called for initial outreach call to patient, but noted per EPIC, patient is currently admitted at [facility] as of early this morning. Left a HIPAA compliant message on the patient's voicemail. Will give the patient another call on 11/24/14.  Duanne Moron, PharmD Clinical Pharmacist [facility] Care Management [phone]

## 2014-11-21 NOTE — Consult Note (Signed)
[facility] Consult   Reason for Consult:  Hallucinations Referring Physician:  EDP Patient Identification: Cheyenne Alvarez MRN:  [id number] Principal Diagnosis: <principal problem not specified> Diagnosis:   Patient Active Problem List   Diagnosis Date Noted  . Schizoaffective disorder [F25.9] 11/21/2014    Priority: High  . Diabetes type 2, uncontrolled [E11.65] 07/08/2014  . Essential hypertension [I10] 07/08/2014  . Vomiting [R11.10] 05/14/2014  . Abdominal pain, generalized [R10.84] 05/14/2014  . Diarrhea [R19.7] 05/14/2014  . CAP (community acquired pneumonia) [J18.9] 02/20/2013  . Viral syndrome [B34.9] 02/20/2013  . ACE inhibitor-aggravated angioedema [T78.3XXA, T46.4X5A] 08/14/2012  . Acute respiratory failure with hypoxia [J96.01] 08/14/2012  . ANEMIA [D64.9] 08/05/2009  . CANDIDIASIS, ORAL [B37.0] 08/03/2009  . OBESITY [E66.9] 08/03/2009  . TOBACCO ABUSE [Z72.0] 08/03/2009  . MICROSCOPIC HEMATURIA [R31.2] 08/03/2009  . DIABETES MELLITUS [E11.9] 04/06/2009  . BIPOLAR AFFECTIVE DISORDER [F31.9] 04/06/2009  . ANXIETY [F41.1] 04/06/2009  . 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 HYPERTENSION, BENIGN ESSENTIAL [I10] 04/06/2009  . GERD [K21.9] 04/06/2009  . MENOPAUSE, SURGICAL [N95.8] 04/06/2009  . LEG CRAMPS [R25.2] 04/06/2009  . CHOLECYSTECTOMY, HX OF [Z90.89] 04/06/2009  . PNEUMONIA [J18.9] 03/24/2009    Total Time spent with patient: 45 minutes  Subjective:   Cheyenne Alvarez is a 49 y.o. female patient admitted with psychosis.  HPI:  49 yo female presents to the ED with auditory hallucinations, not eating, increase in anxiety, history of schizophrenia.  Slow to respond to questions, depressed, poor eye contact.  Patient at [facility] and [place] in the past but not in a few years, her adult children manage her medications. HPI Elements:   Location:  generalized. Quality:  acute. Severity:  severe. Timing:  constant. Duration:   couple of days. Context:  stressors.  Past Medical History:  Past Medical History  Diagnosis Date  . Anxiety   . GERD (gastroesophageal reflux disease)   . Bipolar 1 disorder   . Diabetes mellitus 2010  . Hypertension 2010  . Depression   . PTSD (post-traumatic stress disorder)   . Anemia   . Obesity   . Tobacco use   . PONV (postoperative nausea and vomiting)   . Gastritis   . Pneumonia   . Coronary artery disease   . COPD (chronic obstructive pulmonary disease)   . Schizophrenia   . Headache     migraines  . Alopecia     Past Surgical History  Procedure Laterality Date  . Abdominal hysterectomy    . Leg surgery    . Cholecystectomy    . Tubal ligation    . Cardiac catheterization    . Colonoscopy    . Fracture surgery      left leg  . Colonoscopy with propofol N/A 05/14/2014    Procedure: COLONOSCOPY WITH PROPOFOL;  Surgeon: Wilford Corner, MD;  Location: [facility] ENDOSCOPY;  Service: Endoscopy;  Laterality: N/A;  . Esophagogastroduodenoscopy (egd) with propofol N/A 05/14/2014    Procedure: ESOPHAGOGASTRODUODENOSCOPY (EGD) WITH PROPOFOL;  Surgeon: Wilford Corner, MD;  Location: [facility] ENDOSCOPY;  Service: Endoscopy;  Laterality: N/A;   Family History:  Family History  Problem Relation Age of Onset  . Diabetes Mother   . Kidney failure Mother   . Diabetes Other   . Cancer Other   . Hypertension Other    Social History:  History  Alcohol Use No     History  Drug Use No    Social History   Social History  . Marital Status:  Divorced    Spouse Name: N/A  . Number of Children: N/A  . Years of Education: N/A   Social History Main Topics  . Smoking status: Current Some Day Smoker  . Smokeless tobacco: Never Used  . Alcohol Use: No  . Drug Use: No  . Sexual Activity: Not Asked   Other Topics Concern  . None   Social History Narrative   Additional Social History:    Pain Medications: denies Prescriptions: denies Over the Counter: denies History of  alcohol / drug use?: No history of alcohol / drug abuse Longest period of sobriety (when/how long): denies Negative Consequences of Use:  (denies) Withdrawal Symptoms:  (denies)                     Allergies:   Allergies  Allergen Reactions  . Dexilant [Dexlansoprazole] Other (See Comments)    Chest pain, sob, diarrhea, abd pain, vomiting  . Famotidine Anaphylaxis  . Meperidine Hcl Anaphylaxis  . Gabapentin Other (See Comments)    Memory loss  . Ramipril     angioedema  . Invokana [Canagliflozin] Rash  . Iohexol Itching and Rash  . Tape Rash    Use paper tape only     Labs:  Results for orders placed or performed during the hospital encounter of 11/21/14 (from the past 48 hour(s))  Comprehensive metabolic panel     Status: Abnormal   Collection Time: 11/21/14  1:05 AM  Result Value Ref Range   Sodium 136 135 - 145 mmol/L   Potassium 3.4 (L) 3.5 - 5.1 mmol/L   Chloride 102 101 - 111 mmol/L   CO2 25 22 - 32 mmol/L   Glucose, Bld 123 (H) 65 - 99 mg/dL   BUN 11 6 - 20 mg/dL   Creatinine, Ser 1.03 (H) 0.44 - 1.00 mg/dL   Calcium 8.8 (L) 8.9 - 10.3 mg/dL   Total Protein 8.3 (H) 6.5 - 8.1 g/dL   Albumin 4.1 3.5 - 5.0 g/dL   AST 19 15 - 41 U/L   ALT 14 14 - 54 U/L   Alkaline Phosphatase 88 38 - 126 U/L   Total Bilirubin <0.1 (L) 0.3 - 1.2 mg/dL   GFR calc non Af Amer >60 >60 mL/min   GFR calc Af Amer >60 >60 mL/min    Comment: (NOTE) The eGFR has been calculated using the CKD EPI equation. This calculation has not been validated in all clinical situations. eGFR's persistently <60 mL/min signify possible Chronic Kidney Disease.    Anion gap 9 5 - 15  CBC     Status: Abnormal   Collection Time: 11/21/14  1:05 AM  Result Value Ref Range   WBC 13.1 (H) 4.0 - 10.5 K/uL   RBC 4.45 3.87 - 5.11 MIL/uL   Hemoglobin 12.0 12.0 - 15.0 g/dL   HCT 36.2 36.0 - 46.0 %   MCV 81.3 78.0 - 100.0 fL   MCH 27.0 26.0 - 34.0 pg   MCHC 33.1 30.0 - 36.0 g/dL   RDW 15.5 11.5 - 15.5  %   Platelets 444 (H) 150 - 400 K/uL  Ethanol (ETOH)     Status: None   Collection Time: 11/21/14  1:05 AM  Result Value Ref Range   Alcohol, Ethyl (B) <5 <5 mg/dL    Comment:        LOWEST DETECTABLE LIMIT FOR SERUM ALCOHOL IS 5 mg/dL FOR MEDICAL PURPOSES ONLY   CBG monitoring, ED  Status: Abnormal   Collection Time: 11/21/14  1:12 AM  Result Value Ref Range   Glucose-Capillary 121 (H) 65 - 99 mg/dL  Urine rapid drug screen (hosp performed) (Not at [facility])     Status: Abnormal   Collection Time: 11/21/14  2:30 AM  Result Value Ref Range   Opiates NONE DETECTED NONE DETECTED   Cocaine NONE DETECTED NONE DETECTED   Benzodiazepines POSITIVE (A) NONE DETECTED   Amphetamines NONE DETECTED NONE DETECTED   Tetrahydrocannabinol NONE DETECTED NONE DETECTED   Barbiturates NONE DETECTED NONE DETECTED    Comment:        DRUG SCREEN FOR MEDICAL PURPOSES ONLY.  IF CONFIRMATION IS NEEDED FOR ANY PURPOSE, NOTIFY LAB WITHIN 5 DAYS.        LOWEST DETECTABLE LIMITS FOR URINE DRUG SCREEN Drug Class       Cutoff (ng/mL) Amphetamine      1000 Barbiturate      200 Benzodiazepine   702 Tricyclics       637 Opiates          300 Cocaine          300 THC              50   CBG monitoring, ED     Status: Abnormal   Collection Time: 11/21/14  7:41 AM  Result Value Ref Range   Glucose-Capillary 121 (H) 65 - 99 mg/dL    Vitals: Blood pressure 112/67, pulse 83, temperature 98.2 F (36.8 C), temperature source Oral, resp. rate 16, SpO2 100 %.  Risk to Self: Suicidal Ideation: Yes-Currently Present Suicidal Intent: No Is patient at risk for suicide?: Yes Suicidal Plan?: No Access to Means: No What has been your use of drugs/alcohol within the last 12 months?: denies Intentional Self Injurious Behavior: None Risk to Others: Homicidal Ideation: No Thoughts of Harm to Others: No Current Homicidal Intent: No Current Homicidal Plan: No Access to Homicidal Means: No History of harm to others?:  No Assessment of Violence: None Noted Does patient have access to weapons?: No Criminal Charges Pending?: No Does patient have a court date: No Prior Inpatient Therapy: Prior Inpatient Therapy: Yes Prior Therapy Dates:  [facility], others) Prior Therapy Facilty/Zalaya Astarita(s):  [facility]) Reason for Treatment: depression, psychosis Prior Outpatient Therapy: Prior Outpatient Therapy: Yes Prior Therapy Dates: years Prior Therapy Facilty/Sholanda Croson(s): [facility] Reason for Treatment: psychosis Does patient have an ACCT team?: Unknown (meeting with one this week) Does patient have Intensive In-House Services?  : No Does patient have [facility] services? : Yes Does patient have [facility] services?: No  Current Facility-Administered Medications  Medication Dose Route Frequency Alaria Oconnor Last Rate Last Dose  . amLODipine (NORVASC) tablet 10 mg  10 mg Oral Daily Antonietta Breach, PA-C   10 mg at 11/21/14 1002  . atenolol (TENORMIN) tablet 50 mg  50 mg Oral BID Antonietta Breach, PA-C   50 mg at 11/21/14 1002  . clonazePAM (KLONOPIN) tablet 0.5 mg  0.5 mg Oral TID PRN Antonietta Breach, PA-C      . furosemide (LASIX) tablet 20 mg  20 mg Oral Daily Antonietta Breach, PA-C   20 mg at 11/21/14 1002  . insulin glargine (LANTUS) injection 20 Units  20 Units Subcutaneous Q2200 Antonietta Breach, PA-C      . irbesartan (AVAPRO) tablet 300 mg  300 mg Oral Daily Antonietta Breach, PA-C   300 mg at 11/21/14 1002  . linagliptin (TRADJENTA) tablet 5 mg  5 mg Oral Q breakfast Carmin Muskrat, MD  5 mg at 11/21/14 0853  . metFORMIN (GLUCOPHAGE) tablet 1,000 mg  1,000 mg Oral BID WC Carmin Muskrat, MD   1,000 mg at 11/21/14 0851  . mometasone-formoterol (DULERA) 100-5 MCG/ACT inhaler 2 puff  2 puff Inhalation BID Antonietta Breach, PA-C   2 puff at 11/21/14 0854  . paliperidone (INVEGA) 24 hr tablet 6 mg  6 mg Oral QHS Antonietta Breach, PA-C      . pantoprazole (PROTONIX) EC tablet 40 mg  40 mg Oral BID Antonietta Breach, PA-C   40 mg at 11/21/14 1002  . rosuvastatin (CRESTOR) tablet  10 mg  10 mg Oral Daily Antonietta Breach, PA-C      . sertraline (ZOLOFT) tablet 50 mg  50 mg Oral Daily Antonietta Breach, PA-C   50 mg at 11/21/14 1002  . sucralfate (CARAFATE) tablet 1 g  1 g Oral TID WC & HS Antonietta Breach, PA-C   1 g at 11/21/14 1255  . traZODone (DESYREL) tablet 100 mg  100 mg Oral QHS Antonietta Breach, PA-C       Current Outpatient Prescriptions  Medication Sig Dispense Refill  . albuterol (PROVENTIL HFA;VENTOLIN HFA) 108 (90 BASE) MCG/ACT inhaler Inhale 2 puffs into the lungs every 6 (six) hours as needed for wheezing. For wheezing 18 g 0  . amLODipine (NORVASC) 10 MG tablet Take 1 tablet (10 mg total) by mouth daily. 90 tablet 0  . atenolol (TENORMIN) 50 MG tablet Take 1 tablet (50 mg total) by mouth 2 (two) times daily. 180 tablet 0  . BENICAR 40 MG tablet take 1 tablet by mouth once daily 30 tablet 2  . clonazePAM (KLONOPIN) 0.5 MG tablet Take 0.5 mg by mouth 3 (three) times daily as needed for anxiety.     . diphenhydrAMINE (BENADRYL) 25 mg capsule Take 50 mg by mouth at bedtime.    Marland Kitchen EPINEPHrine (EPIPEN 2-PAK) 0.3 mg/0.3 mL IJ SOAJ injection Inject 0.3 mLs (0.3 mg total) into the muscle once. 1 Device 2  . Exenatide ER 2 MG PEN Inject 2 mg into the skin once a week. 4 each 3  . Fluticasone-Salmeterol (ADVAIR DISKUS) 250-50 MCG/DOSE AEPB Inhale 1 puff into the lungs 2 (two) times daily. 60 each 5  . furosemide (LASIX) 20 MG tablet Take 1 tablet (20 mg total) by mouth daily. 90 tablet 0  . Insulin Glargine (LANTUS SOLOSTAR) 100 UNIT/ML Solostar Pen Inject 20 Units into the skin daily at 10 pm. 5 pen 3  . nitroGLYCERIN (NITROSTAT) 0.4 MG SL tablet Place 0.4 mg under the tongue every 5 (five) minutes as needed for chest pain.    Marland Kitchen olmesartan (BENICAR) 40 MG tablet Take 1 tablet (40 mg total) by mouth daily. 90 tablet 0  . paliperidone (INVEGA) 6 MG 24 hr tablet Take 6 mg by mouth at bedtime.    . pantoprazole (PROTONIX) 40 MG tablet Take 1 tablet (40 mg total) by mouth 2 (two) times  daily. 40 tablet 3  . polyethylene glycol (MIRALAX / GLYCOLAX) packet Take 17 g by mouth daily. 14 each 0  . rosuvastatin (CRESTOR) 10 MG tablet Take 1 tablet (10 mg total) by mouth daily. 90 tablet 0  . sertraline (ZOLOFT) 50 MG tablet Take 50 mg by mouth daily.    . sitaGLIPtin-metformin (JANUMET) 50-1000 MG per tablet Take 1 tablet by mouth 2 (two) times daily with a meal. 60 tablet 1  . sucralfate (CARAFATE) 1 G tablet Take 1 g by mouth 4 (four) times daily -  with meals and at bedtime.    . traZODone (DESYREL) 100 MG tablet Take 100 mg by mouth at bedtime.    Marland Kitchen amLODipine (NORVASC) 10 MG tablet take 1 tablet by mouth once daily (Patient not taking: Reported on 11/21/2014) 30 tablet 0  . atenolol (TENORMIN) 50 MG tablet take 1 tablet by mouth twice a day (Patient not taking: Reported on 11/21/2014) 60 tablet 2  . furosemide (LASIX) 20 MG tablet take 1 tablet by mouth once daily (Patient not taking: Reported on 11/21/2014) 30 tablet 2    Musculoskeletal: Strength & Muscle Tone: within normal limits Gait & Station: normal Patient leans: N/A  Psychiatric Specialty Exam: Physical Exam  Review of Systems  Constitutional: Negative.   HENT: Negative.   Eyes: Negative.   Respiratory: Negative.   Cardiovascular: Negative.   Gastrointestinal: Negative.   Genitourinary: Negative.   Musculoskeletal: Negative.   Skin: Negative.   Neurological: Negative.   Endo/Heme/Allergies: Negative.     Blood pressure 112/67, pulse 83, temperature 98.2 F (36.8 C), temperature source Oral, resp. rate 16, SpO2 100 %.There is no weight on file to calculate BMI.  General Appearance: Disheveled  Eye Contact::  Poor  Speech:  Slow  Volume:  Decreased  Mood:  Depressed  Affect:  Flat  Thought Process:  Coherent  Orientation:  Full (Time, Place, and Person)  Thought Content:  Hallucinations: Auditory  Suicidal Thoughts:  Yes.  with intent/plan  Homicidal Thoughts:  No  Memory:  Immediate;   Fair Recent;    Fair Remote;   Fair  Judgement:  Impaired  Insight:  Fair  Psychomotor Activity:  Decreased  Concentration:  Fair  Recall:  [employer] of Knowledge:Fair  Language: Fair  Akathisia:  No  Handed:  Right  AIMS (if indicated):     Assets:  Housing Leisure Time Physical Health Resilience Social Support  ADL's:  Intact  Cognition: Impaired,  Moderate  Sleep:      Medical Decision Making: Review of Psycho-Social Stressors (1), Review or order clinical lab tests (1) and Review of Medication Regimen & Side Effects (2)  Treatment Plan Summary: Daily contact with patient to assess and evaluate symptoms and progress in treatment, Medication management and Plan :  Schizoaffective disorder, acute exacerbation, severe:  -Crisis stabilization -Individual counseling -Medication management:  Start Invega 6 mg daily for psychosis and Zoloft 50 mg daily for depression  Plan:  Recommend psychiatric Inpatient admission when medically cleared. Disposition: Admit to inpatient psychiatric unit for stabilization  Waylan Boga, pMH-NP 11/21/2014 1:12 PM Patient seen face-to-face for psychiatric evaluation, chart reviewed and case discussed with the physician extender and developed treatment plan. Reviewed the information documented and agree with the treatment plan. Corena Pilgrim, MD

## 2014-11-21 NOTE — ED Notes (Signed)
Pt is IVC'd by [employer], she isn't eating , praying instead and hearing demonic voices. Pt is an insulin dependent diabetic.

## 2014-11-21 NOTE — ED Notes (Signed)
Patient is A&Ox4, denies SI/HI, states she does have A/V hallucinations but these are normal for her and she knows how to control them. Patient spoke about having extremely high blood pressure and blood sugars, also spoke about being a Hospice nurse for 17 years and feeling embarressed that she no longer understand all the medical things she used to do.   Traxler, Wyman Songster, RN

## 2014-11-22 DIAGNOSIS — F250 Schizoaffective disorder, bipolar type: Secondary | ICD-10-CM | POA: Diagnosis not present

## 2014-11-22 LAB — CBG MONITORING, ED: GLUCOSE-CAPILLARY: 136 mg/dL — AB (ref 65–99)

## 2014-11-22 NOTE — [facility] Suicide Risk Assessment (Signed)
Suicide Risk Assessment  Discharge Assessment   [facility] Discharge Suicide Risk Assessment   Demographic Factors:  NA  Total Time spent with patient: 30 minutes  Musculoskeletal: Strength & Muscle Tone: within normal limits Gait & Station: normal Patient leans: N/A  Psychiatric Specialty Exam: Physical Exam  Review of Systems  Constitutional: Negative.   HENT: Negative.   Eyes: Negative.   Respiratory: Negative.   Cardiovascular: Negative.   Gastrointestinal: Negative.   Genitourinary: Negative.   Musculoskeletal: Negative.   Skin: Negative.   Neurological: Negative.   Endo/Heme/Allergies: Negative.   Psychiatric/Behavioral:       Negative    Blood pressure 117/67, pulse 84, temperature 98.4 F (36.9 C), temperature source Oral, resp. rate 21, SpO2 100 %.There is no weight on file to calculate BMI.  General Appearance: Casual  Eye Contact::  Good  Speech:  Normal   Volume:  Normal  Mood:  Euthymic  Affect:  Congruent  Thought Process:  Coherent  Orientation:  Full (Time, Place, and Person)  Thought Content:  WDL  Suicidal Thoughts:  No  Homicidal Thoughts:  No  Memory:Immediate-Good, REmote-Good, recent-good  Judgement:  Good  Insight:  Good  Psychomotor Activity:  Normal  Concentration:  Good  Recall: Good  Fund of Knowledge:  Good  Language:  Good  Akathisia:  No  Handed:  Right  AIMS (if indicated):     Assets:  Housing Leisure Time Physical Health Resilience Social Support  ADL's:  Intact  Cognition: WDL  Sleep:         Has this patient used any form of tobacco in the last 30 days? (Cigarettes, Smokeless Tobacco, Cigars, and/or Pipes) No  Mental Status Per Nursing Assessment::   On Admission:   Hallucinations  Current Mental Status by Physician: NA  Loss Factors: NA  Historical Factors: NA  Risk Reduction Factors:   Sense of responsibility to family, Living with another person, especially a relative, Positive social support and Positive  therapeutic relationship  Continued Clinical Symptoms:  None  Cognitive Features That Contribute To Risk:  None    Suicide Risk:  Minimal: No identifiable suicidal ideation.  Patients presenting with no risk factors but with morbid ruminations; may be classified as minimal risk based on the severity of the depressive symptoms  Principal Problem:  Schizoaffective disorder Discharge Diagnoses:  Patient Active Problem List   Diagnosis Date Noted  . Schizoaffective disorder [F25.9] 11/21/2014    Priority: High  . Schizoaffective disorder, bipolar type [F25.0]   . Diabetes type 2, uncontrolled [E11.65] 07/08/2014  . Essential hypertension [I10] 07/08/2014  . Vomiting [R11.10] 05/14/2014  . Abdominal pain, generalized [R10.84] 05/14/2014  . Diarrhea [R19.7] 05/14/2014  . CAP (community acquired pneumonia) [J18.9] 02/20/2013  . Viral syndrome [B34.9] 02/20/2013  . ACE inhibitor-aggravated angioedema [T78.3XXA, T46.4X5A] 08/14/2012  . Acute respiratory failure with hypoxia [J96.01] 08/14/2012  . ANEMIA [D64.9] 08/05/2009  . CANDIDIASIS, ORAL [B37.0] 08/03/2009  . OBESITY [E66.9] 08/03/2009  . TOBACCO ABUSE [Z72.0] 08/03/2009  . MICROSCOPIC HEMATURIA [R31.2] 08/03/2009  . DIABETES MELLITUS [E11.9] 04/06/2009  . BIPOLAR AFFECTIVE DISORDER [F31.9] 04/06/2009  . ANXIETY [F41.1] 04/06/2009  . 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 HYPERTENSION, BENIGN ESSENTIAL [I10] 04/06/2009  . GERD [K21.9] 04/06/2009  . MENOPAUSE, SURGICAL [N95.8] 04/06/2009  . LEG CRAMPS [R25.2] 04/06/2009  . CHOLECYSTECTOMY, HX OF [Z90.89] 04/06/2009  . PNEUMONIA [J18.9] 03/24/2009      Plan Of Care/Follow-up recommendations:  Activity:  as tolerated Diet:  heart healthy diet  Is patient on multiple antipsychotic therapies at discharge:  No   Has Patient had three or more failed trials of antipsychotic monotherapy by history:  No  Recommended Plan for Multiple Antipsychotic  Therapies: NA    LORD, JAMISON, PMH-NP 11/22/2014, 10:07 AM

## 2014-11-22 NOTE — Consult Note (Signed)
[facility] Consult   Reason for Consult:  Hallucinations Referring Physician:  EDP Patient Identification: Cheyenne Alvarez MRN:  [id number] Principal Diagnosis:Schizoaffective Disorder Diagnosis:   Patient Active Problem List   Diagnosis Date Noted  . Schizoaffective disorder [F25.9] 11/21/2014    Priority: High  . Diabetes type 2, uncontrolled [E11.65] 07/08/2014  . Essential hypertension [I10] 07/08/2014  . Vomiting [R11.10] 05/14/2014  . Abdominal pain, generalized [R10.84] 05/14/2014  . Diarrhea [R19.7] 05/14/2014  . CAP (community acquired pneumonia) [J18.9] 02/20/2013  . Viral syndrome [B34.9] 02/20/2013  . ACE inhibitor-aggravated angioedema [T78.3XXA, T46.4X5A] 08/14/2012  . Acute respiratory failure with hypoxia [J96.01] 08/14/2012  . ANEMIA [D64.9] 08/05/2009  . CANDIDIASIS, ORAL [B37.0] 08/03/2009  . OBESITY [E66.9] 08/03/2009  . TOBACCO ABUSE [Z72.0] 08/03/2009  . MICROSCOPIC HEMATURIA [R31.2] 08/03/2009  . DIABETES MELLITUS [E11.9] 04/06/2009  . BIPOLAR AFFECTIVE DISORDER [F31.9] 04/06/2009  . ANXIETY [F41.1] 04/06/2009  . 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 HYPERTENSION, BENIGN ESSENTIAL [I10] 04/06/2009  . GERD [K21.9] 04/06/2009  . MENOPAUSE, SURGICAL [N95.8] 04/06/2009  . LEG CRAMPS [R25.2] 04/06/2009  . CHOLECYSTECTOMY, HX OF [Z90.89] 04/06/2009  . PNEUMONIA [J18.9] 03/24/2009    Total Time spent with patient: 30 minutes Subjective:   Cheyenne Alvarez is a 49 y.o. female patient has stabilized.  HPI:  Patient states she feels, "fine".  "I have a lot of support.  I just got overwhelmed but realized I can't fix everything."  She is sitting up and eating breakfast, interacting with good eye contact.  Denies suicidal/homicidal ideations, hallucinations.  She requests to discharge and follow-up with her providers at [facility], agrees to return if her suicidal ideations return. HPI Elements:   Location:   generalized. Quality:  acute. Severity:  severe. Timing:  constant. Duration:  couple of days. Context:  stressors.  Past Medical History:  Past Medical History  Diagnosis Date  . Anxiety   . GERD (gastroesophageal reflux disease)   . Bipolar 1 disorder   . Diabetes mellitus 2010  . Hypertension 2010  . Depression   . PTSD (post-traumatic stress disorder)   . Anemia   . Obesity   . Tobacco use   . PONV (postoperative nausea and vomiting)   . Gastritis   . Pneumonia   . Coronary artery disease   . COPD (chronic obstructive pulmonary disease)   . Schizophrenia   . Headache     migraines  . Alopecia     Past Surgical History  Procedure Laterality Date  . Abdominal hysterectomy    . Leg surgery    . Cholecystectomy    . Tubal ligation    . Cardiac catheterization    . Colonoscopy    . Fracture surgery      left leg  . Colonoscopy with propofol N/A 05/14/2014    Procedure: COLONOSCOPY WITH PROPOFOL;  Surgeon: Wilford Corner, MD;  Location: [facility] ENDOSCOPY;  Service: Endoscopy;  Laterality: N/A;  . Esophagogastroduodenoscopy (egd) with propofol N/A 05/14/2014    Procedure: ESOPHAGOGASTRODUODENOSCOPY (EGD) WITH PROPOFOL;  Surgeon: Wilford Corner, MD;  Location: [facility] ENDOSCOPY;  Service: Endoscopy;  Laterality: N/A;   Family History:  Family History  Problem Relation Age of Onset  . Diabetes Mother   . Kidney failure Mother   . Diabetes Other   . Cancer Other   . Hypertension Other    Social History:  History  Alcohol Use No     History  Drug Use No    Social History  Social History  . Marital Status: Divorced    Spouse Name: N/A  . Number of Children: N/A  . Years of Education: N/A   Social History Main Topics  . Smoking status: Current Some Day Smoker  . Smokeless tobacco: Never Used  . Alcohol Use: No  . Drug Use: No  . Sexual Activity: Not Asked   Other Topics Concern  . None   Social History Narrative   Additional Social History:    Pain  Medications: denies Prescriptions: denies Over the Counter: denies History of alcohol / drug use?: No history of alcohol / drug abuse Longest period of sobriety (when/how long): denies Negative Consequences of Use:  (denies) Withdrawal Symptoms:  (denies)                     Allergies:   Allergies  Allergen Reactions  . Dexilant [Dexlansoprazole] Other (See Comments)    Chest pain, sob, diarrhea, abd pain, vomiting  . Famotidine Anaphylaxis  . Meperidine Hcl Anaphylaxis  . Gabapentin Other (See Comments)    Memory loss  . Ramipril     angioedema  . Invokana [Canagliflozin] Rash  . Iohexol Itching and Rash  . Tape Rash    Use paper tape only     Labs:  Results for orders placed or performed during the hospital encounter of 11/21/14 (from the past 48 hour(s))  Comprehensive metabolic panel     Status: Abnormal   Collection Time: 11/21/14  1:05 AM  Result Value Ref Range   Sodium 136 135 - 145 mmol/L   Potassium 3.4 (L) 3.5 - 5.1 mmol/L   Chloride 102 101 - 111 mmol/L   CO2 25 22 - 32 mmol/L   Glucose, Bld 123 (H) 65 - 99 mg/dL   BUN 11 6 - 20 mg/dL   Creatinine, Ser 1.03 (H) 0.44 - 1.00 mg/dL   Calcium 8.8 (L) 8.9 - 10.3 mg/dL   Total Protein 8.3 (H) 6.5 - 8.1 g/dL   Albumin 4.1 3.5 - 5.0 g/dL   AST 19 15 - 41 U/L   ALT 14 14 - 54 U/L   Alkaline Phosphatase 88 38 - 126 U/L   Total Bilirubin <0.1 (L) 0.3 - 1.2 mg/dL   GFR calc non Af Amer >60 >60 mL/min   GFR calc Af Amer >60 >60 mL/min    Comment: (NOTE) The eGFR has been calculated using the CKD EPI equation. This calculation has not been validated in all clinical situations. eGFR's persistently <60 mL/min signify possible Chronic Kidney Disease.    Anion gap 9 5 - 15  CBC     Status: Abnormal   Collection Time: 11/21/14  1:05 AM  Result Value Ref Range   WBC 13.1 (H) 4.0 - 10.5 K/uL   RBC 4.45 3.87 - 5.11 MIL/uL   Hemoglobin 12.0 12.0 - 15.0 g/dL   HCT 36.2 36.0 - 46.0 %   MCV 81.3 78.0 - 100.0 fL    MCH 27.0 26.0 - 34.0 pg   MCHC 33.1 30.0 - 36.0 g/dL   RDW 15.5 11.5 - 15.5 %   Platelets 444 (H) 150 - 400 K/uL  Ethanol (ETOH)     Status: None   Collection Time: 11/21/14  1:05 AM  Result Value Ref Range   Alcohol, Ethyl (B) <5 <5 mg/dL    Comment:        LOWEST DETECTABLE LIMIT FOR SERUM ALCOHOL IS 5 mg/dL FOR MEDICAL PURPOSES ONLY   CBG  monitoring, ED     Status: Abnormal   Collection Time: 11/21/14  1:12 AM  Result Value Ref Range   Glucose-Capillary 121 (H) 65 - 99 mg/dL  Urine rapid drug screen (hosp performed) (Not at [facility])     Status: Abnormal   Collection Time: 11/21/14  2:30 AM  Result Value Ref Range   Opiates NONE DETECTED NONE DETECTED   Cocaine NONE DETECTED NONE DETECTED   Benzodiazepines POSITIVE (A) NONE DETECTED   Amphetamines NONE DETECTED NONE DETECTED   Tetrahydrocannabinol NONE DETECTED NONE DETECTED   Barbiturates NONE DETECTED NONE DETECTED    Comment:        DRUG SCREEN FOR MEDICAL PURPOSES ONLY.  IF CONFIRMATION IS NEEDED FOR ANY PURPOSE, NOTIFY LAB WITHIN 5 DAYS.        LOWEST DETECTABLE LIMITS FOR URINE DRUG SCREEN Drug Class       Cutoff (ng/mL) Amphetamine      1000 Barbiturate      200 Benzodiazepine   517 Tricyclics       616 Opiates          300 Cocaine          300 THC              50   CBG monitoring, ED     Status: Abnormal   Collection Time: 11/21/14  7:41 AM  Result Value Ref Range   Glucose-Capillary 121 (H) 65 - 99 mg/dL  CBG monitoring, ED     Status: Abnormal   Collection Time: 11/21/14  6:01 PM  Result Value Ref Range   Glucose-Capillary 118 (H) 65 - 99 mg/dL  CBG monitoring, ED     Status: Abnormal   Collection Time: 11/21/14  8:56 PM  Result Value Ref Range   Glucose-Capillary 117 (H) 65 - 99 mg/dL  CBG monitoring, ED     Status: Abnormal   Collection Time: 11/22/14  8:17 AM  Result Value Ref Range   Glucose-Capillary 136 (H) 65 - 99 mg/dL   Comment 1 Notify RN     Vitals: Blood pressure 117/67, pulse 84,  temperature 98.4 F (36.9 C), temperature source Oral, resp. rate 21, SpO2 100 %.  Risk to Self: Suicidal Ideation: Yes-Currently Present Suicidal Intent: No Is patient at risk for suicide?: Yes Suicidal Plan?: No Access to Means: No What has been your use of drugs/alcohol within the last 12 months?: denies Intentional Self Injurious Behavior: None Risk to Others: Homicidal Ideation: No Thoughts of Harm to Others: No Current Homicidal Intent: No Current Homicidal Plan: No Access to Homicidal Means: No History of harm to others?: No Assessment of Violence: None Noted Does patient have access to weapons?: No Criminal Charges Pending?: No Does patient have a court date: No Prior Inpatient Therapy: Prior Inpatient Therapy: Yes Prior Therapy Dates:  [facility], others) Prior Therapy Facilty/Makyna Niehoff(s):  [facility]) Reason for Treatment: depression, psychosis Prior Outpatient Therapy: Prior Outpatient Therapy: Yes Prior Therapy Dates: years Prior Therapy Facilty/Jovon Winterhalter(s): [facility] Reason for Treatment: psychosis Does patient have an ACCT team?: Unknown (meeting with one this week) Does patient have Intensive In-House Services?  : No Does patient have [facility] services? : Yes Does patient have [facility] services?: No  Current Facility-Administered Medications  Medication Dose Route Frequency Aubree Doody Last Rate Last Dose  . amLODipine (NORVASC) tablet 10 mg  10 mg Oral Daily Antonietta Breach, PA-C   10 mg at 11/21/14 1002  . atenolol (TENORMIN) tablet 50 mg  50 mg Oral BID Claiborne Billings  Humes, PA-C   50 mg at 11/21/14 2131  . clonazePAM (KLONOPIN) tablet 0.5 mg  0.5 mg Oral TID PRN Antonietta Breach, PA-C      . furosemide (LASIX) tablet 20 mg  20 mg Oral Daily Antonietta Breach, PA-C   20 mg at 11/21/14 1002  . insulin glargine (LANTUS) injection 20 Units  20 Units Subcutaneous Q2200 Antonietta Breach, PA-C   20 Units at 11/21/14 2132  . irbesartan (AVAPRO) tablet 300 mg  300 mg Oral Daily Antonietta Breach, PA-C   300 mg at 11/21/14  1002  . linagliptin (TRADJENTA) tablet 5 mg  5 mg Oral Q breakfast Carmin Muskrat, MD   5 mg at 11/22/14 0846  . metFORMIN (GLUCOPHAGE) tablet 1,000 mg  1,000 mg Oral BID WC Carmin Muskrat, MD   1,000 mg at 11/22/14 0846  . mometasone-formoterol (DULERA) 100-5 MCG/ACT inhaler 2 puff  2 puff Inhalation BID Antonietta Breach, PA-C   2 puff at 11/22/14 0845  . paliperidone (INVEGA) 24 hr tablet 6 mg  6 mg Oral QHS Patrecia Pour, NP   6 mg at 11/21/14 2131  . pantoprazole (PROTONIX) EC tablet 40 mg  40 mg Oral BID Antonietta Breach, PA-C   40 mg at 11/21/14 2131  . rosuvastatin (CRESTOR) tablet 10 mg  10 mg Oral Daily Antonietta Breach, PA-C   10 mg at 11/21/14 1817  . sertraline (ZOLOFT) tablet 50 mg  50 mg Oral Daily Patrecia Pour, NP   50 mg at 11/21/14 1359  . sucralfate (CARAFATE) tablet 1 g  1 g Oral TID WC & HS Antonietta Breach, PA-C   1 g at 11/22/14 0846  . traZODone (DESYREL) tablet 100 mg  100 mg Oral QHS Antonietta Breach, PA-C   100 mg at 11/21/14 2130   Current Outpatient Prescriptions  Medication Sig Dispense Refill  . albuterol (PROVENTIL HFA;VENTOLIN HFA) 108 (90 BASE) MCG/ACT inhaler Inhale 2 puffs into the lungs every 6 (six) hours as needed for wheezing. For wheezing 18 g 0  . amLODipine (NORVASC) 10 MG tablet Take 1 tablet (10 mg total) by mouth daily. 90 tablet 0  . atenolol (TENORMIN) 50 MG tablet Take 1 tablet (50 mg total) by mouth 2 (two) times daily. 180 tablet 0  . BENICAR 40 MG tablet take 1 tablet by mouth once daily 30 tablet 2  . clonazePAM (KLONOPIN) 0.5 MG tablet Take 0.5 mg by mouth 3 (three) times daily as needed for anxiety.     . diphenhydrAMINE (BENADRYL) 25 mg capsule Take 50 mg by mouth at bedtime.    Marland Kitchen EPINEPHrine (EPIPEN 2-PAK) 0.3 mg/0.3 mL IJ SOAJ injection Inject 0.3 mLs (0.3 mg total) into the muscle once. 1 Device 2  . Exenatide ER 2 MG PEN Inject 2 mg into the skin once a week. 4 each 3  . Fluticasone-Salmeterol (ADVAIR DISKUS) 250-50 MCG/DOSE AEPB Inhale 1 puff into the  lungs 2 (two) times daily. 60 each 5  . furosemide (LASIX) 20 MG tablet Take 1 tablet (20 mg total) by mouth daily. 90 tablet 0  . Insulin Glargine (LANTUS SOLOSTAR) 100 UNIT/ML Solostar Pen Inject 20 Units into the skin daily at 10 pm. 5 pen 3  . nitroGLYCERIN (NITROSTAT) 0.4 MG SL tablet Place 0.4 mg under the tongue every 5 (five) minutes as needed for chest pain.    Marland Kitchen olmesartan (BENICAR) 40 MG tablet Take 1 tablet (40 mg total) by mouth daily. 90 tablet 0  . paliperidone (INVEGA) 6 MG  24 hr tablet Take 6 mg by mouth at bedtime.    . pantoprazole (PROTONIX) 40 MG tablet Take 1 tablet (40 mg total) by mouth 2 (two) times daily. 40 tablet 3  . polyethylene glycol (MIRALAX / GLYCOLAX) packet Take 17 g by mouth daily. 14 each 0  . rosuvastatin (CRESTOR) 10 MG tablet Take 1 tablet (10 mg total) by mouth daily. 90 tablet 0  . sertraline (ZOLOFT) 50 MG tablet Take 50 mg by mouth daily.    . sitaGLIPtin-metformin (JANUMET) 50-1000 MG per tablet Take 1 tablet by mouth 2 (two) times daily with a meal. 60 tablet 1  . sucralfate (CARAFATE) 1 G tablet Take 1 g by mouth 4 (four) times daily -  with meals and at bedtime.    . traZODone (DESYREL) 100 MG tablet Take 100 mg by mouth at bedtime.    Marland Kitchen amLODipine (NORVASC) 10 MG tablet take 1 tablet by mouth once daily (Patient not taking: Reported on 11/21/2014) 30 tablet 0  . atenolol (TENORMIN) 50 MG tablet take 1 tablet by mouth twice a day (Patient not taking: Reported on 11/21/2014) 60 tablet 2  . furosemide (LASIX) 20 MG tablet take 1 tablet by mouth once daily (Patient not taking: Reported on 11/21/2014) 30 tablet 2    Musculoskeletal: Strength & Muscle Tone: within normal limits Gait & Station: normal Patient leans: N/A  Psychiatric Specialty Exam: Physical Exam  Review of Systems  Constitutional: Negative.   HENT: Negative.   Eyes: Negative.   Respiratory: Negative.   Cardiovascular: Negative.   Gastrointestinal: Negative.   Genitourinary:  Negative.   Musculoskeletal: Negative.   Skin: Negative.   Neurological: Negative.   Endo/Heme/Allergies: Negative.   Psychiatric/Behavioral:       Negative    Blood pressure 117/67, pulse 84, temperature 98.4 F (36.9 C), temperature source Oral, resp. rate 21, SpO2 100 %.There is no weight on file to calculate BMI.  General Appearance: Casual  Eye Contact::  Good  Speech:  Normal   Volume:  Normal  Mood:  Euthymic  Affect:  Congruent  Thought Process:  Coherent  Orientation:  Full (Time, Place, and Person)  Thought Content:  WDL  Suicidal Thoughts:  No  Homicidal Thoughts:  No  Memory:Immediate-Good, REmote-Good, recent-good  Judgement:  Good  Insight:  Good  Psychomotor Activity:  Normal  Concentration:  Good  Recall: Good  Fund of Knowledge:  Good  Language:  Good  Akathisia:  No  Handed:  Right  AIMS (if indicated):     Assets:  Housing Leisure Time Physical Health Resilience Social Support  ADL's:  Intact  Cognition: WDL  Sleep:      Medical Decision Making: Review of Psycho-Social Stressors (1), Review or order clinical lab tests (1) and Review of Medication Regimen & Side Effects (2)  Treatment Plan Summary: Daily contact with patient to assess and evaluate symptoms and progress in treatment, Medication management and Plan :  Schizoaffective disorder, acute exacerbation, severe:  -Crisis stabilization -Individual counseling -Medication management:  Start Invega 6 mg daily for psychosis and Zoloft 50 mg daily for depression  Plan:  No safety concerns Disposition: discharge home with follow-up at [facility], her regular providers  Waylan Boga, [place] 11/22/2014 10:00 AM Patient seen face-to-face for psychiatric evaluation, chart reviewed and case discussed with the physician extender and developed treatment plan. Reviewed the information documented and agree with the treatment plan. Corena Pilgrim, MD

## 2014-11-24 ENCOUNTER — Other Ambulatory Visit: Payer: Self-pay | Admitting: Pharmacist

## 2014-11-24 ENCOUNTER — Telehealth: Payer: Self-pay | Admitting: Medical

## 2014-11-24 NOTE — Patient Outreach (Signed)
Cheyenne Alvarez is a 49 y.o. female referred to pharmacy for medication management related to medication adherence. Call for initial outreach call to patient.   [name] reports that she is doing [place]. Reports that she was in the hospital over the weekend. Patient reports that she has no medication questions for me. Patient declines my offers to review her medications with her or to meet with her in her home to discuss her medication. Offer to provide patient with my contact information, but she declines.  Will close pharmacy episode for now.   Duanne Moron, PharmD Clinical Pharmacist [facility] Care Management [phone]

## 2014-11-24 NOTE — Telephone Encounter (Signed)
Pt called and asked about the status of her home health evaluation. I called [facility] and they are scheduled to go out this Wednesday for both the assessment and a social worker will also be coming. Pt was called back and informed of the information.

## 2014-11-26 ENCOUNTER — Encounter: Payer: Self-pay | Admitting: *Deleted

## 2014-11-26 ENCOUNTER — Other Ambulatory Visit: Payer: Self-pay | Admitting: Licensed Clinical Social Worker

## 2014-11-26 ENCOUNTER — Other Ambulatory Visit: Payer: Self-pay | Admitting: *Deleted

## 2014-11-26 NOTE — Patient Outreach (Signed)
[facility]) Care Management  [facility] Social Work  11/26/2014  Cheyenne Alvarez 25-Jan-1966 [id number]    Current Medications:  Current Outpatient Prescriptions  Medication Sig Dispense Refill  . albuterol (PROVENTIL HFA;VENTOLIN HFA) 108 (90 BASE) MCG/ACT inhaler Inhale 2 puffs into the lungs every 6 (six) hours as needed for wheezing. For wheezing 18 g 0  . amLODipine (NORVASC) 10 MG tablet Take 1 tablet (10 mg total) by mouth daily. 90 tablet 0  . amLODipine (NORVASC) 10 MG tablet take 1 tablet by mouth once daily (Patient not taking: Reported on 11/21/2014) 30 tablet 0  . atenolol (TENORMIN) 50 MG tablet Take 1 tablet (50 mg total) by mouth 2 (two) times daily. 180 tablet 0  . atenolol (TENORMIN) 50 MG tablet take 1 tablet by mouth twice a day (Patient not taking: Reported on 11/21/2014) 60 tablet 2  . BENICAR 40 MG tablet take 1 tablet by mouth once daily 30 tablet 2  . clonazePAM (KLONOPIN) 0.5 MG tablet Take 0.5 mg by mouth 3 (three) times daily as needed for anxiety.     . diphenhydrAMINE (BENADRYL) 25 mg capsule Take 50 mg by mouth at bedtime.    Marland Kitchen EPINEPHrine (EPIPEN 2-PAK) 0.3 mg/0.3 mL IJ SOAJ injection Inject 0.3 mLs (0.3 mg total) into the muscle once. 1 Device 2  . Exenatide ER 2 MG PEN Inject 2 mg into the skin once a week. 4 each 3  . Fluticasone-Salmeterol (ADVAIR DISKUS) 250-50 MCG/DOSE AEPB Inhale 1 puff into the lungs 2 (two) times daily. 60 each 5  . furosemide (LASIX) 20 MG tablet Take 1 tablet (20 mg total) by mouth daily. 90 tablet 0  . furosemide (LASIX) 20 MG tablet take 1 tablet by mouth once daily (Patient not taking: Reported on 11/21/2014) 30 tablet 2  . Insulin Glargine (LANTUS SOLOSTAR) 100 UNIT/ML Solostar Pen Inject 20 Units into the skin daily at 10 pm. 5 pen 3  . nitroGLYCERIN (NITROSTAT) 0.4 MG SL tablet Place 0.4 mg under the tongue every 5 (five) minutes as needed for chest pain.    Marland Kitchen olmesartan (BENICAR) 40 MG tablet Take 1 tablet (40  mg total) by mouth daily. (Patient not taking: Reported on 11/26/2014) 90 tablet 0  . paliperidone (INVEGA) 6 MG 24 hr tablet Take 6 mg by mouth at bedtime.    . pantoprazole (PROTONIX) 40 MG tablet Take 1 tablet (40 mg total) by mouth 2 (two) times daily. 40 tablet 3  . polyethylene glycol (MIRALAX / GLYCOLAX) packet Take 17 g by mouth daily. (Patient not taking: Reported on 11/26/2014) 14 each 0  . rosuvastatin (CRESTOR) 10 MG tablet Take 1 tablet (10 mg total) by mouth daily. 90 tablet 0  . sertraline (ZOLOFT) 50 MG tablet Take 50 mg by mouth daily.    . sitaGLIPtin-metformin (JANUMET) 50-1000 MG per tablet Take 1 tablet by mouth 2 (two) times daily with a meal. 60 tablet 1  . sucralfate (CARAFATE) 1 G tablet Take 1 g by mouth 4 (four) times daily -  with meals and at bedtime.    . traZODone (DESYREL) 100 MG tablet Take 100 mg by mouth at bedtime.     No current facility-administered medications for this visit.    Functional Status:  In your present state of health, do you have any difficulty performing the following activities: 11/21/2014 11/12/2014  Hearing? N -  Vision? N -  Difficulty concentrating or making decisions? Malvin Johns  Walking or climbing stairs? N  N  Dressing or bathing? N Y  Doing errands, shopping? - Y  Preparing Food and eating ? - Y  Using the Toilet? - N  In the past six months, have you accidently leaked urine? - N  Do you have problems with loss of bowel control? - N  Managing your Medications? - Y  Managing your Finances? - Y  Housekeeping or managing your Housekeeping? - Y    Fall/Depression Screening:  PHQ 2/9 Scores 11/26/2014 11/12/2014  PHQ - 2 Score 2 2  PHQ- 9 Score - 9    Assessment: CSW completed home visit to complete assessment with RNCM Cheyenne Alvarez on 11/26/14. Patient, patient's sister and neighbor were at the residence when [facility] staff arrived. Neighbor introduced self and then left. Patient agreeable to completing assessment with sister in the room.  Patient was very paranoid throughout the visit because she felt she was going to have to get "committed to mental hospital." Patient continued to say "please don't let them take me." [facility] staff continued to comfort patient and informed patient that they were there to help and assist with patient's needs. Patient's sister states that she has been staying this week with patient but that she cannot provide daily care to patient as she is also disabled and that patient's daughter Cheyenne Alvarez will be coming to live with her on 12/03/14. Patient's sister shares that she is hopeful for patient to be getting aid soon as she has trouble with ALD's. Patient needs assistance with bathing, cooking and using the bathroom. Patient's sister stated that she has a poor appetite and "barely eats but ate good today." CSW questioned if patient's previous aid was paid by through [facility] and patient's sister was unsure but stated that the agency was called [facility] and that her aid's name was Cheyenne Alvarez who took excellent care of patient. Patient reported that she wishes to have Cheyenne Alvarez back as her aid. Patient and patient's sister had conflicting statements on why home aid services ended. CSW contacted [facility] with patient's permission. [facility] stated that a PCS assessment was completed on March 23rd of this year and that she was denied. [facility] stated that they did not pay for any home aid services. [facility] also stated that patient's PCP had completed PCS application but that certain parts were not completed and that they faxed it back for more information on September 23rd but have not received fax back from PCP. CSW then called patient's PCP office and informed staff of updated information. Dr. Adelene Idler nurse was currently busy and PCP was out on vacation but it was informed to CSW that PCS would complete needed information and fax back to [facility]. CSW passed along information to  patient and patient's sister. CSW and RNCM completed assessment during home visit. Patient reported feelings of hopelessness and ongoing depression. Patient reported that she receives services from [facility] but that she does not agree with new medication that they prescribed her but is understanding that medications take some time to treat symptoms. Patient reports that she receives counseling services at [facility] but is not trusting of their medication recommendations. Patient expressed seeing things and hearing voices. Cheyenne Alvarez shares that she has "racing thoughts." Patient denies SI/HI ideations at this time. Patient reported anxiety over health concerns and reports eagerness to take better care of health. Patient's sister reports that she smokes 2 packs of cigarettes a day. At first, Cheyenne Alvarez stated she wanted to quit and  then retracted that statement once smoking cessation resources were suggested. Patient did not wish to receive education on smoking cessation. Patient's sister states that patient's daughter Cheyenne Alvarez manages her medications even though she is not currently there. Patient continues to have ongoing fears of involuntary commitment throughout the session and [facility] staff and sister helped to calm her down. Patient stayed silent with poor eye contact for over 5 minutes and then was willing to to continue answering questions. Patient's sister stated that this happens often. CSW and RNCM agree that patient is in need of home care services at this time. CSW and RNCM will complete next home visit once Cheyenne Alvarez has arrived to patient's residence to stay.  Plan-CSW will remain available to patient and aid in patient gaining PCS services.   Dickie La, BSW, MSW, LCSW [email or web address] Phone: [phone] Fax: [phone]    Plan:

## 2014-11-26 NOTE — Patient Outreach (Signed)
[facility]) Care Management   11/26/2014  Alexzia Kasler 1965-11-27 [id number]  Sherlynn Idaly Verret is an 49 y.o. female  Subjective:   Objective:   Review of Systems  Constitutional: Negative.   HENT: Negative.   Eyes: Negative.   Respiratory: Negative.   Gastrointestinal: Negative.   Genitourinary: Negative.   Musculoskeletal: Negative.   Skin: Negative.   Neurological: Negative.   Endo/Heme/Allergies: Negative.   Psychiatric/Behavioral: Positive for depression and hallucinations. The patient is nervous/anxious and has insomnia.     Physical Exam  Constitutional: She appears well-developed and well-nourished.  Neck: Normal range of motion.  Cardiovascular: Normal rate, regular rhythm and normal heart sounds.   Respiratory: Effort normal and breath sounds normal.  GI: Soft. Bowel sounds are normal.  Musculoskeletal: Normal range of motion.  Neurological: She is alert.  Skin: Skin is warm and dry.    Current Medications:   Current Outpatient Prescriptions  Medication Sig Dispense Refill  . albuterol (PROVENTIL HFA;VENTOLIN HFA) 108 (90 BASE) MCG/ACT inhaler Inhale 2 puffs into the lungs every 6 (six) hours as needed for wheezing. For wheezing 18 g 0  . amLODipine (NORVASC) 10 MG tablet Take 1 tablet (10 mg total) by mouth daily. 90 tablet 0  . atenolol (TENORMIN) 50 MG tablet Take 1 tablet (50 mg total) by mouth 2 (two) times daily. 180 tablet 0  . BENICAR 40 MG tablet take 1 tablet by mouth once daily 30 tablet 2  . diphenhydrAMINE (BENADRYL) 25 mg capsule Take 50 mg by mouth at bedtime.    Marland Kitchen EPINEPHrine (EPIPEN 2-PAK) 0.3 mg/0.3 mL IJ SOAJ injection Inject 0.3 mLs (0.3 mg total) into the muscle once. 1 Device 2  . Exenatide ER 2 MG PEN Inject 2 mg into the skin once a week. 4 each 3  . Fluticasone-Salmeterol (ADVAIR DISKUS) 250-50 MCG/DOSE AEPB Inhale 1 puff into the lungs 2 (two) times daily. 60 each 5  . furosemide (LASIX) 20 MG tablet Take  1 tablet (20 mg total) by mouth daily. 90 tablet 0  . Insulin Glargine (LANTUS SOLOSTAR) 100 UNIT/ML Solostar Pen Inject 20 Units into the skin daily at 10 pm. 5 pen 3  . nitroGLYCERIN (NITROSTAT) 0.4 MG SL tablet Place 0.4 mg under the tongue every 5 (five) minutes as needed for chest pain.    . pantoprazole (PROTONIX) 40 MG tablet Take 1 tablet (40 mg total) by mouth 2 (two) times daily. 40 tablet 3  . rosuvastatin (CRESTOR) 10 MG tablet Take 1 tablet (10 mg total) by mouth daily. 90 tablet 0  . sertraline (ZOLOFT) 50 MG tablet Take 50 mg by mouth daily.    . sitaGLIPtin-metformin (JANUMET) 50-1000 MG per tablet Take 1 tablet by mouth 2 (two) times daily with a meal. 60 tablet 1  . sucralfate (CARAFATE) 1 G tablet Take 1 g by mouth 4 (four) times daily -  with meals and at bedtime.    . traZODone (DESYREL) 100 MG tablet Take 100 mg by mouth at bedtime.    Marland Kitchen amLODipine (NORVASC) 10 MG tablet take 1 tablet by mouth once daily (Patient not taking: Reported on 11/21/2014) 30 tablet 0  . atenolol (TENORMIN) 50 MG tablet take 1 tablet by mouth twice a day (Patient not taking: Reported on 11/21/2014) 60 tablet 2  . clonazePAM (KLONOPIN) 0.5 MG tablet Take 0.5 mg by mouth 3 (three) times daily as needed for anxiety.     . furosemide (LASIX) 20 MG tablet take 1  tablet by mouth once daily (Patient not taking: Reported on 11/21/2014) 30 tablet 2  . olmesartan (BENICAR) 40 MG tablet Take 1 tablet (40 mg total) by mouth daily. (Patient not taking: Reported on 11/26/2014) 90 tablet 0  . paliperidone (INVEGA) 6 MG 24 hr tablet Take 6 mg by mouth at bedtime.    . polyethylene glycol (MIRALAX / GLYCOLAX) packet Take 17 g by mouth daily. (Patient not taking: Reported on 11/26/2014) 14 each 0   No current facility-administered medications for this visit.    Functional Status:   In your present state of health, do you have any difficulty performing the following activities: 11/26/2014 11/21/2014  Hearing? N N  Vision?  N N  Difficulty concentrating or making decisions? Malvin Johns  Walking or climbing stairs? N N  Dressing or bathing? Y N  Doing errands, shopping? Y -  Quarry manager and eating ? Y -  Using the Toilet? N -  In the past six months, have you accidently leaked urine? Y -  Do you have problems with loss of bowel control? N -  Managing your Medications? Y -  Managing your Finances? Y -  Housekeeping or managing your Housekeeping? Y -    Fall/Depression Screening:    PHQ 2/9 Scores 11/26/2014 11/12/2014  PHQ - 2 Score 2 2  PHQ- 9 Score - 9    Assessment:    Initial home visit, accompanied by Child psychotherapist, B. Alona Bene.  Member's sister, Wendi Maya, present during visit and provides most information.  She states that she has been with the member since her discharge from [facility].  She reports that it has been difficult to get the member to eat or participate in ADLs.  Member is slow to speak, but is alert, stating repeatedly that the feels that her family is trying to "lock me up."  She repeats over and over "don't let them put me away."  Member assured several times that this care manager and social worker were actively working to have the member receive resources in the home.  Sister reports that the member is in need of assistance in the home, stating that the member's daughter will be moving to [place] on Oct. 5, however, she will be working during the day.  Social worker currently working on personal care aide assistance   Member has history of diabetes, congestive heart failure, hypertension, and bipolar.  Diabetes teaching attempted with sister, she state that she would not be the best person for the teaching and that the member's daughter would be the best.  Sister made aware that this care manager will contact the daughter once she is in town to reschedule another home visit and provide more education and disease management.  The sister denies any further urgent needs (other than the personal  care services), encouraged to contact this care manager or the social worker with any questions or concerns.  Plan:   Will contact daughter to schedule another home visit after she has arrived in [place].  [facility] CM Care Plan Problem One        Most Recent Value   Care Plan Problem One  Medication Management   Role Documenting the Problem One  Care Management Coordinator   Care Plan for Problem One  Active   [facility] CM Short Term Goal #1 (0-30 days)  Member/family will report that the member is taking all medications without any problems    [facility] CM Short Term Goal #1 Start Date  11/26/14  Interventions for Short Term Goal #1  Discussed medication management with sister, will discuss with daughter once she arrives in [facility], BSN, [facility] Care Management  [facility] Care Manager [phone]

## 2014-11-28 ENCOUNTER — Encounter: Payer: Self-pay | Admitting: Licensed Clinical Social Worker

## 2014-11-28 ENCOUNTER — Encounter: Payer: Self-pay | Admitting: *Deleted

## 2014-11-28 NOTE — Patient Outreach (Signed)
[facility]) Care Management  11/28/2014  Cheyenne Alvarez May 09, 1965 [id number]   Assessment-CSW contacted PCP's office to gather updated information on PCS application. Front desk stated that they had completed needed information on PCS form and attempted to re fax it but it did not go through. CSW provided 2 different numbers for [facility] for them to complete a second fax attempt at both numbers.  Plan-CSW will continue to be available to patient and contact [facility] for application status updates next week.  Dickie La, BSW, MSW, LCSW [email or web address] Phone: [phone] Fax: [phone]

## 2014-12-02 ENCOUNTER — Encounter: Payer: Self-pay | Admitting: Licensed Clinical Social Worker

## 2014-12-02 NOTE — Patient Outreach (Signed)
[facility]) Care Management  12/02/2014  Cheyenne Alvarez 02-05-1966 [id number]   Assessment-CSW contacted [facility] and it was confirmed that patient's PCS application is now being processed.  Plan-CSW will check on status update on 12/05/14.  Cheyenne Alvarez, BSW, MSW, LCSW [email or web address] Phone: [phone] Fax: [phone]

## 2014-12-04 ENCOUNTER — Telehealth: Payer: Self-pay | Admitting: Medical

## 2014-12-04 NOTE — Telephone Encounter (Signed)
Recv'd fax from [facility] states there are lower alternatives to pt's prescriptions.  Appt to discuss per [facility]. Called pt and she states she has met her deductable and all her meds are free right now so she would like to keep them the same Faxed declined form back

## 2014-12-10 ENCOUNTER — Encounter: Payer: Self-pay | Admitting: Licensed Clinical Social Worker

## 2014-12-10 NOTE — Patient Outreach (Signed)
[facility]) Care Management  12/10/2014  Cheyenne QuanWilma [name] [birth year] [id number]   CSW contacted [facility] to inquire about PCS and it was confirmed that patient's assessment for eligibility was completed yesterday on 12/09/14. CSW will check on status of PCS next week.  Dickie La.Shandreka Dante, BSW, MSW, LCSW [facility] [email or web address] Phone: [phone] Fax: [phone]

## 2014-12-15 ENCOUNTER — Encounter: Payer: Self-pay | Admitting: Licensed Clinical Social Worker

## 2014-12-18 ENCOUNTER — Other Ambulatory Visit: Payer: Self-pay | Admitting: Licensed Clinical Social Worker

## 2014-12-18 ENCOUNTER — Telehealth: Payer: Self-pay

## 2014-12-18 NOTE — Telephone Encounter (Signed)
LM to CB and schedule a f/u

## 2014-12-18 NOTE — Patient Outreach (Signed)
[facility]) Care Management  12/18/2014  Cheyenne Alvarez 07/01/1965 [id number]   Assessment-CSW completed outreach to patient on 12/18/14. Patient provided HIPPA verifications. [facility] and that [facility] started this past Tuesday. Patient reports they provide services for three hours and then her neighbor or daughter Cheyenne Alvarez will come over to help assist her with her needs. Patient shares that Cheyenne Alvarez did not end up living with her after moving from [place] but she is closer now and [place] in [place], [place]. Patient reports being in some pain today and that she has a doctor's appointment tomorrow that her neighbor will be taking her to. [name] services. Patient [facility]. She communicates that her aid takes "very good care of me and listens." Patient agreeable to completing psychosocial assessment again and reviewing medications. CSW informed [facility]W will be completing next home visit soon. Patient agreeable to joint home visit. Goal has   Plan-CSW will update [name]. CSW will remain available to patient for any further needs.  Dickie La.Pam Vanalstine, BSW, MSW, LCSW [facility] [name] Phone: [phone] Fax: [phone]

## 2014-12-19 ENCOUNTER — Ambulatory Visit (INDEPENDENT_AMBULATORY_CARE_PROVIDER_SITE_OTHER): Payer: Medicare Other | Admitting: Medical

## 2014-12-19 ENCOUNTER — Encounter: Payer: Self-pay | Admitting: Medical

## 2014-12-19 VITALS — BP 124/82 | HR 93 | Temp 98.3°F | Wt 219.0 lb

## 2014-12-19 DIAGNOSIS — F250 Schizoaffective disorder, bipolar type: Secondary | ICD-10-CM | POA: Diagnosis not present

## 2014-12-19 DIAGNOSIS — E8941 Symptomatic postprocedural ovarian failure: Secondary | ICD-10-CM | POA: Diagnosis not present

## 2014-12-19 DIAGNOSIS — K219 Gastro-esophageal reflux disease without esophagitis: Secondary | ICD-10-CM | POA: Diagnosis not present

## 2014-12-19 DIAGNOSIS — I10 Essential (primary) hypertension: Secondary | ICD-10-CM | POA: Diagnosis not present

## 2014-12-19 DIAGNOSIS — F17200 Nicotine dependence, unspecified, uncomplicated: Secondary | ICD-10-CM | POA: Diagnosis not present

## 2014-12-19 DIAGNOSIS — Z794 Long term (current) use of insulin: Secondary | ICD-10-CM | POA: Diagnosis not present

## 2014-12-19 DIAGNOSIS — IMO0002 Reserved for concepts with insufficient information to code with codable children: Secondary | ICD-10-CM

## 2014-12-19 DIAGNOSIS — E1140 Type 2 diabetes mellitus with diabetic neuropathy, unspecified: Secondary | ICD-10-CM

## 2014-12-19 DIAGNOSIS — G4700 Insomnia, unspecified: Secondary | ICD-10-CM | POA: Diagnosis not present

## 2014-12-19 DIAGNOSIS — R252 Cramp and spasm: Secondary | ICD-10-CM

## 2014-12-19 DIAGNOSIS — E1165 Type 2 diabetes mellitus with hyperglycemia: Secondary | ICD-10-CM | POA: Diagnosis not present

## 2014-12-19 LAB — CBC WITH DIFFERENTIAL/PLATELET
BASOS ABS: 0 10*3/uL (ref 0.0–0.1)
Basophils Relative: 0 % (ref 0–1)
EOS PCT: 3 % (ref 0–5)
Eosinophils Absolute: 0.3 10*3/uL (ref 0.0–0.7)
HEMATOCRIT: 35.2 % — AB (ref 36.0–46.0)
HEMOGLOBIN: 11.6 g/dL — AB (ref 12.0–15.0)
LYMPHS ABS: 3.1 10*3/uL (ref 0.7–4.0)
LYMPHS PCT: 32 % (ref 12–46)
MCH: 26.5 pg (ref 26.0–34.0)
MCHC: 33 g/dL (ref 30.0–36.0)
MCV: 80.5 fL (ref 78.0–100.0)
MPV: 9.5 fL (ref 8.6–12.4)
Monocytes Absolute: 0.5 10*3/uL (ref 0.1–1.0)
Monocytes Relative: 5 % (ref 3–12)
NEUTROS ABS: 5.8 10*3/uL (ref 1.7–7.7)
Neutrophils Relative %: 60 % (ref 43–77)
Platelets: 382 10*3/uL (ref 150–400)
RBC: 4.37 MIL/uL (ref 3.87–5.11)
RDW: 15.3 % (ref 11.5–15.5)
WBC: 9.7 10*3/uL (ref 4.0–10.5)

## 2014-12-19 LAB — COMPREHENSIVE METABOLIC PANEL
ALBUMIN: 3.4 g/dL — AB (ref 3.6–5.1)
ALT: 22 U/L (ref 6–29)
AST: 20 U/L (ref 10–35)
Alkaline Phosphatase: 84 U/L (ref 33–130)
BUN: 7 mg/dL (ref 7–25)
CHLORIDE: 102 mmol/L (ref 98–110)
CO2: 27 mmol/L (ref 20–31)
CREATININE: 0.66 mg/dL (ref 0.50–1.05)
Calcium: 8.9 mg/dL (ref 8.6–10.4)
Glucose, Bld: 164 mg/dL — ABNORMAL HIGH (ref 65–99)
Potassium: 4.5 mmol/L (ref 3.5–5.3)
SODIUM: 135 mmol/L (ref 135–146)
Total Bilirubin: 0.2 mg/dL (ref 0.2–1.2)
Total Protein: 6.6 g/dL (ref 6.1–8.1)

## 2014-12-19 LAB — HEMOGLOBIN A1C
Hgb A1c MFr Bld: 7.4 % — ABNORMAL HIGH (ref <5.7)
Mean Plasma Glucose: 166 mg/dL — ABNORMAL HIGH (ref <117)

## 2014-12-20 ENCOUNTER — Telehealth: Payer: Self-pay | Admitting: Student

## 2014-12-20 NOTE — Telephone Encounter (Signed)
Pt because she had an elevated blood sugar to 534. She took her home insulin, then threw up. She rechecked her blood sugar again and it was 236. She had some hand cramping and now feels her hand cramping has improved. Took her blood pressure and it was 136/80 Denies fevers, recent, denies denies diarrhea, CP or SOB. Had some lightheadedness right after vomiting then it resolved Denies increased frequency of urination, denies polydipsia ED precautions provided   Chatara Lucente A. Kennon RoundsHaney MD, MS Family Medicine Resident PGY-2 Pager [phone]

## 2014-12-22 ENCOUNTER — Other Ambulatory Visit: Payer: Self-pay | Admitting: *Deleted

## 2014-12-22 NOTE — Telephone Encounter (Signed)
Spoke with pt she will bump up lantus and will call back in 1 week regarding sugar

## 2014-12-22 NOTE — Telephone Encounter (Signed)
LMTCB

## 2014-12-22 NOTE — Telephone Encounter (Signed)
Been running 200+ but this morning it was 163. Does not want to make an appt at this time said when she takes her sugar at 12:15 if it is in the 200 then she will come in. She said her hands and feet and curling up and cramping when her sugar reached the 536 on Thursday.

## 2014-12-22 NOTE — Telephone Encounter (Signed)
I reviewed message from another doctor on call that her sugars were high this weekend .  If needed, have her come in for appt?  What have her glucose numbers been readings the last 2 days, fasting? Before meals?   Verify she is taking her current regimen of : Janumet oral tablet BID Lantus 20 units QHS And Bydrueon 2mg  weekly injection?   If so, I want to bump up her Lantus, but if her numbers remain in the high 300+ then she needs to come in today or tomorrow.
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
## 2014-12-22 NOTE — Telephone Encounter (Signed)
At 12:15 the pt said it was 219 will call tomorrow and let me know what it is running tonight said she takes her medication faithfully

## 2014-12-22 NOTE — Telephone Encounter (Signed)
Have her call back in 1 wk regarding morning glucose numbers since we are bumping up the lantus to 23 u QHS

## 2014-12-22 NOTE — Telephone Encounter (Signed)
Assuming she is not feeling worse latera or running high sugars, then the only change I want to make is to bump her Lantus to 23 units QHS.   Verify as per message below that she is taking the other medications bydureon weekly, Janumet BID as well.

## 2014-12-22 NOTE — Patient Outreach (Signed)
Call placed to member to schedule follow up home visit (Initial home visit in September).  Daughter has now relocated and is assisting with her care.  No answer, HIPPA compliant voice message left.  Will await call back.  Kemper DurieMonica Hareem Surowiec, BSN, [facility] Care Management  [facility] Care Manager [phone]

## 2015-01-03 ENCOUNTER — Other Ambulatory Visit: Payer: Self-pay | Admitting: Medical

## 2015-01-05 ENCOUNTER — Other Ambulatory Visit: Payer: Self-pay | Admitting: Licensed Clinical Social Worker

## 2015-01-05 NOTE — Telephone Encounter (Signed)
Is this ok to refill?  

## 2015-01-05 NOTE — Patient Outreach (Addendum)
[facility] Care Management  01/05/2015  Cheyenne QuanWilma [name] 19-Nov-1965 [id number]   Assessment-CSW completed outreach to patient on 01/05/15. HIPPA verifications were received. Patient reports that continue to receives personal care services and that her aid "is the best one I've ever had." Patient shares that her aid takes great care of her and "goes over and beyond for me" by coming early and staying later if needed. Patient communicates that her sister visited her yesterday and that she continues to be a major support in her life as well as her neighbor who takes her to doctor's appointments when needed and assist her where needed. Patient communicates that she has been experiencing "a lot of cramps, all over my body." Patient also informed CSW that she is experiencing shortness of breath which is concerning for her. CSW insisted that patient contact doctor's office to update staff on recent symptoms. Patient agreed to contact her doctor's office once her aid has arrived. CSW questioned patient on current psychotropic medications. Patient reports medication adherence and that she medication side effects are not "as bad as I thought they would be." Patient continues to gain mental health [facility]. Patient denies any further social work assistance as she now has personal care services. Patient agreeable to social worker closing case at this time. Patient aware that [facility] patient.   Plan-CSW will inform RNCM [facility] of discharge.  Dickie LaBrooke Ardyce Alvarez, BSW, MSW, LCSW [facility] Care Management [email or web address] Phone: [phone] Fax: [phone]

## 2015-01-06 ENCOUNTER — Other Ambulatory Visit: Payer: Self-pay | Admitting: *Deleted

## 2015-01-06 NOTE — Patient Outreach (Signed)
Call placed to member to follow up on her health status and to schedule a home visit.  Member states that she is out voting and has her grandkids in the car with her, it is difficult for this care manager to hear what the member is saying. She does state that she is doing well and now have a personal care aide working with her every day.  She states that she has an appointment with her PCP tomorrow and request this care manager call her after this appointment to follow up and schedule a home visit.  Will call member back tomorrow.  Kemper DurieMonica Kaleiyah Alvarez, BSN, [facility] Care Management  [facility] Care Manager [phone]

## 2015-01-07 ENCOUNTER — Other Ambulatory Visit: Payer: Self-pay | Admitting: *Deleted

## 2015-01-07 ENCOUNTER — Ambulatory Visit: Payer: Medicare Other | Admitting: Medical

## 2015-01-07 NOTE — Patient Outreach (Signed)
Call placed to member as requested on yesterday to follow up on physician visit.  Member states that she forgot about her appointment today and didn't go.  Member encouraged to call to reschedule as soon as possible.  Routine home visit scheduled for 11/22.  Encouraged to contact this care manager with any concerns.  Kemper DurieMonica Adams Alvarez, BSN, [facility] Care Management  [facility] Care Manager [phone]

## 2015-01-09 ENCOUNTER — Encounter: Payer: Self-pay | Admitting: Medical

## 2015-01-12 ENCOUNTER — Other Ambulatory Visit: Payer: Self-pay | Admitting: Medical

## 2015-01-12 NOTE — Telephone Encounter (Signed)
Is this ok to refill? Pt no showed last appt  

## 2015-01-20 ENCOUNTER — Other Ambulatory Visit: Payer: Self-pay | Admitting: *Deleted

## 2015-01-20 NOTE — Patient Outreach (Signed)
[facility]) Care Management   01/20/2015  Cheyenne Alvarez 27-Sep-1965 [id number]  Cheyenne Alvarez is an 49 y.o. female  Subjective:   Member states "I'm doing alright.Marland KitchenMarland KitchenMy aide comes here in the morning to make sure I have a bath and something to eat.Marland KitchenMarland KitchenShe make sure I take my medicine too.Marland KitchenMarland KitchenMy daughter usually come in the evening to make sure I'm right for bed.Marland KitchenMarland KitchenI take my medicine like they tell me to.Marland KitchenMarland KitchenThey don't let me cook anything, I have food to go in the microwave for dinner."  Objective:   Review of Systems  Constitutional: Negative.   HENT: Negative.   Eyes: Negative.   Respiratory: Negative.   Cardiovascular: Negative.   Gastrointestinal: Negative.   Genitourinary: Negative.   Musculoskeletal: Negative.   Skin: Negative.   Neurological: Negative.   Endo/Heme/Allergies: Negative.   Psychiatric/Behavioral: Negative.     Physical Exam  Constitutional: She is oriented to person, place, and time. She appears well-developed and well-nourished.  Neck: Normal range of motion.  Cardiovascular: Normal rate, regular rhythm and normal heart sounds.   Respiratory: Effort normal and breath sounds normal.  GI: Soft. Bowel sounds are normal.  Musculoskeletal: Normal range of motion.  Neurological: She is alert and oriented to person, place, and time.  Skin: Skin is warm and dry.   BP 112/72 mmHg  Pulse 83  Resp 18  SpO2 98%  Current Medications:   Current Outpatient Prescriptions  Medication Sig Dispense Refill  . albuterol (PROVENTIL HFA;VENTOLIN HFA) 108 (90 BASE) MCG/ACT inhaler Inhale 2 puffs into the lungs every 6 (six) hours as needed for wheezing. For wheezing 18 g 0  . amLODipine (NORVASC) 10 MG tablet Take 1 tablet (10 mg total) by mouth daily. 90 tablet 0  . atenolol (TENORMIN) 50 MG tablet take 1 tablet by mouth twice a day 180 tablet 0  . BENICAR 40 MG tablet take 1 tablet by mouth once daily 30 tablet 2  . busPIRone (BUSPAR) 10 MG  tablet Take 10 mg by mouth 3 (three) times daily.    . diphenhydrAMINE (BENADRYL) 25 mg capsule Take 50 mg by mouth at bedtime.    Marland Kitchen EPINEPHrine (EPIPEN 2-PAK) 0.3 mg/0.3 mL IJ SOAJ injection Inject 0.3 mLs (0.3 mg total) into the muscle once. 1 Device 2  . Exenatide ER 2 MG PEN Inject 2 mg into the skin once a week. 4 each 3  . Fluticasone-Salmeterol (ADVAIR DISKUS) 250-50 MCG/DOSE AEPB Inhale 1 puff into the lungs 2 (two) times daily. 60 each 5  . furosemide (LASIX) 20 MG tablet take 1 tablet by mouth once daily 90 tablet 0  . Insulin Glargine (LANTUS SOLOSTAR) 100 UNIT/ML Solostar Pen Inject 20 Units into the skin daily at 10 pm. 5 pen 3  . JANUMET 50-1000 MG tablet take 1 tablet by mouth twice a day WITH A MEAL 60 tablet 1  . nitroGLYCERIN (NITROSTAT) 0.4 MG SL tablet Place 0.4 mg under the tongue every 5 (five) minutes as needed for chest pain.    . pantoprazole (PROTONIX) 40 MG tablet Take 1 tablet (40 mg total) by mouth 2 (two) times daily. 40 tablet 3  . polyethylene glycol (MIRALAX / GLYCOLAX) packet Take 17 g by mouth daily. 14 each 0  . QUEtiapine (SEROQUEL) 100 MG tablet Take 100 mg by mouth at bedtime.    . rosuvastatin (CRESTOR) 10 MG tablet take 1 tablet by mouth once daily 90 tablet 0  . sertraline (ZOLOFT) 50 MG tablet Take 50  mg by mouth daily.    . sucralfate (CARAFATE) 1 G tablet take 1 tablet by mouth four times a day 15 MINUTES before meals and at bedtime 120 tablet 4  . ziprasidone (GEODON) 20 MG capsule Take 20 mg by mouth 2 (two) times daily with a meal.    . clonazePAM (KLONOPIN) 0.5 MG tablet Take 0.5 mg by mouth 3 (three) times daily as needed for anxiety.     . paliperidone (INVEGA) 6 MG 24 hr tablet Take 6 mg by mouth at bedtime.    . traZODone (DESYREL) 100 MG tablet Take 100 mg by mouth at bedtime.     No current facility-administered medications for this visit.    Functional Status:   In your present state of health, do you have any difficulty performing the  following activities: 12/18/2014 11/26/2014  Hearing? N N  Vision? N N  Difficulty concentrating or making decisions? Cheyenne Alvarez  Walking or climbing stairs? Y N  Dressing or bathing? Y Y  Doing errands, shopping? Cheyenne Alvarez  Preparing Food and eating ? - Y  Using the Toilet? - N  In the past six months, have you accidently leaked urine? - Y  Do you have problems with loss of bowel control? - N  Managing your Medications? - Y  Managing your Finances? - Y  Housekeeping or managing your Housekeeping? - Y    Fall/Depression Screening:    PHQ 2/9 Scores 11/26/2014 11/12/2014  PHQ - 2 Score 2 2  PHQ- 9 Score - 9    Assessment:    Met with member at her home for routine home visit.  Member denies pain or discomfort at this time, but does state that she has some cramping in her hands and feet some.  Member is on Lasix, encouraged to discuss this with her physician.  She missed an appointment with her PCP earlier this month, new appointment rescheduled for Monday.  She reports she has been managed for her mental health at [facility] and have an appointment there next month.    She states that she still has the fear of her children taking her out of her home.  She states that she is "doing what she supposed to do" and do not want to leave her home.  Member encouraged to discuss her desire to keep her own home with her family.  She states that she also want to have a living will and power of attorney done.  Made aware that social worker, B. Blanch Media, will be notified with this request.   Member expresses interest in learning what her medications are for.  Medications reviewed, purpose of each medication written on the labels.  Discussed diabetes management and insulin administration.  Offer provided to have telephonic health coach involved for more disease management, member denies the need.  She states that her family and friends (mostly her daughters and neighbors) help her out as much as possible, stating "there's  always somebody checking on me."    She continues to deny the need for ongoing education, she refuses to be involved with other facilities to promote socialization (such as [facility]), she refuses to move to assisted living for better assistance with her health and medications, and she refuses to move in with her daughter or any other family.  Her only request at this time to have paperwork complete for advanced directive and living will.  She denies any further needs at this time.  Encouraged to contact this care manager  with any questions.  Plan:   Will contact social worker regarding paperwork for living will. Will close case once care coordination is complete.  [facility] CM Care Plan Problem One        Most Recent Value   Care Plan Problem One  Medication Management   Role Documenting the Problem One  Care Management Coordinator   Care Plan for Problem One  Active   [facility] CM Short Term Goal #1 (0-30 days)  Member/family will report that the member is taking all medications without any problems    [facility] CM Short Term Goal #1 Start Date  11/26/14   [facility] CM Short Term Goal #1 Met Date  01/20/15   Interventions for Short Term Goal #1  Discussed medication management with sister, will discuss with daughter once she arrives in [employer], BSN, [place] Manager [phone]

## 2015-01-26 ENCOUNTER — Encounter: Payer: Self-pay | Admitting: Medical

## 2015-01-26 ENCOUNTER — Telehealth: Payer: Self-pay | Admitting: Medical

## 2015-01-26 ENCOUNTER — Ambulatory Visit (INDEPENDENT_AMBULATORY_CARE_PROVIDER_SITE_OTHER): Payer: Medicare Other | Admitting: Medical

## 2015-01-26 VITALS — BP 118/88 | HR 85 | Wt 226.0 lb

## 2015-01-26 DIAGNOSIS — F319 Bipolar disorder, unspecified: Secondary | ICD-10-CM | POA: Diagnosis not present

## 2015-01-26 DIAGNOSIS — R0602 Shortness of breath: Secondary | ICD-10-CM

## 2015-01-26 DIAGNOSIS — R609 Edema, unspecified: Secondary | ICD-10-CM

## 2015-01-26 DIAGNOSIS — F259 Schizoaffective disorder, unspecified: Secondary | ICD-10-CM | POA: Diagnosis not present

## 2015-01-26 DIAGNOSIS — F411 Generalized anxiety disorder: Secondary | ICD-10-CM | POA: Diagnosis not present

## 2015-01-26 DIAGNOSIS — F250 Schizoaffective disorder, bipolar type: Secondary | ICD-10-CM

## 2015-01-26 DIAGNOSIS — R252 Cramp and spasm: Secondary | ICD-10-CM | POA: Diagnosis not present

## 2015-01-26 LAB — BASIC METABOLIC PANEL
BUN: 10 mg/dL (ref 7–25)
CHLORIDE: 101 mmol/L (ref 98–110)
CO2: 25 mmol/L (ref 20–31)
CREATININE: 0.93 mg/dL (ref 0.50–1.05)
Calcium: 9 mg/dL (ref 8.6–10.4)
Glucose, Bld: 106 mg/dL — ABNORMAL HIGH (ref 65–99)
Potassium: 4.4 mmol/L (ref 3.5–5.3)
Sodium: 133 mmol/L — ABNORMAL LOW (ref 135–146)

## 2015-01-26 NOTE — Progress Notes (Signed)
Subjective: Chief Complaint  Patient presents with  . joint pain    said she is having very bad anxiety attacks. and said she was jerking and feels like a zombie. she said her hands and feet are cramping really bad and her legs are hurting her as well. said her main concern is her cramping pains. having bad headaches. needs refills on some medicines as well. said she needs another mental doctor because [facility] is not helping her.   . Depression   Here for mental health concerns.   Feels like her mental state is not the best currently.   Having problems with mood, still hears voices.  Not happy with [facility], wants referral to different provider.   [facility] took her off trazodone and took her Clonazepam away from her.   Not taking Geodon due to listed side effects.   Not taking Seroquel due to weight gain.  She doesn't feel well.   Not sleeping well, hearing voices, wants somebody else to manage her symptoms.   Feels like she needs to be using the clonazepam to help with anxiety.  Doesn't want to move out of her home or lose her home or freedom.     She does note of last few weeks having some leg swelling bilat, some occasional SOB, not related to activity.  Denies chest pain.  Was suppose to have already seen Dr. Sharyn Lull in f/u (cariology) but hasn't.  Having a lot of cramps in hands, legs.  Heart flutters at times.    Been getting some bad headaches.   Sugars still seem to be all over the place.     Daughter Naliah Eddington in [place] is her power of attorney.  Her daughter Inetta Fermo of [employer] [place] and Me[place] Brosh come out and help her, make sure she has food.  Her nurse aid continues to come out regularly which has been a big help.  She and her daughter Whitney Muse are estranged currently.   Past Medical History  Diagnosis Date  . Anxiety   . GERD (gastroesophageal reflux disease)   . Bipolar 1 disorder (HCC)   . Diabetes mellitus 2010  . Hypertension 2010  . Depression   . PTSD  (post-traumatic stress disorder)   . Anemia   . Obesity   . Tobacco use   . PONV (postoperative nausea and vomiting)   . Gastritis   . Pneumonia   . Coronary artery disease   . COPD (chronic obstructive pulmonary disease) (HCC)   . Schizophrenia (HCC)   . Headache     migraines  . Alopecia    Current Outpatient Prescriptions on File Prior to Visit  Medication Sig Dispense Refill  . albuterol (PROVENTIL HFA;VENTOLIN HFA) 108 (90 BASE) MCG/ACT inhaler Inhale 2 puffs into the lungs every 6 (six) hours as needed for wheezing. For wheezing 18 g 0  . amLODipine (NORVASC) 10 MG tablet Take 1 tablet (10 mg total) by mouth daily. 90 tablet 0  . atenolol (TENORMIN) 50 MG tablet take 1 tablet by mouth twice a day 180 tablet 0  . BENICAR 40 MG tablet take 1 tablet by mouth once daily 30 tablet 2  . busPIRone (BUSPAR) 10 MG tablet Take 10 mg by mouth 3 (three) times daily.    Marland Kitchen EPINEPHrine (EPIPEN 2-PAK) 0.3 mg/0.3 mL IJ SOAJ injection Inject 0.3 mLs (0.3 mg total) into the muscle once. 1 Device 2  . Exenatide ER 2 MG PEN Inject 2 mg into the skin once  a week. 4 each 3  . Fluticasone-Salmeterol (ADVAIR DISKUS) 250-50 MCG/DOSE AEPB Inhale 1 puff into the lungs 2 (two) times daily. 60 each 5  . furosemide (LASIX) 20 MG tablet take 1 tablet by mouth once daily 90 tablet 0  . Insulin Glargine (LANTUS SOLOSTAR) 100 UNIT/ML Solostar Pen Inject 20 Units into the skin daily at 10 pm. 5 pen 3  . JANUMET 50-1000 MG tablet take 1 tablet by mouth twice a day WITH A MEAL 60 tablet 1  . paliperidone (INVEGA) 6 MG 24 hr tablet Take 6 mg by mouth at bedtime.    . pantoprazole (PROTONIX) 40 MG tablet Take 1 tablet (40 mg total) by mouth 2 (two) times daily. 40 tablet 3  . polyethylene glycol (MIRALAX / GLYCOLAX) packet Take 17 g by mouth daily. 14 each 0  . rosuvastatin (CRESTOR) 10 MG tablet take 1 tablet by mouth once daily 90 tablet 0  . sertraline (ZOLOFT) 50 MG tablet Take 50 mg by mouth daily.    .  sucralfate (CARAFATE) 1 G tablet take 1 tablet by mouth four times a day 15 MINUTES before meals and at bedtime 120 tablet 4  . clonazePAM (KLONOPIN) 0.5 MG tablet Take 0.5 mg by mouth 3 (three) times daily as needed for anxiety.     . diphenhydrAMINE (BENADRYL) 25 mg capsule Take 50 mg by mouth at bedtime.    . nitroGLYCERIN (NITROSTAT) 0.4 MG SL tablet Place 0.4 mg under the tongue every 5 (five) minutes as needed for chest pain.    Marland Kitchen. QUEtiapine (SEROQUEL) 100 MG tablet Take 100 mg by mouth at bedtime.    . traZODone (DESYREL) 100 MG tablet Take 100 mg by mouth at bedtime.    . ziprasidone (GEODON) 20 MG capsule Take 20 mg by mouth 2 (two) times daily with a meal.     No current facility-administered medications on file prior to visit.     ROS as in subjective   Objective: BP 118/88 mmHg  Pulse 85  Wt 226 lb (102.513 kg)  Wt Readings from Last 3 Encounters:  01/26/15 226 lb (102.513 kg)  12/19/14 219 lb (99.338 kg)  11/26/14 216 lb (97.977 kg)   Gen: wd, wn, nad Heart: RRR, normal s1, s2, no murmurs Lungs clear Ext: no edema Pulses normal Psych: anxious at times, sometimes poor eye contact, answers questions appropriately today, but still seems very fragile in her mental state, demeanor is cautious.   Assessment: Encounter Diagnoses  Name Primary?  . SOB (shortness of breath) Yes  . Edema, unspecified type   . BIPOLAR AFFECTIVE DISORDER   . Schizoaffective disorder, bipolar type (HCC)   . Generalized anxiety disorder   . Muscle cramp    Plan: reviewed her recent case management visit.  Will call and try to get her lined up with a different psychiatrist.    She is noncompliant with some of her medication.  She does have home health aid which has been very helpful to her regarding ADLs.  There have been prior concerns about her leaving stone burners on, so she doesn't cook for herself.   I discussed the idea of assisted living today but she had absolutely on interested in  that or taking away her independence.   Discussed the benefits of a supervised environment.   Edema - labs today, will refer back to cardiology for recheck Cramps - f/u pending labs.

## 2015-01-26 NOTE — Telephone Encounter (Signed)
1 - please hunt down a psychiatrist that will take her insurance.   She is unhappy with [facility], but has Medicare/medicaid.  I would start with [facility] or [facility] or [facility] in that order  2 - as we discussed earlier, get her in to see Dr. Marni GriffonHawani ASAP

## 2015-01-27 DIAGNOSIS — M1990 Unspecified osteoarthritis, unspecified site: Secondary | ICD-10-CM | POA: Diagnosis not present

## 2015-01-27 DIAGNOSIS — E785 Hyperlipidemia, unspecified: Secondary | ICD-10-CM | POA: Diagnosis not present

## 2015-01-27 DIAGNOSIS — F319 Bipolar disorder, unspecified: Secondary | ICD-10-CM | POA: Diagnosis not present

## 2015-01-27 DIAGNOSIS — E669 Obesity, unspecified: Secondary | ICD-10-CM | POA: Diagnosis not present

## 2015-01-27 DIAGNOSIS — I10 Essential (primary) hypertension: Secondary | ICD-10-CM | POA: Diagnosis not present

## 2015-01-27 DIAGNOSIS — I2510 Atherosclerotic heart disease of native coronary artery without angina pectoris: Secondary | ICD-10-CM | POA: Diagnosis not present

## 2015-01-27 DIAGNOSIS — E119 Type 2 diabetes mellitus without complications: Secondary | ICD-10-CM | POA: Diagnosis not present

## 2015-01-27 DIAGNOSIS — K219 Gastro-esophageal reflux disease without esophagitis: Secondary | ICD-10-CM | POA: Diagnosis not present

## 2015-01-27 LAB — BRAIN NATRIURETIC PEPTIDE: Brain Natriuretic Peptide: 5.7 pg/mL (ref 0.0–100.0)

## 2015-01-27 NOTE — Telephone Encounter (Signed)
Her appt with dr Adline Pealshwani office is today. [facility] is a no and pending callback from behavioral health

## 2015-02-02 ENCOUNTER — Other Ambulatory Visit: Payer: Self-pay | Admitting: Medical

## 2015-02-02 DIAGNOSIS — F250 Schizoaffective disorder, bipolar type: Secondary | ICD-10-CM | POA: Diagnosis not present

## 2015-02-02 NOTE — Telephone Encounter (Signed)
I would give her number to [facility] and [facility] and she can call for apt.

## 2015-02-02 NOTE — Telephone Encounter (Signed)
So [facility] and dr Delle Reiningkaurr is a no, behavior health has not gotten back to me at all. What would you like me to do?

## 2015-02-03 NOTE — Telephone Encounter (Signed)
Pt stated she would not go to behavioral health because they are in "cahoots" with [facility]. Is calling [facility] today. Also asked about her lab work but I havent gotten a result note on them so I did not go over the labs done on 11/28 with her

## 2015-02-04 ENCOUNTER — Other Ambulatory Visit: Payer: Self-pay | Admitting: Medical

## 2015-02-05 ENCOUNTER — Telehealth: Payer: Self-pay | Admitting: Family Medicine

## 2015-02-05 NOTE — Telephone Encounter (Signed)
[employer] called & states pt's P.A. For Lantus will expire on 02/28/15 and they recommend changing to formulary alternative of Levemir or Evaristo Buryresiba which will be cheaper for patient.  Do you want to switch for January? Or do you want me to do another P.A. ?

## 2015-02-06 ENCOUNTER — Other Ambulatory Visit: Payer: Self-pay | Admitting: Medical

## 2015-02-06 MED ORDER — INSULIN DEGLUDEC 100 UNIT/ML ~~LOC~~ SOPN: 20.0000 [IU] | PEN_INJECTOR | Freq: Every day | SUBCUTANEOUS | Status: DC

## 2015-02-06 NOTE — Telephone Encounter (Signed)
I changed it to Guinea-Bissauresiba.   So make sure she understands that once she runs out of Lantus, she will change to the same units of Tresiba, night time only insulin.

## 2015-02-10 ENCOUNTER — Other Ambulatory Visit: Payer: Self-pay | Admitting: Medical

## 2015-02-10 MED ORDER — OLMESARTAN MEDOXOMIL 40 MG PO TABS: 40.0000 mg | ORAL_TABLET | Freq: Every day | ORAL | Status: DC

## 2015-02-10 MED ORDER — AMLODIPINE BESYLATE 10 MG PO TABS: 10.0000 mg | ORAL_TABLET | Freq: Every day | ORAL | Status: DC

## 2015-02-10 MED ORDER — ATENOLOL 50 MG PO TABS: 50.0000 mg | ORAL_TABLET | Freq: Two times a day (BID) | ORAL | Status: DC

## 2015-02-10 MED ORDER — FUROSEMIDE 20 MG PO TABS: 20.0000 mg | ORAL_TABLET | Freq: Every day | ORAL | Status: DC

## 2015-02-17 NOTE — Telephone Encounter (Signed)
Pt informed

## 2015-02-27 ENCOUNTER — Other Ambulatory Visit: Payer: Self-pay | Admitting: *Deleted

## 2015-02-27 NOTE — Patient Outreach (Signed)
Call placed to member to follow up on current health condition and the need for further [facility] care [facility] involvement, possibly transitioning from community involvement to a health coach.  The number that was previously active for member [phone]) now states that it is an inactive number.  Will make another attempt to contact next week, also will contact daughter [name].  Kemper DurieMonica Ayliana Casciano, BSN, [facility] Care Management  [facility] Care Manager [phone]

## 2015-03-02 ENCOUNTER — Other Ambulatory Visit: Payer: Self-pay | Admitting: Medical

## 2015-03-06 ENCOUNTER — Telehealth: Payer: Self-pay | Admitting: Medical

## 2015-03-06 NOTE — Telephone Encounter (Signed)
P.A. LANTUS SOLOSTAR Approved til 02/28/16, called pharmacy & went thru for $3.70.  So I cancelled the [place] and will keep pt on Lantus instead of changing.  Left message for pt

## 2015-03-09 ENCOUNTER — Encounter: Payer: Self-pay | Admitting: *Deleted

## 2015-03-09 ENCOUNTER — Other Ambulatory Visit: Payer: Self-pay | Admitting: *Deleted

## 2015-03-09 NOTE — Patient Outreach (Signed)
Call placed to member to follow up on current health status, number most recently contacted [phone]) is no longer active.  Call then placed to daughter, [name], to inquire about member's current status.  Daughter states that the member's number has changed, new number received.  She states that the member is doing well, stating that [place] now [place] and is able to help the member more along with other friends, family, and neighbors.  She denies any concerns for the member or the need for further involvement.  She is made aware that if the member should need any assistance or resources in the future to contact [facility].  Call placed to member at new number provided by daughter.  She states that she is "good."  She denies any concerns at this time.  Member is aware that according to most recent notes, she will continue to use Lantus instead of the most recently prescribed Tresiba.  She reports that she has another appointment with her PCP on 1/23 and will direct any other questions that come up to him.  Member states that she does not need any further assistance from this care manager.  Education through the health coach offered again, member denies the need.  She is made aware that if needs arise in the future to contact the [facility] main office line for assistance.    Will notify PCP and care management assistant of case closure.  Kemper DurieMonica Janise Gora, BSN, [facility] Care Management  [facility] Care [facility] [phone]

## 2015-03-09 NOTE — Progress Notes (Signed)
This encounter was created in error - please disregard.

## 2015-03-23 ENCOUNTER — Ambulatory Visit (INDEPENDENT_AMBULATORY_CARE_PROVIDER_SITE_OTHER): Payer: Medicare Other | Admitting: Medical

## 2015-03-23 ENCOUNTER — Encounter: Payer: Self-pay | Admitting: Medical

## 2015-03-23 VITALS — BP 130/88 | HR 93 | Wt 222.0 lb

## 2015-03-23 DIAGNOSIS — F259 Schizoaffective disorder, unspecified: Secondary | ICD-10-CM | POA: Diagnosis not present

## 2015-03-23 DIAGNOSIS — Z789 Other specified health status: Secondary | ICD-10-CM

## 2015-03-23 DIAGNOSIS — I10 Essential (primary) hypertension: Secondary | ICD-10-CM

## 2015-03-23 DIAGNOSIS — R252 Cramp and spasm: Secondary | ICD-10-CM | POA: Diagnosis not present

## 2015-03-23 DIAGNOSIS — Z289 Immunization not carried out for unspecified reason: Secondary | ICD-10-CM | POA: Diagnosis not present

## 2015-03-23 DIAGNOSIS — Z2820 Immunization not carried out because of patient decision for unspecified reason: Secondary | ICD-10-CM | POA: Insufficient documentation

## 2015-03-23 DIAGNOSIS — E1165 Type 2 diabetes mellitus with hyperglycemia: Secondary | ICD-10-CM

## 2015-03-23 DIAGNOSIS — E1140 Type 2 diabetes mellitus with diabetic neuropathy, unspecified: Secondary | ICD-10-CM

## 2015-03-23 DIAGNOSIS — F319 Bipolar disorder, unspecified: Secondary | ICD-10-CM

## 2015-03-23 DIAGNOSIS — Z659 Problem related to unspecified psychosocial circumstances: Secondary | ICD-10-CM | POA: Diagnosis not present

## 2015-03-23 DIAGNOSIS — IMO0002 Reserved for concepts with insufficient information to code with codable children: Secondary | ICD-10-CM

## 2015-03-23 DIAGNOSIS — F250 Schizoaffective disorder, bipolar type: Secondary | ICD-10-CM | POA: Diagnosis not present

## 2015-03-23 DIAGNOSIS — R419 Unspecified symptoms and signs involving cognitive functions and awareness: Secondary | ICD-10-CM | POA: Insufficient documentation

## 2015-03-23 DIAGNOSIS — Z794 Long term (current) use of insulin: Secondary | ICD-10-CM | POA: Diagnosis not present

## 2015-03-23 DIAGNOSIS — F411 Generalized anxiety disorder: Secondary | ICD-10-CM

## 2015-03-23 DIAGNOSIS — Z2821 Immunization not carried out because of patient refusal: Secondary | ICD-10-CM | POA: Diagnosis not present

## 2015-03-23 LAB — COMPREHENSIVE METABOLIC PANEL
ALBUMIN: 3.6 g/dL (ref 3.6–5.1)
ALT: 20 U/L (ref 6–29)
AST: 10 U/L (ref 10–35)
Alkaline Phosphatase: 99 U/L (ref 33–130)
BUN: 10 mg/dL (ref 7–25)
CHLORIDE: 102 mmol/L (ref 98–110)
CO2: 25 mmol/L (ref 20–31)
CREATININE: 0.73 mg/dL (ref 0.50–1.05)
Calcium: 9 mg/dL (ref 8.6–10.4)
Glucose, Bld: 152 mg/dL — ABNORMAL HIGH (ref 65–99)
POTASSIUM: 4.3 mmol/L (ref 3.5–5.3)
Sodium: 138 mmol/L (ref 135–146)
TOTAL PROTEIN: 6.6 g/dL (ref 6.1–8.1)
Total Bilirubin: 0.2 mg/dL (ref 0.2–1.2)

## 2015-03-23 LAB — LIPID PANEL
CHOL/HDL RATIO: 2.6 ratio (ref <=5.0)
CHOLESTEROL: 157 mg/dL (ref 125–200)
HDL: 60 mg/dL (ref >=46)
LDL Cholesterol: 81 mg/dL (ref <130)
Triglycerides: 78 mg/dL (ref <150)
VLDL: 16 mg/dL (ref <30)

## 2015-03-23 LAB — CBC
HCT: 35.3 % — ABNORMAL LOW (ref 36.0–46.0)
HEMOGLOBIN: 11.4 g/dL — AB (ref 12.0–15.0)
MCH: 26.6 pg (ref 26.0–34.0)
MCHC: 32.3 g/dL (ref 30.0–36.0)
MCV: 82.5 fL (ref 78.0–100.0)
MPV: 9.3 fL (ref 8.6–12.4)
PLATELETS: 399 10*3/uL (ref 150–400)
RBC: 4.28 MIL/uL (ref 3.87–5.11)
RDW: 16.1 % — ABNORMAL HIGH (ref 11.5–15.5)
WBC: 11.8 10*3/uL — ABNORMAL HIGH (ref 4.0–10.5)

## 2015-03-23 LAB — POCT GLYCOSYLATED HEMOGLOBIN (HGB A1C): Hemoglobin A1C: 7.8

## 2015-03-23 NOTE — Patient Instructions (Signed)
  High blood pressure   Continue Norvasc  daily in the morning  Continue Atenolol  daily in the morning  Continue Benicar  daily in the morning  Continue Lasix  daily in the morning  High cholesterol  Continue Crestor  daily in the evening  Diabetes  Continue Janumet 50/1000mg  twice daily  Continue Lantus but increase to 25 units nightly  Continue Bydureon weekly injection  Acid reflux  Continue Protonix  daily in the morning  Schizoaffective/Biplar disorder  Continue your current medicaitons and follow up with Dr. Omelia Blackwater next week as scheduled

## 2015-03-23 NOTE — Progress Notes (Signed)
Subjective: Chief Complaint  Patient presents with  . Follow-up    said she can not get her sugar or bp right. sugars have been running in the 300s and said her bp was 254/110 last night. took nitroglycerin for heart flutters. has taken medicine and eaten this morning. has a new doctor appt on 31st. [facility], K. Headen. pt is unclear on medications   Here for recheck.  Last visit here 01/26/15.   Will be seeing Dr. Omelia Blackwater on 03/31/15 to establish for psychiatry.  Still has nurse aide coming out to help with medications, ADLs.   Had bad episode few days ago.   Her children came out the other day, wanted to put her back in mental hospital.  Felt she wasn't taken her medications properly.   They don't want her driving.  Still has hallucinations regularly   The nurse Aide helps bath her, aide will cook some, helps her get dressed.  Aide comes back and gets her in the bed, locks the door.  Not using stove, worried about leaving stove on.   Loves her home, likes her space, doesn't like the idea of being in a nursing home.  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  Worries that she will lose hope if she is in a home or assisted facility  Gets angry, doesn't thinks she is her self, "neece".   Wants the old neece back.  Has trouble getting thoughts out.  Otherwise states she is compliant medications.  No other aggravating or relieving factors. No other complaint.  Past Medical History  Diagnosis Date  . Anxiety   . GERD (gastroesophageal reflux disease)   . Bipolar 1 disorder (HCC)   . Diabetes mellitus 2010  . Hypertension 2010  . Depression   . PTSD (post-traumatic stress disorder)   . Anemia   . Obesity   . Tobacco use   . PONV (postoperative nausea and vomiting)   . Gastritis   . Pneumonia   . Coronary artery disease   . COPD (chronic obstructive pulmonary disease) (HCC)   . Schizophrenia (HCC)   . Headache     migraines  . Alopecia    Current Outpatient Prescriptions on File Prior  to Visit  Medication Sig Dispense Refill  . albuterol (PROVENTIL HFA;VENTOLIN HFA) 108 (90 BASE) MCG/ACT inhaler Inhale 2 puffs into the lungs every 6 (six) hours as needed for wheezing. For wheezing 18 g 0  . amLODipine (NORVASC) 10 MG tablet Take 1 tablet (10 mg total) by mouth daily. 90 tablet 1  . atenolol (TENORMIN) 50 MG tablet Take 1 tablet (50 mg total) by mouth 2 (two) times daily. 180 tablet 1  . busPIRone (BUSPAR) 10 MG tablet Take 10 mg by mouth 3 (three) times daily.    . clonazePAM (KLONOPIN) 0.5 MG tablet Take 0.5 mg by mouth 3 (three) times daily as needed for anxiety.     . diphenhydrAMINE (BENADRYL) 25 mg capsule Take 50 mg by mouth at bedtime. Reported on 03/23/2015    . EPINEPHrine (EPIPEN 2-PAK) 0.3 mg/0.3 mL IJ SOAJ injection Inject 0.3 mLs (0.3 mg total) into the muscle once. 1 Device 2  . Exenatide ER 2 MG PEN Inject 2 mg into the skin once a week. 4 each 3  . Fluticasone-Salmeterol (ADVAIR DISKUS) 250-50 MCG/DOSE AEPB Inhale 1 puff into the lungs 2 (two) times daily. 60 each 5  . furosemide (LASIX) 20 MG tablet Take 1 tablet (20 mg total) by mouth  daily. 90 tablet 1  . Insulin Glargine (LANTUS SOLOSTAR) 100 UNIT/ML Solostar Pen Inject 20 Units into the skin daily at 10 pm. 5 pen 3  . JANUMET 50-1000 MG tablet take 1 tablet by mouth twice a day WITH A MEAL 60 tablet 1  . nitroGLYCERIN (NITROSTAT) 0.4 MG SL tablet Place 0.4 mg under the tongue every 5 (five) minutes as needed for chest pain.    Marland Kitchen olmesartan (BENICAR) 40 MG tablet Take 1 tablet (40 mg total) by mouth daily. 90 tablet 1  . paliperidone (INVEGA) 6 MG 24 hr tablet Take 6 mg by mouth at bedtime.    . pantoprazole (PROTONIX) 40 MG tablet take 1 tablet by mouth twice a day 40 tablet 3  . polyethylene glycol (MIRALAX / GLYCOLAX) packet Take 17 g by mouth daily. 14 each 0  . rosuvastatin (CRESTOR) 10 MG tablet take 1 tablet by mouth once daily 90 tablet 0  . sertraline (ZOLOFT) 50 MG tablet Take 50 mg by mouth daily.     . sucralfate (CARAFATE) 1 G tablet take 1 tablet by mouth four times a day 15 MINUTES before meals and at bedtime 120 tablet 4  . traZODone (DESYREL) 100 MG tablet Take 100 mg by mouth at bedtime.    . ziprasidone (GEODON) 20 MG capsule Take 20 mg by mouth 2 (two) times daily with a meal.    . QUEtiapine (SEROQUEL) 100 MG tablet Take 100 mg by mouth at bedtime. Reported on 03/23/2015     No current facility-administered medications on file prior to visit.    ROS as in subjective   Objective: BP 130/88 mmHg  Pulse 93  Wt 222 lb (100.699 kg)  Gen: wd, wn, nad Oral cavity: MMM, no lesions Neck: supple, no lymphadenopathy, no thyromegaly, no masses Heart: RRR, normal S1, S2, no murmurs Lungs: CTA bilaterally, no wheezes, rhonchi, or rales Abdomen: +bs, soft, non tender, non distended, no masses, no hepatomegaly, no splenomegaly Pulses: 2+ symmetric, upper and lower extremities, normal cap refill Ext: no edema Psych: pleasant, good eye contacts, answers questions appropriately but can't get upset and tearful very quick just mentioning her ability to be independent, starts pleading to not put her in a home Feet: see separate foot exam   Diabetic Foot Exam - Simple   Simple Foot Form  Diabetic Foot exam was performed with the following findings:  Yes 03/23/2015 12:23 PM  Visual Inspection  No deformities, no ulcerations, no other skin breakdown bilaterally:  Yes  Sensation Testing  Intact to touch and monofilament testing bilaterally:  Yes  Pulse Check  Posterior Tibialis and Dorsalis pulse intact bilaterally:  Yes  Comments       Assessment: Encounter Diagnoses  Name Primary?  Marland Kitchen BIPOLAR AFFECTIVE DISORDER Yes  . Schizoaffective disorder, bipolar type (HCC)   . Essential hypertension   . Uncontrolled type 2 diabetes mellitus with diabetic neuropathy, with long-term current use of insulin (HCC)   . Generalized anxiety disorder   . Decreased activities of daily living (ADL)    . Cognitive safety issue      Plan: discussed her concerns.   Discussed pros/cons of assisted living/SNF vs begin at home.  discussed safety, medication compliance, her overall medical problems.   She will be seeing a new psychiatrist next week.   The only medication change today is increasing Lantus as below.  She is not 100% sure what psychiatric medications she is taking.  These were last filled by [facility].  High blood pressure   Continue Norvasc  daily in the morning  Continue Atenolol  daily in the morning  Continue Benicar  daily in the morning  Continue Lasix  daily in the morning  High cholesterol  Continue Crestor  daily in the evening  Diabetes  Continue Janumet 50/1000mg  twice daily  Continue Lantus but increase to 25 units nightly  Continue Bydureon weekly injection  Acid reflux  Continue Protonix  daily in the morning  Schizoaffective/Bipolar disorder  Continue your current medications and follow up with Dr. Omelia Blackwater next week as scheduled  She declines flu and pneumococcal vaccines today.

## 2015-03-24 ENCOUNTER — Other Ambulatory Visit: Payer: Self-pay | Admitting: Medical

## 2015-03-24 MED ORDER — SITAGLIPTIN PHOS-METFORMIN HCL 50-1000 MG PO TABS: ORAL_TABLET | ORAL | Status: DC

## 2015-03-24 MED ORDER — ROSUVASTATIN CALCIUM 10 MG PO TABS: 10.0000 mg | ORAL_TABLET | Freq: Every day | ORAL | Status: DC

## 2015-03-24 MED ORDER — EXENATIDE ER 2 MG ~~LOC~~ PEN: 2.0000 mg | PEN_INJECTOR | SUBCUTANEOUS | Status: DC

## 2015-03-24 MED ORDER — INSULIN GLARGINE 100 UNIT/ML SOLOSTAR PEN: 25.0000 [IU] | PEN_INJECTOR | Freq: Every day | SUBCUTANEOUS | Status: DC

## 2015-03-24 MED ORDER — FERROUS SULFATE 325 (65 FE) MG PO TABS: 325.0000 mg | ORAL_TABLET | Freq: Two times a day (BID) | ORAL | Status: DC

## 2015-03-25 NOTE — Telephone Encounter (Signed)
Pt had OV & Vincenza Hews went over meds

## 2015-03-31 DIAGNOSIS — F250 Schizoaffective disorder, bipolar type: Secondary | ICD-10-CM | POA: Diagnosis not present

## 2015-04-07 ENCOUNTER — Emergency Department (HOSPITAL_COMMUNITY): Payer: Medicare Other

## 2015-04-07 ENCOUNTER — Ambulatory Visit (INDEPENDENT_AMBULATORY_CARE_PROVIDER_SITE_OTHER): Payer: Medicare Other | Admitting: Medical

## 2015-04-07 ENCOUNTER — Encounter (HOSPITAL_COMMUNITY): Payer: Self-pay | Admitting: Emergency Medicine

## 2015-04-07 ENCOUNTER — Emergency Department (HOSPITAL_COMMUNITY)
Admission: EM | Admit: 2015-04-07 | Discharge: 2015-04-07 | Disposition: A | Discharge status: Home / Self Care | Payer: Medicare Other | Attending: Emergency Medicine | Admitting: Emergency Medicine

## 2015-04-07 ENCOUNTER — Encounter: Payer: Self-pay | Admitting: Medical

## 2015-04-07 VITALS — BP 130/88 | HR 93 | Wt 228.0 lb

## 2015-04-07 DIAGNOSIS — Z8701 Personal history of pneumonia (recurrent): Secondary | ICD-10-CM | POA: Diagnosis not present

## 2015-04-07 DIAGNOSIS — E119 Type 2 diabetes mellitus without complications: Secondary | ICD-10-CM | POA: Insufficient documentation

## 2015-04-07 DIAGNOSIS — Z9889 Other specified postprocedural states: Secondary | ICD-10-CM | POA: Diagnosis not present

## 2015-04-07 DIAGNOSIS — I10 Essential (primary) hypertension: Secondary | ICD-10-CM | POA: Diagnosis not present

## 2015-04-07 DIAGNOSIS — F319 Bipolar disorder, unspecified: Secondary | ICD-10-CM | POA: Diagnosis not present

## 2015-04-07 DIAGNOSIS — I2510 Atherosclerotic heart disease of native coronary artery without angina pectoris: Secondary | ICD-10-CM | POA: Insufficient documentation

## 2015-04-07 DIAGNOSIS — D649 Anemia, unspecified: Secondary | ICD-10-CM | POA: Insufficient documentation

## 2015-04-07 DIAGNOSIS — Z9049 Acquired absence of other specified parts of digestive tract: Secondary | ICD-10-CM | POA: Insufficient documentation

## 2015-04-07 DIAGNOSIS — R1030 Lower abdominal pain, unspecified: Secondary | ICD-10-CM

## 2015-04-07 DIAGNOSIS — G43909 Migraine, unspecified, not intractable, without status migrainosus: Secondary | ICD-10-CM | POA: Insufficient documentation

## 2015-04-07 DIAGNOSIS — Z79899 Other long term (current) drug therapy: Secondary | ICD-10-CM | POA: Insufficient documentation

## 2015-04-07 DIAGNOSIS — R300 Dysuria: Secondary | ICD-10-CM

## 2015-04-07 DIAGNOSIS — K219 Gastro-esophageal reflux disease without esophagitis: Secondary | ICD-10-CM | POA: Diagnosis not present

## 2015-04-07 DIAGNOSIS — Z90710 Acquired absence of both cervix and uterus: Secondary | ICD-10-CM | POA: Diagnosis not present

## 2015-04-07 DIAGNOSIS — F419 Anxiety disorder, unspecified: Secondary | ICD-10-CM | POA: Insufficient documentation

## 2015-04-07 DIAGNOSIS — J449 Chronic obstructive pulmonary disease, unspecified: Secondary | ICD-10-CM | POA: Insufficient documentation

## 2015-04-07 DIAGNOSIS — Z7951 Long term (current) use of inhaled steroids: Secondary | ICD-10-CM | POA: Insufficient documentation

## 2015-04-07 DIAGNOSIS — Z9851 Tubal ligation status: Secondary | ICD-10-CM | POA: Insufficient documentation

## 2015-04-07 DIAGNOSIS — R109 Unspecified abdominal pain: Secondary | ICD-10-CM | POA: Diagnosis not present

## 2015-04-07 DIAGNOSIS — Z794 Long term (current) use of insulin: Secondary | ICD-10-CM | POA: Diagnosis not present

## 2015-04-07 DIAGNOSIS — R339 Retention of urine, unspecified: Secondary | ICD-10-CM | POA: Diagnosis not present

## 2015-04-07 DIAGNOSIS — R252 Cramp and spasm: Secondary | ICD-10-CM

## 2015-04-07 DIAGNOSIS — Z872 Personal history of diseases of the skin and subcutaneous tissue: Secondary | ICD-10-CM | POA: Insufficient documentation

## 2015-04-07 DIAGNOSIS — F17200 Nicotine dependence, unspecified, uncomplicated: Secondary | ICD-10-CM | POA: Diagnosis not present

## 2015-04-07 DIAGNOSIS — F4310 Post-traumatic stress disorder, unspecified: Secondary | ICD-10-CM | POA: Insufficient documentation

## 2015-04-07 DIAGNOSIS — E669 Obesity, unspecified: Secondary | ICD-10-CM | POA: Insufficient documentation

## 2015-04-07 DIAGNOSIS — R338 Other retention of urine: Secondary | ICD-10-CM

## 2015-04-07 DIAGNOSIS — Z7984 Long term (current) use of oral hypoglycemic drugs: Secondary | ICD-10-CM | POA: Diagnosis not present

## 2015-04-07 LAB — CBC
HEMATOCRIT: 33.1 % — AB (ref 36.0–46.0)
HEMOGLOBIN: 10.6 g/dL — AB (ref 12.0–15.0)
MCH: 26.5 pg (ref 26.0–34.0)
MCHC: 32 g/dL (ref 30.0–36.0)
MCV: 82.8 fL (ref 78.0–100.0)
PLATELETS: 388 10*3/uL (ref 150–400)
RBC: 4 MIL/uL (ref 3.87–5.11)
RDW: 16.1 % — ABNORMAL HIGH (ref 11.5–15.5)
WBC: 9.9 10*3/uL (ref 4.0–10.5)

## 2015-04-07 LAB — COMPREHENSIVE METABOLIC PANEL
ALT: 14 U/L (ref 14–54)
ANION GAP: 9 (ref 5–15)
AST: 16 U/L (ref 15–41)
Albumin: 3.6 g/dL (ref 3.5–5.0)
Alkaline Phosphatase: 86 U/L (ref 38–126)
BUN: 7 mg/dL (ref 6–20)
CHLORIDE: 107 mmol/L (ref 101–111)
CO2: 24 mmol/L (ref 22–32)
Calcium: 8.7 mg/dL — ABNORMAL LOW (ref 8.9–10.3)
Creatinine, Ser: 0.85 mg/dL (ref 0.44–1.00)
Glucose, Bld: 119 mg/dL — ABNORMAL HIGH (ref 65–99)
POTASSIUM: 4.2 mmol/L (ref 3.5–5.1)
Sodium: 140 mmol/L (ref 135–145)
Total Bilirubin: 0.4 mg/dL (ref 0.3–1.2)
Total Protein: 7.3 g/dL (ref 6.5–8.1)

## 2015-04-07 LAB — URINALYSIS, ROUTINE W REFLEX MICROSCOPIC
Bilirubin Urine: NEGATIVE
GLUCOSE, UA: NEGATIVE mg/dL
Ketones, ur: NEGATIVE mg/dL
LEUKOCYTES UA: NEGATIVE
Nitrite: NEGATIVE
PH: 6 (ref 5.0–8.0)
PROTEIN: NEGATIVE mg/dL
SPECIFIC GRAVITY, URINE: 1.014 (ref 1.005–1.030)

## 2015-04-07 LAB — URINE MICROSCOPIC-ADD ON

## 2015-04-07 LAB — LIPASE, BLOOD: Lipase: 31 U/L (ref 11–51)

## 2015-04-07 MED ORDER — SODIUM CHLORIDE 0.9 % IV BOLUS (SEPSIS): 500.0000 mL | Freq: Once | INTRAVENOUS | Status: DC

## 2015-04-07 NOTE — ED Notes (Signed)
Informed MD of bladder scan results

## 2015-04-07 NOTE — ED Notes (Addendum)
Pt states that she was sent here from her PCP because she is having urinary retention and lt flank pain radiating to groin.  Pt states that she "hasn't urinated in 2 days".  When pressed further about this, pt states "well I took my fluid pill and that came out but it didn't feel like it came out of my bladder".  Pt states that she had a friend told her to drink some beer to "flush her system, you know?" Pt states that she told this friend "The devil IS a lie!" and ignored this layman's medical advice.  States that both of her feet have been cramping.  Pt states that her bladder does not feel full.

## 2015-04-07 NOTE — ED Notes (Signed)
Patient tolerating PO fluids 

## 2015-04-07 NOTE — Progress Notes (Signed)
Subjective: Chief Complaint  Patient presents with  . bp low    96/64 when she checked this morning. has no urinated in 2 days. in her lower left back feels like a stabbing pain that wraps around. drinking lots of water and cranberry juice. not at behavioral health and is now going [facility]. legs and hands are cramping up and causing her to fall.   Here for low BP and problems urinating.   She notes that she has not passed any urine in the past 2 days.   She notes drinking lots of water daily, is still taking lasix daily, but denies any changes in her daily medications.  She did establish with a new psychiatrist recently but denies any medication changes.   Although Trazodone listed, she denies taking this.   She notes no prior hx/o urinary retention.  Denies any symptoms of UTI, and hasn't had UTI in a while.  Denies burning, itching, fever, recent urine odor or vaginal concerns.  Not drinking alcohol.  She does note compliance with medications.   She currently reports lower abdominal pain and pressure, worse on the left lower, left flank pain.  No other aggravating or relieving factors. No other complaint.  Past Medical History  Diagnosis Date  . Anxiety   . GERD (gastroesophageal reflux disease)   . Bipolar 1 disorder (HCC)   . Diabetes mellitus 2010  . Hypertension 2010  . Depression   . PTSD (post-traumatic stress disorder)   . Anemia   . Obesity   . Tobacco use   . PONV (postoperative nausea and vomiting)   . Gastritis   . Pneumonia   . Coronary artery disease   . COPD (chronic obstructive pulmonary disease) (HCC)   . Schizophrenia (HCC)   . Headache     migraines  . Alopecia    ROS as in subjective  Objective: BP 130/88 mmHg  Pulse 93  Wt 228 lb (103.42 kg)  General appearance: alert, no distress, WD/WN, AA female Neck: supple, no lymphadenopathy, no thyromegaly, no masses Heart: RRR, normal S1, S2, no murmurs Lungs: CTA bilaterally, no wheezes, rhonchi, or  rales Abdomen: +bs, soft, tender LLQ, lower abdomen somewhat distended, left flank tender, otherwise non tender, no masses, no hepatomegaly, no splenomegaly Back: tender left low back Pulses: 2+ symmetric, upper and lower extremities, normal cap refill Ext: no edema    Assessment: Encounter Diagnoses  Name Primary?  . Acute urinary retention Yes  . Lower abdominal pain   . Polypharmacy   . Muscle cramp   . High risk medication use      Plan: discussed her concerns.   Given acute urinary retention, will have her go to ED for further eval and management.   We have no means of urine catheterization here which she needs, and likely cause is medication adverse effect, but needs eval to help establish cause.    She is on several psychotropic medication, she is diabetic.  Of note, she just changed psychiatrist recently but denies any new medications since that visit.

## 2015-04-07 NOTE — ED Provider Notes (Signed)
CSN: [id number]     Arrival date & time 04/07/15  1155 History   First MD Initiated Contact with Patient 04/07/15 1501     Chief Complaint  Patient presents with  . Urinary Retention     (Consider location/radiation/quality/duration/timing/severity/associated sxs/prior Treatment) HPI Comments: Patient with a history of COPD, schizophrenia, bipolar disorder, hypertension and diabetes presents with decreased urination and left flank pain. She states for the last 3 days she hasn't urinated at all. However when I further questioned her about this, she has had some urine output after taking a fluid pill but it's very diminished. She's also had a 2 day history of pain in her left flank. It starts in her left mid back and radiates to her left midabdomen. It's been constant and worsening over the last 2 days. She denies any nausea or vomiting. No fevers. No burning on urination or hematuria. No history of similar symptoms in the past. She denies any history of kidney disease.   Past Medical History  Diagnosis Date  . Anxiety   . GERD (gastroesophageal reflux disease)   . Bipolar 1 disorder (HCC)   . Diabetes mellitus 2010  . Hypertension 2010  . Depression   . PTSD (post-traumatic stress disorder)   . Anemia   . Obesity   . Tobacco use   . PONV (postoperative nausea and vomiting)   . Gastritis   . Pneumonia   . Coronary artery disease   . COPD (chronic obstructive pulmonary disease) (HCC)   . Schizophrenia (HCC)   . Headache     migraines  . Alopecia    Past Surgical History  Procedure Laterality Date  . Abdominal hysterectomy    . Leg surgery    . Cholecystectomy    . Tubal ligation    . Cardiac catheterization    . Colonoscopy  05/14/14    hemorrhoids, otherwise normal; Dr. Charlott Rakes  . Fracture surgery      left leg  . Colonoscopy with propofol N/A 05/14/2014    Procedure: COLONOSCOPY WITH PROPOFOL;  Surgeon: Charlott Rakes, MD;  Location: [facility] ENDOSCOPY;  Service:  Endoscopy;  Laterality: N/A;  . Esophagogastroduodenoscopy (egd) with propofol N/A 05/14/2014    Procedure: ESOPHAGOGASTRODUODENOSCOPY (EGD) WITH PROPOFOL;  Surgeon: Charlott Rakes, MD;  Location[facility] ENDOSCOPY;  Service: Endoscopy;  Laterality: N/A;   Family History  Problem Relation Age of Onset  . Diabetes Mother   . Kidney failure Mother   . Diabetes Other   . Cancer Other   . Hypertension Other    Social History  Substance Use Topics  . Smoking status: Light Tobacco Smoker    Last Attempt to Quit: 12/18/2013  . Smokeless tobacco: Never Used  . Alcohol Use: No   OB History    No data available     Review of Systems  Constitutional: Negative for fever, chills, diaphoresis and fatigue.  HENT: Negative for congestion, rhinorrhea and sneezing.   Eyes: Negative.   Respiratory: Negative for cough, chest tightness and shortness of breath.   Cardiovascular: Negative for chest pain and leg swelling.  Gastrointestinal: Positive for abdominal pain. Negative for nausea, vomiting, diarrhea and blood in stool.  Genitourinary: Positive for decreased urine volume. Negative for frequency, hematuria, flank pain and difficulty urinating.  Musculoskeletal: Negative for back pain and arthralgias.  Skin: Negative for rash.  Neurological: Negative for dizziness, speech difficulty, weakness, numbness and headaches.      Allergies  Dexilant; Famotidine; Meperidine hcl; Dilaudid; Gabapentin; Hydrocodone; Ramipril; Ziprasidone  hcl; Invokana; Iohexol; and Tape  Home Medications   Prior to Admission medications   Medication Sig Start Date End Date Taking? Authorizing Provider  albuterol (PROVENTIL HFA;VENTOLIN HFA) 108 (90 BASE) MCG/ACT inhaler Inhale 2 puffs into the lungs every 6 (six) hours as needed for wheezing. For wheezing 11/04/14  Yes Kermit Balo Tysinger, PA-C  amLODipine (NORVASC) 10 MG tablet Take 1 tablet (10 mg total) by mouth daily. 02/10/15  Yes Kermit Balo Tysinger, PA-C  atenolol  (TENORMIN) 50 MG tablet Take 1 tablet (50 mg total) by mouth 2 (two) times daily. 02/10/15  Yes Kermit Balo Tysinger, PA-C  benztropine (COGENTIN) 0.5 MG tablet Take 0.5 mg by mouth 2 (two) times daily.   Yes Historical Provider, MD  busPIRone (BUSPAR) 15 MG tablet Take 15 mg by mouth 2 (two) times daily.   Yes Historical Provider, MD  diphenhydrAMINE (BENADRYL) 25 mg capsule Take 50 mg by mouth at bedtime. Reported on 04/07/2015   Yes Historical Provider, MD  EPINEPHrine (EPIPEN 2-PAK) 0.3 mg/0.3 mL IJ SOAJ injection Inject 0.3 mLs (0.3 mg total) into the muscle once. 03/13/14  Yes Purvis Sheffield, MD  Exenatide ER 2 MG PEN Inject 2 mg into the skin once a week. 03/24/15  Yes Kermit Balo Tysinger, PA-C  ferrous sulfate 325 (65 FE) MG tablet Take 1 tablet (325 mg total) by mouth 2 (two) times daily with a meal. 03/24/15  Yes Kermit Balo Tysinger, PA-C  Fluticasone-Salmeterol (ADVAIR DISKUS) 250-50 MCG/DOSE AEPB Inhale 1 puff into the lungs 2 (two) times daily. 11/04/14  Yes Kermit Balo Tysinger, PA-C  fluvoxaMINE (LUVOX) 25 MG tablet Take 25 mg by mouth 2 (two) times daily.   Yes Historical Provider, MD  furosemide (LASIX) 20 MG tablet Take 1 tablet (20 mg total) by mouth daily. 02/10/15  Yes Kermit Balo Tysinger, PA-C  Insulin Glargine (LANTUS SOLOSTAR) 100 UNIT/ML Solostar Pen Inject 25 Units into the skin daily at 10 pm. 03/24/15  Yes Kermit Balo Tysinger, PA-C  LORazepam (ATIVAN) 0.5 MG tablet Take 0.5 mg by mouth 2 (two) times daily.   Yes Historical Provider, MD  magnesium hydroxide (MILK OF MAGNESIA) 400 MG/5ML suspension Take 15 mLs by mouth daily as needed for mild constipation.   Yes Historical Provider, MD  nitroGLYCERIN (NITROSTAT) 0.4 MG SL tablet Place 0.4 mg under the tongue every 5 (five) minutes as needed for chest pain. Reported on 04/07/2015   Yes Historical Provider, MD  olmesartan (BENICAR) 40 MG tablet Take 1 tablet (40 mg total) by mouth daily. 02/10/15  Yes Kermit Balo Tysinger, PA-C  pantoprazole (PROTONIX) 40 MG  tablet take 1 tablet by mouth twice a day 03/03/15  Yes Kermit Balo Tysinger, PA-C  polyethylene glycol (MIRALAX / GLYCOLAX) packet Take 17 g by mouth daily. Patient taking differently: Take 17 g by mouth once a week.  08/31/14  Yes Fayrene Helper, PA-C  rosuvastatin (CRESTOR) 10 MG tablet Take 1 tablet (10 mg total) by mouth daily. 03/24/15  Yes Kermit Balo Tysinger, PA-C  sitaGLIPtin-metformin (JANUMET) 50-1000 MG tablet take 1 tablet by mouth twice a day WITH A MEAL 03/24/15  Yes Kermit Balo Tysinger, PA-C  sucralfate (CARAFATE) 1 G tablet take 1 tablet by mouth four times a day 15 MINUTES before meals and at bedtime 01/12/15  Yes Kermit Balo Tysinger, PA-C  Insulin Degludec (TRESIBA FLEXTOUCH) 100 UNIT/ML SOPN Inject 20 Units into the skin at bedtime.    Historical Provider, MD   BP 130/79 mmHg  Pulse 90  Temp(Src) 98 F (36.7 C) (Oral)  Resp 18  SpO2 98% Physical Exam  Constitutional: She is oriented to person, place, and time. She appears well-developed and well-nourished.  HENT:  Head: Normocephalic and atraumatic.  Eyes: Pupils are equal, round, and reactive to light.  Neck: Normal range of motion. Neck supple.  Cardiovascular: Normal rate, regular rhythm and normal heart sounds.   Pulmonary/Chest: Effort normal and breath sounds normal. No respiratory distress. She has no wheezes. She has no rales. She exhibits no tenderness.  Abdominal: Soft. Bowel sounds are normal. There is tenderness (positive tenderness in the left CVA area and left midabdomen.). There is no rebound and no guarding.  Musculoskeletal: Normal range of motion. She exhibits no edema.  Lymphadenopathy:    She has no cervical adenopathy.  Neurological: She is alert and oriented to person, place, and time.  Skin: Skin is warm and dry. No rash noted.  Psychiatric: She has a normal mood and affect.    ED Course  Procedures (including critical care time) Labs Review Results for orders placed or performed during the hospital encounter of  04/07/15  Lipase, blood  Result Value Ref Range   Lipase 31 11 - 51 U/L  Comprehensive metabolic panel  Result Value Ref Range   Sodium 140 135 - 145 mmol/L   Potassium 4.2 3.5 - 5.1 mmol/L   Chloride 107 101 - 111 mmol/L   CO2 24 22 - 32 mmol/L   Glucose, Bld 119 (H) 65 - 99 mg/dL   BUN 7 6 - 20 mg/dL   Creatinine, Ser 1.61 0.44 - 1.00 mg/dL   Calcium 8.7 (L) 8.9 - 10.3 mg/dL   Total Protein 7.3 6.5 - 8.1 g/dL   Albumin 3.6 3.5 - 5.0 g/dL   AST 16 15 - 41 U/L   ALT 14 14 - 54 U/L   Alkaline Phosphatase 86 38 - 126 U/L   Total Bilirubin 0.4 0.3 - 1.2 mg/dL   GFR calc non Af Amer >60 >60 mL/min   GFR calc Af Amer >60 >60 mL/min   Anion gap 9 5 - 15  CBC  Result Value Ref Range   WBC 9.9 4.0 - 10.5 K/uL   RBC 4.00 3.87 - 5.11 MIL/uL   Hemoglobin 10.6 (L) 12.0 - 15.0 g/dL   HCT 09.6 (L) 04.5 - 40.9 %   MCV 82.8 78.0 - 100.0 fL   MCH 26.5 26.0 - 34.0 pg   MCHC 32.0 30.0 - 36.0 g/dL   RDW 81.1 (H) 91.4 - 78.2 %   Platelets 388 150 - 400 K/uL  Urinalysis, Routine w reflex microscopic (not at [facility])  Result Value Ref Range   Color, Urine YELLOW YELLOW   APPearance CLOUDY (A) CLEAR   Specific Gravity, Urine 1.014 1.005 - 1.030   pH 6.0 5.0 - 8.0   Glucose, UA NEGATIVE NEGATIVE mg/dL   Hgb urine dipstick SMALL (A) NEGATIVE   Bilirubin Urine NEGATIVE NEGATIVE   Ketones, ur NEGATIVE NEGATIVE mg/dL   Protein, ur NEGATIVE NEGATIVE mg/dL   Nitrite NEGATIVE NEGATIVE   Leukocytes, UA NEGATIVE NEGATIVE  Urine microscopic-add on  Result Value Ref Range   Squamous Epithelial / LPF 0-5 (A) NONE SEEN   WBC, UA 0-5 0 - 5 WBC/hpf   RBC / HPF 0-5 0 - 5 RBC/hpf   Bacteria, UA FEW (A) NONE SEEN   Urine-Other MUCOUS PRESENT    Ct Renal Stone Study  04/07/2015  CLINICAL DATA:  Urinary retention.  LEFT flank pain. EXAM: CT ABDOMEN AND PELVIS WITHOUT CONTRAST TECHNIQUE: Multidetector CT imaging of the abdomen and pelvis was performed following the standard protocol without IV contrast.  COMPARISON:  08/30/2014. FINDINGS: No intrarenal or proximal ureteral calculi on either side. No evidence of hydronephrosis or other secondary signs of upper urinary tract obstruction. Within limits of unenhanced technique, normal appearing RIGHT kidney. Mild scarring in the lateral aspect of the LEFT kidney is stable. Again, within limits of unenhanced technique, remaining visualized upper abdomen unremarkable. Visualized extreme lung bases clear. Cholecystectomy clips. Moderate stool burden. No distal ureteral calculi on either side. No appendiceal inflammation is evident. Pelvic phleboliths. Previous LEFT femoral surgical procedure. IMPRESSION: No evidence for obstructive uropathy, renal or ureteral calculi, or bladder distention. Similar appearance to priors. Electronically Signed   By: Elsie Stain M.D.   On: 04/07/2015 16:22      Imaging Review Ct Renal Stone Study  04/07/2015  CLINICAL DATA:  Urinary retention.  LEFT flank pain. EXAM: CT ABDOMEN AND PELVIS WITHOUT CONTRAST TECHNIQUE: Multidetector CT imaging of the abdomen and pelvis was performed following the standard protocol without IV contrast. COMPARISON:  08/30/2014. FINDINGS: No intrarenal or proximal ureteral calculi on either side. No evidence of hydronephrosis or other secondary signs of upper urinary tract obstruction. Within limits of unenhanced technique, normal appearing RIGHT kidney. Mild scarring in the lateral aspect of the LEFT kidney is stable. Again, within limits of unenhanced technique, remaining visualized upper abdomen unremarkable. Visualized extreme lung bases clear. Cholecystectomy clips. Moderate stool burden. No distal ureteral calculi on either side. No appendiceal inflammation is evident. Pelvic phleboliths. Previous LEFT femoral surgical procedure. IMPRESSION: No evidence for obstructive uropathy, renal or ureteral calculi, or bladder distention. Similar appearance to priors. Electronically Signed   By: Elsie Stain  M.D.   On: 04/07/2015 16:22   I have personally reviewed and evaluated these images and lab results as part of my medical decision-making.   EKG Interpretation None      MDM   Final diagnoses:  Dysuria    Patient presents with decreased urination. She also has associated left flank pain. Her creatinine is normal. She has no evidence of fluid overload. She was given by mouth fluids and was able to urinate. Her urine does not appear infected. There is no evidence of a kidney stone. She was going to be discharged home but left prior to discharge.    Rolan Bucco, MD 04/07/15 [phone]

## 2015-04-07 NOTE — ED Notes (Signed)
Bladder scanner at bedside with cath kit

## 2015-04-09 ENCOUNTER — Telehealth: Payer: Self-pay | Admitting: Family Medicine

## 2015-04-09 ENCOUNTER — Telehealth: Payer: Self-pay

## 2015-04-09 NOTE — Telephone Encounter (Signed)
Pt said she went to ER and they did ultrasound and her bladder was empty.  They had her keep drinking water.  She finally peed today at 4:30.  She is now horse and she is having trouble breathing and she is having sharpe pains from her back to the front of her stomach.  I advised her that I had talked with Vincenza Hews and he had been waiting to hear back from [facility] today and he never did.  I advised her to go on over to the Urgent Care at the [facility] parking lot and let them check her out.  She said she could not drive but she would have one of her kids take her over there.  Again she wanted to apologize to Vincenza Hews that she couldn't stay at the hospital yesterday.

## 2015-04-09 NOTE — Telephone Encounter (Signed)
Saying she still is not having output. Said she has only done two drop, and was told that wasn't enough in the hospital. [place] Spaniel that the doctors had everything ready for her, and where nice. Said that the dr said that they would cath her and the ladies in the back would not cath her just gave her water. Said no one was checking on her so she got up and left because she felt like no one cared about her or to take care of her. Has taken a fluid pill since she left and is feeling out of breath said that it feels like heavyness in her chest. Continued to tell me how upset they made her feel and she is sorry she walked out and left but stated they where so rude to her and made her feel that she was not being cared for and that was dangerous to her. Wants to apologize and told me 5 times to tell Vincenza Hews how sorry she is for walking out. Still having left back pain

## 2015-04-10 NOTE — Telephone Encounter (Signed)
Pt states that she still had not voided since she spoke with Vincenza Hews this morning. Vincenza Hews stated no reason to go to urology because the u/s mentioned her bladder was not full. Explained to her that Vincenza Hews believes that the benztropine which will take a few days to get out of her system. If she gets to where she is in pain, then Vincenza Hews said go back to ED. Pt is aware to take lasix if she notices swelling in feet or legs. Told pt to drink water to keep hydrating and try to refrain from sodas, since she told me she tried that to make her urinate. Pt is aware to call us and come in Monday if feels worse and knows if she needs to be catheterized to see ED because we do not do that in our office

## 2015-04-10 NOTE — Telephone Encounter (Signed)
I called her this morning 04/10/15 at 8:10am.  She has urinated some last night and this morning .  I had her stop Lasix and Benztropine for now.  She will increase water intake and call back by lunch time today to give me an update on symptoms.

## 2015-04-13 ENCOUNTER — Telehealth: Payer: Self-pay | Admitting: Medical

## 2015-04-13 NOTE — Telephone Encounter (Signed)
She did not go to the ED. She said she has been "peeing up a storm" Now she is completely agreeing with Vincenza Hews about the medication being the cause.

## 2015-04-13 NOTE — Telephone Encounter (Signed)
Pt is aware and appt made  

## 2015-04-13 NOTE — Telephone Encounter (Signed)
pls call and see how she is doing this morning.  I spoke to her Friday afternoon and she was going to go to the ED for shortness of breath and ongoing problems with urination.   If no improvement, have her come in today if she didn't go to the ED.   FYI - we held her benztropine and continued her Lasix. All other medications were kept unchanged.

## 2015-04-13 NOTE — Telephone Encounter (Signed)
Plan a 1 wk f/u, sooner though if shortness of breath

## 2015-04-20 ENCOUNTER — Encounter: Payer: Self-pay | Admitting: Medical

## 2015-04-20 ENCOUNTER — Ambulatory Visit (INDEPENDENT_AMBULATORY_CARE_PROVIDER_SITE_OTHER): Payer: Medicare Other | Admitting: Medical

## 2015-04-20 VITALS — BP 140/88 | HR 88 | Wt 230.0 lb

## 2015-04-20 DIAGNOSIS — R0602 Shortness of breath: Secondary | ICD-10-CM | POA: Diagnosis not present

## 2015-04-20 DIAGNOSIS — I10 Essential (primary) hypertension: Secondary | ICD-10-CM

## 2015-04-20 DIAGNOSIS — E1140 Type 2 diabetes mellitus with diabetic neuropathy, unspecified: Secondary | ICD-10-CM

## 2015-04-20 DIAGNOSIS — Z794 Long term (current) use of insulin: Secondary | ICD-10-CM

## 2015-04-20 DIAGNOSIS — E1165 Type 2 diabetes mellitus with hyperglycemia: Secondary | ICD-10-CM

## 2015-04-20 DIAGNOSIS — R339 Retention of urine, unspecified: Secondary | ICD-10-CM

## 2015-04-20 DIAGNOSIS — IMO0002 Reserved for concepts with insufficient information to code with codable children: Secondary | ICD-10-CM

## 2015-04-20 DIAGNOSIS — Z79899 Other long term (current) drug therapy: Secondary | ICD-10-CM

## 2015-04-20 LAB — POCT URINALYSIS DIPSTICK
BILIRUBIN UA: NEGATIVE
GLUCOSE UA: NEGATIVE
KETONES UA: NEGATIVE
Leukocytes, UA: NEGATIVE
NITRITE UA: NEGATIVE
PH UA: 7
Protein, UA: NEGATIVE
RBC UA: NEGATIVE
SPEC GRAV UA: 1.02
Urobilinogen, UA: NEGATIVE

## 2015-04-20 LAB — BASIC METABOLIC PANEL
BUN: 10 mg/dL (ref 7–25)
CALCIUM: 9.1 mg/dL (ref 8.6–10.4)
CO2: 28 mmol/L (ref 20–31)
CREATININE: 0.78 mg/dL (ref 0.50–1.05)
Chloride: 102 mmol/L (ref 98–110)
GLUCOSE: 151 mg/dL — AB (ref 65–99)
Potassium: 4.4 mmol/L (ref 3.5–5.3)
Sodium: 136 mmol/L (ref 135–146)

## 2015-04-20 LAB — BRAIN NATRIURETIC PEPTIDE: BRAIN NATRIURETIC PEPTIDE: 29.1 pg/mL (ref <100)

## 2015-04-20 NOTE — Progress Notes (Signed)
Subjective: Chief Complaint  Patient presents with  . Follow-up    has not urinated in two days again and sharp pains in her back on the left side. pt states another doctor told her that the medication from before is not the cause. she said she sees no swelling. said she feels "full" and can not lay flat. thinks she has fluid in her lungs. said her aide handles her medication and she does not know what she is or is not taking. pt has a taste in her mouth "nasty taste"   Here for f/u on urinary hesitancy.  I have seen her recently for same.  After last visit she went to the ED, but reportedly has non distended bladder on ultrasound, no cath was done.  It seemed as if her medications, particularly benztropine could be the culprit.  She stopped this when we talked to her by phone recently and this did help, but she has still had some days with no urination and then days of lots of urination and incontinence.  She notes feeling full, can't lay flat, worried about fluid in her lungs.   She had c/o SOB on recent call back.  She does note ongoing SOB, orthopnea.   BMs are fine.  Last urination was yesterday just drips here and there.  Last time she has good amount of urine was last Friday, 3 days ago.  Saw psychiatrist recently, and they told her to cut down on water and just using pop sickles and ice chips.  Sugars have been running high.  Her aid helps with her medications so she can't tell me exactly what she is taking today including insulin units.  Last sexually active 2- 3 years ago, s/p hysterectomy, sees gynecology. No concern for UTI or STD.  No other aggravating or relieving factors. No other complaint.  Past Medical History  Diagnosis Date  . Anxiety   . GERD (gastroesophageal reflux disease)   . Bipolar 1 disorder (HCC)   . Diabetes mellitus 2010  . Hypertension 2010  . Depression   . PTSD (post-traumatic stress disorder)   . Anemia   . Obesity   . Tobacco use   . PONV (postoperative nausea  and vomiting)   . Gastritis   . Pneumonia   . Coronary artery disease   . COPD (chronic obstructive pulmonary disease) (HCC)   . Schizophrenia (HCC)   . Headache     migraines  . Alopecia    Current Outpatient Prescriptions on File Prior to Visit  Medication Sig Dispense Refill  . albuterol (PROVENTIL HFA;VENTOLIN HFA) 108 (90 BASE) MCG/ACT inhaler Inhale 2 puffs into the lungs every 6 (six) hours as needed for wheezing. For wheezing 18 g 0  . amLODipine (NORVASC) 10 MG tablet Take 1 tablet (10 mg total) by mouth daily. 90 tablet 1  . atenolol (TENORMIN) 50 MG tablet Take 1 tablet (50 mg total) by mouth 2 (two) times daily. 180 tablet 1  . diphenhydrAMINE (BENADRYL) 25 mg capsule Take 50 mg by mouth at bedtime. Reported on 04/07/2015    . EPINEPHrine (EPIPEN 2-PAK) 0.3 mg/0.3 mL IJ SOAJ injection Inject 0.3 mLs (0.3 mg total) into the muscle once. 1 Device 2  . Exenatide ER 2 MG PEN Inject 2 mg into the skin once a week. 4 each 5  . ferrous sulfate 325 (65 FE) MG tablet Take 1 tablet (325 mg total) by mouth 2 (two) times daily with a meal. 180 tablet 0  .  Fluticasone-Salmeterol (ADVAIR DISKUS) 250-50 MCG/DOSE AEPB Inhale 1 puff into the lungs 2 (two) times daily. 60 each 5  . fluvoxaMINE (LUVOX) 25 MG tablet Take 25 mg by mouth 2 (two) times daily.    . furosemide (LASIX) 20 MG tablet Take 1 tablet (20 mg total) by mouth daily. 90 tablet 1  . Insulin Degludec (TRESIBA FLEXTOUCH) 100 UNIT/ML SOPN Inject 20 Units into the skin at bedtime.    . Insulin Glargine (LANTUS SOLOSTAR) 100 UNIT/ML Solostar Pen Inject 25 Units into the skin daily at 10 pm. 5 pen 5  . LORazepam (ATIVAN) 0.5 MG tablet Take 0.5 mg by mouth 2 (two) times daily.    . magnesium hydroxide (MILK OF MAGNESIA) 400 MG/5ML suspension Take 15 mLs by mouth daily as needed for mild constipation.    . nitroGLYCERIN (NITROSTAT) 0.4 MG SL tablet Place 0.4 mg under the tongue every 5 (five) minutes as needed for chest pain. Reported on  04/07/2015    . olmesartan (BENICAR) 40 MG tablet Take 1 tablet (40 mg total) by mouth daily. 90 tablet 1  . pantoprazole (PROTONIX) 40 MG tablet take 1 tablet by mouth twice a day 40 tablet 3  . polyethylene glycol (MIRALAX / GLYCOLAX) packet Take 17 g by mouth daily. (Patient taking differently: Take 17 g by mouth once a week. ) 14 each 0  . rosuvastatin (CRESTOR) 10 MG tablet Take 1 tablet (10 mg total) by mouth daily. 90 tablet 1  . sitaGLIPtin-metformin (JANUMET) 50-1000 MG tablet take 1 tablet by mouth twice a day WITH A MEAL 180 tablet 1  . sucralfate (CARAFATE) 1 G tablet take 1 tablet by mouth four times a day 15 MINUTES before meals and at bedtime 120 tablet 4  . benztropine (COGENTIN) 0.5 MG tablet Take 0.5 mg by mouth 2 (two) times daily. Reported on 04/20/2015    . busPIRone (BUSPAR) 15 MG tablet Take 15 mg by mouth 2 (two) times daily. Reported on 04/20/2015     No current facility-administered medications on file prior to visit.    ROS as in subjective   Objective: BP 140/88 mmHg  Pulse 88  Wt 230 lb (104.327 kg)  Wt Readings from Last 3 Encounters:  04/20/15 230 lb (104.327 kg)  04/07/15 228 lb (103.42 kg)  03/23/15 222 lb (100.699 kg)   General appearance: alert, no distress, WD/WN, +weight gain since last visit Oral cavity: MMM, no lesions Neck: supple, no lymphadenopathy, no thyromegaly, no masses, no JVD Heart: RRR, normal S1, S2, no murmurs Lungs: CTA bilaterally, no wheezes, rhonchi, or rales Abdomen: +bs, soft, mild generalized tenderness, otherwise non distended, no masses, no hepatomegaly, no splenomegaly Pulses: 1+ symmetric, upper and lower extremities, normal cap refill Ext: no edema GU: declines exam    Adult ECG Report  Indication: SOB  Rate: 83 bpm  Rhythm: normal sinus rhythm  QRS Axis: 11 degrees  PR Interval:  QRS Duration: 74ms  QTc:  Conduction Disturbances: none  Other Abnormalities: none  Patient's cardiac risk factors are:  diabetes mellitus, dyslipidemia, hypertension and obesity (BMI >= 30 kg/m2).  EKG comparison: 08/2014  Narrative Interpretation: no acute changes  CT 04/07/15 CT stone protocol  IMPRESSION: No evidence for obstructive uropathy, renal or ureteral calculi, or bladder distention. Similar appearance to priors.   Assessment: Encounter Diagnoses  Name Primary?  . Urinary retention Yes  . SOB (shortness of breath)   . High risk medication use   . Essential hypertension   .  Uncontrolled type 2 diabetes mellitus with diabetic neuropathy, with long-term current use of insulin (HCC)      Plan: UA and EKG reviewed.  STAT labs for BMET, BNP.    Reviewed recent ED report, CT urogram report and prior 08/2014 CT abdomen /pelvis  Discussed case with supervising physician.  Discussed possible differential.   Psychiatric concern vs neuropahty vs other bladder issue vs other.     Complicating the issue is she can be a poor historian.   At prior visits in the past several months she didn't report Cogentin as a medication but today says she has been taking this for years.   At this point etiology is not clear.  F/u pending labs.

## 2015-04-21 DIAGNOSIS — R338 Other retention of urine: Secondary | ICD-10-CM | POA: Diagnosis not present

## 2015-04-21 DIAGNOSIS — R3911 Hesitancy of micturition: Secondary | ICD-10-CM | POA: Diagnosis not present

## 2015-04-30 ENCOUNTER — Telehealth: Payer: Self-pay | Admitting: Medical

## 2015-04-30 NOTE — Telephone Encounter (Signed)
Pt called told Shauna that her sugars are running 332 and she is doing 28 units not 27 units. Pt hung up before i could talk to her.

## 2015-04-30 NOTE — Telephone Encounter (Signed)
Make sure she is also taking the Janumet.   Increase the night time insulin to 30 units for a week, then 32 units, and f/u here in 2 wk with sugar readings.

## 2015-04-30 NOTE — Telephone Encounter (Signed)
Pt called at states that her sugars are running high, states that the one this morning, 332 states that she is taking her medicine like she is suppose to and eating right, i looked at the lab notes and it said to take 27 units and she said she was taking 28 units so im not for sure if she is taking more than she is suppose to, i put her on hold to talk to courtney to clarify things to but i guess Eron hung up. Toni Amend is going to send you a note to, i was just worried about her also. Said her sugars have been running in the 300 hundreds for a couple of days, please advise pt.

## 2015-04-30 NOTE — Telephone Encounter (Signed)
Called pt she was not taking janumet twice a day. Will start today. Bumping up insulin. Has appt 05/14/15

## 2015-05-04 ENCOUNTER — Telehealth: Payer: Self-pay | Admitting: Medical

## 2015-05-04 NOTE — Telephone Encounter (Signed)
Pt got refill on 02/10/2015 for #180 with one refill she is not needing a refill at this time

## 2015-05-04 NOTE — Telephone Encounter (Signed)
Rcvd refill request for Atenolol 50mg  #180

## 2015-05-11 ENCOUNTER — Other Ambulatory Visit: Payer: Self-pay | Admitting: Medical

## 2015-05-14 ENCOUNTER — Ambulatory Visit: Payer: Self-pay | Admitting: Medical

## 2015-05-19 ENCOUNTER — Telehealth: Payer: Self-pay | Admitting: Medical

## 2015-05-19 MED ORDER — ATENOLOL 50 MG PO TABS: 50.0000 mg | ORAL_TABLET | Freq: Two times a day (BID) | ORAL | Status: DC

## 2015-05-19 NOTE — Addendum Note (Signed)
Addended by: Kieth BrightlyLAWSON, Anjelo Pullman M on: 05/19/2015 12:30 PM   Modules accepted: Orders

## 2015-05-19 NOTE — Telephone Encounter (Signed)
Pt is aware, is stopping the medication for now. Asked about her next appt which isnt due for a month. Is only taking Xanax and Remus Lofflerambien ( i think that is what she was trying to say ) stopped all other mental medications.

## 2015-05-19 NOTE — Telephone Encounter (Signed)
I recommend she take what her psychiatrist tells her to take!

## 2015-05-19 NOTE — Telephone Encounter (Signed)
[facility].   Make sure she is no longer taking Sucralfate.  This was meant to be short term use with prior stomach pains.  She can c/t Pantoprazole for reflux, but she should not be taking Sucralfate currently.

## 2015-05-19 NOTE — Telephone Encounter (Signed)
Pt is aware refilled her atenolol and made her 3 month f/up appt

## 2015-05-19 NOTE — Telephone Encounter (Signed)
[facility].  She should not be taking Sucralfate currently.  This is typically a short term medication.  So stop this, but she can c/t Pantoprazole for acid reflux.

## 2015-05-27 ENCOUNTER — Telehealth: Payer: Self-pay | Admitting: Medical

## 2015-05-27 DIAGNOSIS — E118 Type 2 diabetes mellitus with unspecified complications: Principal | ICD-10-CM

## 2015-05-27 DIAGNOSIS — E1165 Type 2 diabetes mellitus with hyperglycemia: Secondary | ICD-10-CM

## 2015-05-27 NOTE — Telephone Encounter (Signed)
1- Pt requesting that her script for test strips be updated at the pharmacy because she need a refill now but pharmacy has that she only tests once a day but she test 3 times a day per [facility] instruction.    2 - Pt is struggling to keep blood sugar down. It has been high. This morning it was 296. She said she has been eating properly. Pt increased the amount of Lantus she was taking to 26 units from 25 units to see if that would help but it did not. Pt has an appt on 4/17 and she wants to come in sooner to address this but her daughter is expected to deliver her baby any day now so she wants to wait until after the delivery . What can pt do to help decrease blood sugar until she get in here for appt? Can Janumet be altered?  3 - Pt wants [facility] to know that her mental meds have been altered and it is working. She thanks [facility] Hews[facility]ane for this. She is still on Xanax & Ambien but an injection was added and pt can not remember the name of that med

## 2015-05-27 NOTE — Telephone Encounter (Signed)
Her sugars have been hard to control.   Call out test strips for TID testing given that she takes insulin and lets refer to endocrinology for better control of her sugars.

## 2015-05-28 MED ORDER — GLUCOSE BLOOD VI STRP: 1.0000 | ORAL_STRIP | Freq: Three times a day (TID) | Status: DC

## 2015-05-28 NOTE — Telephone Encounter (Signed)
Referral in and refilled test strips once called pt to verify the meter she uses

## 2015-06-15 ENCOUNTER — Encounter: Payer: Self-pay | Admitting: Medical

## 2015-06-15 ENCOUNTER — Ambulatory Visit (INDEPENDENT_AMBULATORY_CARE_PROVIDER_SITE_OTHER): Payer: Medicare Other | Admitting: Medical

## 2015-06-15 VITALS — BP 120/86 | HR 84 | Wt 226.0 lb

## 2015-06-15 DIAGNOSIS — E1140 Type 2 diabetes mellitus with diabetic neuropathy, unspecified: Secondary | ICD-10-CM

## 2015-06-15 DIAGNOSIS — R252 Cramp and spasm: Secondary | ICD-10-CM

## 2015-06-15 DIAGNOSIS — D509 Iron deficiency anemia, unspecified: Secondary | ICD-10-CM | POA: Insufficient documentation

## 2015-06-15 DIAGNOSIS — Z789 Other specified health status: Secondary | ICD-10-CM

## 2015-06-15 DIAGNOSIS — IMO0002 Reserved for concepts with insufficient information to code with codable children: Secondary | ICD-10-CM

## 2015-06-15 DIAGNOSIS — Z2821 Immunization not carried out because of patient refusal: Secondary | ICD-10-CM | POA: Diagnosis not present

## 2015-06-15 DIAGNOSIS — F17200 Nicotine dependence, unspecified, uncomplicated: Secondary | ICD-10-CM

## 2015-06-15 DIAGNOSIS — E1165 Type 2 diabetes mellitus with hyperglycemia: Secondary | ICD-10-CM | POA: Diagnosis not present

## 2015-06-15 DIAGNOSIS — Z794 Long term (current) use of insulin: Secondary | ICD-10-CM

## 2015-06-15 DIAGNOSIS — E785 Hyperlipidemia, unspecified: Secondary | ICD-10-CM | POA: Diagnosis not present

## 2015-06-15 DIAGNOSIS — F250 Schizoaffective disorder, bipolar type: Secondary | ICD-10-CM | POA: Diagnosis not present

## 2015-06-15 DIAGNOSIS — I10 Essential (primary) hypertension: Secondary | ICD-10-CM | POA: Diagnosis not present

## 2015-06-15 DIAGNOSIS — F4310 Post-traumatic stress disorder, unspecified: Secondary | ICD-10-CM | POA: Diagnosis not present

## 2015-06-15 LAB — IRON AND TIBC
%SAT: 11 % (ref 11–50)
Iron: 38 ug/dL — ABNORMAL LOW (ref 45–160)
TIBC: 341 ug/dL (ref 250–450)
UIBC: 303 ug/dL (ref 125–400)

## 2015-06-15 LAB — CBC WITH DIFFERENTIAL/PLATELET
BASOS ABS: 0 {cells}/uL (ref 0–200)
BASOS PCT: 0 %
EOS ABS: 98 {cells}/uL (ref 15–500)
Eosinophils Relative: 1 %
HEMATOCRIT: 36.4 % (ref 35.0–45.0)
Hemoglobin: 11.8 g/dL (ref 11.7–15.5)
Lymphocytes Relative: 33 %
Lymphs Abs: 3234 cells/uL (ref 850–3900)
MCH: 26.9 pg — AB (ref 27.0–33.0)
MCHC: 32.4 g/dL (ref 32.0–36.0)
MCV: 83.1 fL (ref 80.0–100.0)
MONO ABS: 392 {cells}/uL (ref 200–950)
MONOS PCT: 4 %
MPV: 9.1 fL (ref 7.5–12.5)
NEUTROS ABS: 6076 {cells}/uL (ref 1500–7800)
Neutrophils Relative %: 62 %
PLATELETS: 427 10*3/uL — AB (ref 140–400)
RBC: 4.38 MIL/uL (ref 3.80–5.10)
RDW: 15.2 % — ABNORMAL HIGH (ref 11.0–15.0)
WBC: 9.8 10*3/uL (ref 4.0–10.5)

## 2015-06-15 LAB — POCT GLYCOSYLATED HEMOGLOBIN (HGB A1C): Hemoglobin A1C: 9.2

## 2015-06-15 MED ORDER — OLMESARTAN MEDOXOMIL 40 MG PO TABS: 40.0000 mg | ORAL_TABLET | Freq: Every day | ORAL | Status: DC

## 2015-06-15 MED ORDER — ALBUTEROL SULFATE HFA 108 (90 BASE) MCG/ACT IN AERS: 2.0000 | INHALATION_SPRAY | Freq: Four times a day (QID) | RESPIRATORY_TRACT | Status: DC | PRN

## 2015-06-15 MED ORDER — EXENATIDE ER 2 MG ~~LOC~~ PEN: 2.0000 mg | PEN_INJECTOR | SUBCUTANEOUS | Status: DC

## 2015-06-15 MED ORDER — METFORMIN HCL ER 500 MG PO TB24: ORAL_TABLET | ORAL | Status: DC

## 2015-06-15 MED ORDER — ROSUVASTATIN CALCIUM 10 MG PO TABS: 10.0000 mg | ORAL_TABLET | Freq: Every day | ORAL | Status: DC

## 2015-06-15 MED ORDER — FLUTICASONE-SALMETEROL 250-50 MCG/DOSE IN AEPB: 1.0000 | INHALATION_SPRAY | Freq: Two times a day (BID) | RESPIRATORY_TRACT | Status: DC

## 2015-06-15 MED ORDER — FUROSEMIDE 20 MG PO TABS: 20.0000 mg | ORAL_TABLET | Freq: Every day | ORAL | Status: DC

## 2015-06-15 MED ORDER — INSULIN GLARGINE 100 UNIT/ML SOLOSTAR PEN: 35.0000 [IU] | PEN_INJECTOR | Freq: Every day | SUBCUTANEOUS | Status: DC

## 2015-06-15 MED ORDER — ATENOLOL 50 MG PO TABS: 50.0000 mg | ORAL_TABLET | Freq: Two times a day (BID) | ORAL | Status: DC

## 2015-06-15 MED ORDER — AMLODIPINE BESYLATE 10 MG PO TABS: 10.0000 mg | ORAL_TABLET | Freq: Every day | ORAL | Status: DC

## 2015-06-15 NOTE — Patient Instructions (Addendum)
Recommendations:  Blood pressure:  Continue Amlodipine 10mg  daily in the morning  Continue Atenolol 50mg  twice daily  Continue Benicar 40mg  daily in the morning  Continue Lasix 20mg  daily in the morning  Diabetes   STOP Janumet  Change to Metformin/Glucophage XR once daily  Continue Bydureon weekly injection every Tuesday  Increase Lantus to 30 units nightly  Increase to Lantus 35 units nightly in 1 week if morning sugars still >130  Increase to Lantus 40 units nightly in 2 weeks if morning sugars still >130  High cholesterol  Continue Crestor 30mg  daily at bedtime  COPD   Continue Advair 1 puff twice daily  Continue Albuterol inhaler as needed for shortness of breath

## 2015-06-15 NOTE — Addendum Note (Signed)
Addended by: Kieth BrightlyLAWSON, Lua Feng M on: 06/15/2015 11:54 AM   Modules accepted: Orders

## 2015-06-15 NOTE — Progress Notes (Signed)
Subjective: Chief Complaint  Patient presents with  . Follow-up    on iron and sugars. lowest 180 highest is 260. has been using her insulin and medicines. said she is eating healthy. still having her feet and hands cramping up on her. pt is unaware of what she is taking because her aid gets it is also getting a shot every month but does not know what the shot is   Here for f/u.  Still has nurse aid coming to house, and someone helps her with her medications daily.    Diabetes - checking glucose, lowest recent sugar has been 180.   Current regiment includes  Compliant with Janumet 50/1000mg  BID, Lantus 27 u QHS.  She notes pharmacy wouldn't refill the Exenatide 2mg  weekly injection for the past month  Compliant with BP medications and statin without c/o.     Taking iron BID.    She c/o some muscle cramps, and wants recheck labs on iron, sugar.  Has schizoaffective disorder, PTSD, seeing psychiatry, compliant with medications.  Past Medical History  Diagnosis Date  . Anxiety   . GERD (gastroesophageal reflux disease)   . Bipolar 1 disorder (HCC)   . Diabetes mellitus 2010  . Hypertension 2010  . Depression   . PTSD (post-traumatic stress disorder)   . Anemia   . Obesity   . Tobacco use   . PONV (postoperative nausea and vomiting)   . Gastritis   . Pneumonia   . Coronary artery disease   . COPD (chronic obstructive pulmonary disease) (HCC)   . Schizophrenia (HCC)   . Headache     migraines  . Alopecia    Current Outpatient Prescriptions on File Prior to Visit  Medication Sig Dispense Refill  . amLODipine (NORVASC) 10 MG tablet Take 1 tablet (10 mg total) by mouth daily. 90 tablet 1  . atenolol (TENORMIN) 50 MG tablet Take 1 tablet (50 mg total) by mouth 2 (two) times daily. 180 tablet 1  . benztropine (COGENTIN) 0.5 MG tablet Take 0.5 mg by mouth 2 (two) times daily. Reported on 04/20/2015    . busPIRone (BUSPAR) 15 MG tablet Take 15 mg by mouth 2 (two) times daily.  Reported on 04/20/2015    . diphenhydrAMINE (BENADRYL) 25 mg capsule Take 50 mg by mouth at bedtime. Reported on 04/07/2015    . Exenatide ER 2 MG PEN Inject 2 mg into the skin once a week. 4 each 5  . ferrous sulfate 325 (65 FE) MG tablet Take 1 tablet (325 mg total) by mouth 2 (two) times daily with a meal. 180 tablet 0  . Fluticasone-Salmeterol (ADVAIR DISKUS) 250-50 MCG/DOSE AEPB Inhale 1 puff into the lungs 2 (two) times daily. 60 each 5  . fluvoxaMINE (LUVOX) 25 MG tablet Take 25 mg by mouth 2 (two) times daily.    . furosemide (LASIX) 20 MG tablet Take 1 tablet (20 mg total) by mouth daily. 90 tablet 1  . glucose blood test strip 1 each by Other route 3 (three) times daily. Use as instructed 100 each 5  . Insulin Degludec (TRESIBA FLEXTOUCH) 100 UNIT/ML SOPN Inject 20 Units into the skin at bedtime.    . Insulin Glargine (LANTUS SOLOSTAR) 100 UNIT/ML Solostar Pen Inject 25 Units into the skin daily at 10 pm. 5 pen 5  . LORazepam (ATIVAN) 0.5 MG tablet Take 0.5 mg by mouth 2 (two) times daily.    . magnesium hydroxide (MILK OF MAGNESIA) 400 MG/5ML suspension Take 15 mLs by  mouth daily as needed for mild constipation.    . nitroGLYCERIN (NITROSTAT) 0.4 MG SL tablet Place 0.4 mg under the tongue every 5 (five) minutes as needed for chest pain. Reported on 04/07/2015    . olmesartan (BENICAR) 40 MG tablet Take 1 tablet (40 mg total) by mouth daily. 90 tablet 1  . pantoprazole (PROTONIX) 40 MG tablet take 1 tablet by mouth twice a day 40 tablet 3  . polyethylene glycol (MIRALAX / GLYCOLAX) packet Take 17 g by mouth daily. (Patient taking differently: Take 17 g by mouth once a week. ) 14 each 0  . rosuvastatin (CRESTOR) 10 MG tablet Take 1 tablet (10 mg total) by mouth daily. 90 tablet 1  . sitaGLIPtin-metformin (JANUMET) 50-1000 MG tablet take 1 tablet by mouth twice a day WITH A MEAL 180 tablet 1  . sucralfate (CARAFATE) 1 G tablet take 1 tablet by mouth four times a day 15 MINUTES before meals and  at bedtime 120 tablet 4  . albuterol (PROVENTIL HFA;VENTOLIN HFA) 108 (90 BASE) MCG/ACT inhaler Inhale 2 puffs into the lungs every 6 (six) hours as needed for wheezing. For wheezing (Patient not taking: Reported on 06/15/2015) 18 g 0  . EPINEPHrine (EPIPEN 2-PAK) 0.3 mg/0.3 mL IJ SOAJ injection Inject 0.3 mLs (0.3 mg total) into the muscle once. (Patient not taking: Reported on 06/15/2015) 1 Device 2   No current facility-administered medications on file prior to visit.    ROS as in subjective  Objective: BP 120/86 mmHg  Pulse 84  Wt 226 lb (102.513 kg)  Gen: wd, wn, nad Skin unremarkable Heart RRR, normal s1, s2, no murmurs Lungs clear Ext: no edema Pulses normal Psych: seems her usual self, pleasant, but always seems a bit guarded    Assessment: Encounter Diagnoses  Name Primary?  Marland Kitchen Uncontrolled type 2 diabetes mellitus with diabetic neuropathy, with long-term current use of insulin (HCC)   . Essential hypertension   . Decreased activities of daily living (ADL) Yes  . Schizoaffective disorder, bipolar type (HCC)   . TOBACCO ABUSE   . POST TRAUMATIC STRESS SYNDROME   . Anemia, iron deficiency   . Hyperlipidemia     Plan: Diabetes - HgbA1C >9 % today.   C/t daily foot checks, yearly eye visits with ophthalmology.  hyperlipidemia - c/t statin   Hypertension - controlled on current medication  Anemia - c/t iron, labs today  C/t routine f/u with psychiatry.   Sees [facility].  Her mental illness is a major limiting factor with her overall health and ability to take care of her self.  C/t with nurse aid coming to house to help with ADLs  She will likely not quit tobacco anytime soon.  Regarding cancer screening - she is up to date on mammogram, colonoscopy, pap, sees gyn.  Pneumovax - declines vaccine  Patient instructions printed today:   Blood pressure:  Continue Amlodipine  daily in the morning  Continue Atenolol  twice daily  Continue Benicar   daily in the morning  Continue Lasix  daily in the morning  Diabetes   STOP Janumet  Change to Metformin/Glucophage XR once daily  Continue Bydureon weekly injection every Tuesday  Increase Lantus to 30 units nightly  Increase to Lantus 35 units nightly in 1 week if morning sugars still >130  Increase to Lantus 40 units nightly in 2 weeks if morning sugars still >130  High cholesterol  Continue Crestor  daily at bedtime  COPD   Continue Advair  1 puff twice daily  Continue Albuterol inhaler as needed for shortness of breath     Angelle was seen today for follow-up.  Diagnoses and all orders for this visit:  Decreased activities of daily living (ADL)  Uncontrolled type 2 diabetes mellitus with diabetic neuropathy, with long-term current use of insulin (HCC)  Essential hypertension  Schizoaffective disorder, bipolar type (HCC)  TOBACCO ABUSE  POST TRAUMATIC STRESS SYNDROME  Anemia, iron deficiency -     CBC with Differential/Platelet -     Iron and TIBC  Hyperlipidemia

## 2015-06-16 LAB — BASIC METABOLIC PANEL
BUN: 9 mg/dL (ref 7–25)
CALCIUM: 9.2 mg/dL (ref 8.6–10.4)
CO2: 20 mmol/L (ref 20–31)
CREATININE: 0.85 mg/dL (ref 0.50–1.05)
Chloride: 103 mmol/L (ref 98–110)
GLUCOSE: 118 mg/dL — AB (ref 65–99)
Potassium: 4.2 mmol/L (ref 3.5–5.3)
SODIUM: 135 mmol/L (ref 135–146)

## 2015-06-16 LAB — MAGNESIUM: MAGNESIUM: 1.3 mg/dL — AB (ref 1.5–2.5)

## 2015-06-17 ENCOUNTER — Other Ambulatory Visit: Payer: Self-pay | Admitting: Medical

## 2015-06-17 MED ORDER — MAGNESIUM GLUCONATE 500 MG PO TABS: 500.0000 mg | ORAL_TABLET | Freq: Every day | ORAL | Status: DC

## 2015-06-17 MED ORDER — FERROUS SULFATE 325 (65 FE) MG PO TABS: 325.0000 mg | ORAL_TABLET | Freq: Two times a day (BID) | ORAL | Status: DC

## 2015-06-26 ENCOUNTER — Telehealth: Payer: Self-pay | Admitting: Medical

## 2015-06-26 ENCOUNTER — Encounter: Payer: Self-pay | Admitting: Medical

## 2015-06-26 ENCOUNTER — Ambulatory Visit (INDEPENDENT_AMBULATORY_CARE_PROVIDER_SITE_OTHER): Payer: Medicare Other | Admitting: Medical

## 2015-06-26 VITALS — BP 142/82 | HR 84 | Resp 18 | Ht 64.0 in | Wt 235.0 lb

## 2015-06-26 DIAGNOSIS — Z794 Long term (current) use of insulin: Secondary | ICD-10-CM | POA: Diagnosis not present

## 2015-06-26 DIAGNOSIS — F411 Generalized anxiety disorder: Secondary | ICD-10-CM

## 2015-06-26 DIAGNOSIS — R252 Cramp and spasm: Secondary | ICD-10-CM | POA: Diagnosis not present

## 2015-06-26 DIAGNOSIS — Z659 Problem related to unspecified psychosocial circumstances: Secondary | ICD-10-CM | POA: Diagnosis not present

## 2015-06-26 DIAGNOSIS — I10 Essential (primary) hypertension: Secondary | ICD-10-CM | POA: Diagnosis not present

## 2015-06-26 DIAGNOSIS — R0602 Shortness of breath: Secondary | ICD-10-CM

## 2015-06-26 DIAGNOSIS — R0601 Orthopnea: Secondary | ICD-10-CM

## 2015-06-26 DIAGNOSIS — E1140 Type 2 diabetes mellitus with diabetic neuropathy, unspecified: Secondary | ICD-10-CM | POA: Diagnosis not present

## 2015-06-26 DIAGNOSIS — Z789 Other specified health status: Secondary | ICD-10-CM | POA: Diagnosis not present

## 2015-06-26 DIAGNOSIS — R739 Hyperglycemia, unspecified: Secondary | ICD-10-CM

## 2015-06-26 DIAGNOSIS — F250 Schizoaffective disorder, bipolar type: Secondary | ICD-10-CM

## 2015-06-26 DIAGNOSIS — R419 Unspecified symptoms and signs involving cognitive functions and awareness: Secondary | ICD-10-CM

## 2015-06-26 DIAGNOSIS — E1165 Type 2 diabetes mellitus with hyperglycemia: Secondary | ICD-10-CM

## 2015-06-26 DIAGNOSIS — R7309 Other abnormal glucose: Secondary | ICD-10-CM | POA: Diagnosis not present

## 2015-06-26 DIAGNOSIS — F259 Schizoaffective disorder, unspecified: Secondary | ICD-10-CM | POA: Diagnosis not present

## 2015-06-26 DIAGNOSIS — IMO0002 Reserved for concepts with insufficient information to code with codable children: Secondary | ICD-10-CM

## 2015-06-26 LAB — CBC
HEMATOCRIT: 34.9 % — AB (ref 35.0–45.0)
Hemoglobin: 11.2 g/dL — ABNORMAL LOW (ref 11.7–15.5)
MCH: 26.2 pg — ABNORMAL LOW (ref 27.0–33.0)
MCHC: 32.1 g/dL (ref 32.0–36.0)
MCV: 81.7 fL (ref 80.0–100.0)
MPV: 9.9 fL (ref 7.5–12.5)
PLATELETS: 372 10*3/uL (ref 140–400)
RBC: 4.27 MIL/uL (ref 3.80–5.10)
RDW: 15 % (ref 11.0–15.0)
WBC: 9.2 10*3/uL (ref 4.0–10.5)

## 2015-06-26 LAB — BASIC METABOLIC PANEL
BUN: 7 mg/dL (ref 7–25)
CHLORIDE: 99 mmol/L (ref 98–110)
CO2: 22 mmol/L (ref 20–31)
Calcium: 8.8 mg/dL (ref 8.6–10.4)
Creat: 0.71 mg/dL (ref 0.50–1.05)
Glucose, Bld: 338 mg/dL — ABNORMAL HIGH (ref 65–99)
POTASSIUM: 4.1 mmol/L (ref 3.5–5.3)
SODIUM: 133 mmol/L — AB (ref 135–146)

## 2015-06-26 LAB — MAGNESIUM: Magnesium: 1.5 mg/dL (ref 1.5–2.5)

## 2015-06-26 LAB — GLUCOSE, POCT (MANUAL RESULT ENTRY): POC GLUCOSE: 447 mg/dL — AB (ref 70–99)

## 2015-06-26 NOTE — Progress Notes (Signed)
Subjective: Chief Complaint  Patient presents with  . Hypertension    blood pressure was a little bit elevated and her sugar was 370 (last meal cereal @ 9:00am). Just doesn't feel right. Has a bad headache -back of her head.Took 30 unit of Lantus at 1:30pm.   Here for not feeling well.   [name] has several health problems including uncontrolled diabetes, hypertension, anemia, hyperlipidemia, tobacco use, and all this is complicated by schizoaffective disorder, severe anxiety, and difficult with ADLs and managing her medications.   She is here today for sugars running high in the last 48 hours from 300s yesterday to almost 500 today.  She feels weak, palms sweaty, having urinary frequency and polydipsia, blurred vision, headache and she feels like she is having generalized swelling.  She has also been having cramps in her legs.   She has worse SOB and some chest pain in the past week.   She notes taking Nitroglycerin this past week for chest pain, this gave her a massive headache and she felt worse.  She notes she can't lie flat due to SOB.     In recent moths she has been adamant about not having her independence taken away.  She fears being put in a home or taken out of her house.  She has compensated by having home health aide come in to help with ADLs.   The aide has been helping her somewhat with her medications to make sure she is taking her medications daily, but ultimately [name] is still in charge of making sure she is compliant with her medications .  She seems to be somewhat compliant but its really hard to say.   Sometimes when asked about her medications she is unsure, but other times seems to have a good handle on her medications.     In recent weeks we made attempts to refer to endocrinology but we have had a hard time finding an office to accept her insurance.  Thus, she has not seen endocrinology yet.     From last visit we asked her to increase Lantus to 30 units, then in 1  week go up to 35u, however, she has continued at 30 unites daily.   She did stop Janumet and changed to Metformin.   Medicine list reviewed today and she states she is compliant with everything else.    Past Medical History  Diagnosis Date  . Anxiety   . GERD (gastroesophageal reflux disease)   . Bipolar 1 disorder (HCC)   . Diabetes mellitus 2010  . Hypertension 2010  . Depression   . PTSD (post-traumatic stress disorder)   . Anemia   . Obesity   . Tobacco use   . PONV (postoperative nausea and vomiting)   . Gastritis   . Pneumonia   . Coronary artery disease   . COPD (chronic obstructive pulmonary disease) (HCC)   . Schizophrenia (HCC)   . Headache     migraines  . Alopecia    Current Outpatient Prescriptions on File Prior to Visit  Medication Sig Dispense Refill  . albuterol (PROVENTIL HFA;VENTOLIN HFA) 108 (90 Base) MCG/ACT inhaler Inhale 2 puffs into the lungs every 6 (six) hours as needed for wheezing. For wheezing 18 g 1  . amLODipine (NORVASC) 10 MG tablet Take 1 tablet (10 mg total) by mouth daily. 90 tablet 3  . atenolol (TENORMIN) 50 MG tablet Take 1 tablet (50 mg total) by mouth 2 (two) times daily. 180 tablet 3  .  diphenhydrAMINE (BENADRYL) 25 mg capsule Take 50 mg by mouth at bedtime. Reported on 04/07/2015    . Exenatide ER 2 MG PEN Inject 2 mg into the skin once a week. 4 each 5  . ferrous sulfate 325 (65 FE) MG tablet Take 1 tablet (325 mg total) by mouth 2 (two) times daily with a meal. 180 tablet 0  . Fluticasone-Salmeterol (ADVAIR DISKUS) 250-50 MCG/DOSE AEPB Inhale 1 puff into the lungs 2 (two) times daily. 60 each 5  . fluvoxaMINE (LUVOX) 25 MG tablet Take 25 mg by mouth 2 (two) times daily.    . furosemide (LASIX) 20 MG tablet Take 1 tablet (20 mg total) by mouth daily. 90 tablet 3  . glucose blood test strip 1 each by Other route 3 (three) times daily. Use as instructed 100 each 5  . Insulin Glargine (LANTUS SOLOSTAR) 100 UNIT/ML Solostar Pen Inject 35 Units  into the skin daily at 10 pm. 5 pen 5  . metFORMIN (GLUCOPHAGE XR) 500 MG 24 hr tablet 2 tablets po daily 180 tablet 3  . olmesartan (BENICAR) 40 MG tablet Take 1 tablet (40 mg total) by mouth daily. 90 tablet 3  . pantoprazole (PROTONIX) 40 MG tablet take 1 tablet by mouth twice a day 40 tablet 3  . polyethylene glycol (MIRALAX / GLYCOLAX) packet Take 17 g by mouth daily. (Patient taking differently: Take 17 g by mouth once a week. ) 14 each 0  . rosuvastatin (CRESTOR) 10 MG tablet Take 1 tablet (10 mg total) by mouth daily. 90 tablet 1  . benztropine (COGENTIN) 0.5 MG tablet Take 0.5 mg by mouth 2 (two) times daily. Reported on 04/20/2015    . busPIRone (BUSPAR) 15 MG tablet Take 15 mg by mouth 2 (two) times daily. Reported on 04/20/2015    . EPINEPHrine (EPIPEN 2-PAK) 0.3 mg/0.3 mL IJ SOAJ injection Inject 0.3 mLs (0.3 mg total) into the muscle once. (Patient not taking: Reported on 06/15/2015) 1 Device 2  . nitroGLYCERIN (NITROSTAT) 0.4 MG SL tablet Place 0.4 mg under the tongue every 5 (five) minutes as needed for chest pain. Reported on 06/26/2015     No current facility-administered medications on file prior to visit.   ROS as in subjective   Objective: BP 142/82 mmHg  Pulse 84  Resp 18  Ht  (1.626 m)  Wt 235 lb (106.595 kg)  BMI 40.32 kg/m2  General appearance: alert, no distress, WD/WN, obese AA lying on exam table Palms slightly diaphoretic Seems dyspneic with coming back from the restroom, but otherwise ok Oral cavity: MMM, no lesions Neck: supple, no lymphadenopathy, no thyromegaly, no masses, no obvious JVD Heart: RRR, normal S1, S2, no murmurs Lungs: CTA bilaterally, no wheezes, rhonchi, or rales Ext: no obvious edema Pulses:1+ symmetric, upper and lower extremities, normal cap refill Psych: seems anxious, legs with nervous motion, and she seems to be more worried appearing than usual   Adult ECG Report  Indication: SOB  Rate: 82 bpm  Rhythm: normal sinus rhythm   QRS Axis: 10 degrees  PR Interval:  QRS Duration: 72ms  QTc:  Conduction Disturbances: none  Other Abnormalities: Q in III, poor R wave progression  Patient's cardiac risk factors are: diabetes mellitus, dyslipidemia, hypertension, obesity (BMI >= 30 kg/m2) and sedentary lifestyle.smoker  EKG comparison: 04/2015  Narrative Interpretation: no change compared to 04/2015 EKG     Assessment: Encounter Diagnoses  Name Primary?  . Elevated blood sugar level Yes  . Uncontrolled  type 2 diabetes mellitus with diabetic neuropathy, with long-term current use of insulin (HCC)   . Essential hypertension   . Schizoaffective disorder, bipolar type (HCC)   . Decreased activities of daily living (ADL)   . Cognitive safety issue   . LEG CRAMPS   . Generalized anxiety disorder   . Orthopnea   . SOB (shortness of breath)   . Hypomagnesemia      Plan: Her blood sugar here today was 447 initially, then a little later was in the high 300s.   Discussed possible causes of symptoms.  Of note, since she has been getting the injectable antipsychotic from her psychiatrist, she has had weight gain which may be the factor contributing to her hyperglycemia and weight gain today.    Recommended eval in the ED but she declines.   Will get STAT labs, EKG, UA.  I added meal time Humalog 5 units TID, increase Lantus to 35u QHS, c/t rest of medications as usual and I will call this evening once I have results   Addendum - called patient Friday evening at about 8:45pm with results.  BNP not back yet.  At that time she was feeling ok, had done the Humalog mealtime and was getting ready to do her 35 u Lantus.  Answered her questions, and we will call with BNP result.  [place] was seen today for hypertension.  Diagnoses and all orders for this visit:  Elevated blood sugar level -     Glucose (CBG) -     EKG 12-Lead -     Basic metabolic panel -     CBC -     Brain natriuretic peptide -     Magnesium -      POCT urinalysis dipstick  Uncontrolled type 2 diabetes mellitus with diabetic neuropathy, with long-term current use of insulin (HCC) -     Basic metabolic panel -     CBC -     Brain natriuretic peptide -     Magnesium -     POCT urinalysis dipstick  Essential hypertension -     EKG 12-Lead -     Basic metabolic panel -     CBC -     Brain natriuretic peptide -     Magnesium -     POCT urinalysis dipstick  Schizoaffective disorder, bipolar type (HCC) -     Basic metabolic panel -     CBC -     Brain natriuretic peptide -     Magnesium -     POCT urinalysis dipstick  Decreased activities of daily living (ADL) -     Basic metabolic panel -     CBC -     Brain natriuretic peptide -     Magnesium -     POCT urinalysis dipstick  Cognitive safety issue -     Basic metabolic panel -     CBC -     Brain natriuretic peptide -     Magnesium -     POCT urinalysis dipstick  LEG CRAMPS -     Basic metabolic panel -     CBC -     Brain natriuretic peptide -     Magnesium -     POCT urinalysis dipstick  Generalized anxiety disorder -     Basic metabolic panel -     CBC -     Brain natriuretic peptide -     Magnesium -  POCT urinalysis dipstick  Orthopnea -     Basic metabolic panel -     CBC -     Brain natriuretic peptide -     Magnesium -     POCT urinalysis dipstick  SOB (shortness of breath) -     EKG 12-Lead -     Basic metabolic panel -     CBC -     Brain natriuretic peptide -     Magnesium -     POCT urinalysis dipstick  Hypomagnesemia -     Basic metabolic panel -     CBC -     Brain natriuretic peptide -     Magnesium -     POCT urinalysis dipstick  Other orders -     insulin lispro (HUMALOG) 100 UNIT/ML injection; Inject 0.05 mLs (5 Units total) into the skin 3 (three) times daily before meals.    Spent > 45 minutes face to face with patient in discussion of symptoms, evaluation, plan and recommendations.

## 2015-06-26 NOTE — Telephone Encounter (Signed)
[facility]  Review at next visit

## 2015-06-29 MED ORDER — INSULIN LISPRO 100 UNIT/ML ~~LOC~~ SOLN: 5.0000 [IU] | Freq: Three times a day (TID) | SUBCUTANEOUS | Status: DC

## 2015-07-03 ENCOUNTER — Telehealth: Payer: Self-pay

## 2015-07-03 NOTE — Telephone Encounter (Signed)
Pt is aware and will call back to make appt concerning knots.

## 2015-07-03 NOTE — Telephone Encounter (Signed)
She can bump up to 40 u night time insulin, and can go up to 8 u meal time insulin as long as she is checking sugars before meals and not seeing numbers 80 or less.  We want to avoid low readings .    The plan is still to get her into endocrinology ASAP  Not sure about the knot she is referring to.  We can look at this here first.   Make appt if needed, if concern for abscess, then needs to be seen this weekend at urgent care.

## 2015-07-03 NOTE — Telephone Encounter (Signed)
Pt called and left me a voicemail yesterday afternoon. States her sugar was 318 this morning, and yesterday morning it was 270. Checked her sugar while I was on the phone and it was 300. Ate grits this morning with butter and salt & pepper. Pt had slurred speech while talking to me.  Is using 30 units of Lantus and 10 units of humalog. She also has a knot reappearing in her stomach and wants a referral back to Dr Milus HeightSpooler?

## 2015-07-15 ENCOUNTER — Ambulatory Visit (INDEPENDENT_AMBULATORY_CARE_PROVIDER_SITE_OTHER): Payer: Medicare Other | Admitting: Medical

## 2015-07-15 ENCOUNTER — Encounter: Payer: Self-pay | Admitting: Medical

## 2015-07-15 VITALS — BP 128/90 | HR 83 | Wt 237.0 lb

## 2015-07-15 DIAGNOSIS — IMO0002 Reserved for concepts with insufficient information to code with codable children: Secondary | ICD-10-CM

## 2015-07-15 DIAGNOSIS — R300 Dysuria: Secondary | ICD-10-CM

## 2015-07-15 DIAGNOSIS — Z794 Long term (current) use of insulin: Secondary | ICD-10-CM

## 2015-07-15 DIAGNOSIS — R3915 Urgency of urination: Secondary | ICD-10-CM | POA: Diagnosis not present

## 2015-07-15 DIAGNOSIS — E1165 Type 2 diabetes mellitus with hyperglycemia: Secondary | ICD-10-CM | POA: Diagnosis not present

## 2015-07-15 DIAGNOSIS — R198 Other specified symptoms and signs involving the digestive system and abdomen: Secondary | ICD-10-CM

## 2015-07-15 DIAGNOSIS — Z720 Tobacco use: Secondary | ICD-10-CM

## 2015-07-15 DIAGNOSIS — E1140 Type 2 diabetes mellitus with diabetic neuropathy, unspecified: Secondary | ICD-10-CM

## 2015-07-15 DIAGNOSIS — R635 Abnormal weight gain: Secondary | ICD-10-CM | POA: Diagnosis not present

## 2015-07-15 DIAGNOSIS — R1013 Epigastric pain: Secondary | ICD-10-CM

## 2015-07-15 DIAGNOSIS — R0602 Shortness of breath: Secondary | ICD-10-CM | POA: Diagnosis not present

## 2015-07-15 DIAGNOSIS — Z9889 Other specified postprocedural states: Secondary | ICD-10-CM | POA: Diagnosis not present

## 2015-07-15 DIAGNOSIS — F17200 Nicotine dependence, unspecified, uncomplicated: Secondary | ICD-10-CM

## 2015-07-15 LAB — POCT URINALYSIS DIPSTICK
BILIRUBIN UA: NEGATIVE
Glucose, UA: NEGATIVE
KETONES UA: NEGATIVE
LEUKOCYTES UA: NEGATIVE
Nitrite, UA: NEGATIVE
Urobilinogen, UA: NEGATIVE
pH, UA: 6

## 2015-07-15 NOTE — Progress Notes (Addendum)
Subjective: Chief Complaint  Patient presents with  . Knot on stomach    it is in upper part of her abdomen, said she feels like her stomach is swollen. said she is not eating, monday she sees endocrinology. said her urine has a bad odor.    [name]"NeeSee" is an AA female with hx/o significant for diabetes, bipolar disorder, anxiety, PTSD, depression, HTN, obesity, GERD, COPD reportedly, and difficulty with ADLs, here for several concerns.   Felt it this past week, seems that it is getting larger, hurting in this area.  Seems to bulge out.   Worse when standing.   Not sure if she has been told she has hernia or not.  Does have gastric ulcer.   Is taking GERD medication.   Has seen GI, Dr. Bosie Alvarez prior, has appt with him soon. Pain doesn't seem to be related to eating, but not having much of appetite currently.  Drinks a lot of water. Thinks her psychiatric medications are making her gain weight too.  Urine smells a little funny of late.  Having some urgency and frequency.   Sees psychiatry Friday.  Doesn't want to do the injectable medication.     Staying SOB, ongoing.   Sees cardiology, but she still smokes and had gained weight, likely due to psychiatric medications vs insulin.    She sees endocrinology for the first time next Monday.  Sugars still running high but she is using the regimen we discussed last visit. Lantus, taking 38 u QHS.  Using Humalog 10u per meals, BID    Past Medical History  Diagnosis Date  . Anxiety   . GERD (gastroesophageal reflux disease)   . Bipolar 1 disorder (HCC)   . Diabetes mellitus 2010  . Hypertension 2010  . Depression   . PTSD (post-traumatic stress disorder)   . Anemia   . Obesity   . Tobacco use   . PONV (postoperative nausea and vomiting)   . Gastritis   . Pneumonia   . Coronary artery disease   . COPD (chronic obstructive pulmonary disease) (HCC)   . Schizophrenia (HCC)   . Headache     migraines  . Alopecia    Current Outpatient  Prescriptions on File Prior to Visit  Medication Sig Dispense Refill  . albuterol (PROVENTIL HFA;VENTOLIN HFA) 108 (90 Base) MCG/ACT inhaler Inhale 2 puffs into the lungs every 6 (six) hours as needed for wheezing. For wheezing 18 g 1  . ALPRAZolam (XANAX) 0.5 MG tablet Take 0.5 mg by mouth 3 (three) times daily.  0  . amLODipine (NORVASC) 10 MG tablet Take 1 tablet (10 mg total) by mouth daily. 90 tablet 3  . atenolol (TENORMIN) 50 MG tablet Take 1 tablet (50 mg total) by mouth 2 (two) times daily. 180 tablet 3  . benztropine (COGENTIN) 0.5 MG tablet Take 0.5 mg by mouth 2 (two) times daily. Reported on 04/20/2015    . busPIRone (BUSPAR) 15 MG tablet Take 15 mg by mouth 2 (two) times daily. Reported on 04/20/2015    . diphenhydrAMINE (BENADRYL) 25 mg capsule Take 50 mg by mouth at bedtime. Reported on 04/07/2015    . EPINEPHrine (EPIPEN 2-PAK) 0.3 mg/0.3 mL IJ SOAJ injection Inject 0.3 mLs (0.3 mg total) into the muscle once. 1 Device 2  . Exenatide ER 2 MG PEN Inject 2 mg into the skin once a week. 4 each 5  . FANAPT 6 MG TABS   0  . ferrous sulfate 325 (65 FE)  MG tablet Take 1 tablet (325 mg total) by mouth 2 (two) times daily with a meal. 180 tablet 0  . Fluticasone-Salmeterol (ADVAIR DISKUS) 250-50 MCG/DOSE AEPB Inhale 1 puff into the lungs 2 (two) times daily. 60 each 5  . fluvoxaMINE (LUVOX) 25 MG tablet Take 25 mg by mouth 2 (two) times daily.    . furosemide (LASIX) 20 MG tablet Take 1 tablet (20 mg total) by mouth daily. 90 tablet 3  . glucose blood test strip 1 each by Other route 3 (three) times daily. Use as instructed 100 each 5  . Insulin Glargine (LANTUS SOLOSTAR) 100 UNIT/ML Solostar Pen Inject 35 Units into the skin daily at 10 pm. 5 pen 5  . insulin lispro (HUMALOG) 100 UNIT/ML injection Inject 0.05 mLs (5 Units total) into the skin 3 (three) times daily before meals. 10 mL 0  . metFORMIN (GLUCOPHAGE XR) 500 MG 24 hr tablet 2 tablets po daily 180 tablet 3  . olmesartan (BENICAR) 40  MG tablet Take 1 tablet (40 mg total) by mouth daily. 90 tablet 3  . pantoprazole (PROTONIX) 40 MG tablet take 1 tablet by mouth twice a day 40 tablet 3  . polyethylene glycol (MIRALAX / GLYCOLAX) packet Take 17 g by mouth daily. (Patient taking differently: Take 17 g by mouth once a week. ) 14 each 0  . QUEtiapine (SEROQUEL XR) 50 MG TB24 24 hr tablet   0  . rosuvastatin (CRESTOR) 10 MG tablet Take 1 tablet (10 mg total) by mouth daily. 90 tablet 1  . zolpidem (AMBIEN) 5 MG tablet take 1 tablet by mouth at bedtime if needed for sleep  0  . nitroGLYCERIN (NITROSTAT) 0.4 MG SL tablet Place 0.4 mg under the tongue every 5 (five) minutes as needed for chest pain. Reported on 07/15/2015     No current facility-administered medications on file prior to visit.     Past Surgical History  Procedure Laterality Date  . Abdominal hysterectomy    . Leg surgery    . Cholecystectomy    . Tubal ligation    . Cardiac catheterization    . Colonoscopy  05/14/14    hemorrhoids, otherwise normal; Dr. Charlott Rakes  . Fracture surgery      left leg  . Colonoscopy with propofol N/A 05/14/2014    Procedure: COLONOSCOPY WITH PROPOFOL;  Surgeon: Charlott Rakes, MD;  Location: [facility] ENDOSCOPY;  Service: Endoscopy;  Laterality: N/A;  . Esophagogastroduodenoscopy (egd) with propofol N/A 05/14/2014    Procedure: ESOPHAGOGASTRODUODENOSCOPY (EGD) WITH PROPOFOL;  Surgeon: Charlott Rakes, MD;  Location: [facility] ENDOSCOPY;  Service: Endoscopy;  Laterality: N/A;   ROS as in subjective  Objective: BP 128/90 mmHg  Pulse 83  Wt 237 lb (107.502 kg)  Wt Readings from Last 3 Encounters:  07/15/15 237 lb (107.502 kg)  06/26/15 235 lb (106.595 kg)  06/15/15 226 lb (102.513 kg)   Gen: wd, wn, nad, obese AA female Skin unremarkable Lungs clear Heart RRR, normal s1, s2, no murmurs Abdomen: +bs, soft, large RUQ surgical scar diagonally.  There is a fullness in the epigastric region mainly when increasing abdominal pressure  with valsalva or standing.  Not really noticeable when lying supine.   No other mass, no other organomegaly Back: nontender Ext: no edema Pulses normal   Assessment: Encounter Diagnoses  Name Primary?  . Epigastric pain Yes  . History of abdominal surgery   . Weight gain   . Abdominal fullness   . SOB (shortness of breath)   .  Smoker   . Uncontrolled type 2 diabetes mellitus with diabetic neuropathy, with long-term current use of insulin (HCC)     Plan: discussed her symptoms, exam findings.   Regarding abdominal pain and fullness, possible hernia vs mass/fullness vs ulcer/inflammation.    C/t PPI, Protonix, avoid GERD triggers.  We will pursue imaging.   SOB - after reviewing her methods and seeing what medication she took out of her pocket book, she is using Symbicort and Advair interchangeably as rescue inhalers.   Advised proper use of these medications, advised she use Symbicort 2 puffs BID for prevention, keep this at home, and when she runs out of this can change to Advair.   discussed proper use and indication for albuterol rescue inhaler.  She will work on proper use.   BNP lab today.  Baseline PFT done today. Seem normal, although her symptoms and hx/o would suggest mild asthma vs COPD.  will request cardiology records  Advised smoking cessation  Diabetes - c/t current medications and f/u with new establish consult with endocrinology Monday at my request.

## 2015-07-15 NOTE — Patient Instructions (Signed)
Regarding your inhalers  Use either Symbicort or Advair, but not both simultaneously  For now use Symbicort 2 puffs in the morning, 2 puffs at night, rinse mouth out with water after use  Keep the Albuterol inhaler in your pocket book.  This is the EMERGENCY inhaler to use 2 puffs every 4-6 hours as needed for shortness of breath or wheezing  We will call about the lab and about the abdominal scan for possible hernia.

## 2015-07-15 NOTE — Addendum Note (Signed)
Addended by: Jac CanavanYSINGER, Oliverio Cho S on: 07/15/2015 04:15 PM   Modules accepted: Orders

## 2015-07-16 DIAGNOSIS — E782 Mixed hyperlipidemia: Secondary | ICD-10-CM | POA: Diagnosis not present

## 2015-07-16 DIAGNOSIS — Z79899 Other long term (current) drug therapy: Secondary | ICD-10-CM | POA: Diagnosis not present

## 2015-07-16 DIAGNOSIS — E1165 Type 2 diabetes mellitus with hyperglycemia: Secondary | ICD-10-CM | POA: Diagnosis not present

## 2015-07-16 LAB — URINE CULTURE: Colony Count: 3000

## 2015-07-16 LAB — BRAIN NATRIURETIC PEPTIDE: BRAIN NATRIURETIC PEPTIDE: 17 pg/mL (ref <100)

## 2015-07-17 DIAGNOSIS — F250 Schizoaffective disorder, bipolar type: Secondary | ICD-10-CM | POA: Diagnosis not present

## 2015-07-17 DIAGNOSIS — Z79899 Other long term (current) drug therapy: Secondary | ICD-10-CM | POA: Diagnosis not present

## 2015-07-20 ENCOUNTER — Telehealth: Payer: Self-pay

## 2015-07-20 DIAGNOSIS — Z79899 Other long term (current) drug therapy: Secondary | ICD-10-CM | POA: Diagnosis not present

## 2015-07-20 DIAGNOSIS — I10 Essential (primary) hypertension: Secondary | ICD-10-CM | POA: Diagnosis not present

## 2015-07-20 DIAGNOSIS — E1165 Type 2 diabetes mellitus with hyperglycemia: Secondary | ICD-10-CM | POA: Diagnosis not present

## 2015-07-20 DIAGNOSIS — I2510 Atherosclerotic heart disease of native coronary artery without angina pectoris: Secondary | ICD-10-CM | POA: Diagnosis not present

## 2015-07-20 DIAGNOSIS — Z794 Long term (current) use of insulin: Secondary | ICD-10-CM | POA: Diagnosis not present

## 2015-07-20 DIAGNOSIS — E7800 Pure hypercholesterolemia, unspecified: Secondary | ICD-10-CM | POA: Diagnosis not present

## 2015-07-20 NOTE — Telephone Encounter (Signed)
Pt stated that she went to see the Endocrinologist, said he is very rude and she said that he said a comment to her about her hygiene, stated that he knew she was not being truthful about her walking daily, and said that she was telling them she couldn't use the tape they requested because of the reaction her body has and he didn't seem to care per pt. She said that she had to hold her tongue because he was hurting her feelings. She said that she will do whatever you say needs to be done but she would prefer not to go back to [facility].

## 2015-07-20 NOTE — Telephone Encounter (Signed)
Pt called and LM for me to call her back. LM for her to call me back

## 2015-07-20 NOTE — Telephone Encounter (Signed)
Dr. Georgetta HaberAlzeimers

## 2015-07-20 NOTE — Telephone Encounter (Signed)
Who did she see?

## 2015-07-21 NOTE — Telephone Encounter (Signed)
LM with his CMA

## 2015-07-21 NOTE — Telephone Encounter (Signed)
Please call endocrinology and ask nurse how visit went and advise of patient's perception of the visit.  Not sure how to move forward.  endocrinology needs to be managing her diabetes now.  However, I want to make sure we have a good fit.  Make sure they are aware that she has psychiatric issues.  I assume we sent our last notes and labs, and I am always patient with her as her mental health issues to play a big role in her overall health and ability to take care of herself.

## 2015-07-22 ENCOUNTER — Telehealth: Payer: Self-pay | Admitting: Medical

## 2015-07-22 NOTE — Telephone Encounter (Signed)
Pt is aware and put a block for 15 min after pts already scheduled appt in order to discuss this

## 2015-07-22 NOTE — Telephone Encounter (Signed)
I reviewed the notes from Dr. Leslie DalesAltheimer.   It appears he spent a great deal of time getting to understand her situation.   Not sure how the actual conversation went, but its looks like he did give a thorough evaluation and gave good recommendations.   I'm not sure if maybe she perceived the recommendations inaccurately or not, but I get the impression that a lot of things were discussed, and she may have heard something about hygiene incorrectly.    His notes say that when discussing medication options like Invokana, this could help sugars, but could cause yeast infections, particular if personal hygiene was not good.   So maybe she heard this as her hygiene wasn't good, but this was not the intent.     I strongly recommend she use his recommendations, and make a follow up appt with him.  If she needs to come in and go over things with me first, that is fine (30min visit), but if not, go ahead and reschedule with Dr. Leslie DalesAltheimer

## 2015-07-23 ENCOUNTER — Ambulatory Visit
Admission: RE | Admit: 2015-07-23 | Discharge: 2015-07-23 | Disposition: A | Discharge status: Home / Self Care | Payer: Medicare Other | Source: Ambulatory Visit | Attending: Medical | Admitting: Medical

## 2015-07-23 DIAGNOSIS — K439 Ventral hernia without obstruction or gangrene: Secondary | ICD-10-CM | POA: Diagnosis not present

## 2015-07-23 DIAGNOSIS — R198 Other specified symptoms and signs involving the digestive system and abdomen: Secondary | ICD-10-CM

## 2015-07-23 DIAGNOSIS — Z9889 Other specified postprocedural states: Secondary | ICD-10-CM

## 2015-07-23 DIAGNOSIS — R1013 Epigastric pain: Secondary | ICD-10-CM

## 2015-07-28 ENCOUNTER — Ambulatory Visit: Payer: Medicare Other | Admitting: Medical

## 2015-07-30 ENCOUNTER — Encounter: Payer: Self-pay | Admitting: Medical

## 2015-07-30 ENCOUNTER — Ambulatory Visit (INDEPENDENT_AMBULATORY_CARE_PROVIDER_SITE_OTHER): Payer: Medicare Other | Admitting: Medical

## 2015-07-30 VITALS — BP 130/88 | HR 84 | Wt 233.0 lb

## 2015-07-30 DIAGNOSIS — E1165 Type 2 diabetes mellitus with hyperglycemia: Secondary | ICD-10-CM

## 2015-07-30 DIAGNOSIS — E1140 Type 2 diabetes mellitus with diabetic neuropathy, unspecified: Secondary | ICD-10-CM | POA: Diagnosis not present

## 2015-07-30 DIAGNOSIS — K760 Fatty (change of) liver, not elsewhere classified: Secondary | ICD-10-CM

## 2015-07-30 DIAGNOSIS — I517 Cardiomegaly: Secondary | ICD-10-CM | POA: Diagnosis not present

## 2015-07-30 DIAGNOSIS — F250 Schizoaffective disorder, bipolar type: Secondary | ICD-10-CM | POA: Diagnosis not present

## 2015-07-30 DIAGNOSIS — R3129 Other microscopic hematuria: Secondary | ICD-10-CM | POA: Diagnosis not present

## 2015-07-30 DIAGNOSIS — E669 Obesity, unspecified: Secondary | ICD-10-CM | POA: Diagnosis not present

## 2015-07-30 DIAGNOSIS — K439 Ventral hernia without obstruction or gangrene: Secondary | ICD-10-CM

## 2015-07-30 DIAGNOSIS — Z794 Long term (current) use of insulin: Secondary | ICD-10-CM

## 2015-07-30 DIAGNOSIS — N83201 Unspecified ovarian cyst, right side: Secondary | ICD-10-CM | POA: Diagnosis not present

## 2015-07-30 DIAGNOSIS — R109 Unspecified abdominal pain: Secondary | ICD-10-CM

## 2015-07-30 DIAGNOSIS — IMO0002 Reserved for concepts with insufficient information to code with codable children: Secondary | ICD-10-CM

## 2015-07-30 LAB — POCT UA - MICROSCOPIC ONLY

## 2015-07-30 LAB — POCT URINALYSIS DIPSTICK
Bilirubin, UA: NEGATIVE
GLUCOSE UA: NEGATIVE
Ketones, UA: NEGATIVE
LEUKOCYTES UA: NEGATIVE
NITRITE UA: NEGATIVE
PROTEIN UA: NEGATIVE
SPEC GRAV UA: 1.025
UROBILINOGEN UA: NEGATIVE
pH, UA: 6

## 2015-07-30 NOTE — Patient Instructions (Signed)
Recommendations:  Continue Bydureon weekly injection  You should be using the Metformin XR 500 mg, 2 tablets twice daily for diabetes  Increase to Lantus 35 units nightly.  In 1 week if glucose still over 200, then go to Lantus 40 units nightly  Continue meal time 10 units insulin  Work on losing weight given the diagnosis of fatty liver disease  See your gynecologist about the ovarian cyst

## 2015-07-30 NOTE — Progress Notes (Signed)
Subjective: Chief Complaint  Patient presents with  . Follow-up    c/t scan. need to run a u/a scan   Cheyenne Alvarez"Cheyenne Alvarez" is an AA female with hx/o significant for diabetes, bipolar disorder, anxiety, PTSD, depression, HTN, obesity, GERD, COPD reportedly, and difficulty with ADLs, here for f/u on recent first consult with endocrinology recent, Dr. Leslie DalesAltheimer, and apparently had some issues with the visit.  She is here for f/u on recent CT abdomen pelvis after having abdominal and pelvic pain.  Here for f/u on abnormal urinalysis  Medical team:  Cardiology, Dr. Sharyn LullHarwani GI, Dr. Charlott RakesVincent Schooler Endocrinology, Dr. Kyra SearlesAlzheimer, recently first consult Gynecology, Dr. Francoise CeoBernard Marshall psychiatry Dr. Veronia BeetsHeading TYSINGER, DAVID [facility]urg[facility] LL[facility]ANE, PA-C here for primary care  She notes hx/o bad reaction with yeast infection with Invokana, rash spread to buttocks, upper thighs, was bad, saw her prior PCP with this.    Currently she is taking Lantus 30 u nightly, compliant with Bydureon weekly, taking 10 units meal time insulin.  Checking glucose, no recent numbers over 200.   She does not want to go back to Dr. Leslie DalesAltheimer.  Also thinks the implantable glucose monitor is devil work.  She is a spiritual person and feels God will help her through her illness, and doesn't want implant anything in her body.      From last visit, she was having pain in upper abdomen, bulging, and worried about hernia.  Current she denies any abdominal pain.    Past Medical History  Diagnosis Date  . Anxiety   . GERD (gastroesophageal reflux disease)   . Bipolar 1 disorder (HCC)   . Diabetes mellitus 2010  . Hypertension 2010  . Depression   . PTSD (post-traumatic stress disorder)   . Anemia   . Obesity   . Tobacco use   . PONV (postoperative nausea and vomiting)   . Gastritis   . Pneumonia   . Coronary artery disease   . COPD (chronic obstructive pulmonary disease) (HCC)   . Schizophrenia (HCC)   . Headache     migraines  .  Alopecia      Current Outpatient Prescriptions on File Prior to Visit  Medication Sig Dispense Refill  . ALPRAZolam (XANAX) 0.5 MG tablet Take 0.5 mg by mouth 3 (three) times daily.  0  . amLODipine (NORVASC) 10 MG tablet Take 1 tablet (10 mg total) by mouth daily. 90 tablet 3  . atenolol (TENORMIN) 50 MG tablet Take 1 tablet (50 mg total) by mouth 2 (two) times daily. 180 tablet 3  . benztropine (COGENTIN) 0.5 MG tablet Take 0.5 mg by mouth 2 (two) times daily. Reported on 04/20/2015    . busPIRone (BUSPAR) 15 MG tablet Take 15 mg by mouth 2 (two) times daily. Reported on 04/20/2015    . diphenhydrAMINE (BENADRYL) 25 mg capsule Take 50 mg by mouth at bedtime. Reported on 04/07/2015    . Exenatide ER 2 MG PEN Inject 2 mg into the skin once a week. 4 each 5  . FANAPT 6 MG TABS   0  . ferrous sulfate 325 (65 FE) MG tablet Take 1 tablet (325 mg total) by mouth 2 (two) times daily with a meal. 180 tablet 0  . Fluticasone-Salmeterol (ADVAIR DISKUS) 250-50 MCG/DOSE AEPB Inhale 1 puff into the lungs 2 (two) times daily. 60 each 5  . fluvoxaMINE (LUVOX) 25 MG tablet Take 25 mg by mouth 2 (two) times daily.    . furosemide (LASIX) 20 MG tablet Take 1 tablet (  20 mg total) by mouth daily. 90 tablet 3  . glucose blood test strip 1 each by Other route 3 (three) times daily. Use as instructed 100 each 5  . Insulin Glargine (LANTUS SOLOSTAR) 100 UNIT/ML Solostar Pen Inject 35 Units into the skin daily at 10 pm. 5 pen 5  . insulin lispro (HUMALOG) 100 UNIT/ML injection Inject 0.05 mLs (5 Units total) into the skin 3 (three) times daily before meals. 10 mL 0  . metFORMIN (GLUCOPHAGE XR) 500 MG 24 hr tablet 2 tablets po daily 180 tablet 3  . nitroGLYCERIN (NITROSTAT) 0.4 MG SL tablet Place 0.4 mg under the tongue every 5 (five) minutes as needed for chest pain. Reported on 07/15/2015    . olmesartan (BENICAR) 40 MG tablet Take 1 tablet (40 mg total) by mouth daily. 90 tablet 3  . pantoprazole (PROTONIX) 40 MG  tablet take 1 tablet by mouth twice a day 40 tablet 3  . polyethylene glycol (MIRALAX / GLYCOLAX) packet Take 17 g by mouth daily. (Patient taking differently: Take 17 g by mouth once a week. ) 14 each 0  . QUEtiapine (SEROQUEL XR) 50 MG TB24 24 hr tablet   0  . rosuvastatin (CRESTOR) 10 MG tablet Take 1 tablet (10 mg total) by mouth daily. 90 tablet 1  . zolpidem (AMBIEN) 5 MG tablet take 1 tablet by mouth at bedtime if needed for sleep  0  . albuterol (PROVENTIL HFA;VENTOLIN HFA) 108 (90 Base) MCG/ACT inhaler Inhale 2 puffs into the lungs every 6 (six) hours as needed for wheezing. For wheezing (Patient not taking: Reported on 07/30/2015) 18 g 1  . EPINEPHrine (EPIPEN 2-PAK) 0.3 mg/0.3 mL IJ SOAJ injection Inject 0.3 mLs (0.3 mg total) into the muscle once. (Patient not taking: Reported on 07/30/2015) 1 Device 2   No current facility-administered medications on file prior to visit.     Past Surgical History  Procedure Laterality Date  . Abdominal hysterectomy    . Leg surgery    . Cholecystectomy    . Tubal ligation    . Cardiac catheterization    . Colonoscopy  05/14/14    hemorrhoids, otherwise normal; Dr. Charlott Rakes  . Fracture surgery      left leg  . Colonoscopy with propofol N/A 05/14/2014    Procedure: COLONOSCOPY WITH PROPOFOL;  Surgeon: Charlott Rakes, MD;  Location: [facility] ENDOSCOPY;  Service: Endoscopy;  Laterality: N/A;  . Esophagogastroduodenoscopy (egd) with propofol N/A 05/14/2014    Procedure: ESOPHAGOGASTRODUODENOSCOPY (EGD) WITH PROPOFOL;  Surgeon: Charlott Rakes, MD;  Location: [facility] ENDOSCOPY;  Service: Endoscopy;  Laterality: N/A;   ROS as in subjective  Objective: BP 130/88 mmHg  Pulse 84  Wt 233 lb (105.688 kg)  Gen: wd, wn, nad, obese AA female Skin unremarkable Lungs clear Heart RRR, normal s1, s2, no murmurs Abdomen: +bs, soft, large RUQ surgical scar diagonally.  There is a fullness in the epigastric region mainly when increasing abdominal pressure with  valsalva or standing.  Not really noticeable when lying supine.   No other mass, no other organomegaly, nontender Back: nontender Ext: no edema Pulses normal   Assessment: Encounter Diagnoses  Name Primary?  . Abdominal pain, unspecified abdominal location Yes  . Ventral hernia without obstruction or gangrene   . Fatty liver   . Cyst of right ovary   . Cardiomegaly   . Schizoaffective disorder, bipolar type (HCC)   . OBESITY   . Uncontrolled type 2 diabetes mellitus with diabetic neuropathy, with long-term  current use of insulin (HCC)   . Microscopic hematuria       Plan: abdominal pain - resolved, reviewed recent CT Hernia - not causing problem current, so will leave this alone for now Fatty liver disease - discussed the diagnosis, treatment recommendations, long term complications Cyst of ovary - advised she see gynecology soon about this cardiomegaly - f/u with cardiology Schizoaffective disorder - f/u with psychiatry Obesity - c/t efforts at weight loss Diabetes - I reviewed Dr. Berline Lopes notes, and unfortunately she is not willing to go back there.  I tried to counsel on her concerns. Advised that we need endocrinology involved, and given the limited providers that take Medicare, it may take months to get her into endocrinology.   For now, c/t 10u mealtime insulin, increase to Lantus 35 u QHS x 1wk then 40u QHS.  Change to Metformin ER  2 tablets BID per Dr. Berline Lopes recommendations.  C/t Bydureon, monitoring glucose Microscopic hematuria - false + as slide was unremarkable today without RBCs

## 2015-08-07 DIAGNOSIS — F250 Schizoaffective disorder, bipolar type: Secondary | ICD-10-CM | POA: Diagnosis not present

## 2015-08-07 DIAGNOSIS — Z79899 Other long term (current) drug therapy: Secondary | ICD-10-CM | POA: Diagnosis not present

## 2015-09-02 ENCOUNTER — Ambulatory Visit: Payer: Medicare Other

## 2015-09-14 ENCOUNTER — Other Ambulatory Visit: Payer: Self-pay | Admitting: Medical

## 2015-09-28 ENCOUNTER — Encounter: Payer: Self-pay | Admitting: Obstetrics & Gynecology

## 2015-09-28 ENCOUNTER — Ambulatory Visit (INDEPENDENT_AMBULATORY_CARE_PROVIDER_SITE_OTHER): Payer: Medicare Other | Admitting: Obstetrics & Gynecology

## 2015-09-28 VITALS — BP 136/77 | HR 95 | Temp 100.0°F | Ht 64.0 in | Wt 233.9 lb

## 2015-09-28 DIAGNOSIS — N83201 Unspecified ovarian cyst, right side: Secondary | ICD-10-CM

## 2015-09-28 DIAGNOSIS — N951 Menopausal and female climacteric states: Secondary | ICD-10-CM | POA: Diagnosis not present

## 2015-09-28 DIAGNOSIS — N83209 Unspecified ovarian cyst, unspecified side: Secondary | ICD-10-CM | POA: Insufficient documentation

## 2015-09-28 NOTE — Progress Notes (Signed)
CLINIC ENCOUNTER NOTE  History:  50 y.o. PMP female here today reporting having a R ovarian cyst and also complains of vaginal dryness leading to painful intercourse.  She underwent hysterectomy several years ago for benign indications; just started having menopausal symptoms in last couple of years.  Ovarian cyst was incidentally noted on CT scan for other indications; 4 cm right ovarian cyst.  She denies any abnormal vaginal discharge, bleeding, pelvic pain or other concerns.   Past Medical History:  Diagnosis Date  . Alopecia   . Anemia   . Anxiety   . Bipolar 1 disorder (HCC)   . COPD (chronic obstructive pulmonary disease) (HCC)   . Coronary artery disease   . Depression   . Diabetes mellitus 2010  . Gastritis   . GERD (gastroesophageal reflux disease)   . Headache    migraines  . Hypertension 2010  . Obesity   . Pneumonia   . PONV (postoperative nausea and vomiting)   . PTSD (post-traumatic stress disorder)   . Schizophrenia (HCC)   . Tobacco use     Past Surgical History:  Procedure Laterality Date  . ABDOMINAL HYSTERECTOMY    . CARDIAC CATHETERIZATION    . CHOLECYSTECTOMY    . COLONOSCOPY  05/14/14   hemorrhoids, otherwise normal; Dr. Charlott Rakes  . COLONOSCOPY WITH PROPOFOL N/A 05/14/2014   Procedure: COLONOSCOPY WITH PROPOFOL;  Surgeon: Charlott Rakes, MD;  Location: [facility] ENDOSCOPY;  Service: Endoscopy;  Laterality: N/A;  . ESOPHAGOGASTRODUODENOSCOPY (EGD) WITH PROPOFOL N/A 05/14/2014   Procedure: ESOPHAGOGASTRODUODENOSCOPY (EGD) WITH PROPOFOL;  Surgeon: Charlott Rakes, MD;  Location: [facility] ENDOSCOPY;  Service: Endoscopy;  Laterality: N/A;  . FRACTURE SURGERY     left leg  . LEG SURGERY    . TUBAL LIGATION      The following portions of the patient's history were reviewed and updated as appropriate: allergies, current medications, past family history, past medical history, past social history, past surgical history and problem list.   Health Maintenance:   Normal mammogram last year.   Review of Systems:  Pertinent items noted in HPI and remainder of comprehensive ROS otherwise negative.  Objective:  Physical Exam BP 136/77   Pulse 95   Temp 100 F (37.8 C) (Oral)   Ht 5\' 4"  (1.626 m)   Wt 233 lb 14.4 oz (106.1 kg)   BMI 40.15 kg/m  CONSTITUTIONAL: Well-developed, well-nourished female in no acute distress.  HENT:  Normocephalic, atraumatic. External right and left ear normal. Oropharynx is clear and moist EYES: Conjunctivae and EOM are normal. Pupils are equal, round, and reactive to light. No scleral icterus.  NECK: Normal range of motion, supple, no masses SKIN: Skin is warm and dry. No rash noted. Not diaphoretic. No erythema. No pallor. NEUROLOGIC: Alert and oriented to person, place, and time. Normal reflexes, muscle tone coordination. No cranial nerve deficit noted. PSYCHIATRIC: Normal mood and affect. Normal behavior. Normal judgment and thought content. CARDIOVASCULAR: Normal heart rate noted RESPIRATORY: Effort and breath sounds normal, no problems with respiration noted ABDOMEN: Soft, no distention noted.   PELVIC: Normal appearing external genitalia; normal appearing vaginal mucosa with mild atrophy and vaginal cuff.  No abnormal discharge noted.  R adnexal tenderness on bimanual exam. MUSCULOSKELETAL: Normal range of motion. No edema noted.  Labs and Imaging 07/23/15 CT ABDOMEN AND PELVIS WITHOUT CONTRAST CLINICAL DATA:  Epigastric fullness, evaluate for hernia, mid abdominal pain over the last 3 weeks  COMPARISON:  CT abdomen and pelvis of 04/07/2015  FINDINGS:  The lung bases are clear. The heart is mildly enlarged and stable. No pericardial effusion is seen. The liver appears diffusely low attenuation suggesting fatty infiltration. No focal hepatic abnormality is seen on this unenhanced study. Surgical clips are noted from prior cholecystectomy. The pancreas is normal in size and the pancreatic duct is not  dilated. The adrenal glands and spleen are unremarkable. The stomach is slightly distended with fluid with no abnormality noted. No renal calculi are seen and there is no evidence of hydronephrosis. The proximal ureters are normal in caliber. The abdominal aorta is normal in caliber. A small ventral hernia is noted just above the umbilicus containing only fat. No other anterior abdominal wall hernia is noted.  The distal ureters are normal in caliber and no distal ureteral calculus is seen. Multiple calcified phleboliths are noted in the pelvis. The urinary bladder is not well distended and cannot be evaluated. The patient has previously undergone hysterectomy. The right ovary is prominent measuring 4.8 x 3.2 cm, with attenuation of 5 HU consistent with right ovarian cyst. The left ovary is unremarkable. No free fluid is seen within the pelvis. If further assessment of the ovaries is necessary then pelvic ultrasound with transvaginal component would be recommended. The colon is largely decompressed. The terminal ileum and the appendix are well seen with no abnormality noted. A benign-appearing lesion is noted within the left femoral neck which appears well corticated and unchanged possibly due to prior surgical intervention. The lumbar vertebrae are in normal alignment with normal intervertebral disc spaces.  IMPRESSION: 1. Only a small ventral hernia is noted just above the umbilicus containing only fat. No other abdominal wall hernia is seen. 2. Suspect fatty infiltration of the liver. Correlate with liver function tests. 3. Mild cardiomegaly. 4. Probable right ovarian cyst of 4.8 x 3.2 cm. Consider ultrasound of the pelvis if warranted for further assessment. 5. The appendix and terminal ileum are unremarkable.   Electronically Signed   By: Dwyane Dee M.D.   On: 07/23/2015 10:01   Assessment & Plan:  1. Cyst of right ovary Will get pelvic ultrasound for further  evaluation. - US Pelvis Complete; Future - US Transvaginal Non-OB; Future  2. Vaginal dryness, menopausal Recommended water-based lubrication. Will call/return for worsening symptoms. Not an ideal candidate for topical estrogen given medical history. Routine preventative health maintenance measures emphasized. Please refer to After Visit Summary for other counseling recommendations.   Return if symptoms worsen or fail to improve.   Total face-to-face time with patient: 20 minutes. Over 50% of encounter was spent on counseling and coordination of care.   Jaynie Collins, MD, FACOG Attending Obstetrician & Gynecologist, [facility] for [employer], [facility]

## 2015-09-28 NOTE — Patient Instructions (Signed)
Thank you for enrolling in MyChart. Please follow the instructions below to securely access your online medical record. MyChart allows you to send messages to your doctor, view your test results, renew your prescriptions, schedule appointments, and more.  How Do I Sign Up? 1. In your Internet browser, go to [email or web address] 2. Click on the New  User? link in the Sign In box.  3. Enter your MyChart Access Code exactly as it appears below. You will not need to use this code after you have completed the sign-up process. If you do not sign up before the expiration date, you must request a new code. MyChart Access Code: [email or web address] Expires: 11/27/2015  2:10 PM  4. Enter the last four digits of your Social Security Number (xxxx) and Date of Birth (mm/dd/yyyy) as indicated and click Next. You will be taken to the next sign-up page. 5. Create a MyChart ID. This will be your MyChart login ID and cannot be changed, so think of one that is secure and easy to remember. 6. Create a MyChart password. You can change your password at any time. 7. Enter your Password Reset Question and Answer and click Next. This can be used at a later time if you forget your password.  8. Select your communication preference, and if applicable enter your e-mail address. You will receive e-mail notification when new information is available in MyChart by choosing to receive e-mail notifications and filling in your e-mail. 9. Click Sign In. You can now view your medical record.   Additional Information If you have questions, you can email [email or web address] or call [phone] to talk to our MyChart staff. Remember, MyChart is NOT to be used for urgent needs. For medical emergencies, dial 911.

## 2015-09-30 ENCOUNTER — Ambulatory Visit (HOSPITAL_COMMUNITY)
Admission: RE | Admit: 2015-09-30 | Discharge: 2015-09-30 | Disposition: A | Discharge status: Home / Self Care | Payer: Medicare Other | Source: Ambulatory Visit | Attending: Obstetrics & Gynecology | Admitting: Obstetrics & Gynecology

## 2015-09-30 DIAGNOSIS — N83201 Unspecified ovarian cyst, right side: Secondary | ICD-10-CM | POA: Diagnosis not present

## 2015-10-12 DIAGNOSIS — Z79899 Other long term (current) drug therapy: Secondary | ICD-10-CM | POA: Diagnosis not present

## 2015-10-12 DIAGNOSIS — F250 Schizoaffective disorder, bipolar type: Secondary | ICD-10-CM | POA: Diagnosis not present

## 2015-11-16 ENCOUNTER — Other Ambulatory Visit: Payer: Self-pay | Admitting: Medical

## 2015-11-23 ENCOUNTER — Encounter: Payer: Self-pay | Admitting: *Deleted

## 2015-12-03 ENCOUNTER — Encounter: Payer: Medicare Other | Admitting: Medical

## 2015-12-22 ENCOUNTER — Other Ambulatory Visit: Payer: Self-pay | Admitting: Medical

## 2015-12-22 ENCOUNTER — Other Ambulatory Visit: Payer: Self-pay | Admitting: Family Medicine

## 2015-12-22 DIAGNOSIS — Z1231 Encounter for screening mammogram for malignant neoplasm of breast: Secondary | ICD-10-CM

## 2015-12-30 ENCOUNTER — Ambulatory Visit (INDEPENDENT_AMBULATORY_CARE_PROVIDER_SITE_OTHER): Payer: Medicare Other | Admitting: Medical

## 2015-12-30 ENCOUNTER — Telehealth: Payer: Self-pay | Admitting: Medical

## 2015-12-30 ENCOUNTER — Encounter: Payer: Self-pay | Admitting: Medical

## 2015-12-30 VITALS — BP 138/88 | HR 88 | Wt 228.6 lb

## 2015-12-30 DIAGNOSIS — Z789 Other specified health status: Secondary | ICD-10-CM

## 2015-12-30 DIAGNOSIS — R419 Unspecified symptoms and signs involving cognitive functions and awareness: Secondary | ICD-10-CM

## 2015-12-30 DIAGNOSIS — I10 Essential (primary) hypertension: Secondary | ICD-10-CM | POA: Diagnosis not present

## 2015-12-30 DIAGNOSIS — F17200 Nicotine dependence, unspecified, uncomplicated: Secondary | ICD-10-CM | POA: Diagnosis not present

## 2015-12-30 DIAGNOSIS — F250 Schizoaffective disorder, bipolar type: Secondary | ICD-10-CM | POA: Diagnosis not present

## 2015-12-30 DIAGNOSIS — Z794 Long term (current) use of insulin: Secondary | ICD-10-CM | POA: Diagnosis not present

## 2015-12-30 DIAGNOSIS — F4310 Post-traumatic stress disorder, unspecified: Secondary | ICD-10-CM | POA: Diagnosis not present

## 2015-12-30 DIAGNOSIS — E1140 Type 2 diabetes mellitus with diabetic neuropathy, unspecified: Secondary | ICD-10-CM

## 2015-12-30 DIAGNOSIS — F411 Generalized anxiety disorder: Secondary | ICD-10-CM | POA: Diagnosis not present

## 2015-12-30 DIAGNOSIS — E785 Hyperlipidemia, unspecified: Secondary | ICD-10-CM | POA: Diagnosis not present

## 2015-12-30 DIAGNOSIS — Z659 Problem related to unspecified psychosocial circumstances: Secondary | ICD-10-CM

## 2015-12-30 DIAGNOSIS — IMO0002 Reserved for concepts with insufficient information to code with codable children: Secondary | ICD-10-CM

## 2015-12-30 DIAGNOSIS — E1165 Type 2 diabetes mellitus with hyperglycemia: Secondary | ICD-10-CM

## 2015-12-30 LAB — CBC
HCT: 36.6 % (ref 35.0–45.0)
HEMOGLOBIN: 11.7 g/dL (ref 11.7–15.5)
MCH: 26.2 pg — AB (ref 27.0–33.0)
MCHC: 32 g/dL (ref 32.0–36.0)
MCV: 81.9 fL (ref 80.0–100.0)
MPV: 10.1 fL (ref 7.5–12.5)
Platelets: 421 10*3/uL — ABNORMAL HIGH (ref 140–400)
RBC: 4.47 MIL/uL (ref 3.80–5.10)
RDW: 14.9 % (ref 11.0–15.0)
WBC: 9.4 10*3/uL (ref 4.0–10.5)

## 2015-12-30 LAB — COMPREHENSIVE METABOLIC PANEL
ALT: 12 U/L (ref 6–29)
AST: 14 U/L (ref 10–35)
Albumin: 3.8 g/dL (ref 3.6–5.1)
Alkaline Phosphatase: 93 U/L (ref 33–130)
BUN: 9 mg/dL (ref 7–25)
CALCIUM: 9.4 mg/dL (ref 8.6–10.4)
CHLORIDE: 102 mmol/L (ref 98–110)
CO2: 25 mmol/L (ref 20–31)
Creat: 0.88 mg/dL (ref 0.50–1.05)
GLUCOSE: 168 mg/dL — AB (ref 65–99)
POTASSIUM: 4.3 mmol/L (ref 3.5–5.3)
Sodium: 136 mmol/L (ref 135–146)
Total Bilirubin: 0.3 mg/dL (ref 0.2–1.2)
Total Protein: 7.3 g/dL (ref 6.1–8.1)

## 2015-12-30 MED ORDER — INSULIN LISPRO 100 UNIT/ML (KWIKPEN): 12.0000 [IU] | PEN_INJECTOR | Freq: Three times a day (TID) | SUBCUTANEOUS | 5 refills | Status: DC

## 2015-12-30 MED ORDER — ROSUVASTATIN CALCIUM 10 MG PO TABS: 10.0000 mg | ORAL_TABLET | Freq: Every day | ORAL | 3 refills | Status: DC

## 2015-12-30 MED ORDER — FERROUS SULFATE 325 (65 FE) MG PO TABS: 325.0000 mg | ORAL_TABLET | Freq: Two times a day (BID) | ORAL | 0 refills | Status: DC

## 2015-12-30 MED ORDER — FLUTICASONE-SALMETEROL 250-50 MCG/DOSE IN AEPB: 1.0000 | INHALATION_SPRAY | Freq: Two times a day (BID) | RESPIRATORY_TRACT | 5 refills | Status: DC

## 2015-12-30 MED ORDER — INSULIN DEGLUDEC 100 UNIT/ML ~~LOC~~ SOPN: 41.0000 [IU] | PEN_INJECTOR | Freq: Every day | SUBCUTANEOUS | 5 refills | Status: DC

## 2015-12-30 MED ORDER — ALBUTEROL SULFATE HFA 108 (90 BASE) MCG/ACT IN AERS: 2.0000 | INHALATION_SPRAY | Freq: Four times a day (QID) | RESPIRATORY_TRACT | 1 refills | Status: DC | PRN

## 2015-12-30 MED ORDER — METFORMIN HCL ER 500 MG PO TB24: ORAL_TABLET | ORAL | 3 refills | Status: DC

## 2015-12-30 NOTE — Telephone Encounter (Signed)
See refer info about endocrinology.   Also, she has been getting home health through [facility] program, although I don't think they are sending me any notes.  Please call them and get latest note.   She said something about them needing updated orders.

## 2015-12-30 NOTE — Telephone Encounter (Signed)
Please refer [place] in [place] other than Dr. Leslie DalesAltheimer.  She didn't want to go back there

## 2015-12-30 NOTE — Patient Instructions (Addendum)
Recommendations:  STOP bydureon/Exenatide weekly injection  Increase the night time daily insulin/Tresiba to 41 units nightly  Increase meal time insulin/Humalog to 12 units with meals unless glucose is under 150 before meals  Continue to check sugars before meals and write them down

## 2015-12-30 NOTE — Progress Notes (Signed)
Subjective: Chief Complaint  Patient presents with  . blood suger    blood are running high ,her vision is off    Here for elevated sugars consistently in the 200, sometimes as high as 400s.   She notes that she never went to the endocrinologist [place] due to lack of transportation.  Thus, she hasn't seen anyone for diabetes since last visit here.  She is not 100% sure what she is taking but believe she is still doing 2238 u Tresiba QHS, 10u meal time inulin each meal, metformin, and bydureon weekly.  She is checking feet, no concern  She notes making healthy diet choices, getting some walking.  Still has nurse aid coming in to help.   Needs letter to c/t [facility] program/home health.   Sees psychiatry and she denies any recent medication changes.   She requests to be admitted to the hospital to get her sugars under control.  Past Medical History:  Diagnosis Date  . Alopecia   . Anemia   . Anxiety   . Bipolar 1 disorder (HCC)   . COPD (chronic obstructive pulmonary disease) (HCC)   . Coronary artery disease   . Depression   . Diabetes mellitus 2010  . Gastritis   . GERD (gastroesophageal reflux disease)   . Headache    migraines  . Hypertension 2010  . Obesity   . Pneumonia   . PONV (postoperative nausea and vomiting)   . PTSD (post-traumatic stress disorder)   . Schizophrenia (HCC)   . Tobacco use    Current Outpatient Prescriptions on File Prior to Visit  Medication Sig Dispense Refill  . ALPRAZolam (XANAX) 1 MG tablet Take 1 mg by mouth 2 (two) times daily.  0  . amLODipine (NORVASC) 10 MG tablet Take 1 tablet (10 mg total) by mouth daily. 90 tablet 3  . atenolol (TENORMIN) 50 MG tablet Take 1 tablet (50 mg total) by mouth 2 (two) times daily. 180 tablet 3  . benztropine (COGENTIN) 0.5 MG tablet Take 0.5 mg by mouth 2 (two) times daily. Reported on 04/20/2015    . busPIRone (BUSPAR) 15 MG tablet Take 15 mg by mouth 2 (two) times daily. Reported on  04/20/2015    . diphenhydrAMINE (BENADRYL) 25 mg capsule Take 50 mg by mouth at bedtime. Reported on 04/07/2015    . fluvoxaMINE (LUVOX) 25 MG tablet Take 25 mg by mouth 2 (two) times daily.    . furosemide (LASIX) 20 MG tablet Take 1 tablet (20 mg total) by mouth daily. 90 tablet 3  . glucose blood test strip 1 each by Other route 3 (three) times daily. Use as instructed 100 each 5  . nitroGLYCERIN (NITROSTAT) 0.4 MG SL tablet Place 0.4 mg under the tongue every 5 (five) minutes as needed for chest pain. Reported on 07/15/2015    . NOVOFINE PLUS 32G X 4 MM MISC USE TWICE DAILY 100 each 5  . olmesartan (BENICAR) 40 MG tablet Take 1 tablet (40 mg total) by mouth daily. 90 tablet 3  . pantoprazole (PROTONIX) 40 MG tablet take 1 tablet by mouth twice a day 40 tablet 1  . zolpidem (AMBIEN) 10 MG tablet Take 1 tablet by mouth daily.  0  . EPINEPHrine (EPIPEN 2-PAK) 0.3 mg/0.3 mL IJ SOAJ injection Inject 0.3 mLs (0.3 mg total) into the muscle once. (Patient not taking: Reported on 07/30/2015) 1 Device 2  . FANAPT 6 MG TABS   0  . FLUoxetine (  PROZAC) 20 MG capsule Take 20 mg by mouth daily.  0  . INVEGA TRINZA 819 MG/2.625ML SUSP   0   No current facility-administered medications on file prior to visit.     ROS as in subjective  Objective: BP 138/88   Pulse 88   Wt 228 lb 9.6 oz (103.7 kg)   SpO2 98%   BMI 39.24 kg/m   Gen: wd, wn, nad Heart rrr, normal s1, s2, no murmurs Lungs clear Ext: no edema Pulses wnl Psych: flat affect, rarely blinking her eyes, staring intensively but answers questions when prompted    Assessment: Encounter Diagnoses  Name Primary?  Marland Kitchen. Uncontrolled type 2 diabetes mellitus with diabetic neuropathy, with long-term current use of insulin (HCC) Yes  . Essential hypertension   . Generalized anxiety disorder   . TOBACCO ABUSE   . POST TRAUMATIC STRESS SYNDROME   . Schizoaffective disorder, bipolar type (HCC)   . Decreased activities of daily living (ADL)   .  Cognitive safety issue   . Hyperlipidemia, unspecified hyperlipidemia type      Plan: In general not at goal with diabetes.  I believe she is making efforts with diet, limited exercise.   Compliant with medications.  Labs today.  Referral to endocrinology.   Of note, she didn't want to go back to Dr.Altheimer.  Only saw him once.  Her barriers to care are transportation, below average cognition and intelligence, significant mental health issues, inability to fully take care of her ADLs.    Made changes as below today to her diabetes regimen.  F/u here in 78mo.   Patient Instructions  Recommendations:  STOP bydureon/Exenatide weekly injection  Increase the night time daily insulin/Tresiba to 41 units nightly  Increase meal time insulin/Humalog to 12 units with meals unless glucose is under 150 before meals  Continue to check sugars before meals and write them down    Cheyenne Alvarez was seen today for blood suger.  Diagnoses and all orders for this visit:  Uncontrolled type 2 diabetes mellitus with diabetic neuropathy, with long-term current use of insulin (HCC) -     Comprehensive metabolic panel -     CBC -     Hemoglobin A1c  Essential hypertension  Generalized anxiety disorder  TOBACCO ABUSE  POST TRAUMATIC STRESS SYNDROME  Schizoaffective disorder, bipolar type (HCC)  Decreased activities of daily living (ADL)  Cognitive safety issue  Hyperlipidemia, unspecified hyperlipidemia type  Other orders -     albuterol (PROVENTIL HFA;VENTOLIN HFA) 108 (90 Base) MCG/ACT inhaler; Inhale 2 puffs into the lungs every 6 (six) hours as needed for wheezing. For wheezing -     ferrous sulfate 325 (65 FE) MG tablet; Take 1 tablet (325 mg total) by mouth 2 (two) times daily with a meal. -     Fluticasone-Salmeterol (ADVAIR DISKUS) 250-50 MCG/DOSE AEPB; Inhale 1 puff into the lungs 2 (two) times daily. -     metFORMIN (GLUCOPHAGE XR) 500 MG 24 hr tablet; 2 tablets po daily -      rosuvastatin (CRESTOR) 10 MG tablet; Take 1 tablet (10 mg total) by mouth daily. -     insulin degludec (TRESIBA FLEXTOUCH) 100 UNIT/ML SOPN FlexTouch Pen; Inject 0.41 mLs (41 Units total) into the skin daily at 10 pm. -     insulin lispro (HUMALOG KWIKPEN) 100 UNIT/ML KiwkPen; Inject 0.12 mLs (12 Units total) into the skin 3 (three) times daily.

## 2015-12-31 ENCOUNTER — Other Ambulatory Visit: Payer: Self-pay | Admitting: Medical

## 2015-12-31 DIAGNOSIS — E1140 Type 2 diabetes mellitus with diabetic neuropathy, unspecified: Secondary | ICD-10-CM

## 2015-12-31 LAB — HEMOGLOBIN A1C
Hgb A1c MFr Bld: 9.2 % — ABNORMAL HIGH (ref <5.7)
Mean Plasma Glucose: 217 mg/dL

## 2015-12-31 NOTE — Telephone Encounter (Signed)
Faxed over referral to [facility] @ [phone] as they take medicaid and located in [place].  Spoke to patient and she uses [facility]. [phone]. I have called them and they will call be back

## 2016-01-03 ENCOUNTER — Telehealth: Payer: Self-pay | Admitting: Medical

## 2016-01-03 NOTE — Telephone Encounter (Signed)
P.A. HUMALOG °

## 2016-01-05 NOTE — Telephone Encounter (Signed)
P.A. Approved til 02/27/17 

## 2016-01-06 NOTE — Telephone Encounter (Signed)
Left message for pt, faxed pharmacy  

## 2016-01-13 ENCOUNTER — Ambulatory Visit
Admission: RE | Admit: 2016-01-13 | Discharge: 2016-01-13 | Disposition: A | Discharge status: Home / Self Care | Payer: Medicare Other | Source: Ambulatory Visit | Attending: Medical | Admitting: Medical

## 2016-01-13 DIAGNOSIS — Z1231 Encounter for screening mammogram for malignant neoplasm of breast: Secondary | ICD-10-CM

## 2016-01-25 ENCOUNTER — Other Ambulatory Visit: Payer: Self-pay | Admitting: Medical

## 2016-01-25 DIAGNOSIS — Z79899 Other long term (current) drug therapy: Secondary | ICD-10-CM | POA: Diagnosis not present

## 2016-01-25 DIAGNOSIS — F250 Schizoaffective disorder, bipolar type: Secondary | ICD-10-CM | POA: Diagnosis not present

## 2016-01-27 ENCOUNTER — Telehealth: Payer: Self-pay | Admitting: Medical

## 2016-01-27 DIAGNOSIS — E119 Type 2 diabetes mellitus without complications: Secondary | ICD-10-CM

## 2016-01-27 NOTE — Telephone Encounter (Signed)
I just received info from [facility] that she no showed her appt for 12/24/15.    She wasn't happy with the other endocrinlogist I referred her to, and that is why we made this appt with [facility].  There is only a hand full of providers that will take Medicaid.   Make sure they are aware that when we refer to a specialist, no showing or not being able to be reached can result in the specialist not taking them as a patient, make cause them to incur no show fees, and is just not appropriate in general to us or them.    Please either have them call back to set up the appointment or document their reply.  If she is not going to go there, then get her back in here in a month.  I also want to refer to social worker to help keep her on track with appointments and care.   Try [facility] social work, such as Emilia BeckPamela Faulkner

## 2016-01-28 NOTE — Telephone Encounter (Signed)
Called patient about to  This, she said that she was unaware that she had an appt. Called  [facility]o at [facility]  Spoke with t.j  She said that  They called and spoke . They said the next new pt. appt for is June, a new send referral to [place].

## 2016-03-06 NOTE — Progress Notes (Signed)
Subjective:    Patient ID: Cheyenne Alvarez, female    DOB: 1965-12-06, 51 y.o.   MRN: [id number]  HPI pt is referred by Crosby Oyster, PA, for diabetes.  Pt states DM was dx'ed in 2014; she has mild if any neuropathy of the lower extremities, but she has associated CAD; she has been on insulin since 2015; pt says her diet and exercise are good; she has never had GDM, pancreatitis, severe hypoglycemia or DKA.  She takes metformin and multiple daily injections.  She says she does not forget the insulin, but she skips when cbg is below 100.  She says it varies from 90-400's.  It is in general higher as the day goes on Past Medical History:  Diagnosis Date  . Alopecia   . Anemia   . Anxiety   . Bipolar 1 disorder (HCC)   . COPD (chronic obstructive pulmonary disease) (HCC)   . Coronary artery disease   . Depression   . Diabetes mellitus 2010  . Gastritis   . GERD (gastroesophageal reflux disease)   . Headache    migraines  . Hypertension 2010  . Obesity   . Pneumonia   . PONV (postoperative nausea and vomiting)   . PTSD (post-traumatic stress disorder)   . Schizophrenia (HCC)   . Tobacco use     Past Surgical History:  Procedure Laterality Date  . ABDOMINAL HYSTERECTOMY    . CARDIAC CATHETERIZATION    . CHOLECYSTECTOMY    . COLONOSCOPY  05/14/14   hemorrhoids, otherwise normal; Dr. Charlott Rakes  . COLONOSCOPY WITH PROPOFOL N/A 05/14/2014   Procedure: COLONOSCOPY WITH PROPOFOL;  Surgeon: Charlott Rakes, MD;  Location: [facility] ENDOSCOPY;  Service: Endoscopy;  Laterality: N/A;  . ESOPHAGOGASTRODUODENOSCOPY (EGD) WITH PROPOFOL N/A 05/14/2014   Procedure: ESOPHAGOGASTRODUODENOSCOPY (EGD) WITH PROPOFOL;  Surgeon: Charlott Rakes, MD;  Location: [facility] ENDOSCOPY;  Service: Endoscopy;  Laterality: N/A;  . FRACTURE SURGERY     left leg  . LEG SURGERY    . TUBAL LIGATION      Social History   Social History  . Marital status: Divorced    Spouse name: N/A  . Number of children:  N/A  . Years of education: N/A   Occupational History  . Not on file.   Social History Main Topics  . Smoking status: Light Tobacco Smoker    Last attempt to quit: 12/18/2013  . Smokeless tobacco: Never Used  . Alcohol use No  . Drug use: No  . Sexual activity: Not on file   Other Topics Concern  . Not on file   Social History Narrative  . No narrative on file    Current Outpatient Prescriptions on File Prior to Visit  Medication Sig Dispense Refill  . albuterol (PROVENTIL HFA;VENTOLIN HFA) 108 (90 Base) MCG/ACT inhaler Inhale 2 puffs into the lungs every 6 (six) hours as needed for wheezing. For wheezing 18 g 1  . ALPRAZolam (XANAX) 1 MG tablet Take 1 mg by mouth 2 (two) times daily.  0  . amLODipine (NORVASC) 10 MG tablet Take 1 tablet (10 mg total) by mouth daily. 90 tablet 3  . atenolol (TENORMIN) 50 MG tablet Take 1 tablet (50 mg total) by mouth 2 (two) times daily. 180 tablet 3  . benztropine (COGENTIN) 0.5 MG tablet Take 0.5 mg by mouth 2 (two) times daily. Reported on 04/20/2015    . busPIRone (BUSPAR) 15 MG tablet Take 15 mg by mouth 2 (two) times daily. Reported on 04/20/2015    .  diphenhydrAMINE (BENADRYL) 25 mg capsule Take 50 mg by mouth at bedtime. Reported on 04/07/2015    . EPINEPHrine (EPIPEN 2-PAK) 0.3 mg/0.3 mL IJ SOAJ injection Inject 0.3 mLs (0.3 mg total) into the muscle once. 1 Device 2  . FANAPT 6 MG TABS   0  . ferrous sulfate 325 (65 FE) MG tablet Take 1 tablet (325 mg total) by mouth 2 (two) times daily with a meal. 180 tablet 0  . FLUoxetine (PROZAC) 20 MG capsule Take 20 mg by mouth daily.  0  . Fluticasone-Salmeterol (ADVAIR DISKUS) 250-50 MCG/DOSE AEPB Inhale 1 puff into the lungs 2 (two) times daily. 60 each 5  . fluvoxaMINE (LUVOX) 25 MG tablet Take 25 mg by mouth 2 (two) times daily.    . furosemide (LASIX) 20 MG tablet Take 1 tablet (20 mg total) by mouth daily. 90 tablet 3  . glucose blood test strip 1 each by Other route 3 (three) times daily. Use  as instructed 100 each 5  . INVEGA TRINZA 819 MG/2.625ML SUSP   0  . metFORMIN (GLUCOPHAGE XR) 500 MG 24 hr tablet 2 tablets po daily 180 tablet 3  . nitroGLYCERIN (NITROSTAT) 0.4 MG SL tablet Place 0.4 mg under the tongue every 5 (five) minutes as needed for chest pain. Reported on 07/15/2015    . NOVOFINE PLUS 32G X 4 MM MISC USE TWICE DAILY 100 each 5  . olmesartan (BENICAR) 40 MG tablet Take 1 tablet (40 mg total) by mouth daily. 90 tablet 3  . pantoprazole (PROTONIX) 40 MG tablet take 1 tablet by mouth twice a day 40 tablet 1  . rosuvastatin (CRESTOR) 10 MG tablet Take 1 tablet (10 mg total) by mouth daily. 90 tablet 3  . zolpidem (AMBIEN) 10 MG tablet Take 1 tablet by mouth daily.  0   No current facility-administered medications on file prior to visit.     Allergies  Allergen Reactions  . Dexilant [Dexlansoprazole] Other (See Comments)    Chest pain, sob, diarrhea, abd pain, vomiting  . Famotidine Anaphylaxis  . Meperidine Hcl Anaphylaxis  . Dilaudid [Hydromorphone Hcl]     Change in mental state  . Gabapentin Other (See Comments)    Memory loss  . Hydrocodone Nausea And Vomiting  . Ramipril     angioedema  . Ziprasidone Hcl Other (See Comments)    Causing her to convulse  . Invokana [Canagliflozin] Rash  . Iohexol Itching and Rash  . Tape Rash    Use paper tape only     Family History  Problem Relation Age of Onset  . Diabetes Mother   . Kidney failure Mother   . Diabetes Other   . Cancer Other   . Hypertension Other    BP 136/84   Pulse 92   Ht 5\' 4"  (1.626 m)   Wt 227 lb (103 kg)   SpO2 96%   BMI 38.96 kg/m   Review of Systems denies blurry vision, headache, chest pain, sob, n/v, muscle cramps, excessive diaphoresis, memory loss, cold intolerance, and rhinorrhea.  She has lost a few lbs.  She has urinary frequency and easy bruising.     Objective:   Physical Exam VS: see vs page GEN: no distress HEAD: head: no deformity eyes: no periorbital swelling,  no proptosis external nose and ears are normal mouth: no lesion seen NECK: supple, thyroid is not enlarged CHEST WALL: no deformity LUNGS: clear to auscultation CV: reg rate and rhythm, no murmur ABD: abdomen is  soft, nontender.  no hepatosplenomegaly.  not distended.  no hernia MUSCULOSKELETAL: muscle bulk and strength are grossly normal.  no obvious joint swelling.  gait is normal and steady EXTEMITIES: no deformity.  no ulcer on the feet.  feet are of normal color and temp.  no edema PULSES: dorsalis pedis intact bilat.  no carotid bruit NEURO:  cn 2-12 grossly intact.   readily moves all 4's.  sensation is intact to touch on the feet SKIN:  Normal texture and temperature.  No rash or suspicious lesion is visible.   NODES:  None palpable at the neck PSYCH: alert, well-oriented.  Does not appear anxious nor depressed.   Lab Results  Component Value Date   HGBA1C 9.2 (H) 12/30/2015   I personally reviewed electrocardiogram tracing (06/26/15): Indication: DM.  Impression: normal.      Assessment & Plan:  Insulin-requiring type 2 DM, with CAD, new to me: she may need a simpler regimen, but we'll continue multiple daily injections for now.    Patient is advised the following: Patient Instructions  good diet and exercise significantly improve the control of your diabetes.  please let me know if you wish to be referred to a dietician.  high blood sugar is very risky to your health.  you should see an eye doctor and dentist every year.  It is very important to get all recommended vaccinations.  Controlling your blood pressure and cholesterol drastically reduces the damage diabetes does to your body.  Those who smoke should quit.  Please discuss these with your primary care provider.  check your blood sugar twice a day.  vary the time of day when you check, between before the 3 meals, and at bedtime.  also check if you have symptoms of your blood sugar being too high or too low.  please keep  a record of the readings and bring it to your next appointment here (or you can bring the meter itself).  You can write it on any piece of paper.  please call us sooner if your blood sugar goes below 70, or if you have a lot of readings over 200.   For now, please: increase humalog to 20 units 3 times a day (just before each meal), and:  Decrease tresiba to 30 units at bedtime, and:  Please continue the same metformin.  Please come back for a follow-up appointment in 2 weeks.

## 2016-03-08 ENCOUNTER — Ambulatory Visit (INDEPENDENT_AMBULATORY_CARE_PROVIDER_SITE_OTHER): Payer: Medicare Other | Admitting: Endocrinology

## 2016-03-08 ENCOUNTER — Encounter: Payer: Self-pay | Admitting: Endocrinology

## 2016-03-08 VITALS — BP 136/84 | HR 92 | Ht 64.0 in | Wt 227.0 lb

## 2016-03-08 DIAGNOSIS — E1140 Type 2 diabetes mellitus with diabetic neuropathy, unspecified: Secondary | ICD-10-CM

## 2016-03-08 DIAGNOSIS — E1165 Type 2 diabetes mellitus with hyperglycemia: Secondary | ICD-10-CM

## 2016-03-08 DIAGNOSIS — Z794 Long term (current) use of insulin: Secondary | ICD-10-CM

## 2016-03-08 DIAGNOSIS — IMO0002 Reserved for concepts with insufficient information to code with codable children: Secondary | ICD-10-CM

## 2016-03-08 LAB — POCT GLYCOSYLATED HEMOGLOBIN (HGB A1C): HEMOGLOBIN A1C: 10.2

## 2016-03-08 MED ORDER — INSULIN DEGLUDEC 100 UNIT/ML ~~LOC~~ SOPN: 30.0000 [IU] | PEN_INJECTOR | Freq: Every day | SUBCUTANEOUS | 5 refills | Status: DC

## 2016-03-08 MED ORDER — INSULIN LISPRO 100 UNIT/ML (KWIKPEN): 20.0000 [IU] | PEN_INJECTOR | Freq: Three times a day (TID) | SUBCUTANEOUS | 5 refills | Status: DC

## 2016-03-08 NOTE — Patient Instructions (Addendum)
good diet and exercise significantly improve the control of your diabetes.  please let me know if you wish to be referred to a dietician.  high blood sugar is very risky to your health.  you should see an eye doctor and dentist every year.  It is very important to get all recommended vaccinations.  Controlling your blood pressure and cholesterol drastically reduces the damage diabetes does to your body.  Those who smoke should quit.  Please discuss these with your primary care provider.  check your blood sugar twice a day.  vary the time of day when you check, between before the 3 meals, and at bedtime.  also check if you have symptoms of your blood sugar being too high or too low.  please keep a record of the readings and bring it to your next appointment here (or you can bring the meter itself).  You can write it on any piece of paper.  please call us sooner if your blood sugar goes below 70, or if you have a lot of readings over 200.   For now, please: increase humalog to 20 units 3 times a day (just before each meal), and:  Decrease tresiba to 30 units at bedtime, and:  Please continue the same metformin.  Please come back for a follow-up appointment in 2 weeks.

## 2016-03-20 NOTE — Progress Notes (Signed)
Subjective:    Patient ID: Cheyenne Alvarez, female    DOB: [birth year], 51 y.o.   MRN: [id number]  HPI Pt returns for f/u of diabetes mellitus: DM type: Insulin-requiring type 2 Dx'ed: 2014 Complications: CAD Therapy: insulin since 2015 GDM: never DKA: never Severe hypoglycemia: never Pancreatitis: never Other: she takes multiple daily injections, but might need to change to qd insulin. Interval history: no cbg record, but states cbg's vary from 190-230.  There is no trend throughout the day.  She seldom misses the insulin.  pt states she feels well in general.  Past Medical History:  Diagnosis Date  . Alopecia   . Anemia   . Anxiety   . Bipolar 1 disorder (HCC)   . COPD (chronic obstructive pulmonary disease) (HCC)   . Coronary artery disease   . Depression   . Diabetes mellitus 2010  . Gastritis   . GERD (gastroesophageal reflux disease)   . Headache    migraines  . Hypertension 2010  . Obesity   . Pneumonia   . PONV (postoperative nausea and vomiting)   . PTSD (post-traumatic stress disorder)   . Schizophrenia (HCC)   . Tobacco use     Past Surgical History:  Procedure Laterality Date  . ABDOMINAL HYSTERECTOMY    . CARDIAC CATHETERIZATION    . CHOLECYSTECTOMY    . COLONOSCOPY  05/14/14   hemorrhoids, otherwise normal; Dr. Charlott Rakes  . COLONOSCOPY WITH PROPOFOL N/A 05/14/2014   Procedure: COLONOSCOPY WITH PROPOFOL;  Surgeon: Charlott Rakes, MD;  Location: [facility] ENDOSCOPY;  Service: Endoscopy;  Laterality: N/A;  . ESOPHAGOGASTRODUODENOSCOPY (EGD) WITH PROPOFOL N/A 05/14/2014   Procedure: ESOPHAGOGASTRODUODENOSCOPY (EGD) WITH PROPOFOL;  Surgeon: Charlott Rakes, MD;  Location: [facility] ENDOSCOPY;  Service: Endoscopy;  Laterality: N/A;  . FRACTURE SURGERY     left leg  . LEG SURGERY    . TUBAL LIGATION      Social History   Social History  . Marital status: Divorced    Spouse name: N/A  . Number of children: N/A  . Years of education: N/A    Occupational History  . Not on file.   Social History Main Topics  . Smoking status: Light Tobacco Smoker    Last attempt to quit: 12/18/2013  . Smokeless tobacco: Never Used  . Alcohol use No  . Drug use: No  . Sexual activity: Not on file   Other Topics Concern  . Not on file   Social History Narrative  . No narrative on file    Current Outpatient Prescriptions on File Prior to Visit  Medication Sig Dispense Refill  . albuterol (PROVENTIL HFA;VENTOLIN HFA) 108 (90 Base) MCG/ACT inhaler Inhale 2 puffs into the lungs every 6 (six) hours as needed for wheezing. For wheezing 18 g 1  . ALPRAZolam (XANAX) 1 MG tablet Take 1 mg by mouth 2 (two) times daily.  0  . amLODipine (NORVASC) 10 MG tablet Take 1 tablet (10 mg total) by mouth daily. 90 tablet 3  . atenolol (TENORMIN) 50 MG tablet Take 1 tablet (50 mg total) by mouth 2 (two) times daily. 180 tablet 3  . benztropine (COGENTIN) 0.5 MG tablet Take 0.5 mg by mouth 2 (two) times daily. Reported on 04/20/2015    . busPIRone (BUSPAR) 15 MG tablet Take 15 mg by mouth 2 (two) times daily. Reported on 04/20/2015    . diphenhydrAMINE (BENADRYL) 25 mg capsule Take 50 mg by mouth at bedtime. Reported on 04/07/2015    .  EPINEPHrine (EPIPEN 2-PAK) 0.3 mg/0.3 mL IJ SOAJ injection Inject 0.3 mLs (0.3 mg total) into the muscle once. 1 Device 2  . FANAPT 6 MG TABS   0  . ferrous sulfate 325 (65 FE) MG tablet Take 1 tablet (325 mg total) by mouth 2 (two) times daily with a meal. 180 tablet 0  . FLUoxetine (PROZAC) 20 MG capsule Take 20 mg by mouth daily.  0  . Fluticasone-Salmeterol (ADVAIR DISKUS) 250-50 MCG/DOSE AEPB Inhale 1 puff into the lungs 2 (two) times daily. 60 each 5  . fluvoxaMINE (LUVOX) 25 MG tablet Take 25 mg by mouth 2 (two) times daily.    . furosemide (LASIX) 20 MG tablet Take 1 tablet (20 mg total) by mouth daily. 90 tablet 3  . glucose blood test strip 1 each by Other route 3 (three) times daily. Use as instructed 100 each 5  .  INVEGA TRINZA 819 MG/2.625ML SUSP   0  . metFORMIN (GLUCOPHAGE XR) 500 MG 24 hr tablet 2 tablets po daily 180 tablet 3  . nitroGLYCERIN (NITROSTAT) 0.4 MG SL tablet Place 0.4 mg under the tongue every 5 (five) minutes as needed for chest pain. Reported on 07/15/2015    . NOVOFINE PLUS 32G X 4 MM MISC USE TWICE DAILY 100 each 5  . olmesartan (BENICAR) 40 MG tablet Take 1 tablet (40 mg total) by mouth daily. 90 tablet 3  . pantoprazole (PROTONIX) 40 MG tablet take 1 tablet by mouth twice a day 40 tablet 1  . rosuvastatin (CRESTOR) 10 MG tablet Take 1 tablet (10 mg total) by mouth daily. 90 tablet 3  . zolpidem (AMBIEN) 10 MG tablet Take 1 tablet by mouth daily.  0   No current facility-administered medications on file prior to visit.     Allergies  Allergen Reactions  . Dexilant [Dexlansoprazole] Other (See Comments)    Chest pain, sob, diarrhea, abd pain, vomiting  . Famotidine Anaphylaxis  . Meperidine Hcl Anaphylaxis  . Dilaudid [Hydromorphone Hcl]     Change in mental state  . Gabapentin Other (See Comments)    Memory loss  . Hydrocodone Nausea And Vomiting  . Ramipril     angioedema  . Ziprasidone Hcl Other (See Comments)    Causing her to convulse  . Invokana [Canagliflozin] Rash  . Iohexol Itching and Rash  . Tape Rash    Use paper tape only     Family History  Problem Relation Age of Onset  . Diabetes Mother   . Kidney failure Mother   . Diabetes Other   . Cancer Other   . Hypertension Other    BP 132/80   Pulse 87   Ht 5\' 4"  (1.626 m)   Wt 232 lb (105.2 kg)   SpO2 97%   BMI 39.82 kg/m   Review of Systems She denies hypoglycemia    Objective:   Physical Exam VITAL SIGNS:  See vs page GENERAL: no distress Pulses: dorsalis pedis intact bilat.   MSK: no deformity of the feet CV: no leg edema Skin:  no ulcer on the feet.  normal color and temp on the feet. Neuro: sensation is intact to touch on the feet.    Lab Results  Component Value Date   HGBA1C  10.2 03/08/2016      Assessment & Plan:  Insulin-requiring type 2 DM, with CAD: she needs increased rx. she may need a simpler regimen, but we'll continue multiple daily injections for now.    Patient  is advised the following: Patient Instructions  check your blood sugar twice a day.  vary the time of day when you check, between before the 3 meals, and at bedtime.  also check if you have symptoms of your blood sugar being too high or too low.  please keep a record of the readings and bring it to your next appointment here (or you can bring the meter itself).  You can write it on any piece of paper.  please call us sooner if your blood sugar goes below 70, or if you have a lot of readings over 200.   For now, please:  increase humalog to 25 units 3 times a day (just before each meal), and:  continue tresiba, 30 units at bedtime, and:  Please continue the same metformin.  Please come back for a follow-up appointment in 6 weeks.

## 2016-03-21 DIAGNOSIS — Z79899 Other long term (current) drug therapy: Secondary | ICD-10-CM | POA: Diagnosis not present

## 2016-03-21 DIAGNOSIS — F250 Schizoaffective disorder, bipolar type: Secondary | ICD-10-CM | POA: Diagnosis not present

## 2016-03-24 ENCOUNTER — Ambulatory Visit (INDEPENDENT_AMBULATORY_CARE_PROVIDER_SITE_OTHER): Payer: Medicare Other | Admitting: Endocrinology

## 2016-03-24 VITALS — BP 132/80 | HR 87 | Ht 64.0 in | Wt 232.0 lb

## 2016-03-24 DIAGNOSIS — E1165 Type 2 diabetes mellitus with hyperglycemia: Secondary | ICD-10-CM | POA: Diagnosis not present

## 2016-03-24 DIAGNOSIS — Z794 Long term (current) use of insulin: Secondary | ICD-10-CM

## 2016-03-24 DIAGNOSIS — E1140 Type 2 diabetes mellitus with diabetic neuropathy, unspecified: Secondary | ICD-10-CM

## 2016-03-24 DIAGNOSIS — IMO0002 Reserved for concepts with insufficient information to code with codable children: Secondary | ICD-10-CM

## 2016-03-24 MED ORDER — INSULIN DEGLUDEC 100 UNIT/ML ~~LOC~~ SOPN: 30.0000 [IU] | PEN_INJECTOR | Freq: Every day | SUBCUTANEOUS | 5 refills | Status: DC

## 2016-03-24 MED ORDER — INSULIN LISPRO 100 UNIT/ML (KWIKPEN): 25.0000 [IU] | PEN_INJECTOR | Freq: Three times a day (TID) | SUBCUTANEOUS | 5 refills | Status: DC

## 2016-03-24 NOTE — Patient Instructions (Addendum)
check your blood sugar twice a day.  vary the time of day when you check, between before the 3 meals, and at bedtime.  also check if you have symptoms of your blood sugar being too high or too low.  please keep a record of the readings and bring it to your next appointment here (or you can bring the meter itself).  You can write it on any piece of paper.  please call us sooner if your blood sugar goes below 70, or if you have a lot of readings over 200.   For now, please:  increase humalog to 25 units 3 times a day (just before each meal), and:  continue tresiba, 30 units at bedtime, and:  Please continue the same metformin.  Please come back for a follow-up appointment in 6 weeks.

## 2016-03-25 ENCOUNTER — Telehealth: Payer: Self-pay | Admitting: Endocrinology

## 2016-03-25 DIAGNOSIS — H2513 Age-related nuclear cataract, bilateral: Secondary | ICD-10-CM | POA: Diagnosis not present

## 2016-03-25 DIAGNOSIS — H04123 Dry eye syndrome of bilateral lacrimal glands: Secondary | ICD-10-CM | POA: Diagnosis not present

## 2016-03-25 DIAGNOSIS — E119 Type 2 diabetes mellitus without complications: Secondary | ICD-10-CM | POA: Diagnosis not present

## 2016-03-25 DIAGNOSIS — H1850 Unspecified hereditary corneal dystrophies: Secondary | ICD-10-CM | POA: Diagnosis not present

## 2016-03-25 NOTE — Telephone Encounter (Signed)
Last [facility]

## 2016-03-25 NOTE — Telephone Encounter (Signed)
[facility] need [facility] for this patient, she is there now. She was referred to them.

## 2016-05-05 ENCOUNTER — Ambulatory Visit: Payer: Self-pay | Admitting: Endocrinology

## 2016-05-05 DIAGNOSIS — Z0289 Encounter for other administrative examinations: Secondary | ICD-10-CM

## 2016-05-06 ENCOUNTER — Telehealth: Payer: Self-pay | Admitting: Endocrinology

## 2016-05-06 NOTE — Telephone Encounter (Signed)
Patient no showed today's appt. Please advise on how to follow up. °A. No follow up necessary. °B. Follow up urgent. Contact patient immediately. °C. Follow up necessary. Contact patient and schedule visit in ___ days. °D. Follow up advised. Contact patient and schedule visit in ____weeks. ° °

## 2016-05-06 NOTE — Telephone Encounter (Signed)
Please come back for a follow-up appointment in 6 weeks  

## 2016-05-15 ENCOUNTER — Telehealth: Payer: Self-pay | Admitting: Medical

## 2016-05-15 NOTE — Telephone Encounter (Signed)
P.A. ROSUVASTATIN  

## 2016-06-13 DIAGNOSIS — F200 Paranoid schizophrenia: Secondary | ICD-10-CM | POA: Diagnosis not present

## 2016-06-13 DIAGNOSIS — Z79899 Other long term (current) drug therapy: Secondary | ICD-10-CM | POA: Diagnosis not present

## 2016-07-01 ENCOUNTER — Ambulatory Visit (INDEPENDENT_AMBULATORY_CARE_PROVIDER_SITE_OTHER): Payer: Medicare Other | Admitting: Medical

## 2016-07-01 ENCOUNTER — Encounter: Payer: Self-pay | Admitting: Medical

## 2016-07-01 VITALS — BP 124/78 | HR 75 | Resp 10 | Ht 64.0 in | Wt 220.0 lb

## 2016-07-01 DIAGNOSIS — F4310 Post-traumatic stress disorder, unspecified: Secondary | ICD-10-CM

## 2016-07-01 DIAGNOSIS — R252 Cramp and spasm: Secondary | ICD-10-CM

## 2016-07-01 DIAGNOSIS — R32 Unspecified urinary incontinence: Secondary | ICD-10-CM | POA: Insufficient documentation

## 2016-07-01 DIAGNOSIS — Z0000 Encounter for general adult medical examination without abnormal findings: Secondary | ICD-10-CM

## 2016-07-01 DIAGNOSIS — Z7189 Other specified counseling: Secondary | ICD-10-CM | POA: Diagnosis not present

## 2016-07-01 DIAGNOSIS — F319 Bipolar disorder, unspecified: Secondary | ICD-10-CM | POA: Diagnosis not present

## 2016-07-01 DIAGNOSIS — T783XXD Angioneurotic edema, subsequent encounter: Secondary | ICD-10-CM | POA: Diagnosis not present

## 2016-07-01 DIAGNOSIS — D509 Iron deficiency anemia, unspecified: Secondary | ICD-10-CM

## 2016-07-01 DIAGNOSIS — E1140 Type 2 diabetes mellitus with diabetic neuropathy, unspecified: Secondary | ICD-10-CM

## 2016-07-01 DIAGNOSIS — Z2821 Immunization not carried out because of patient refusal: Secondary | ICD-10-CM

## 2016-07-01 DIAGNOSIS — K219 Gastro-esophageal reflux disease without esophagitis: Secondary | ICD-10-CM

## 2016-07-01 DIAGNOSIS — Z794 Long term (current) use of insulin: Secondary | ICD-10-CM

## 2016-07-01 DIAGNOSIS — I10 Essential (primary) hypertension: Secondary | ICD-10-CM

## 2016-07-01 DIAGNOSIS — F411 Generalized anxiety disorder: Secondary | ICD-10-CM | POA: Diagnosis not present

## 2016-07-01 DIAGNOSIS — E8941 Symptomatic postprocedural ovarian failure: Secondary | ICD-10-CM

## 2016-07-01 DIAGNOSIS — Z1231 Encounter for screening mammogram for malignant neoplasm of breast: Secondary | ICD-10-CM | POA: Insufficient documentation

## 2016-07-01 DIAGNOSIS — F250 Schizoaffective disorder, bipolar type: Secondary | ICD-10-CM

## 2016-07-01 DIAGNOSIS — Z79899 Other long term (current) drug therapy: Secondary | ICD-10-CM | POA: Insufficient documentation

## 2016-07-01 DIAGNOSIS — T464X5D Adverse effect of angiotensin-converting-enzyme inhibitors, subsequent encounter: Secondary | ICD-10-CM

## 2016-07-01 DIAGNOSIS — Z789 Other specified health status: Secondary | ICD-10-CM

## 2016-07-01 DIAGNOSIS — E785 Hyperlipidemia, unspecified: Secondary | ICD-10-CM

## 2016-07-01 DIAGNOSIS — F17200 Nicotine dependence, unspecified, uncomplicated: Secondary | ICD-10-CM

## 2016-07-01 DIAGNOSIS — E1165 Type 2 diabetes mellitus with hyperglycemia: Secondary | ICD-10-CM

## 2016-07-01 DIAGNOSIS — Z7185 Encounter for immunization safety counseling: Secondary | ICD-10-CM

## 2016-07-01 DIAGNOSIS — R6889 Other general symptoms and signs: Secondary | ICD-10-CM

## 2016-07-01 DIAGNOSIS — IMO0002 Reserved for concepts with insufficient information to code with codable children: Secondary | ICD-10-CM

## 2016-07-01 LAB — CBC
HCT: 36.7 % (ref 35.0–45.0)
Hemoglobin: 11.8 g/dL (ref 11.7–15.5)
MCH: 26.2 pg — ABNORMAL LOW (ref 27.0–33.0)
MCHC: 32.2 g/dL (ref 32.0–36.0)
MCV: 81.6 fL (ref 80.0–100.0)
MPV: 10.5 fL (ref 7.5–12.5)
PLATELETS: 353 10*3/uL (ref 140–400)
RBC: 4.5 MIL/uL (ref 3.80–5.10)
RDW: 16.1 % — ABNORMAL HIGH (ref 11.0–15.0)
WBC: 9.3 10*3/uL (ref 4.0–10.5)

## 2016-07-01 NOTE — Telephone Encounter (Signed)
This was already approved.

## 2016-07-01 NOTE — Progress Notes (Signed)
Subjective:    Cheyenne Alvarez is a 51 y.o. female who presents for Preventative Services visit and chronic medical problems/med check visit.    Primary Care Provider Absalom Aro, Kermit Balo, PA-C here for primary care  Current Health Care Team:  No dentist, has full dentures  Sees Eye doctor  Dr. Sharyn Lull, cardiology  Dr. Jaynie Collins, gynecology  Dr. Romero Belling, endocrinology  Dr. Dolores Frame, psychiatry  Medical Services you may have received from other than [facility] providers in the past year (date may be approximate) psychiatry  Exercise Current exercise habits: limited walking   Nutrition/Diet Current diet: vareity of foods  Depression Screen Depression screen [facility] 2/9 07/01/2016  Decreased Interest 0  Down, Depressed, Hopeless 0  PHQ - 2 Score 0  Altered sleeping -  Tired, decreased energy -  Change in appetite -  Feeling bad or failure about yourself  -  Trouble concentrating -  Moving slowly or fidgety/restless -  Suicidal thoughts -  PHQ-9 Score -  Difficult doing work/chores -    Activities of Daily Living Screen/Functional Status Survey Is the patient deaf or have difficulty hearing?: No Does the patient have difficulty seeing, even when wearing glasses/contacts?: Yes Does the patient have difficulty concentrating, remembering, or making decisions?: Yes (making decisions , has an aide) Does the patient have difficulty walking or climbing stairs?: Yes Does the patient have difficulty dressing or bathing?: Yes (she as an aide that help her ) Does the patient have difficulty doing errands alone such as visiting a doctor's office or shopping?: Yes (her aide helps)  Fall Risk Screen Fall Risk  07/01/2016 07/01/2016 11/26/2014 11/26/2014 11/12/2014  Falls in the past year? No No No Yes Yes  Number falls in past yr: - - - 2 or more 2 or more  Injury with Fall? - - - No No  Risk Factor Category  - - - - High Fall Risk  Risk for fall due to : - - - Impaired  mobility -  Follow up - - - Falls prevention discussed Falls prevention discussed    Gait Assessment: Normal gait observed slow, but otherwise fine  Advanced directives Does patient have a Health Care Power of Attorney? No Does patient have a Living Will? No  Past Medical History:  Diagnosis Date  . Alopecia   . Anemia   . Anxiety   . Bipolar 1 disorder (HCC)   . COPD (chronic obstructive pulmonary disease) (HCC)   . Coronary artery disease   . Depression   . Diabetes mellitus 2010  . Gastritis   . GERD (gastroesophageal reflux disease)   . Headache    migraines  . Hypertension 2010  . Obesity   . Pneumonia   . PONV (postoperative nausea and vomiting)   . PTSD (post-traumatic stress disorder)   . Schizophrenia (HCC)   . Tobacco use     Past Surgical History:  Procedure Laterality Date  . ABDOMINAL HYSTERECTOMY    . CARDIAC CATHETERIZATION    . CHOLECYSTECTOMY    . COLONOSCOPY  05/14/14   hemorrhoids, otherwise normal; Dr. Charlott Rakes  . COLONOSCOPY WITH PROPOFOL N/A 05/14/2014   Procedure: COLONOSCOPY WITH PROPOFOL;  Surgeon: Charlott Rakes, MD;  Location: [facility] ENDOSCOPY;  Service: Endoscopy;  Laterality: N/A;  . ESOPHAGOGASTRODUODENOSCOPY (EGD) WITH PROPOFOL N/A 05/14/2014   Procedure: ESOPHAGOGASTRODUODENOSCOPY (EGD) WITH PROPOFOL;  Surgeon: Charlott Rakes, MD;  Location: [facility] ENDOSCOPY;  Service: Endoscopy;  Laterality: N/A;  . FRACTURE SURGERY     left  leg  . LEG SURGERY    . TUBAL LIGATION      Social History   Social History  . Marital status: Divorced    Spouse name: N/A  . Number of children: N/A  . Years of education: N/A   Occupational History  . Not on file.   Social History Main Topics  . Smoking status: Light Tobacco Smoker    Last attempt to quit: 12/18/2013  . Smokeless tobacco: Never Used  . Alcohol use No  . Drug use: No  . Sexual activity: Not on file   Other Topics Concern  . Not on file   Social History Narrative  . No  narrative on file    Family History  Problem Relation Age of Onset  . Diabetes Mother   . Kidney failure Mother   . Diabetes Other   . Cancer Other   . Hypertension Other      Current Outpatient Prescriptions:  .  ALPRAZolam (XANAX) 1 MG tablet, Take 1 mg by mouth 3 (three) times daily as needed. , Disp: , Rfl: 0 .  amLODipine (NORVASC) 10 MG tablet, Take 1 tablet (10 mg total) by mouth daily., Disp: 90 tablet, Rfl: 3 .  atenolol (TENORMIN) 50 MG tablet, Take 1 tablet (50 mg total) by mouth 2 (two) times daily., Disp: 180 tablet, Rfl: 3 .  benztropine (COGENTIN) 0.5 MG tablet, Take 0.5 mg by mouth 2 (two) times daily. Reported on 04/20/2015, Disp: , Rfl:  .  busPIRone (BUSPAR) 15 MG tablet, Take 15 mg by mouth 2 (two) times daily. Reported on 04/20/2015, Disp: , Rfl:  .  diphenhydrAMINE (BENADRYL) 25 mg capsule, Take 50 mg by mouth at bedtime. Reported on 04/07/2015, Disp: , Rfl:  .  EPINEPHrine (EPIPEN 2-PAK) 0.3 mg/0.3 mL IJ SOAJ injection, Inject 0.3 mLs (0.3 mg total) into the muscle once., Disp: 1 Device, Rfl: 2 .  FANAPT 6 MG TABS, , Disp: , Rfl: 0 .  ferrous sulfate 325 (65 FE) MG tablet, Take 1 tablet (325 mg total) by mouth 2 (two) times daily with a meal., Disp: 180 tablet, Rfl: 0 .  FLUoxetine (PROZAC) 20 MG capsule, Take 20 mg by mouth daily., Disp: , Rfl: 0 .  Fluticasone-Salmeterol (ADVAIR DISKUS) 250-50 MCG/DOSE AEPB, Inhale 1 puff into the lungs 2 (two) times daily., Disp: 60 each, Rfl: 5 .  fluvoxaMINE (LUVOX) 25 MG tablet, Take 25 mg by mouth 2 (two) times daily., Disp: , Rfl:  .  furosemide (LASIX) 20 MG tablet, Take 1 tablet (20 mg total) by mouth daily., Disp: 90 tablet, Rfl: 3 .  glucose blood test strip, 1 each by Other route 3 (three) times daily. Use as instructed, Disp: 100 each, Rfl: 5 .  insulin degludec (TRESIBA FLEXTOUCH) 100 UNIT/ML SOPN FlexTouch Pen, Inject 0.3 mLs (30 Units total) into the skin daily., Disp: 3 mL, Rfl: 5 .  insulin lispro (HUMALOG KWIKPEN)  100 UNIT/ML KiwkPen, Inject 0.25 mLs (25 Units total) into the skin 3 (three) times daily., Disp: 30 mL, Rfl: 5 .  INVEGA TRINZA 819 MG/2.625ML SUSP, , Disp: , Rfl: 0 .  metFORMIN (GLUCOPHAGE XR) 500 MG 24 hr tablet, 2 tablets po daily, Disp: 180 tablet, Rfl: 3 .  nitroGLYCERIN (NITROSTAT) 0.4 MG SL tablet, Place 0.4 mg under the tongue every 5 (five) minutes as needed for chest pain. Reported on 07/15/2015, Disp: , Rfl:  .  NOVOFINE PLUS 32G X 4 MM MISC, USE TWICE DAILY, Disp: 100 each,  Rfl: 5 .  olmesartan (BENICAR) 40 MG tablet, Take 1 tablet (40 mg total) by mouth daily., Disp: 90 tablet, Rfl: 3 .  pantoprazole (PROTONIX) 40 MG tablet, take 1 tablet by mouth twice a day, Disp: 40 tablet, Rfl: 1 .  rosuvastatin (CRESTOR) 10 MG tablet, Take 1 tablet (10 mg total) by mouth daily., Disp: 90 tablet, Rfl: 3 .  zolpidem (AMBIEN) 10 MG tablet, Take 1 tablet by mouth daily., Disp: , Rfl: 0 .  albuterol (PROVENTIL HFA;VENTOLIN HFA) 108 (90 Base) MCG/ACT inhaler, Inhale 2 puffs into the lungs every 6 (six) hours as needed for wheezing. For wheezing (Patient not taking: Reported on 07/01/2016), Disp: 18 g, Rfl: 1  Allergies  Allergen Reactions  . Dexilant [Dexlansoprazole] Other (See Comments)    Chest pain, sob, diarrhea, abd pain, vomiting  . Famotidine Anaphylaxis  . Meperidine Hcl Anaphylaxis  . Dilaudid [Hydromorphone Hcl]     Change in mental state  . Gabapentin Other (See Comments)    Memory loss  . Hydrocodone Nausea And Vomiting  . Ramipril     angioedema  . Ziprasidone Hcl Other (See Comments)    Causing her to convulse  . Invokana [Canagliflozin] Rash  . Iohexol Itching and Rash  . Tape Rash    Use paper tape only     History reviewed: allergies, current medications, past family history, past medical history, past social history, past surgical history and problem list  Chronic issues discussed: HTN - compliant with medication without c/o Diabetes - managed by endocrinology  currently Depression, schizophrenia - managed by psychiatry  Acute issues discussed: none  Objective:      Biometrics BP 124/78   Pulse 75   Resp 10   Ht 5\' 4"  (1.626 m)   Wt 220 lb (99.8 kg)   SpO2 97%   BMI 37.76 kg/m   Cognitive Testing  Alert? Yes  Normal Appearance?Yes  Oriented to person? Yes  Place? Yes   Time? Yes  Recall of three objects?  Yes  Can perform simple calculations? Yes  Displays appropriate judgment?Yes  Can read the correct time from a watch face?Yes  General appearance: alert, no distress, WD/WN, AAfemale  Nutritional Status:  Inadequate calore intake? no Loss of muscle mass? no Loss of fat beneath skin? no Localized or general edema? no Diminished functional status? yes  Other pertinent exam: HEENT: normocephalic, sclerae anicteric, TMs pearly, nares patent, no discharge or erythema, pharynx normal Oral cavity: MMM, no lesions Neck: supple, no lymphadenopathy, no thyromegaly, no masses Heart: RRR, normal S1, S2, no murmurs Lungs: CTA bilaterally, no wheezes, rhonchi, or rales Abdomen: +bs, soft, non tender, non distended, no masses, no hepatomegaly, no splenomegaly Musculoskeletal: non tender, no swelling, no obvious deformity Extremities: no edema, no cyanosis, no clubbing Pulses: 2+ symmetric, upper and lower extremities, normal cap refill Neurological: alert, oriented x 3, CN2-12 intact, strength normal upper extremities and lower extremities, sensation normal throughout, DTRs 2+ throughout, no cerebellar signs, gait normal Psychiatric: flat affect, she is more unemotional today, seems numbed with her psychiatric medications, behavior normal otherwise, pleasant    Assessment:   Encounter Diagnoses  Name Primary?  . Medicare annual wellness visit, subsequent Yes  . Essential hypertension   . Gastroesophageal reflux disease without esophagitis   . Uncontrolled type 2 diabetes mellitus with diabetic neuropathy, with long-term current  use of insulin (HCC)   . Angiotensin converting enzyme inhibitor-aggravated angioedema, subsequent encounter   . Iron deficiency anemia, unspecified iron deficiency anemia  type   . Bipolar affective disorder, remission status unspecified (HCC)   . Generalized anxiety disorder   . Decreased activities of daily living (ADL)   . Hyperlipidemia, unspecified hyperlipidemia type   . MENOPAUSE, SURGICAL   . LEG CRAMPS   . Influenza vaccine refused   . TOBACCO ABUSE   . Schizoaffective disorder, bipolar type (HCC)   . POST TRAUMATIC STRESS SYNDROME   . High risk medication use   . Vaccine counseling   . Incontinence in female      Plan:   A preventative services visit was completed today.  During the course of the visit today, we discussed and counseled about appropriate screening and preventive services.  A health risk assessment was established today that included a review of current medications, allergies, social history, family history, medical and preventative health history, biometrics, and preventative screenings to identify potential safety concerns or impairments.  A personalized plan was printed today for your records and use.   Personalized health advice and education was given today to reduce health risks and promote self management and wellness.  Information regarding end of life planning was discussed today.  Conditions/risks identified: ADLs Mental health problems Nutrition  Chronic problems discussed today: HTN - c/t same medication Diabetes - managed by endocrinology currently Anemia - labs today Depression, schizophrenia - managed by psychiatry Obesity - discussed goals, work on exercise, healthy diet She continues to smoke, no plan to stop at current  Acute problems discussed today: none  Recommendations:  I recommend a yearly ophthalmology/optometry visit for glaucoma screening and eye checkup  I recommended a yearly dental visit for hygiene and checkup  I  recommend a screening mammogram every 1-2 years  Referrals today: none  Immunizations: I recommended a yearly influenza vaccine, typically in September when the vaccine is usually available Is the Pneumococcal vaccine up to date: no. Is the Shingles vaccine up to date: no.   Is the Td/Tdap vaccine up to date: yes. I recommended she check insurance coverage for pneumococcal and shingles vaccines.   Bona was seen today for medicare wellness.  Diagnoses and all orders for this visit:  Medicare annual wellness visit, subsequent  Essential hypertension -     Comprehensive metabolic panel -     CBC -     Lipid panel -     Microalbumin / creatinine urine ratio  Gastroesophageal reflux disease without esophagitis  Uncontrolled type 2 diabetes mellitus with diabetic neuropathy, with long-term current use of insulin (HCC)  Angiotensin converting enzyme inhibitor-aggravated angioedema, subsequent encounter  Iron deficiency anemia, unspecified iron deficiency anemia type -     CBC -     Iron and TIBC  Bipolar affective disorder, remission status unspecified (HCC)  Generalized anxiety disorder  Decreased activities of daily living (ADL)  Hyperlipidemia, unspecified hyperlipidemia type -     Lipid panel  MENOPAUSE, SURGICAL  LEG CRAMPS  Influenza vaccine refused  TOBACCO ABUSE  Schizoaffective disorder, bipolar type (HCC)  POST TRAUMATIC STRESS SYNDROME  High risk medication use  Vaccine counseling  Incontinence in female     Medicare Attestation A preventative services visit was completed today.  During the course of the visit the patient was educated and counseled about appropriate screening and preventive services.  A health risk assessment was established with the patient that included a review of current medications, allergies, social history, family history, medical and preventative health history, biometrics, and preventative screenings to identify potential  safety concerns or impairments.  A personalized plan was printed today for the patient's records and use.   Personalized health advice and education was given today to reduce health risks and promote self management and wellness.  Information regarding end of life planning was discussed today.  Ernst BreachYSINGER, Lakasha Mcfall SHANE, PA-C   07/01/2016

## 2016-07-02 LAB — COMPREHENSIVE METABOLIC PANEL
ALT: 9 U/L (ref 6–29)
AST: 10 U/L (ref 10–35)
Albumin: 3.5 g/dL — ABNORMAL LOW (ref 3.6–5.1)
Alkaline Phosphatase: 106 U/L (ref 33–130)
BILIRUBIN TOTAL: 0.2 mg/dL (ref 0.2–1.2)
BUN: 7 mg/dL (ref 7–25)
CALCIUM: 8.8 mg/dL (ref 8.6–10.4)
CO2: 25 mmol/L (ref 20–31)
Chloride: 103 mmol/L (ref 98–110)
Creat: 0.92 mg/dL (ref 0.50–1.05)
GLUCOSE: 168 mg/dL — AB (ref 65–99)
Potassium: 4.4 mmol/L (ref 3.5–5.3)
SODIUM: 138 mmol/L (ref 135–146)
Total Protein: 6.8 g/dL (ref 6.1–8.1)

## 2016-07-02 LAB — LIPID PANEL
CHOL/HDL RATIO: 4 ratio (ref <5.0)
Cholesterol: 214 mg/dL — ABNORMAL HIGH (ref <200)
HDL: 53 mg/dL (ref >50)
LDL Cholesterol: 141 mg/dL — ABNORMAL HIGH (ref <100)
Triglycerides: 100 mg/dL (ref <150)
VLDL: 20 mg/dL (ref <30)

## 2016-07-02 LAB — MICROALBUMIN / CREATININE URINE RATIO
CREATININE, URINE: 169 mg/dL (ref 20–320)
Microalb Creat Ratio: 5 mcg/mg creat (ref <30)
Microalb, Ur: 0.9 mg/dL

## 2016-07-02 LAB — IRON AND TIBC
%SAT: 14 % (ref 11–50)
Iron: 44 ug/dL — ABNORMAL LOW (ref 45–160)
TIBC: 310 ug/dL (ref 250–450)
UIBC: 266 ug/dL (ref 125–400)

## 2016-07-04 ENCOUNTER — Other Ambulatory Visit: Payer: Self-pay | Admitting: Medical

## 2016-07-04 MED ORDER — OLMESARTAN MEDOXOMIL 40 MG PO TABS: 40.0000 mg | ORAL_TABLET | Freq: Every day | ORAL | 3 refills | Status: DC

## 2016-07-04 MED ORDER — ATENOLOL 50 MG PO TABS: 50.0000 mg | ORAL_TABLET | Freq: Two times a day (BID) | ORAL | 3 refills | Status: DC

## 2016-07-04 MED ORDER — FUROSEMIDE 20 MG PO TABS: 20.0000 mg | ORAL_TABLET | Freq: Every day | ORAL | 3 refills | Status: DC

## 2016-07-04 MED ORDER — FERROUS SULFATE 325 (65 FE) MG PO TABS: 325.0000 mg | ORAL_TABLET | Freq: Every day | ORAL | 3 refills | Status: DC

## 2016-07-04 MED ORDER — AMLODIPINE BESYLATE 10 MG PO TABS: 10.0000 mg | ORAL_TABLET | Freq: Every day | ORAL | 3 refills | Status: DC

## 2016-07-04 MED ORDER — ROSUVASTATIN CALCIUM 20 MG PO TABS: 20.0000 mg | ORAL_TABLET | Freq: Every day | ORAL | 3 refills | Status: DC

## 2016-08-22 DIAGNOSIS — F250 Schizoaffective disorder, bipolar type: Secondary | ICD-10-CM | POA: Diagnosis not present

## 2016-08-22 DIAGNOSIS — Z79899 Other long term (current) drug therapy: Secondary | ICD-10-CM | POA: Diagnosis not present

## 2016-09-30 DIAGNOSIS — F200 Paranoid schizophrenia: Secondary | ICD-10-CM | POA: Diagnosis not present

## 2016-09-30 DIAGNOSIS — Z79899 Other long term (current) drug therapy: Secondary | ICD-10-CM | POA: Diagnosis not present

## 2016-10-04 ENCOUNTER — Encounter: Payer: Self-pay | Admitting: Medical

## 2016-10-04 ENCOUNTER — Ambulatory Visit (INDEPENDENT_AMBULATORY_CARE_PROVIDER_SITE_OTHER): Payer: Medicare Other | Admitting: Medical

## 2016-10-04 VITALS — BP 126/80 | HR 71 | Wt 218.8 lb

## 2016-10-04 DIAGNOSIS — I10 Essential (primary) hypertension: Secondary | ICD-10-CM | POA: Diagnosis not present

## 2016-10-04 DIAGNOSIS — E1165 Type 2 diabetes mellitus with hyperglycemia: Secondary | ICD-10-CM | POA: Diagnosis not present

## 2016-10-04 DIAGNOSIS — R6889 Other general symptoms and signs: Secondary | ICD-10-CM | POA: Diagnosis not present

## 2016-10-04 DIAGNOSIS — Z659 Problem related to unspecified psychosocial circumstances: Secondary | ICD-10-CM | POA: Diagnosis not present

## 2016-10-04 DIAGNOSIS — E785 Hyperlipidemia, unspecified: Secondary | ICD-10-CM | POA: Diagnosis not present

## 2016-10-04 DIAGNOSIS — R419 Unspecified symptoms and signs involving cognitive functions and awareness: Secondary | ICD-10-CM

## 2016-10-04 DIAGNOSIS — E1140 Type 2 diabetes mellitus with diabetic neuropathy, unspecified: Secondary | ICD-10-CM | POA: Diagnosis not present

## 2016-10-04 DIAGNOSIS — Z794 Long term (current) use of insulin: Secondary | ICD-10-CM

## 2016-10-04 DIAGNOSIS — Z79899 Other long term (current) drug therapy: Secondary | ICD-10-CM | POA: Diagnosis not present

## 2016-10-04 DIAGNOSIS — IMO0002 Reserved for concepts with insufficient information to code with codable children: Secondary | ICD-10-CM

## 2016-10-04 DIAGNOSIS — F411 Generalized anxiety disorder: Secondary | ICD-10-CM | POA: Diagnosis not present

## 2016-10-04 DIAGNOSIS — Z789 Other specified health status: Secondary | ICD-10-CM

## 2016-10-04 DIAGNOSIS — F250 Schizoaffective disorder, bipolar type: Secondary | ICD-10-CM

## 2016-10-04 DIAGNOSIS — K219 Gastro-esophageal reflux disease without esophagitis: Secondary | ICD-10-CM | POA: Diagnosis not present

## 2016-10-04 DIAGNOSIS — Z2821 Immunization not carried out because of patient refusal: Secondary | ICD-10-CM

## 2016-10-04 DIAGNOSIS — F319 Bipolar disorder, unspecified: Secondary | ICD-10-CM

## 2016-10-04 LAB — LIPID PANEL
CHOL/HDL RATIO: 3.6 ratio (ref <5.0)
CHOLESTEROL: 202 mg/dL — AB (ref <200)
HDL: 56 mg/dL (ref >50)
LDL Cholesterol: 125 mg/dL — ABNORMAL HIGH (ref <100)
TRIGLYCERIDES: 106 mg/dL (ref <150)
VLDL: 21 mg/dL (ref <30)

## 2016-10-04 LAB — COMPREHENSIVE METABOLIC PANEL
ALBUMIN: 3.5 g/dL — AB (ref 3.6–5.1)
ALT: 9 U/L (ref 6–29)
AST: 10 U/L (ref 10–35)
Alkaline Phosphatase: 98 U/L (ref 33–130)
BUN: 6 mg/dL — ABNORMAL LOW (ref 7–25)
CHLORIDE: 104 mmol/L (ref 98–110)
CO2: 22 mmol/L (ref 20–32)
Calcium: 8.9 mg/dL (ref 8.6–10.4)
Creat: 0.9 mg/dL (ref 0.50–1.05)
Glucose, Bld: 201 mg/dL — ABNORMAL HIGH (ref 65–99)
POTASSIUM: 4 mmol/L (ref 3.5–5.3)
SODIUM: 135 mmol/L (ref 135–146)
TOTAL PROTEIN: 6.5 g/dL (ref 6.1–8.1)
Total Bilirubin: 0.3 mg/dL (ref 0.2–1.2)

## 2016-10-04 LAB — CBC
HCT: 37.8 % (ref 35.0–45.0)
Hemoglobin: 12.2 g/dL (ref 11.7–15.5)
MCH: 27.2 pg (ref 27.0–33.0)
MCHC: 32.3 g/dL (ref 32.0–36.0)
MCV: 84.4 fL (ref 80.0–100.0)
MPV: 10.3 fL (ref 7.5–12.5)
PLATELETS: 313 10*3/uL (ref 140–400)
RBC: 4.48 MIL/uL (ref 3.80–5.10)
RDW: 14.8 % (ref 11.0–15.0)
WBC: 8.6 10*3/uL (ref 4.0–10.5)

## 2016-10-04 LAB — TSH: TSH: 1.27 mIU/L

## 2016-10-04 NOTE — Progress Notes (Signed)
Subjective: Chief Complaint  Patient presents with  . Follow-up    follow up on blood sugar , labs    Here for routine f/u.  No recent concerns.  Sees Dr. Penelope Galas for psychiatry monthly.  Has been using a new medication injected into her butt once monthly for mood.   Interactions with her family currently is ok.     Checking sugars TID.   Fasting numbers typically 180-238s.   Highest numbers 238.  hasnt' seen diabetes specialist since last visit here.  Taking [place] long acting insulin 40u QHS, using meal time insulin 30u in the morning only.    Doesn't skip meals, but relies on aid worker and family for lunch and dinner, eats bowl of cereal for breakfast.  Not using insulin at lunch or dinner.    Getting some walking in for exercise.  Has aid worker that comes out daily.   they help her with bathing, cooking.  They are typically there 2 hours.   Her kids come help some, they take turns coming out to help.     She has 4 children in [place], 8 grandchildren.   She lives by herself.    40u QHS, 30u am Humalog  Past Medical History:  Diagnosis Date  . Alopecia   . Anemia   . Anxiety   . Bipolar 1 disorder (HCC)   . COPD (chronic obstructive pulmonary disease) (HCC)   . Coronary artery disease   . Depression   . Diabetes mellitus 2010  . Gastritis   . GERD (gastroesophageal reflux disease)   . Headache    migraines  . Hypertension 2010  . Obesity   . Pneumonia   . PONV (postoperative nausea and vomiting)   . PTSD (post-traumatic stress disorder)   . Schizophrenia (HCC)   . Tobacco use    Current Outpatient Prescriptions on File Prior to Visit  Medication Sig Dispense Refill  . albuterol (PROVENTIL HFA;VENTOLIN HFA) 108 (90 Base) MCG/ACT inhaler Inhale 2 puffs into the lungs every 6 (six) hours as needed for wheezing. For wheezing 18 g 1  . ALPRAZolam (XANAX) 1 MG tablet Take 1 mg by mouth 3 (three) times daily as needed.   0  . amLODipine (NORVASC) 10 MG tablet Take 1  tablet (10 mg total) by mouth daily. 90 tablet 3  . atenolol (TENORMIN) 50 MG tablet Take 1 tablet (50 mg total) by mouth 2 (two) times daily. 180 tablet 3  . benztropine (COGENTIN) 0.5 MG tablet Take 0.5 mg by mouth 2 (two) times daily. Reported on 04/20/2015    . busPIRone (BUSPAR) 15 MG tablet Take 15 mg by mouth 2 (two) times daily. Reported on 04/20/2015    . diphenhydrAMINE (BENADRYL) 25 mg capsule Take 50 mg by mouth at bedtime. Reported on 04/07/2015    . EPINEPHrine (EPIPEN 2-PAK) 0.3 mg/0.3 mL IJ SOAJ injection Inject 0.3 mLs (0.3 mg total) into the muscle once. 1 Device 2  . FANAPT 6 MG TABS   0  . ferrous sulfate 325 (65 FE) MG tablet Take 1 tablet (325 mg total) by mouth daily with breakfast. 90 tablet 3  . FLUoxetine (PROZAC) 20 MG capsule Take 20 mg by mouth daily.  0  . Fluticasone-Salmeterol (ADVAIR DISKUS) 250-50 MCG/DOSE AEPB Inhale 1 puff into the lungs 2 (two) times daily. 60 each 5  . fluvoxaMINE (LUVOX) 25 MG tablet Take 25 mg by mouth 2 (two) times daily.    . furosemide (LASIX) 20 MG tablet  Take 1 tablet (20 mg total) by mouth daily. 90 tablet 3  . glucose blood test strip 1 each by Other route 3 (three) times daily. Use as instructed 100 each 5  . insulin degludec (TRESIBA FLEXTOUCH) 100 UNIT/ML SOPN FlexTouch Pen Inject 0.3 mLs (30 Units total) into the skin daily. 3 mL 5  . insulin lispro (HUMALOG KWIKPEN) 100 UNIT/ML KiwkPen Inject 0.25 mLs (25 Units total) into the skin 3 (three) times daily. 30 mL 5  . INVEGA TRINZA 819 MG/2.625ML SUSP   0  . metFORMIN (GLUCOPHAGE XR) 500 MG 24 hr tablet 2 tablets po daily 180 tablet 3  . nitroGLYCERIN (NITROSTAT) 0.4 MG SL tablet Place 0.4 mg under the tongue every 5 (five) minutes as needed for chest pain. Reported on 07/15/2015    . NOVOFINE PLUS 32G X 4 MM MISC USE TWICE DAILY 100 each 5  . olmesartan (BENICAR) 40 MG tablet Take 1 tablet (40 mg total) by mouth daily. 90 tablet 3  . pantoprazole (PROTONIX) 40 MG tablet take 1 tablet  by mouth twice a day 40 tablet 1  . zolpidem (AMBIEN) 10 MG tablet Take 1 tablet by mouth daily.  0  . rosuvastatin (CRESTOR) 20 MG tablet Take 1 tablet (20 mg total) by mouth daily. 90 tablet 3   No current facility-administered medications on file prior to visit.     ROS as in subjective  Objective: BP 126/80   Pulse 71   Wt 218 lb 12.8 oz (99.2 kg)   SpO2 97%   BMI 37.56 kg/m   Wt Readings from Last 3 Encounters:  10/04/16 218 lb 12.8 oz (99.2 kg)  07/01/16 220 lb (99.8 kg)  03/24/16 232 lb (105.2 kg)   BP Readings from Last 3 Encounters:  10/04/16 126/80  07/01/16 124/78  03/24/16 132/80   General appearance: alert, no distress, WD/WN,  Psych: flat affect, but answers questions, interacts when asked Oral cavity: MMM, no lesions Neck: supple, no lymphadenopathy, no thyromegaly, no masses Heart: RRR, normal S1, S2, no murmurs Lungs: CTA bilaterally, no wheezes, rhonchi, or rales Pulses: 2+ symmetric, upper and lower extremities, normal cap refill Ext: No edema   Diabetic Foot Exam - Simple   Simple Foot Form Diabetic Foot exam was performed with the following findings:  Yes 10/04/2016  9:06 AM  Visual Inspection No deformities, no ulcerations, no other skin breakdown bilaterally:  Yes Sensation Testing Intact to touch and monofilament testing bilaterally:  Yes Pulse Check Posterior Tibialis and Dorsalis pulse intact bilaterally:  Yes Comments    Assessment: Encounter Diagnoses  Name Primary?  Marland Kitchen Uncontrolled type 2 diabetes mellitus with diabetic neuropathy, with long-term current use of insulin (HCC) Yes  . Essential hypertension   . Gastroesophageal reflux disease without esophagitis   . 23-polyvalent pneumococcal polysaccharide vaccine declined   . Bipolar affective disorder, remission status unspecified (HCC)   . Hyperlipidemia, unspecified hyperlipidemia type   . High risk medication use   . Generalized anxiety disorder   . Decreased activities of daily  living (ADL)   . Cognitive safety issue   . Schizoaffective disorder, bipolar type (HCC)     Plan: She has significant mental health issues with schizoaffective disorder, depression and anxiety that has a big impact on her overall care.   Routine labs today  Given her high sugar readings, we will need to make some medication changes pending labs.  It is challenging to know if she is able to administer her medication correctly  or when it is suppose to be given.   She has refused to go to endocrinology as requested.  C/t same medications.  Her aid worker will continue to go out daily to help and her children help in the evenings.  She wishes to remain independent to some extent at home.  Has refused SNF or assisted living in the past.  Marylouise StacksWilma was seen today for follow-up.  Diagnoses and all orders for this visit:  Uncontrolled type 2 diabetes mellitus with diabetic neuropathy, with long-term current use of insulin (HCC) -     Comprehensive metabolic panel -     Hemoglobin A1c -     HM DIABETES EYE EXAM -     HM DIABETES FOOT EXAM  Essential hypertension -     Comprehensive metabolic panel -     Lipid panel  Gastroesophageal reflux disease without esophagitis  23-polyvalent pneumococcal polysaccharide vaccine declined  Bipolar affective disorder, remission status unspecified (HCC) -     TSH  Hyperlipidemia, unspecified hyperlipidemia type -     Comprehensive metabolic panel -     Lipid panel  High risk medication use -     CBC -     TSH  Generalized anxiety disorder  Decreased activities of daily living (ADL)  Cognitive safety issue  Schizoaffective disorder, bipolar type (HCC)

## 2016-10-05 ENCOUNTER — Other Ambulatory Visit: Payer: Self-pay | Admitting: Medical

## 2016-10-05 LAB — HEMOGLOBIN A1C
Hgb A1c MFr Bld: 8.3 % — ABNORMAL HIGH (ref <5.7)
Mean Plasma Glucose: 192 mg/dL

## 2016-10-05 MED ORDER — INSULIN LISPRO 100 UNIT/ML (KWIKPEN): 25.0000 [IU] | PEN_INJECTOR | Freq: Three times a day (TID) | SUBCUTANEOUS | 5 refills | Status: DC

## 2016-10-05 MED ORDER — INSULIN DEGLUDEC 100 UNIT/ML ~~LOC~~ SOPN: 45.0000 [IU] | PEN_INJECTOR | Freq: Every day | SUBCUTANEOUS | 5 refills | Status: DC

## 2016-10-05 MED ORDER — INSULIN PEN NEEDLE 32G X 4 MM MISC: 5 refills | Status: DC

## 2016-10-05 MED ORDER — METFORMIN HCL ER 500 MG PO TB24: ORAL_TABLET | ORAL | 3 refills | Status: DC

## 2016-10-05 MED ORDER — ALBUTEROL SULFATE HFA 108 (90 BASE) MCG/ACT IN AERS: 2.0000 | INHALATION_SPRAY | Freq: Four times a day (QID) | RESPIRATORY_TRACT | 1 refills | Status: DC | PRN

## 2016-10-05 MED ORDER — GLUCOSE BLOOD VI STRP: 1.0000 | ORAL_STRIP | Freq: Three times a day (TID) | 5 refills | Status: DC

## 2016-10-06 DIAGNOSIS — E119 Type 2 diabetes mellitus without complications: Secondary | ICD-10-CM | POA: Diagnosis not present

## 2016-10-06 DIAGNOSIS — Z9181 History of falling: Secondary | ICD-10-CM | POA: Diagnosis not present

## 2016-10-06 DIAGNOSIS — Z87891 Personal history of nicotine dependence: Secondary | ICD-10-CM | POA: Diagnosis not present

## 2016-10-06 DIAGNOSIS — R32 Unspecified urinary incontinence: Secondary | ICD-10-CM | POA: Diagnosis not present

## 2016-10-06 DIAGNOSIS — F411 Generalized anxiety disorder: Secondary | ICD-10-CM | POA: Diagnosis not present

## 2016-10-06 DIAGNOSIS — F250 Schizoaffective disorder, bipolar type: Secondary | ICD-10-CM | POA: Diagnosis not present

## 2016-10-06 DIAGNOSIS — Z794 Long term (current) use of insulin: Secondary | ICD-10-CM | POA: Diagnosis not present

## 2016-10-06 DIAGNOSIS — D509 Iron deficiency anemia, unspecified: Secondary | ICD-10-CM | POA: Diagnosis not present

## 2016-10-06 DIAGNOSIS — I10 Essential (primary) hypertension: Secondary | ICD-10-CM | POA: Diagnosis not present

## 2016-10-06 DIAGNOSIS — F4310 Post-traumatic stress disorder, unspecified: Secondary | ICD-10-CM | POA: Diagnosis not present

## 2016-10-06 DIAGNOSIS — E785 Hyperlipidemia, unspecified: Secondary | ICD-10-CM | POA: Diagnosis not present

## 2016-10-11 ENCOUNTER — Telehealth: Payer: Self-pay

## 2016-10-11 NOTE — Telephone Encounter (Signed)
Per shane  It okay for her to have 1 home health visit a week and prn  With [facility].

## 2016-10-13 ENCOUNTER — Telehealth: Payer: Self-pay

## 2016-10-13 DIAGNOSIS — E119 Type 2 diabetes mellitus without complications: Secondary | ICD-10-CM | POA: Diagnosis not present

## 2016-10-13 DIAGNOSIS — F411 Generalized anxiety disorder: Secondary | ICD-10-CM | POA: Diagnosis not present

## 2016-10-13 DIAGNOSIS — F250 Schizoaffective disorder, bipolar type: Secondary | ICD-10-CM | POA: Diagnosis not present

## 2016-10-13 DIAGNOSIS — I10 Essential (primary) hypertension: Secondary | ICD-10-CM | POA: Diagnosis not present

## 2016-10-13 DIAGNOSIS — F4310 Post-traumatic stress disorder, unspecified: Secondary | ICD-10-CM | POA: Diagnosis not present

## 2016-10-13 DIAGNOSIS — D509 Iron deficiency anemia, unspecified: Secondary | ICD-10-CM | POA: Diagnosis not present

## 2016-10-13 NOTE — Telephone Encounter (Signed)
Called and spoke with Nicolet with [facility] ,and gave her this information.

## 2016-10-13 NOTE — Telephone Encounter (Signed)
[facility] called to verify pt's insulin that she should be using. According to med list and Vincenza HewsShane last note pt is supposed to be on Tresiba and Humalog. Nurse called because she found Lantus in pt's refrigerator. She will dispose of the Lantus to prevent any further confusion in insulin that pt may have. Trixie Rude/RLB

## 2016-10-13 NOTE — Telephone Encounter (Signed)
   She should be using 45 u Tresbia at night time and 20u Humalog at each meal TID

## 2016-10-18 DIAGNOSIS — F4310 Post-traumatic stress disorder, unspecified: Secondary | ICD-10-CM | POA: Diagnosis not present

## 2016-10-18 DIAGNOSIS — D509 Iron deficiency anemia, unspecified: Secondary | ICD-10-CM | POA: Diagnosis not present

## 2016-10-18 DIAGNOSIS — I10 Essential (primary) hypertension: Secondary | ICD-10-CM | POA: Diagnosis not present

## 2016-10-18 DIAGNOSIS — E119 Type 2 diabetes mellitus without complications: Secondary | ICD-10-CM | POA: Diagnosis not present

## 2016-10-18 DIAGNOSIS — F250 Schizoaffective disorder, bipolar type: Secondary | ICD-10-CM | POA: Diagnosis not present

## 2016-10-18 DIAGNOSIS — F411 Generalized anxiety disorder: Secondary | ICD-10-CM | POA: Diagnosis not present

## 2016-10-21 DIAGNOSIS — Z79899 Other long term (current) drug therapy: Secondary | ICD-10-CM | POA: Diagnosis not present

## 2016-10-21 DIAGNOSIS — F250 Schizoaffective disorder, bipolar type: Secondary | ICD-10-CM | POA: Diagnosis not present

## 2016-10-26 DIAGNOSIS — F4310 Post-traumatic stress disorder, unspecified: Secondary | ICD-10-CM | POA: Diagnosis not present

## 2016-10-26 DIAGNOSIS — D509 Iron deficiency anemia, unspecified: Secondary | ICD-10-CM | POA: Diagnosis not present

## 2016-10-26 DIAGNOSIS — F411 Generalized anxiety disorder: Secondary | ICD-10-CM | POA: Diagnosis not present

## 2016-10-26 DIAGNOSIS — I10 Essential (primary) hypertension: Secondary | ICD-10-CM | POA: Diagnosis not present

## 2016-10-26 DIAGNOSIS — F250 Schizoaffective disorder, bipolar type: Secondary | ICD-10-CM | POA: Diagnosis not present

## 2016-10-26 DIAGNOSIS — E119 Type 2 diabetes mellitus without complications: Secondary | ICD-10-CM | POA: Diagnosis not present

## 2016-11-11 DIAGNOSIS — F250 Schizoaffective disorder, bipolar type: Secondary | ICD-10-CM | POA: Diagnosis not present

## 2016-11-11 DIAGNOSIS — F4310 Post-traumatic stress disorder, unspecified: Secondary | ICD-10-CM | POA: Diagnosis not present

## 2016-11-11 DIAGNOSIS — D509 Iron deficiency anemia, unspecified: Secondary | ICD-10-CM | POA: Diagnosis not present

## 2016-11-11 DIAGNOSIS — F411 Generalized anxiety disorder: Secondary | ICD-10-CM | POA: Diagnosis not present

## 2016-11-11 DIAGNOSIS — E119 Type 2 diabetes mellitus without complications: Secondary | ICD-10-CM | POA: Diagnosis not present

## 2016-11-11 DIAGNOSIS — I10 Essential (primary) hypertension: Secondary | ICD-10-CM | POA: Diagnosis not present

## 2016-11-14 ENCOUNTER — Telehealth: Payer: Self-pay | Admitting: Internal Medicine

## 2016-11-14 NOTE — Telephone Encounter (Signed)
Ezzie Dural with [facility] called wanting to get verbal orders for [facility] nursing for 1 time a week for 3 weeks for diease management. She also wanted to let us know that she states that pt is noncomplaint in diet choices, continues to smoke and forgetfulk about her blood sugars  [phone]-Nicoletta

## 2016-11-15 DIAGNOSIS — Z79899 Other long term (current) drug therapy: Secondary | ICD-10-CM | POA: Diagnosis not present

## 2016-11-15 DIAGNOSIS — F250 Schizoaffective disorder, bipolar type: Secondary | ICD-10-CM | POA: Diagnosis not present

## 2016-11-15 NOTE — Telephone Encounter (Signed)
Please approve home health

## 2016-11-16 NOTE — Telephone Encounter (Signed)
Called and gave verbal orders 

## 2016-11-25 DIAGNOSIS — E119 Type 2 diabetes mellitus without complications: Secondary | ICD-10-CM | POA: Diagnosis not present

## 2016-11-25 DIAGNOSIS — F4310 Post-traumatic stress disorder, unspecified: Secondary | ICD-10-CM | POA: Diagnosis not present

## 2016-11-25 DIAGNOSIS — I10 Essential (primary) hypertension: Secondary | ICD-10-CM | POA: Diagnosis not present

## 2016-11-25 DIAGNOSIS — F411 Generalized anxiety disorder: Secondary | ICD-10-CM | POA: Diagnosis not present

## 2016-11-25 DIAGNOSIS — F250 Schizoaffective disorder, bipolar type: Secondary | ICD-10-CM | POA: Diagnosis not present

## 2016-11-25 DIAGNOSIS — D509 Iron deficiency anemia, unspecified: Secondary | ICD-10-CM | POA: Diagnosis not present

## 2016-12-02 ENCOUNTER — Other Ambulatory Visit: Payer: Self-pay | Admitting: Medical

## 2016-12-02 DIAGNOSIS — Z1231 Encounter for screening mammogram for malignant neoplasm of breast: Secondary | ICD-10-CM

## 2016-12-06 ENCOUNTER — Telehealth: Payer: Self-pay | Admitting: Medical

## 2016-12-06 ENCOUNTER — Emergency Department (HOSPITAL_COMMUNITY): Payer: Medicare Other

## 2016-12-06 ENCOUNTER — Encounter (HOSPITAL_COMMUNITY): Payer: Self-pay

## 2016-12-06 ENCOUNTER — Emergency Department (HOSPITAL_COMMUNITY)
Admission: EM | Admit: 2016-12-06 | Discharge: 2016-12-07 | Disposition: A | Discharge status: Home / Self Care | Payer: Medicare Other | Attending: Emergency Medicine | Admitting: Emergency Medicine

## 2016-12-06 DIAGNOSIS — R5383 Other fatigue: Secondary | ICD-10-CM | POA: Diagnosis not present

## 2016-12-06 DIAGNOSIS — R410 Disorientation, unspecified: Secondary | ICD-10-CM | POA: Insufficient documentation

## 2016-12-06 DIAGNOSIS — F17200 Nicotine dependence, unspecified, uncomplicated: Secondary | ICD-10-CM | POA: Diagnosis not present

## 2016-12-06 DIAGNOSIS — I2510 Atherosclerotic heart disease of native coronary artery without angina pectoris: Secondary | ICD-10-CM | POA: Diagnosis not present

## 2016-12-06 DIAGNOSIS — I10 Essential (primary) hypertension: Secondary | ICD-10-CM | POA: Insufficient documentation

## 2016-12-06 DIAGNOSIS — Z794 Long term (current) use of insulin: Secondary | ICD-10-CM | POA: Insufficient documentation

## 2016-12-06 DIAGNOSIS — E1165 Type 2 diabetes mellitus with hyperglycemia: Secondary | ICD-10-CM | POA: Diagnosis not present

## 2016-12-06 DIAGNOSIS — R531 Weakness: Secondary | ICD-10-CM | POA: Diagnosis not present

## 2016-12-06 DIAGNOSIS — Z79899 Other long term (current) drug therapy: Secondary | ICD-10-CM | POA: Diagnosis not present

## 2016-12-06 DIAGNOSIS — I517 Cardiomegaly: Secondary | ICD-10-CM | POA: Diagnosis not present

## 2016-12-06 DIAGNOSIS — L03313 Cellulitis of chest wall: Secondary | ICD-10-CM | POA: Diagnosis not present

## 2016-12-06 DIAGNOSIS — R9431 Abnormal electrocardiogram [ECG] [EKG]: Secondary | ICD-10-CM | POA: Diagnosis not present

## 2016-12-06 DIAGNOSIS — J449 Chronic obstructive pulmonary disease, unspecified: Secondary | ICD-10-CM | POA: Diagnosis not present

## 2016-12-06 DIAGNOSIS — Z9189 Other specified personal risk factors, not elsewhere classified: Secondary | ICD-10-CM | POA: Diagnosis not present

## 2016-12-06 DIAGNOSIS — R739 Hyperglycemia, unspecified: Secondary | ICD-10-CM

## 2016-12-06 LAB — BASIC METABOLIC PANEL
ANION GAP: 12 (ref 5–15)
BUN: 9 mg/dL (ref 6–20)
CALCIUM: 9 mg/dL (ref 8.9–10.3)
CO2: 21 mmol/L — AB (ref 22–32)
Chloride: 99 mmol/L — ABNORMAL LOW (ref 101–111)
Creatinine, Ser: 0.93 mg/dL (ref 0.44–1.00)
Glucose, Bld: 285 mg/dL — ABNORMAL HIGH (ref 65–99)
POTASSIUM: 4.1 mmol/L (ref 3.5–5.1)
Sodium: 132 mmol/L — ABNORMAL LOW (ref 135–145)

## 2016-12-06 LAB — URINALYSIS, ROUTINE W REFLEX MICROSCOPIC
BILIRUBIN URINE: NEGATIVE
KETONES UR: NEGATIVE mg/dL
Leukocytes, UA: NEGATIVE
NITRITE: NEGATIVE
PROTEIN: NEGATIVE mg/dL
Specific Gravity, Urine: 1.02 (ref 1.005–1.030)
pH: 6 (ref 5.0–8.0)

## 2016-12-06 LAB — RAPID URINE DRUG SCREEN, HOSP PERFORMED
AMPHETAMINES: NOT DETECTED
BARBITURATES: NOT DETECTED
BENZODIAZEPINES: POSITIVE — AB
Cocaine: NOT DETECTED
Opiates: NOT DETECTED
TETRAHYDROCANNABINOL: NOT DETECTED

## 2016-12-06 LAB — I-STAT VENOUS BLOOD GAS, ED
ACID-BASE EXCESS: 1 mmol/L (ref 0.0–2.0)
BICARBONATE: 26.6 mmol/L (ref 20.0–28.0)
O2 Saturation: 46 %
TCO2: 28 mmol/L (ref 22–32)
pCO2, Ven: 46.3 mmHg (ref 44.0–60.0)
pH, Ven: 7.367 (ref 7.250–7.430)
pO2, Ven: 26 mmHg — CL (ref 32.0–45.0)

## 2016-12-06 LAB — CBC
HEMATOCRIT: 37.8 % (ref 36.0–46.0)
HEMOGLOBIN: 12.6 g/dL (ref 12.0–15.0)
MCH: 26.6 pg (ref 26.0–34.0)
MCHC: 33.3 g/dL (ref 30.0–36.0)
MCV: 79.9 fL (ref 78.0–100.0)
Platelets: 278 10*3/uL (ref 150–400)
RBC: 4.73 MIL/uL (ref 3.87–5.11)
RDW: 13.9 % (ref 11.5–15.5)
WBC: 8.2 10*3/uL (ref 4.0–10.5)

## 2016-12-06 LAB — CBG MONITORING, ED
Glucose-Capillary: 281 mg/dL — ABNORMAL HIGH (ref 65–99)
Glucose-Capillary: 173 mg/dL — ABNORMAL HIGH (ref 65–99)

## 2016-12-06 LAB — BETA-HYDROXYBUTYRIC ACID

## 2016-12-06 MED ORDER — ACETAMINOPHEN 500 MG PO TABS
1000.0000 mg | ORAL_TABLET | Freq: Once | ORAL | Status: DC
  Filled 2016-12-06: qty 2

## 2016-12-06 NOTE — ED Notes (Signed)
CBG is 173

## 2016-12-06 NOTE — ED Notes (Signed)
CBG 281; RN notified

## 2016-12-06 NOTE — ED Notes (Signed)
ED Provider at bedside for re-evaluation 

## 2016-12-06 NOTE — ED Notes (Signed)
Dr. Mesner at bedside   

## 2016-12-06 NOTE — ED Triage Notes (Signed)
[facility]- pt coming from home, CBG at home was 469. EMS got 369. Pt had slurred speech yesterday and generalized weakness. FAST test negative per EMS. Strong grips noted bilaterally. 12 lead WNL. 20g RAC.  EMS vitals: 172/96, 84bpm, SPO2 100%

## 2016-12-06 NOTE — Telephone Encounter (Signed)
A relative of the patient called stating that they are having difficulty waking the patient and her sugar reading is 469. She has been sleeping a lot over the last few days and difficult to wake. Spoke to her PCP's CMA and advised the family to take the patient to the hospital. Call 911 for an ambulance if they are not able to get patient aroused enough to get mobile. Family understood.

## 2016-12-06 NOTE — ED Triage Notes (Signed)
Pt reports generalized weakness and fatigue that began yesterday. Blood sugar has been running high. Pt is AOX4 but does have some slow speech noted. Skin warm and dry, hypertensive in triage.

## 2016-12-06 NOTE — ED Notes (Signed)
Pt taken to CT.

## 2016-12-06 NOTE — ED Provider Notes (Signed)
[facility]-EMERGENCY DEPT Provider Note   CSN: [id number] Arrival date & time: 12/06/16  1543  History   Chief Complaint Chief Complaint  Patient presents with  . Hyperglycemia   HPI Cheyenne Alvarez is a 51 y.o. female.  The patient is a 52yo female with a past medical history significant for COPD, CAD, T2DM, schizophrenia, PTSD, and bipolar disorder, who presents to the ED with one day of  malaise and fatigue.  [name] blood glucose level has been high for the past two days.  Her daughters report excessive sleepiness and intermittent shakiness, as well as poor oral intake. This morning, the patient's daughter called her at 6 AM and her mother did not answer the phone. She and her sister then proceeded to go to the patient's house, at which time they noticed confusion and slowing of her speech.  No fevers or chills.  No cough, abdominal pain, or urinary symptoms.  Her daughters also report an unsteady gait that has gradually worsened over the past several months.   The history is provided by the patient, a relative and medical records. No language interpreter was used.   Past Medical History:  Diagnosis Date  . Alopecia   . Anemia   . Anxiety   . Bipolar 1 disorder (HCC)   . COPD (chronic obstructive pulmonary disease) (HCC)   . Coronary artery disease   . Depression   . Diabetes mellitus 2010  . Gastritis   . GERD (gastroesophageal reflux disease)   . Headache    migraines  . Hypertension 2010  . Obesity   . Pneumonia   . PONV (postoperative nausea and vomiting)   . PTSD (post-traumatic stress disorder)   . Schizophrenia (HCC)   . Tobacco use     Patient Active Problem List   Diagnosis Date Noted  . Medicare annual wellness visit, subsequent 07/01/2016  . High risk medication use 07/01/2016  . Vaccine counseling 07/01/2016  . Incontinence in female 07/01/2016  . Ovarian cyst 09/28/2015  . Anemia, iron deficiency 06/15/2015  . Hyperlipidemia 06/15/2015  .  Pneumococcal vaccination declined 06/15/2015  . Decreased activities of daily living (ADL) 03/23/2015  . Cognitive safety issue 03/23/2015  . 23-polyvalent pneumococcal polysaccharide vaccine declined 03/23/2015  . Influenza vaccine refused 03/23/2015  . Cramping of feet 03/23/2015  . Schizoaffective disorder, bipolar type (HCC)   . Diabetes type 2, uncontrolled (HCC) 07/08/2014  . Essential hypertension 07/08/2014  . ACE inhibitor-aggravated angioedema 08/14/2012  . OBESITY 08/03/2009  . TOBACCO ABUSE 08/03/2009  . BIPOLAR AFFECTIVE DISORDER 04/06/2009  . Generalized anxiety disorder 04/06/2009  . POST TRAUMATIC STRESS SYNDROME 04/06/2009  . GERD 04/06/2009  . MENOPAUSE, SURGICAL 04/06/2009  . LEG CRAMPS 04/06/2009  . CHOLECYSTECTOMY, HX OF 04/06/2009    Past Surgical History:  Procedure Laterality Date  . ABDOMINAL HYSTERECTOMY    . CARDIAC CATHETERIZATION    . CHOLECYSTECTOMY    . COLONOSCOPY  05/14/14   hemorrhoids, otherwise normal; Dr. Charlott Rakes  . COLONOSCOPY WITH PROPOFOL N/A 05/14/2014   Procedure: COLONOSCOPY WITH PROPOFOL;  Surgeon: Charlott Rakes, MD;  Location: [facility] ENDOSCOPY;  Service: Endoscopy;  Laterality: N/A;  . ESOPHAGOGASTRODUODENOSCOPY (EGD) WITH PROPOFOL N/A 05/14/2014   Procedure: ESOPHAGOGASTRODUODENOSCOPY (EGD) WITH PROPOFOL;  Surgeon: Charlott Rakes, MD;  Location: [facility] ENDOSCOPY;  Service: Endoscopy;  Laterality: N/A;  . FRACTURE SURGERY     left leg  . LEG SURGERY    . TUBAL LIGATION      OB History    No  data available       Home Medications    Prior to Admission medications   Medication Sig Start Date End Date Taking? Authorizing Provider  albuterol (PROVENTIL HFA;VENTOLIN HFA) 108 (90 Base) MCG/ACT inhaler Inhale 2 puffs into the lungs every 6 (six) hours as needed for wheezing. For wheezing 10/05/16   Tysinger, Kermit Balo, PA-C  ALPRAZolam Prudy Feeler) 1 MG tablet Take 1 mg by mouth 3 (three) times daily as needed.  09/26/15   [provider]  amLODipine (NORVASC) 10 MG tablet Take 1 tablet (10 mg total) by mouth daily. 07/04/16   Tysinger, Kermit Balo, PA-C  atenolol (TENORMIN) 50 MG tablet Take 1 tablet (50 mg total) by mouth 2 (two) times daily. 07/04/16   Tysinger, Kermit Balo, PA-C  benztropine (COGENTIN) 0.5 MG tablet Take 0.5 mg by mouth 2 (two) times daily. Reported on 04/20/2015    [provider]  busPIRone (BUSPAR) 15 MG tablet Take 15 mg by mouth 2 (two) times daily. Reported on 04/20/2015    [provider]  diphenhydrAMINE (BENADRYL) 25 mg capsule Take 50 mg by mouth at bedtime. Reported on 04/07/2015    [provider]  EPINEPHrine (EPIPEN 2-PAK) 0.3 mg/0.3 mL IJ SOAJ injection Inject 0.3 mLs (0.3 mg total) into the muscle once. 03/13/14   Purvis Sheffield, MD  FANAPT 6 MG TABS  04/01/15   [provider]  ferrous sulfate 325 (65 FE) MG tablet Take 1 tablet (325 mg total) by mouth daily with breakfast. 07/04/16   Tysinger, Kermit Balo, PA-C  FLUoxetine (PROZAC) 20 MG capsule Take 20 mg by mouth daily. 09/26/15   [provider]  Fluticasone-Salmeterol (ADVAIR DISKUS) 250-50 MCG/DOSE AEPB Inhale 1 puff into the lungs 2 (two) times daily. 12/30/15   Tysinger, Kermit Balo, PA-C  fluvoxaMINE (LUVOX) 25 MG tablet Take 25 mg by mouth 2 (two) times daily.    [provider]  furosemide (LASIX) 20 MG tablet Take 1 tablet (20 mg total) by mouth daily. 07/04/16   Tysinger, Kermit Balo, PA-C  glucose blood test strip 1 each by Other route 3 (three) times daily. Use as instructed 10/05/16   Tysinger, Kermit Balo, PA-C  insulin degludec (TRESIBA FLEXTOUCH) 100 UNIT/ML SOPN FlexTouch Pen Inject 0.45 mLs (45 Units total) into the skin daily. 10/05/16   Tysinger, Kermit Balo, PA-C  insulin lispro (HUMALOG KWIKPEN) 100 UNIT/ML KiwkPen Inject 0.25 mLs (25 Units total) into the skin 3 (three) times daily. 10/05/16   Tysinger, Kermit Balo, PA-C  Insulin Pen Needle (NOVOFINE PLUS) 32G X 4 MM MISC USE TWICE DAILY 10/05/16    Genia Del  [facility] TRINZA 819 MG/2.625ML SUSP  08/07/15   [provider]  metFORMIN (GLUCOPHAGE XR) 500 MG 24 hr tablet 2 tablets po daily 10/05/16   Tysinger, Kermit Balo, PA-C  nitroGLYCERIN (NITROSTAT) 0.4 MG SL tablet Place 0.4 mg under the tongue every 5 (five) minutes as needed for chest pain. Reported on 07/15/2015    [provider]  olmesartan (BENICAR) 40 MG tablet Take 1 tablet (40 mg total) by mouth daily. 07/04/16   Tysinger, Kermit Balo, PA-C  pantoprazole (PROTONIX) 40 MG tablet take 1 tablet by mouth twice a day 01/25/16   Tysinger, Kermit Balo, PA-C  rosuvastatin (CRESTOR) 20 MG tablet Take 1 tablet (20 mg total) by mouth daily. 07/04/16   Tysinger, Kermit Balo, PA-C  zolpidem (AMBIEN) 10 MG tablet Take 1 tablet by mouth daily. 08/29/15   [provider]  Family History Family History  Problem Relation Age of Onset  . Diabetes Mother   . Kidney failure Mother   . Diabetes Other   . Cancer Other   . Hypertension Other     Social History Social History  Substance Use Topics  . Smoking status: Light Tobacco Smoker    Last attempt to quit: 12/18/2013  . Smokeless tobacco: Never Used  . Alcohol use No     Allergies   Dexilant [dexlansoprazole]; Famotidine; Meperidine hcl; Dilaudid [hydromorphone hcl]; Gabapentin; Hydrocodone; Ramipril; Ziprasidone hcl; Invokana [canagliflozin]; Iohexol; and Tape   Review of Systems Review of Systems  Constitutional: Negative for fever.  HENT: Negative for ear pain and sore throat.   Eyes: Negative for visual disturbance.  Respiratory: Negative for cough and shortness of breath.   Cardiovascular: Negative for chest pain and palpitations.  Gastrointestinal: Negative for abdominal pain and vomiting.  Genitourinary: Negative for decreased urine volume, dysuria and hematuria.       Noctural incontinence  Musculoskeletal: Negative for arthralgias and back pain.  Skin: Positive for color change (erythema and  tenderness to right breast). Negative for rash.  Allergic/Immunologic: Negative for immunocompromised state.  Neurological: Positive for tremors. Negative for seizures and syncope.  Psychiatric/Behavioral: Positive for confusion and sleep disturbance (Excessive sleepiness).  All other systems reviewed and are negative.  Physical Exam Updated Vital Signs BP (!) 168/90 (BP Location: Left Arm)   Pulse 80   Temp 98.7 F (37.1 C) (Oral)   Resp 14   Ht  (1.626 m)   Wt 98 kg (216 lb)   SpO2 100%   BMI 37.08 kg/m   Physical Exam  Constitutional: She is oriented to person, place, and time. She appears well-developed and well-nourished. No distress.  Obese woman in no acute distress  HENT:  Head: Normocephalic and atraumatic.  Eyes: Pupils are equal, round, and reactive to light. Conjunctivae and EOM are normal.  Neck: Neck supple.  Cardiovascular: Normal rate, regular rhythm and intact distal pulses.   Pulmonary/Chest: Effort normal and breath sounds normal. No stridor. No respiratory distress.  Abdominal: Soft. There is no tenderness.  Musculoskeletal: She exhibits no edema or tenderness.  Neurological: She is alert and oriented to person, place, and time. No cranial nerve deficit or sensory deficit. She exhibits normal muscle tone. Coordination normal.  Slow speech without significant slurring noted; no facial droop  Skin: Skin is warm and dry. Capillary refill takes less than 2 seconds. There is erythema (Erythema and tenderness to right breast and upper outer quadrant without fluctuance or discharge present).  Psychiatric: She has a normal mood and affect. Her behavior is normal. Judgment and thought content normal.  Nursing note and vitals reviewed.  ED Treatments / Results  Labs (all labs ordered are listed, but only abnormal results are displayed) Labs Reviewed  BASIC METABOLIC PANEL - Abnormal; Notable for the following:       Result Value   Sodium 132 (*)    Chloride  99 (*)    CO2 21 (*)    Glucose, Bld 285 (*)    All other components within normal limits  URINALYSIS, ROUTINE W REFLEX MICROSCOPIC - Abnormal; Notable for the following:    Glucose, UA >=500 (*)    Hgb urine dipstick SMALL (*)    Bacteria, UA RARE (*)    Squamous Epithelial / LPF 0-5 (*)    All other components within normal limits  RAPID URINE DRUG SCREEN, HOSP PERFORMED - Abnormal; Notable  for the following:    Benzodiazepines POSITIVE (*)    All other components within normal limits  BETA-HYDROXYBUTYRIC ACID - Abnormal; Notable for the following:    Beta-Hydroxybutyric Acid <0.05 (*)    All other components within normal limits  CBG MONITORING, ED - Abnormal; Notable for the following:    Glucose-Capillary 281 (*)    All other components within normal limits  CBG MONITORING, ED - Abnormal; Notable for the following:    Glucose-Capillary 173 (*)    All other components within normal limits  I-STAT VENOUS BLOOD GAS, ED - Abnormal; Notable for the following:    pO2, Ven 26.0 (*)    All other components within normal limits  CBC  BLOOD GAS, VENOUS   EKG  EKG Interpretation None      Radiology No results found.  Procedures Procedures (including critical care time)  Medications Ordered in ED Medications - No data to display   Initial Impression / Assessment and Plan / ED Course  I have reviewed the triage vital signs and the nursing notes.  Pertinent labs & imaging results that were available during my care of the patient were reviewed by me and considered in my medical decision making (see chart for details).    Initial differential diagnosis included hyperglycemia, DKA, HHS, pneumonia, urinary tract infection, soft tissue infection, CVA, and polypharmacy.  Pertinent labs included point-of-care glucose with hyperglycemia.  CBC with no leukocytosis, anemia, or abnormal platelet count. BM P with mild hyponatremia and hyperglycemia; no other significant electrolyte  abnormalities, AKI, or anion gap.  UA with glucosuria, however no evidence of infection. UDS positive for benzodiazepines. ABG with a normal pH. Beta hydroxybutyrate negative, decreasing my suspicion for DKA. EKG with NSR; normal PR, narrow QRS complex, and normal QTc.  No evidence of arrhythmia, ischemia, or infarct.  Imaging studies included a chest x-ray with cardiomegaly, however no other acute cardiopulmonary abnormalities.  Head CT negative with no evidence of acute hemorrhage, edema, or mass.  Records review revealed a recent increase in frequency and the patient's antipsychotic medication.  Further review revealed the patient is currently taking multiple antipsychotics, SSRIs, Benadryl, and Ambien. The common other irritation of these medications places her at high risk for polypharmacy, which I believe is the most likely diagnosis at this time. The patient was instructed to reduce her doses of Ambien, Benadryl, and Xanax.  The attending physician and I discussed the patient's medications at length with the patient and her daughter.  Upon reassessment, she was able to ambulate without assistance and did not feel dizzy or lightheaded.  The patient was given a prescription for clindamycin for cellulitis of the right breast.  She was instructed to follow-up with her primary care provider as well as the [facility] for further breast imaging if her symptoms do not improve with antibiotics.  Return precautions were discussed and the patient was discharged in stable condition.  Final Clinical Impressions(s) / ED Diagnoses   Final diagnoses:  Hyperglycemia  Polypharmacy  Cellulitis of chest wall   New Prescriptions New Prescriptions   No medications on file     Levester Fresh, MD 12/07/16 1610    Marily Memos, MD 12/12/16 [phone]

## 2016-12-06 NOTE — ED Notes (Signed)
Pt able to ambulate with steady gait around the room with provider and RN. Denied dizziness

## 2016-12-07 MED ORDER — CLINDAMYCIN HCL 300 MG PO CAPS: 300.0000 mg | ORAL_CAPSULE | Freq: Four times a day (QID) | ORAL | 0 refills | Status: DC

## 2016-12-07 NOTE — Discharge Instructions (Signed)
Please talk to doctors and reduce the my medications are on especially sedating once. He can start by trying to take half of the dose of Ambien, Benadryl and Xanax and see if that improves your tiredness and slowed this.  Seen at this thing under breast is possibly an early infection. We'll start antibiotics however if it doesn't improve within 4- 5 days you need to be seen by the breast center above for further evaluation.

## 2017-01-09 DIAGNOSIS — F250 Schizoaffective disorder, bipolar type: Secondary | ICD-10-CM | POA: Diagnosis not present

## 2017-01-09 DIAGNOSIS — Z79899 Other long term (current) drug therapy: Secondary | ICD-10-CM | POA: Diagnosis not present

## 2017-01-12 ENCOUNTER — Other Ambulatory Visit: Payer: Self-pay | Admitting: Medical

## 2017-01-18 ENCOUNTER — Ambulatory Visit
Admission: RE | Admit: 2017-01-18 | Discharge: 2017-01-18 | Disposition: A | Discharge status: Home / Self Care | Payer: Medicare Other | Source: Ambulatory Visit | Attending: Medical | Admitting: Medical

## 2017-01-18 DIAGNOSIS — Z1231 Encounter for screening mammogram for malignant neoplasm of breast: Secondary | ICD-10-CM | POA: Diagnosis not present

## 2017-02-08 ENCOUNTER — Ambulatory Visit: Payer: Self-pay | Admitting: Obstetrics and Gynecology

## 2017-02-09 ENCOUNTER — Ambulatory Visit: Payer: Self-pay | Admitting: Obstetrics and Gynecology

## 2017-02-10 DIAGNOSIS — Z79899 Other long term (current) drug therapy: Secondary | ICD-10-CM | POA: Diagnosis not present

## 2017-02-10 DIAGNOSIS — F200 Paranoid schizophrenia: Secondary | ICD-10-CM | POA: Diagnosis not present

## 2017-02-17 ENCOUNTER — Ambulatory Visit (INDEPENDENT_AMBULATORY_CARE_PROVIDER_SITE_OTHER): Payer: Medicare Other | Admitting: Obstetrics

## 2017-02-17 ENCOUNTER — Encounter: Payer: Self-pay | Admitting: Obstetrics

## 2017-02-17 ENCOUNTER — Encounter: Payer: Self-pay | Admitting: *Deleted

## 2017-02-17 ENCOUNTER — Other Ambulatory Visit (HOSPITAL_COMMUNITY)
Admission: RE | Admit: 2017-02-17 | Discharge: 2017-02-17 | Disposition: A | Discharge status: Home / Self Care | Payer: Medicare Other | Source: Ambulatory Visit | Attending: Obstetrics | Admitting: Obstetrics

## 2017-02-17 VITALS — BP 167/97 | HR 69 | Ht 64.0 in | Wt 227.0 lb

## 2017-02-17 DIAGNOSIS — Z79899 Other long term (current) drug therapy: Secondary | ICD-10-CM | POA: Insufficient documentation

## 2017-02-17 DIAGNOSIS — F319 Bipolar disorder, unspecified: Secondary | ICD-10-CM | POA: Diagnosis not present

## 2017-02-17 DIAGNOSIS — Z113 Encounter for screening for infections with a predominantly sexual mode of transmission: Secondary | ICD-10-CM | POA: Diagnosis not present

## 2017-02-17 DIAGNOSIS — Z6838 Body mass index (BMI) 38.0-38.9, adult: Secondary | ICD-10-CM | POA: Insufficient documentation

## 2017-02-17 DIAGNOSIS — I10 Essential (primary) hypertension: Secondary | ICD-10-CM | POA: Insufficient documentation

## 2017-02-17 DIAGNOSIS — N393 Stress incontinence (female) (male): Secondary | ICD-10-CM | POA: Diagnosis not present

## 2017-02-17 DIAGNOSIS — E119 Type 2 diabetes mellitus without complications: Secondary | ICD-10-CM | POA: Diagnosis not present

## 2017-02-17 DIAGNOSIS — Z87891 Personal history of nicotine dependence: Secondary | ICD-10-CM | POA: Insufficient documentation

## 2017-02-17 DIAGNOSIS — Z90710 Acquired absence of both cervix and uterus: Secondary | ICD-10-CM | POA: Insufficient documentation

## 2017-02-17 DIAGNOSIS — Z01411 Encounter for gynecological examination (general) (routine) with abnormal findings: Secondary | ICD-10-CM

## 2017-02-17 DIAGNOSIS — Z794 Long term (current) use of insulin: Secondary | ICD-10-CM | POA: Diagnosis not present

## 2017-02-17 DIAGNOSIS — E669 Obesity, unspecified: Secondary | ICD-10-CM

## 2017-02-17 DIAGNOSIS — Z888 Allergy status to other drugs, medicaments and biological substances status: Secondary | ICD-10-CM | POA: Diagnosis not present

## 2017-02-17 DIAGNOSIS — Z01419 Encounter for gynecological examination (general) (routine) without abnormal findings: Secondary | ICD-10-CM | POA: Diagnosis not present

## 2017-02-17 DIAGNOSIS — D649 Anemia, unspecified: Secondary | ICD-10-CM | POA: Diagnosis not present

## 2017-02-17 DIAGNOSIS — Z91041 Radiographic dye allergy status: Secondary | ICD-10-CM | POA: Insufficient documentation

## 2017-02-17 DIAGNOSIS — Z841 Family history of disorders of kidney and ureter: Secondary | ICD-10-CM | POA: Insufficient documentation

## 2017-02-17 DIAGNOSIS — I2510 Atherosclerotic heart disease of native coronary artery without angina pectoris: Secondary | ICD-10-CM | POA: Diagnosis not present

## 2017-02-17 DIAGNOSIS — Z885 Allergy status to narcotic agent status: Secondary | ICD-10-CM | POA: Diagnosis not present

## 2017-02-17 DIAGNOSIS — Z7951 Long term (current) use of inhaled steroids: Secondary | ICD-10-CM | POA: Diagnosis not present

## 2017-02-17 DIAGNOSIS — Z833 Family history of diabetes mellitus: Secondary | ICD-10-CM | POA: Diagnosis not present

## 2017-02-17 DIAGNOSIS — J449 Chronic obstructive pulmonary disease, unspecified: Secondary | ICD-10-CM

## 2017-02-17 DIAGNOSIS — K219 Gastro-esophageal reflux disease without esophagitis: Secondary | ICD-10-CM | POA: Diagnosis not present

## 2017-02-17 NOTE — Progress Notes (Signed)
Patient presents for her Annual Exam today.Pt states she has no concerns today.  Pt desires STD testing  Last Mammogram: 01/20/17

## 2017-02-17 NOTE — Progress Notes (Signed)
Subjective:        Cheyenne Alvarez is a 51 y.o. female here for a routine exam.  Current complaints: Leaking of urine with cough, sneeze, laughing,etc.  S/P Hysterectomy in 1996, and leaking of urine has gotten progressively worse since then.  She has to wear a pad.   Personal health questionnaire:  Is patient Ashkenazi Jewish, have a family history of breast and/or ovarian cancer: yes Is there a family history of uterine cancer diagnosed at age < 1, gastrointestinal cancer, urinary tract cancer, family member who is a Personnel officer syndrome-associated carrier: no Is the patient overweight and hypertensive, family history of diabetes, personal history of gestational diabetes, preeclampsia or PCOS: yes Is patient over 38, have PCOS,  family history of premature CHD under age 67, diabetes, smoke, have hypertension or peripheral artery disease:  no At any time, has a partner hit, kicked or otherwise hurt or frightened you?: no Over the past 2 weeks, have you felt down, depressed or hopeless?: no Over the past 2 weeks, have you felt little interest or pleasure in doing things?:no   Gynecologic History No LMP recorded. Patient has had a hysterectomy. Contraception: status post hysterectomy Last Pap: 2011. Results were: normal Last mammogram: 2018. Results were: normal  Obstetric History OB History  Gravida Para Term Preterm AB Living  5       1 4   SAB TAB Ectopic Multiple Live Births  1            # Outcome Date GA Lbr Len/2nd Weight Sex Delivery Anes PTL Lv  5 Gravida           4 Gravida           3 Gravida           2 Gravida           1 SAB               Past Medical History:  Diagnosis Date  . Alopecia   . Anemia   . Anxiety   . Bipolar 1 disorder (HCC)   . COPD (chronic obstructive pulmonary disease) (HCC)   . Coronary artery disease   . Depression   . Diabetes mellitus 2010  . Gastritis   . GERD (gastroesophageal reflux disease)   . Headache    migraines  .  Hypertension 2010  . Obesity   . Pneumonia   . PONV (postoperative nausea and vomiting)   . PTSD (post-traumatic stress disorder)   . Schizophrenia (HCC)   . Tobacco use     Past Surgical History:  Procedure Laterality Date  . ABDOMINAL HYSTERECTOMY    . CARDIAC CATHETERIZATION    . CHOLECYSTECTOMY    . COLONOSCOPY  05/14/14   hemorrhoids, otherwise normal; Dr. Charlott Rakes  . COLONOSCOPY WITH PROPOFOL N/A 05/14/2014   Procedure: COLONOSCOPY WITH PROPOFOL;  Surgeon: Charlott Rakes, MD;  Location[facility] ENDOSCOPY;  Service: Endoscopy;  Laterality: N/A;  . ESOPHAGOGASTRODUODENOSCOPY (EGD) WITH PROPOFOL N/A 05/14/2014   Procedure: ESOPHAGOGASTRODUODENOSCOPY (EGD) WITH PROPOFOL;  Surgeon: Charlott Rakes, MD;  Location: [facility] ENDOSCOPY;  Service: Endoscopy;  Laterality: N/A;  . FRACTURE SURGERY     left leg  . LEG SURGERY    . TUBAL LIGATION       Current Outpatient Medications:  .  albuterol (PROVENTIL HFA;VENTOLIN HFA) 108 (90 Base) MCG/ACT inhaler, Inhale 2 puffs into the lungs every 6 (six) hours as needed for wheezing. For wheezing (Patient taking differently: Inhale 2  puffs into the lungs every 6 (six) hours as needed for wheezing. ), Disp: 18 g, Rfl: 1 .  ALPRAZolam (XANAX) 1 MG tablet, Take 1 mg by mouth 3 (three) times daily. FOR ANXIETY, Disp: , Rfl: 0 .  benztropine (COGENTIN) 2 MG tablet, Take 2 mg by mouth 2 (two) times daily. FOR TREMORS AND MOUTH MOVEMENTS, Disp: , Rfl: 0 .  busPIRone (BUSPAR) 15 MG tablet, Take 15 mg by mouth 2 (two) times daily. Reported on 04/20/2015, Disp: , Rfl:  .  diphenhydrAMINE (BENADRYL) 25 mg capsule, Take 50 mg by mouth at bedtime. Reported on 04/07/2015, Disp: , Rfl:  .  EPINEPHrine (EPIPEN 2-PAK) 0.3 mg/0.3 mL IJ SOAJ injection, Inject 0.3 mLs (0.3 mg total) into the muscle once., Disp: 1 Device, Rfl: 2 .  FANAPT 6 MG TABS, , Disp: , Rfl: 0 .  ferrous sulfate 325 (65 FE) MG tablet, Take 1 tablet (325 mg total) by mouth daily with breakfast., Disp:  90 tablet, Rfl: 3 .  FLUoxetine (PROZAC) 40 MG capsule, Take 40 mg by mouth daily., Disp: , Rfl: 0 .  Fluticasone-Salmeterol (ADVAIR DISKUS) 250-50 MCG/DOSE AEPB, Inhale 1 puff into the lungs 2 (two) times daily., Disp: 60 each, Rfl: 5 .  fluvoxaMINE (LUVOX) 25 MG tablet, Take 25 mg by mouth 2 (two) times daily., Disp: , Rfl:  .  furosemide (LASIX) 20 MG tablet, Take 1 tablet (20 mg total) by mouth daily., Disp: 90 tablet, Rfl: 3 .  glucose blood test strip, 1 each by Other route 3 (three) times daily. Use as instructed, Disp: 100 each, Rfl: 5 .  insulin degludec (TRESIBA FLEXTOUCH) 100 UNIT/ML SOPN FlexTouch Pen, Inject 0.45 mLs (45 Units total) into the skin daily., Disp: 3 mL, Rfl: 5 .  insulin lispro (HUMALOG KWIKPEN) 100 UNIT/ML KiwkPen, Inject 0.25 mLs (25 Units total) into the skin 3 (three) times daily., Disp: 30 mL, Rfl: 5 .  Insulin Pen Needle (NOVOFINE PLUS) 32G X 4 MM MISC, USE TWICE DAILY, Disp: 100 each, Rfl: 5 .  INVEGA TRINZA 819 MG/2.625ML SUSP, , Disp: , Rfl: 0 .  loxapine (LOXITANE) 50 MG capsule, Take 50 mg by mouth at bedtime., Disp: , Rfl: 0 .  metFORMIN (GLUCOPHAGE XR) 500 MG 24 hr tablet, 2 tablets po daily (Patient taking differently: Take 1,000 mg by mouth daily. ), Disp: 180 tablet, Rfl: 3 .  nitroGLYCERIN (NITROSTAT) 0.4 MG SL tablet, Place 0.4 mg under the tongue every 5 (five) minutes as needed for chest pain. Reported on 07/15/2015, Disp: , Rfl:  .  olmesartan (BENICAR) 40 MG tablet, Take 1 tablet (40 mg total) by mouth daily., Disp: 90 tablet, Rfl: 3 .  pantoprazole (PROTONIX) 40 MG tablet, take 1 tablet by mouth twice a day, Disp: 40 tablet, Rfl: 1 .  rosuvastatin (CRESTOR) 20 MG tablet, Take 1 tablet (20 mg total) by mouth daily., Disp: 90 tablet, Rfl: 3 .  zolpidem (AMBIEN) 10 MG tablet, Take 1 tablet by mouth at bedtime. , Disp: , Rfl: 0 .  amantadine (SYMMETREL) 100 MG capsule, Take 100 mg by mouth 2 (two) times daily. FOR TREMORS, Disp: , Rfl: 0 .  amLODipine  (NORVASC) 10 MG tablet, Take 1 tablet (10 mg total) by mouth daily. (Patient not taking: Reported on 02/17/2017), Disp: 90 tablet, Rfl: 3 .  atenolol (TENORMIN) 50 MG tablet, Take 1 tablet (50 mg total) by mouth 2 (two) times daily. (Patient not taking: Reported on 02/17/2017), Disp: 180 tablet, Rfl: 3 .  AUSTEDO 6 MG TABS, Take 6 mg by mouth 2 (two) times daily., Disp: , Rfl: 0 .  clindamycin (CLEOCIN) 300 MG capsule, Take 1 capsule (300 mg total) by mouth 4 (four) times daily. X 7 days (Patient not taking: Reported on 02/17/2017), Disp: 28 capsule, Rfl: 0 Allergies  Allergen Reactions  . Dexilant [Dexlansoprazole] Shortness Of Breath, Diarrhea, Nausea And Vomiting and Other (See Comments)    Chest pain and abdominal pain (also)  . Famotidine Anaphylaxis  . Meperidine Hcl Anaphylaxis  . Dilaudid [Hydromorphone Hcl] Other (See Comments)    Change in mental state  . Gabapentin Other (See Comments)    Memory loss  . Hydrocodone Nausea And Vomiting  . Ramipril Swelling and Other (See Comments)    Angioedema   . Ziprasidone Hcl Other (See Comments)    Caused convulsions  . Invokana [Canagliflozin] Rash  . Iohexol Itching and Rash  . Tape Rash    Use paper tape only     Social History   Tobacco Use  . Smoking status: Former Smoker    Last attempt to quit: 12/18/2013    Years since quitting: 3.1  . Smokeless tobacco: Never Used  Substance Use Topics  . Alcohol use: No    Alcohol/week: 0.0 oz    Family History  Problem Relation Age of Onset  . Diabetes Mother   . Kidney failure Mother   . Diabetes Other   . Cancer Other   . Hypertension Other   . Breast cancer Sister [id number]  . Breast cancer Maternal Grandmother       Review of Systems  Constitutional: negative for fatigue and weight loss Respiratory: negative for cough and wheezing Cardiovascular: negative for chest pain, fatigue and palpitations Gastrointestinal: negative for abdominal pain and change in bowel  habits Musculoskeletal:negative for myalgias Neurological: negative for gait problems and tremors Behavioral/Psych: negative for abusive relationship, depression Endocrine: negative for temperature intolerance    Genitourinary:negative for abnormal menstrual periods, genital lesions, hot flashes, sexual problems and vaginal discharge Integument/breast: negative for breast lump, breast tenderness, nipple discharge and skin lesion(s)    Objective:       BP (!) 167/97   Pulse 69   Ht 5\' 4"  (1.626 m)   Wt 227 lb (103 kg)   BMI 38.96 kg/m  General:   alert  Skin:   no rash or abnormalities  Lungs:   clear to auscultation bilaterally  Heart:   regular rate and rhythm, S1, S2 normal, no murmur, click, rub or gallop  Breasts:   normal without suspicious masses, skin or nipple changes or axillary nodes  Abdomen:  normal findings: no organomegaly, soft, non-tender and no hernia  Pelvis:  External genitalia: normal general appearance Urinary system: urethral meatus normal and bladder without fullness, nontender Vaginal: normal without tenderness, induration or masses Cervix: absent Adnexa: normal bimanual exam Uterus: absent   Lab Review Urine pregnancy test: n/a Labs reviewed:  yes Radiologic studies reviewed:  yes  50% of 20 min visit spent on counseling and coordination of care.   Assessment:     1. Encounter for gynecological examination Rx: - Cervicovaginal ancillary only  2. S/P hysterectomy  3. SUI (stress urinary incontinence, female) Rx: - Ambulatory referral to Urogynecology  4. Screening for STD (sexually transmitted disease) Rx: - HIV antibody - Hepatitis B surface antigen - RPR - Hepatitis C antibody  5. Obesity with body mass index of 30.0-39.9 - program of caloric reduction, exercise and behavioral modification recommended  6. HTN (hypertension), benign - stable, managed by PCP  7. Coronary artery disease involving native heart, angina presence  unspecified, unspecified vessel or lesion type - stable, managed by PCP  8. Chronic obstructive pulmonary disease, unspecified COPD type (HCC) - stable, managed by PCP  9. Bipolar affective disorder, remission status unspecified (HCC) - stable, managed by PCP   Plan:    Education reviewed: calcium supplements, depression evaluation, low fat, low cholesterol diet, self breast exams and weight bearing exercise. Follow up in: 1 year.   No orders of the defined types were placed in this encounter.  No orders of the defined types were placed in this encounter.

## 2017-02-18 LAB — HEPATITIS C ANTIBODY: Hep C Virus Ab: <0.1 s/co ratio (ref 0.0–0.9)

## 2017-02-18 LAB — HIV ANTIBODY (ROUTINE TESTING W REFLEX): HIV Screen 4th Generation wRfx: NONREACTIVE

## 2017-02-18 LAB — RPR: RPR: NONREACTIVE

## 2017-02-18 LAB — HEPATITIS B SURFACE ANTIGEN: Hepatitis B Surface Ag: NEGATIVE

## 2017-02-21 LAB — CERVICOVAGINAL ANCILLARY ONLY
BACTERIAL VAGINITIS: NEGATIVE
Candida vaginitis: POSITIVE — AB
Chlamydia: NEGATIVE
NEISSERIA GONORRHEA: NEGATIVE
TRICH (WINDOWPATH): NEGATIVE

## 2017-02-22 ENCOUNTER — Other Ambulatory Visit: Payer: Self-pay | Admitting: Obstetrics

## 2017-02-23 ENCOUNTER — Other Ambulatory Visit: Payer: Self-pay | Admitting: Obstetrics

## 2017-02-23 DIAGNOSIS — B373 Candidiasis of vulva and vagina: Secondary | ICD-10-CM

## 2017-02-23 DIAGNOSIS — B3731 Acute candidiasis of vulva and vagina: Secondary | ICD-10-CM

## 2017-02-23 MED ORDER — FLUCONAZOLE 150 MG PO TABS: 150.0000 mg | ORAL_TABLET | Freq: Once | ORAL | 0 refills | Status: AC

## 2017-03-17 DIAGNOSIS — F200 Paranoid schizophrenia: Secondary | ICD-10-CM | POA: Diagnosis not present

## 2017-03-17 DIAGNOSIS — Z79899 Other long term (current) drug therapy: Secondary | ICD-10-CM | POA: Diagnosis not present

## 2017-03-25 DIAGNOSIS — N393 Stress incontinence (female) (male): Secondary | ICD-10-CM | POA: Insufficient documentation

## 2017-04-14 DIAGNOSIS — F250 Schizoaffective disorder, bipolar type: Secondary | ICD-10-CM | POA: Diagnosis not present

## 2017-04-14 DIAGNOSIS — Z79899 Other long term (current) drug therapy: Secondary | ICD-10-CM | POA: Diagnosis not present

## 2017-05-08 DIAGNOSIS — F250 Schizoaffective disorder, bipolar type: Secondary | ICD-10-CM | POA: Diagnosis not present

## 2017-05-08 DIAGNOSIS — Z79899 Other long term (current) drug therapy: Secondary | ICD-10-CM | POA: Diagnosis not present

## 2017-05-17 ENCOUNTER — Other Ambulatory Visit: Payer: Self-pay | Admitting: Medical

## 2017-06-04 ENCOUNTER — Other Ambulatory Visit: Payer: Self-pay | Admitting: Medical

## 2017-06-05 NOTE — Telephone Encounter (Signed)
LOV- 10/04/16, okay to refill?  Thanks!

## 2017-06-22 ENCOUNTER — Other Ambulatory Visit: Payer: Self-pay | Admitting: Medical

## 2017-06-26 ENCOUNTER — Telehealth: Payer: Self-pay | Admitting: Medical

## 2017-06-26 NOTE — Telephone Encounter (Signed)
Pt is coming in wednesday May 1st

## 2017-06-26 NOTE — Telephone Encounter (Signed)
Get her in ASAP as I don't think she is seeing endocrinology like she should, and I received 2 forms to complete on her (Diabetes and adult diaper supplies)

## 2017-06-28 ENCOUNTER — Telehealth: Payer: Self-pay | Admitting: Medical

## 2017-06-28 ENCOUNTER — Encounter: Payer: Self-pay | Admitting: Medical

## 2017-06-28 ENCOUNTER — Ambulatory Visit (INDEPENDENT_AMBULATORY_CARE_PROVIDER_SITE_OTHER): Payer: Medicare Other | Admitting: Medical

## 2017-06-28 VITALS — BP 150/86 | HR 70 | Temp 98.6°F | Ht 64.0 in | Wt 198.0 lb

## 2017-06-28 DIAGNOSIS — E119 Type 2 diabetes mellitus without complications: Secondary | ICD-10-CM

## 2017-06-28 DIAGNOSIS — E1165 Type 2 diabetes mellitus with hyperglycemia: Secondary | ICD-10-CM | POA: Diagnosis not present

## 2017-06-28 DIAGNOSIS — R251 Tremor, unspecified: Secondary | ICD-10-CM | POA: Diagnosis not present

## 2017-06-28 DIAGNOSIS — Z79899 Other long term (current) drug therapy: Secondary | ICD-10-CM | POA: Diagnosis not present

## 2017-06-28 DIAGNOSIS — F250 Schizoaffective disorder, bipolar type: Secondary | ICD-10-CM | POA: Diagnosis not present

## 2017-06-28 DIAGNOSIS — I10 Essential (primary) hypertension: Secondary | ICD-10-CM

## 2017-06-28 DIAGNOSIS — R6889 Other general symptoms and signs: Secondary | ICD-10-CM

## 2017-06-28 DIAGNOSIS — Z87891 Personal history of nicotine dependence: Secondary | ICD-10-CM | POA: Insufficient documentation

## 2017-06-28 DIAGNOSIS — R32 Unspecified urinary incontinence: Secondary | ICD-10-CM

## 2017-06-28 DIAGNOSIS — E785 Hyperlipidemia, unspecified: Secondary | ICD-10-CM

## 2017-06-28 DIAGNOSIS — Z789 Other specified health status: Secondary | ICD-10-CM

## 2017-06-28 DIAGNOSIS — F411 Generalized anxiety disorder: Secondary | ICD-10-CM | POA: Diagnosis not present

## 2017-06-28 DIAGNOSIS — IMO0001 Reserved for inherently not codable concepts without codable children: Secondary | ICD-10-CM

## 2017-06-28 DIAGNOSIS — Z794 Long term (current) use of insulin: Secondary | ICD-10-CM

## 2017-06-28 DIAGNOSIS — J449 Chronic obstructive pulmonary disease, unspecified: Secondary | ICD-10-CM | POA: Diagnosis not present

## 2017-06-28 NOTE — Patient Instructions (Signed)
Recommendations  Glad to hear you quit smoking!!!  Consider using [facility] or [facility].  These local pharmacies can bubble wrap your medications for a month at a time which can cut down on errors or confusion, and can make medication dispensing easier for you  Continue walking for exercise  We will refer you back to Dr. Sharyn Lull for yearly follow up  We will call with lab results and recommendations

## 2017-06-28 NOTE — Progress Notes (Signed)
Subjective: Chief Complaint  Patient presents with  . Forms   Here at my request for f/u.   She reports that she is not seeing any other doctor although we had her seeing endocrinology this past year.   She hasn't been compliant with f/u for diabetes.  Of note, she did see Dr. Gaynell Face this past year for gynecological care.   Diabetes - checks TID reportedly.   Is getting in the 160s in the morning.  Compliant with Tresiba 45 u QHS, Humalog 35u in the mornign only, metformin xr  , 2 tablets daily.   Checking feet.   Walking for exercise daily, has recently intentionally lost weight in recent months.  HTN - compliant with Amlodipine  daily, Atenolol  BID, benicar  daily, lasix  daily.  CheckingBPs and they are staying elevated.   Sees Dr. Sharyn Lull, last visit about a year ago.  Urinates quite a bit with the lasix.  Hyperlipidemia - compliant with  daily  COPD - quit smoking about a year ago.   Taking Advair 1 puff BID.  Not having to use the albuterol emergency inhaler.   Urinary incontinence - had this problem for over a year, using adult diapers.  She reports today no prior urology consult.   She notes having problems after having hysterectomy.    She still has a nurse aid that comes out and helps with her ADLs and medications.  Her daughter [name] helps her with her financial affairs and medications.    Sees psychiatrist again this week.  Has a new tremor since starting new injectable medication for schizophrenia.   Sees Dr. Penelope Galas.  Sees him every month.   Mood has been rough lately.     Past Medical History:  Diagnosis Date  . Alopecia   . Anemia   . Anxiety   . Bipolar 1 disorder (HCC)   . COPD (chronic obstructive pulmonary disease) (HCC)   . Coronary artery disease   . Depression   . Diabetes mellitus 2010  . Gastritis   . GERD (gastroesophageal reflux disease)   . Headache    migraines  . Hypertension 2010  . Obesity   . Pneumonia   . PONV  (postoperative nausea and vomiting)   . PTSD (post-traumatic stress disorder)   . Schizophrenia (HCC)   . Tobacco use    Current Outpatient Medications on File Prior to Visit  Medication Sig Dispense Refill  . albuterol (PROVENTIL HFA;VENTOLIN HFA) 108 (90 Base) MCG/ACT inhaler Inhale 2 puffs into the lungs every 6 (six) hours as needed for wheezing. For wheezing (Patient taking differently: Inhale 2 puffs into the lungs every 6 (six) hours as needed for wheezing. ) 18 g 1  . ALPRAZolam (XANAX) 1 MG tablet Take 1 mg by mouth 3 (three) times daily. FOR ANXIETY  0  . amantadine (SYMMETREL) 100 MG capsule Take 100 mg by mouth 2 (two) times daily. FOR TREMORS  0  . amLODipine (NORVASC) 10 MG tablet Take 1 tablet (10 mg total) by mouth daily. 90 tablet 3  . atenolol (TENORMIN) 50 MG tablet Take 1 tablet (50 mg total) by mouth 2 (two) times daily. 180 tablet 3  . benztropine (COGENTIN) 2 MG tablet Take 2 mg by mouth 2 (two) times daily. FOR TREMORS AND MOUTH MOVEMENTS  0  . busPIRone (BUSPAR) 15 MG tablet Take 15 mg by mouth 2 (two) times daily. Reported on 04/20/2015    . diphenhydrAMINE (BENADRYL) 25 mg capsule Take 50 mg  by mouth at bedtime. Reported on 04/07/2015    . EPINEPHrine (EPIPEN 2-PAK) 0.3 mg/0.3 mL IJ SOAJ injection Inject 0.3 mLs (0.3 mg total) into the muscle once. 1 Device 2  . FLUoxetine (PROZAC) 40 MG capsule Take 40 mg by mouth daily.  0  . Fluticasone-Salmeterol (ADVAIR DISKUS) 250-50 MCG/DOSE AEPB Inhale 1 puff into the lungs 2 (two) times daily. 60 each 5  . furosemide (LASIX) 20 MG tablet Take 1 tablet (20 mg total) by mouth daily. 90 tablet 3  . glucose blood test strip 1 each by Other route 3 (three) times daily. Use as instructed 100 each 5  . insulin degludec (TRESIBA FLEXTOUCH) 100 UNIT/ML SOPN FlexTouch Pen Inject 0.45 mLs (45 Units total) into the skin daily. 3 mL 5  . insulin lispro (HUMALOG KWIKPEN) 100 UNIT/ML KiwkPen Inject 0.25 mLs (25 Units total) into the skin 3  (three) times daily. 30 mL 5  . Insulin Pen Needle (NOVOFINE PLUS) 32G X 4 MM MISC USE TWICE DAILY 100 each 5  . INVEGA TRINZA 819 MG/2.625ML SUSP   0  . loxapine (LOXITANE) 50 MG capsule Take 50 mg by mouth at bedtime.  0  . metFORMIN (GLUCOPHAGE XR) 500 MG 24 hr tablet 2 tablets po daily (Patient taking differently: Take 1,000 mg by mouth daily. ) 180 tablet 3  . olmesartan (BENICAR) 40 MG tablet Take 1 tablet (40 mg total) by mouth daily. 90 tablet 3  . pantoprazole (PROTONIX) 40 MG tablet TAKE 1 TABLET BY MOUTH TWICE A DAY 40 tablet 0  . rosuvastatin (CRESTOR) 20 MG tablet Take 1 tablet (20 mg total) by mouth daily. 90 tablet 3  . zolpidem (AMBIEN) 10 MG tablet Take 1 tablet by mouth at bedtime.   0  . AUSTEDO 6 MG TABS Take 6 mg by mouth 2 (two) times daily.  0  . FANAPT 6 MG TABS   0  . fluvoxaMINE (LUVOX) 25 MG tablet Take 25 mg by mouth 2 (two) times daily.    . nitroGLYCERIN (NITROSTAT) 0.4 MG SL tablet Place 0.4 mg under the tongue every 5 (five) minutes as needed for chest pain. Reported on 07/15/2015     No current facility-administered medications on file prior to visit.    ROS as in subjective   Objective: BP (!) 150/86   Pulse 70   Temp 98.6 F (37 C) (Oral)   Ht  (1.626 m)   Wt 198 lb (89.8 kg)   SpO2 97%   BMI 33.99 kg/m   Wt Readings from Last 3 Encounters:  06/28/17 198 lb (89.8 kg)  02/17/17 227 lb (103 kg)  12/06/16 216 lb (98 kg)   BP Readings from Last 3 Encounters:  06/28/17 (!) 150/86  02/17/17 (!) 167/97  12/07/16 (!) 194/113   General appearance: alert, no distress, WD/WN,  Psych: flat affect today Oral cavity: MMM Neck: supple, no lymphadenopathy, no thyromegaly, no masses Heart: RRR, normal S1, S2, no murmurs Lungs: CTA bilaterally, no wheezes, rhonchi, or rales Abdomen: +bs, soft, non tender, non distended, no masses, no hepatomegaly, no splenomegaly Pulses: 2+ symmetric, upper and lower extremities, normal cap refill Ext: no  edema  Foot exam deferred today    Assessment: Encounter Diagnoses  Name Primary?  . Schizoaffective disorder, bipolar type (HCC) Yes  . Decreased activities of daily living (ADL)   . Generalized anxiety disorder   . Uncontrolled type 2 diabetes mellitus with hyperglycemia (HCC)   . Essential hypertension   .  Hyperlipidemia, unspecified hyperlipidemia type   . High risk medication use   . Incontinence in female   . Chronic obstructive pulmonary disease, unspecified COPD type (HCC)   . Former smoker   . Tremor      Plan: Schizoaffective disorder, anxiety - f/u with psychiatry this week as planned  Tremor - new likely medication induced.  F/u with psychiatry  Diabetes - she should be seeing endocrinology but never went back for f/u.  She wasn't comfortable with endocrinologist, was upset due to discussion about her weight.    Updated labs today.   Completed her forms for diabetes testing supplies.   F/u pending labs  HTN - not at goal.   C/t current regimen, but referred back to Dr. Sharyn Lull  hyperlipidemia - labs today, c/t current therapy  Incontinence - completed form for adult diapers.   She is not agreeable to urology referral although this was advised.   Consider changing from Lasix to HCTZ as lasix aggravates the issue  COPD - glad to hear she stopped tobacco.  C/t Advair  Indy was seen today for forms.  Diagnoses and all orders for this visit:  Schizoaffective disorder, bipolar type (HCC)  Decreased activities of daily living (ADL)  Generalized anxiety disorder  Uncontrolled type 2 diabetes mellitus with hyperglycemia (HCC) -     Comprehensive metabolic panel -     CBC with Differential/Platelet -     TSH -     Hemoglobin A1c -     HM DIABETES EYE EXAM -     HM DIABETES FOOT EXAM -     Microalbumin / creatinine urine ratio -     Ambulatory referral to Cardiology  Essential hypertension -     Comprehensive metabolic panel -     Lipid panel -      TSH -     Ambulatory referral to Cardiology  Hyperlipidemia, unspecified hyperlipidemia type -     Comprehensive metabolic panel -     Lipid panel -     TSH -     Ambulatory referral to Cardiology  High risk medication use  Incontinence in female  Chronic obstructive pulmonary disease, unspecified COPD type (HCC)  Former smoker  Tremor

## 2017-06-28 NOTE — Telephone Encounter (Signed)
Refer back to Dr. Sharyn Lull cardiology for yearly f/u.  Make sure you send copy of my notes today and lab results.

## 2017-06-29 ENCOUNTER — Other Ambulatory Visit: Payer: Self-pay | Admitting: Medical

## 2017-06-29 LAB — CBC WITH DIFFERENTIAL/PLATELET
Basophils Absolute: 0 10*3/uL (ref 0.0–0.2)
Basos: 0 %
EOS (ABSOLUTE): 0.2 10*3/uL (ref 0.0–0.4)
EOS: 2 %
HEMATOCRIT: 36.3 % (ref 34.0–46.6)
HEMOGLOBIN: 11.8 g/dL (ref 11.1–15.9)
IMMATURE GRANS (ABS): 0 10*3/uL (ref 0.0–0.1)
Immature Granulocytes: 0 %
LYMPHS ABS: 2.8 10*3/uL (ref 0.7–3.1)
LYMPHS: 30 %
MCH: 26.6 pg (ref 26.6–33.0)
MCHC: 32.5 g/dL (ref 31.5–35.7)
MCV: 82 fL (ref 79–97)
MONOCYTES: 5 %
Monocytes Absolute: 0.5 10*3/uL (ref 0.1–0.9)
NEUTROS ABS: 6 10*3/uL (ref 1.4–7.0)
Neutrophils: 63 %
Platelets: 307 10*3/uL (ref 150–379)
RBC: 4.43 x10E6/uL (ref 3.77–5.28)
RDW: 15 % (ref 12.3–15.4)
WBC: 9.5 10*3/uL (ref 3.4–10.8)

## 2017-06-29 LAB — COMPREHENSIVE METABOLIC PANEL
ALBUMIN: 3.7 g/dL (ref 3.5–5.5)
ALT: 12 IU/L (ref 0–32)
AST: 14 IU/L (ref 0–40)
Albumin/Globulin Ratio: 1.1 — ABNORMAL LOW (ref 1.2–2.2)
Alkaline Phosphatase: 112 IU/L (ref 39–117)
BUN / CREAT RATIO: 8 — AB (ref 9–23)
BUN: 8 mg/dL (ref 6–24)
Bilirubin Total: <0.2 mg/dL (ref 0.0–1.2)
CO2: 22 mmol/L (ref 20–29)
Calcium: 9.2 mg/dL (ref 8.7–10.2)
Chloride: 101 mmol/L (ref 96–106)
Creatinine, Ser: 1.05 mg/dL — ABNORMAL HIGH (ref 0.57–1.00)
GFR calc non Af Amer: 61 mL/min/{1.73_m2} (ref >59)
GFR, EST AFRICAN AMERICAN: 71 mL/min/{1.73_m2} (ref >59)
GLUCOSE: 196 mg/dL — AB (ref 65–99)
Globulin, Total: 3.3 g/dL (ref 1.5–4.5)
Potassium: 4 mmol/L (ref 3.5–5.2)
Sodium: 138 mmol/L (ref 134–144)
TOTAL PROTEIN: 7 g/dL (ref 6.0–8.5)

## 2017-06-29 LAB — LIPID PANEL
CHOL/HDL RATIO: 4 ratio (ref 0.0–4.4)
CHOLESTEROL TOTAL: 199 mg/dL (ref 100–199)
HDL: 50 mg/dL (ref >39)
LDL CALC: 121 mg/dL — AB (ref 0–99)
TRIGLYCERIDES: 141 mg/dL (ref 0–149)
VLDL CHOLESTEROL CAL: 28 mg/dL (ref 5–40)

## 2017-06-29 LAB — TSH: TSH: 0.948 u[IU]/mL (ref 0.450–4.500)

## 2017-06-29 LAB — HEMOGLOBIN A1C
Est. average glucose Bld gHb Est-mCnc: 252 mg/dL
Hgb A1c MFr Bld: 10.4 % — ABNORMAL HIGH (ref 4.8–5.6)

## 2017-06-29 LAB — MICROALBUMIN / CREATININE URINE RATIO
Creatinine, Urine: 190.8 mg/dL
MICROALBUM., U, RANDOM: 10.3 ug/mL
Microalb/Creat Ratio: 5.4 mg/g creat (ref 0.0–30.0)

## 2017-06-29 MED ORDER — ROSUVASTATIN CALCIUM 20 MG PO TABS: 20.0000 mg | ORAL_TABLET | Freq: Every day | ORAL | 3 refills | Status: DC

## 2017-06-29 MED ORDER — ALBUTEROL SULFATE HFA 108 (90 BASE) MCG/ACT IN AERS: 2.0000 | INHALATION_SPRAY | Freq: Four times a day (QID) | RESPIRATORY_TRACT | 1 refills | Status: DC | PRN

## 2017-06-29 MED ORDER — INSULIN DEGLUDEC 100 UNIT/ML ~~LOC~~ SOPN: 50.0000 [IU] | PEN_INJECTOR | Freq: Every day | SUBCUTANEOUS | 5 refills | Status: DC

## 2017-06-29 MED ORDER — AMLODIPINE BESYLATE 10 MG PO TABS: 10.0000 mg | ORAL_TABLET | Freq: Every day | ORAL | 0 refills | Status: DC

## 2017-06-29 MED ORDER — EPINEPHRINE 0.3 MG/0.3ML IJ SOAJ: 0.3000 mg | Freq: Once | INTRAMUSCULAR | 1 refills | Status: AC

## 2017-06-29 MED ORDER — INSULIN PEN NEEDLE 32G X 4 MM MISC: 5 refills | Status: DC

## 2017-06-29 MED ORDER — FLUTICASONE-SALMETEROL 250-50 MCG/DOSE IN AEPB: 1.0000 | INHALATION_SPRAY | Freq: Two times a day (BID) | RESPIRATORY_TRACT | 5 refills | Status: DC

## 2017-06-29 MED ORDER — OLMESARTAN MEDOXOMIL-HCTZ 40-25 MG PO TABS: 1.0000 | ORAL_TABLET | Freq: Every day | ORAL | 0 refills | Status: DC

## 2017-06-29 MED ORDER — INSULIN LISPRO 100 UNIT/ML (KWIKPEN): 25.0000 [IU] | PEN_INJECTOR | Freq: Three times a day (TID) | SUBCUTANEOUS | 5 refills | Status: DC

## 2017-06-29 MED ORDER — METFORMIN HCL ER 500 MG PO TB24
1000.0000 mg | ORAL_TABLET | Freq: Every day | ORAL | 1 refills | Status: DC
Start: 2017-06-29 — End: 2018-01-04

## 2017-06-30 ENCOUNTER — Telehealth: Payer: Self-pay | Admitting: Medical

## 2017-06-30 ENCOUNTER — Other Ambulatory Visit: Payer: Self-pay | Admitting: Medical

## 2017-06-30 DIAGNOSIS — F250 Schizoaffective disorder, bipolar type: Secondary | ICD-10-CM | POA: Diagnosis not present

## 2017-06-30 DIAGNOSIS — Z79899 Other long term (current) drug therapy: Secondary | ICD-10-CM | POA: Diagnosis not present

## 2017-06-30 MED ORDER — LOSARTAN POTASSIUM-HCTZ 100-25 MG PO TABS: 1.0000 | ORAL_TABLET | Freq: Every day | ORAL | 0 refills | Status: DC

## 2017-06-30 NOTE — Telephone Encounter (Signed)
Recv'd fax from [employer] not covered, can she be switched to preferred Losartan/HCTZ?

## 2017-06-30 NOTE — Telephone Encounter (Signed)
She was already on Olmesartan.  So I do not understand why olmesartan HCT would not be covered. nevertheless i change her to losartan HCT

## 2017-07-03 ENCOUNTER — Ambulatory Visit: Payer: Self-pay | Admitting: Internal Medicine

## 2017-07-03 NOTE — Progress Notes (Deleted)
New Outpatient Visit Date: 07/03/2017  Referring Provider: Jac Canavan, PA-C [address] [place], [place] [address]  Chief Complaint: ***  HPI:  Cheyenne Alvarez is a 52 y.o. female who is being seen today for the evaluation of elevated blood pressure at the request of Mr. Tysinger. She has a history of coronary artery disease (details unknown, previously followed by Dr. Sharyn Lull), hypertension, diabetes mellitus, bipolar disorder, schizophrenia, GERD, obesity, and tobacco abuse. ***  --------------------------------------------------------------------------------------------------  Cardiovascular History & Procedures: Cardiovascular Problems:  ***  Risk Factors:  ***  Cath/PCI:  ***  CV Surgery:  ***  EP Procedures and Devices:  ***  Non-Invasive Evaluation(s):  ***  Recent CV Pertinent Labs: Lab Results  Component Value Date   CHOL 199 06/28/2017   HDL 50 06/28/2017   LDLCALC 121 (H) 06/28/2017   TRIG 141 06/28/2017   CHOLHDL 4.0 06/28/2017   CHOLHDL 3.6 10/04/2016   INR 1.07 06/14/2010   BNP 17.0 07/15/2015   K 4.0 06/28/2017   MG 1.5 06/26/2015   BUN 8 06/28/2017   CREATININE 1.05 (H) 06/28/2017   CREATININE 0.90 10/04/2016    --------------------------------------------------------------------------------------------------  Past Medical History:  Diagnosis Date  . Alopecia   . Anemia   . Anxiety   . Bipolar 1 disorder (HCC)   . COPD (chronic obstructive pulmonary disease) (HCC)   . Coronary artery disease   . Depression   . Diabetes mellitus 2010  . Gastritis   . GERD (gastroesophageal reflux disease)   . Headache    migraines  . Hypertension 2010  . Obesity   . Pneumonia   . PONV (postoperative nausea and vomiting)   . PTSD (post-traumatic stress disorder)   . Schizophrenia (HCC)   . Tobacco use     Past Surgical History:  Procedure Laterality Date  . ABDOMINAL HYSTERECTOMY    . CARDIAC CATHETERIZATION    .  CHOLECYSTECTOMY    . COLONOSCOPY  05/14/14   hemorrhoids, otherwise normal; Dr. Charlott Rakes  . COLONOSCOPY WITH PROPOFOL N/A 05/14/2014   Procedure: COLONOSCOPY WITH PROPOFOL;  Surgeon: Charlott Rakes, MD;  Location: [facility] ENDOSCOPY;  Service: Endoscopy;  Laterality: N/A;  . ESOPHAGOGASTRODUODENOSCOPY (EGD) WITH PROPOFOL N/A 05/14/2014   Procedure: ESOPHAGOGASTRODUODENOSCOPY (EGD) WITH PROPOFOL;  Surgeon: Charlott Rakes, MD;  Location: [facility] ENDOSCOPY;  Service: Endoscopy;  Laterality: N/A;  . FRACTURE SURGERY     left leg  . LEG SURGERY    . TUBAL LIGATION      No outpatient medications have been marked as taking for the 07/03/17 encounter (Appointment) with Taiga Lupinacci, Cheyenne De[place], MD.    Allergies: Dexilant [dexlansoprazole]; Famotidine; Meperidine hcl; Dilaudid [hydromorphone hcl]; Gabapentin; Hydrocodone; Ramipril; Ziprasidone hcl; Invokana [canagliflozin]; Iohexol; and Tape  Social History   Tobacco Use  . Smoking status: Former Smoker    Last attempt to quit: 12/18/2013    Years since quitting: 3.5  . Smokeless tobacco: Never Used  Substance Use Topics  . Alcohol use: No    Alcohol/week: 0.0 oz  . Drug use: No    Family History  Problem Relation Age of Onset  . Diabetes Mother   . Kidney failure Mother   . Diabetes Other   . Cancer Other   . Hypertension Other   . Breast cancer Sister 78  . Breast cancer Maternal Grandmother     Review of Systems: A 12-system review of systems was performed and was negative except as noted in the HPI.  --------------------------------------------------------------------------------------------------  Physical Exam: There were no vitals  taken for this visit.  General:  *** HEENT: No conjunctival pallor or scleral icterus. Moist mucous membranes. OP clear. Neck: Supple without lymphadenopathy, thyromegaly, JVD, or HJR. No carotid bruit. Lungs: Normal work of breathing. Clear to auscultation bilaterally without wheezes or  crackles. Heart: Regular rate and rhythm without murmurs, rubs, or gallops. Non-displaced PMI. Abd: Bowel sounds present. Soft, NT/ND without hepatosplenomegaly Ext: No lower extremity edema. Radial, PT, and DP pulses are 2+ bilaterally Skin: Warm and dry without rash. Neuro: CNIII-XII intact. Strength and fine-touch sensation intact in upper and lower extremities bilaterally. Psych: Normal mood and affect.  EKG:  ***  Lab Results  Component Value Date   WBC 9.5 06/28/2017   HGB 11.8 06/28/2017   HCT 36.3 06/28/2017   MCV 82 06/28/2017   PLT 307 06/28/2017    Lab Results  Component Value Date   NA 138 06/28/2017   K 4.0 06/28/2017   CL 101 06/28/2017   CO2 22 06/28/2017   BUN 8 06/28/2017   CREATININE 1.05 (H) 06/28/2017   GLUCOSE 196 (H) 06/28/2017   ALT 12 06/28/2017    Lab Results  Component Value Date   CHOL 199 06/28/2017   HDL 50 06/28/2017   LDLCALC 121 (H) 06/28/2017   TRIG 141 06/28/2017   CHOLHDL 4.0 06/28/2017     --------------------------------------------------------------------------------------------------  ASSESSMENT AND PLAN: Cheyenne Alvarez Dvaughn Fickle, MD 07/03/2017 7:42 AM

## 2017-07-03 NOTE — Telephone Encounter (Signed)
Called pt and informed

## 2017-07-03 NOTE — Telephone Encounter (Signed)
Pt was referred to  Cardiology.

## 2017-07-12 ENCOUNTER — Other Ambulatory Visit: Payer: Self-pay | Admitting: Medical

## 2017-07-12 ENCOUNTER — Telehealth: Payer: Self-pay | Admitting: Medical

## 2017-07-12 ENCOUNTER — Telehealth: Payer: Self-pay

## 2017-07-12 NOTE — Telephone Encounter (Signed)
If patient is taking this twice daily, should the quantity be 60 instead of 40 for a refill? Please advise.

## 2017-07-12 NOTE — Telephone Encounter (Signed)
Received fax refill request for Atenolol 50 mg tabs.  Patient was seen here on 06-28-17 and referred to Cardiology Dr Sharyn Lull.  Is this of ok to refill?   Please advise

## 2017-07-12 NOTE — Telephone Encounter (Signed)
We receive refill request.  I changed to Protonix reflux medication once instead of BID for safety reasons.

## 2017-07-14 ENCOUNTER — Other Ambulatory Visit: Payer: Self-pay | Admitting: Medical

## 2017-07-14 MED ORDER — ATENOLOL 50 MG PO TABS: 50.0000 mg | ORAL_TABLET | Freq: Two times a day (BID) | ORAL | 0 refills | Status: DC

## 2017-07-14 NOTE — Telephone Encounter (Signed)
I sent refill but request Dr. Sharyn Lull records again as I haven't seem them

## 2017-07-18 NOTE — Telephone Encounter (Signed)
Patient has appointment 07-26-17 at 10:30 with [facility].

## 2017-07-26 ENCOUNTER — Ambulatory Visit: Payer: Self-pay | Admitting: Family Medicine

## 2017-07-26 NOTE — Telephone Encounter (Signed)
noted 

## 2017-07-27 ENCOUNTER — Ambulatory Visit (INDEPENDENT_AMBULATORY_CARE_PROVIDER_SITE_OTHER): Payer: Medicare Other | Admitting: Medical

## 2017-07-27 VITALS — BP 130/80 | HR 92 | Resp 16 | Ht 64.0 in | Wt 194.2 lb

## 2017-07-27 DIAGNOSIS — Z789 Other specified health status: Secondary | ICD-10-CM

## 2017-07-27 DIAGNOSIS — Z659 Problem related to unspecified psychosocial circumstances: Secondary | ICD-10-CM | POA: Diagnosis not present

## 2017-07-27 DIAGNOSIS — J449 Chronic obstructive pulmonary disease, unspecified: Secondary | ICD-10-CM

## 2017-07-27 DIAGNOSIS — R6889 Other general symptoms and signs: Secondary | ICD-10-CM

## 2017-07-27 DIAGNOSIS — Z79899 Other long term (current) drug therapy: Secondary | ICD-10-CM | POA: Diagnosis not present

## 2017-07-27 DIAGNOSIS — F319 Bipolar disorder, unspecified: Secondary | ICD-10-CM

## 2017-07-27 DIAGNOSIS — I10 Essential (primary) hypertension: Secondary | ICD-10-CM | POA: Diagnosis not present

## 2017-07-27 DIAGNOSIS — IMO0001 Reserved for inherently not codable concepts without codable children: Secondary | ICD-10-CM

## 2017-07-27 DIAGNOSIS — R251 Tremor, unspecified: Secondary | ICD-10-CM | POA: Diagnosis not present

## 2017-07-27 DIAGNOSIS — R419 Unspecified symptoms and signs involving cognitive functions and awareness: Secondary | ICD-10-CM

## 2017-07-27 DIAGNOSIS — Z794 Long term (current) use of insulin: Secondary | ICD-10-CM

## 2017-07-27 DIAGNOSIS — E119 Type 2 diabetes mellitus without complications: Secondary | ICD-10-CM | POA: Diagnosis not present

## 2017-07-27 DIAGNOSIS — E785 Hyperlipidemia, unspecified: Secondary | ICD-10-CM | POA: Diagnosis not present

## 2017-07-27 DIAGNOSIS — F250 Schizoaffective disorder, bipolar type: Secondary | ICD-10-CM

## 2017-07-27 DIAGNOSIS — R32 Unspecified urinary incontinence: Secondary | ICD-10-CM | POA: Diagnosis not present

## 2017-07-27 NOTE — Patient Instructions (Signed)
Recommendations  We scheduled you an appointment with Dr. Sharyn Lull on Monday, June 17 at 9 AM  Please call and cancel the appointment that you have written down at home for a different cardiologist  I strongly recommend you call from the pharmacy or another hometown pharmacy about doing a monthly bubble wrap medication delivery to make things easier for you  Your blood sugars are not where they need to be and I am concerned about whether you are taking her insulins properly.  I would like you to reconsider the referring you to a different diabetes specialist.  I will ask again about this the next time I see you  Diabetes  Continue mealtime insulin HumaLog 15 units 3 times daily with meals as long was pre-meal glucose is greater than 130  Increase Tresiba long-acting nighttime insulin to 55 units  Continue Metformin XR 500 mg, 2 tablets daily  High cholesterol   continue Crestor 20 mg daily at bedtime  High blood pressure  Continue atenolol 50 mg twice daily  Continue amlodipine 10 mg once daily  Continue losartan HCT 100/25 mg daily

## 2017-07-27 NOTE — Progress Notes (Signed)
Subjective: Chief Complaint  Patient presents with  . follow up    labs   Here for f/u from 06/28/17 visit and lab review.,   At her recent visit HgbA1C was 10.4%, LDL 121, glucose elevated.   Last visit we stopped Lasix given excess urination concerns and the fact she uses adult diapers.   We changed from Benicar to Benicar HCT, reiterated need to be compliant with Crestor.   We increased Tresiba to 50u QHS.   Last visit she was only using meal time insulin once daily.  We advised she add back to 15u TID.   She was advised to bring in glucose readings in 6mo.  We also referred back to Dr. Sharyn Lull cardiology.  She notes that she got a call from [place] for referral to different cardiologist  She notes improvements in urination frequency on the Benicar HCT  She does not have her sugar readings with her.   Checking sugars in the morning.  This morning sugar was 180.    Her aid still comes daily to help with ADLs and she also helps with her medications to make sure she has the right pills at the right time.   She refuses endocrinology referral.     Past Medical History:  Diagnosis Date  . Alopecia   . Anemia   . Anxiety   . Bipolar 1 disorder (HCC)   . COPD (chronic obstructive pulmonary disease) (HCC)   . Coronary artery disease   . Depression   . Diabetes mellitus 2010  . Gastritis   . GERD (gastroesophageal reflux disease)   . Headache    migraines  . Hypertension 2010  . Obesity   . Pneumonia   . PONV (postoperative nausea and vomiting)   . PTSD (post-traumatic stress disorder)   . Schizophrenia (HCC)   . Tobacco use    Current Outpatient Medications on File Prior to Visit  Medication Sig Dispense Refill  . albuterol (PROVENTIL HFA;VENTOLIN HFA) 108 (90 Base) MCG/ACT inhaler Inhale 2 puffs into the lungs every 6 (six) hours as needed for wheezing. For wheezing 18 g 1  . ALPRAZolam (XANAX) 1 MG tablet Take 1 mg by mouth 3 (three) times daily. FOR ANXIETY  0  . amantadine  (SYMMETREL) 100 MG capsule Take 100 mg by mouth 2 (two) times daily. FOR TREMORS  0  . amLODipine (NORVASC) 10 MG tablet Take 1 tablet (10 mg total) by mouth daily. 90 tablet 0  . atenolol (TENORMIN) 50 MG tablet Take 1 tablet (50 mg total) by mouth 2 (two) times daily. 60 tablet 0  . AUSTEDO 6 MG TABS Take 6 mg by mouth 2 (two) times daily.  0  . benztropine (COGENTIN) 2 MG tablet Take 2 mg by mouth 2 (two) times daily. FOR TREMORS AND MOUTH MOVEMENTS  0  . busPIRone (BUSPAR) 15 MG tablet Take 15 mg by mouth 2 (two) times daily. Reported on 04/20/2015    . diphenhydrAMINE (BENADRYL) 25 mg capsule Take 50 mg by mouth at bedtime. Reported on 04/07/2015    . FANAPT 6 MG TABS   0  . FLUoxetine (PROZAC) 40 MG capsule Take 40 mg by mouth daily.  0  . Fluticasone-Salmeterol (ADVAIR DISKUS) 250-50 MCG/DOSE AEPB Inhale 1 puff into the lungs 2 (two) times daily. 60 each 5  . glucose blood test strip 1 each by Other route 3 (three) times daily. Use as instructed 100 each 5  . insulin degludec (TRESIBA FLEXTOUCH) 100 UNIT/ML SOPN  FlexTouch Pen Inject 0.5 mLs (50 Units total) into the skin daily. 9 mL 5  . insulin lispro (HUMALOG KWIKPEN) 100 UNIT/ML KiwkPen Inject 0.25 mLs (25 Units total) into the skin 3 (three) times daily. 30 mL 5  . Insulin Pen Needle (NOVOFINE PLUS) 32G X 4 MM MISC USE TWICE DAILY 100 each 5  . INVEGA TRINZA 819 MG/2.625ML SUSP   0  . losartan-hydrochlorothiazide (HYZAAR) 100-25 MG tablet Take 1 tablet by mouth daily. 90 tablet 0  . loxapine (LOXITANE) 50 MG capsule Take 50 mg by mouth at bedtime.  0  . metFORMIN (GLUCOPHAGE XR) 500 MG 24 hr tablet Take 2 tablets (1,000 mg total) by mouth daily. 180 tablet 1  . nitroGLYCERIN (NITROSTAT) 0.4 MG SL tablet Place 0.4 mg under the tongue every 5 (five) minutes as needed for chest pain. Reported on 07/15/2015    . olmesartan-hydrochlorothiazide (BENICAR HCT) 40-25 MG tablet Take 1 tablet by mouth daily. 90 tablet 0  . pantoprazole (PROTONIX) 40  MG tablet TAKE 1 TABLET BY MOUTH TWICE A DAY 30 tablet 2  . rosuvastatin (CRESTOR) 20 MG tablet Take 1 tablet (20 mg total) by mouth daily. 90 tablet 3  . zolpidem (AMBIEN) 10 MG tablet Take 1 tablet by mouth at bedtime.   0  . fluvoxaMINE (LUVOX) 25 MG tablet Take 25 mg by mouth 2 (two) times daily.     No current facility-administered medications on file prior to visit.    Past Surgical History:  Procedure Laterality Date  . ABDOMINAL HYSTERECTOMY    . CARDIAC CATHETERIZATION    . CHOLECYSTECTOMY    . COLONOSCOPY  05/14/14   hemorrhoids, otherwise normal; Dr. Charlott Rakes  . COLONOSCOPY WITH PROPOFOL N/A 05/14/2014   Procedure: COLONOSCOPY WITH PROPOFOL;  Surgeon: Charlott Rakes, MD;  Location: [facility] ENDOSCOPY;  Service: Endoscopy;  Laterality: N/A;  . ESOPHAGOGASTRODUODENOSCOPY (EGD) WITH PROPOFOL N/A 05/14/2014   Procedure: ESOPHAGOGASTRODUODENOSCOPY (EGD) WITH PROPOFOL;  Surgeon: Charlott Rakes, MD;  Location: [facility] ENDOSCOPY;  Service: Endoscopy;  Laterality: N/A;  . FRACTURE SURGERY     left leg  . LEG SURGERY    . TUBAL LIGATION       ROS as in subjective    Objective: BP 130/80   Pulse 92   Resp 16   Ht  (1.626 m)   Wt 194 lb 3.2 oz (88.1 kg)   SpO2 98%   BMI 33.33 kg/m   Wt Readings from Last 3 Encounters:  07/27/17 194 lb 3.2 oz (88.1 kg)  06/28/17 198 lb (89.8 kg)  02/17/17 227 lb (103 kg)   BP Readings from Last 3 Encounters:  07/27/17 130/80  06/28/17 (!) 150/86  02/17/17 (!) 167/97   Gen: wd, wn, nad Psych: flat affect Mild resting tremor noted    Assessment: Encounter Diagnoses  Name Primary?  . IDDM (insulin dependent diabetes mellitus) (HCC) Yes  . Cognitive safety issue   . Psychosocial impairment   . Chronic obstructive pulmonary disease, unspecified COPD type (HCC)   . Essential hypertension   . Bipolar affective disorder, remission status unspecified (HCC)   . Decreased activities of daily living (ADL)   . Schizoaffective  disorder, bipolar type (HCC)   . Hyperlipidemia, unspecified hyperlipidemia type   . Incontinence in female   . Tremor   . High risk medication use     Plan: I continue to have concerns about her ability to handle her medications appropriately.  Her nurse aide still comes every day  and helps her with ADLs as well as her medications.  Her family has mental health issues just as she has significant mental health issues, so family is likely not reliable in this case.  In the past when I have recommended more of a structured environment such as assisted living she has been very reluctant to even consider something like this.  Although she endorses that she is taking her medicines as prescribed I doubt her compliance is that accurate.  She again refuses referral to endocrinology today  We will continue to do the best we can do with her medication management given the limitations we have  She still has significant tremor and flat affect on the regimen of psychotropic medications she is taking through psychiatry.  The tremor started in recent months when she had new medications added on.  I advised she follow-up with psychiatry on this  We straightened out her cardiology referral today, and she will be seeing Dr. Sharyn Lull on June 17 at 9 AM  Recommendations  We scheduled you an appointment with Dr. Sharyn Lull on Monday, June 17 at 9 AM  Please call and cancel the appointment that you have written down at home for a different cardiologist  I strongly recommend you call from the pharmacy or another hometown pharmacy about doing a monthly bubble wrap medication delivery to make things easier for you  Your blood sugars are not where they need to be and I am concerned about whether you are taking her insulins properly.  I would like you to reconsider the referring you to a different diabetes specialist.  I will ask again about this the next time I see you  Diabetes  Continue mealtime insulin HumaLog  15 units 3 times daily with meals as long was pre-meal glucose is greater than 130  Increase Tresiba long-acting nighttime insulin to 55 units  Continue Metformin XR 500 mg, 2 tablets daily  High cholesterol   continue Crestor 20 mg daily at bedtime  High blood pressure  Continue atenolol 50 mg twice daily  Continue amlodipine 10 mg once daily  Continue losartan HCT 100/25 mg daily  Aleenah was seen today for follow up.  Diagnoses and all orders for this visit:  IDDM (insulin dependent diabetes mellitus) (HCC)  Cognitive safety issue  Psychosocial impairment  Chronic obstructive pulmonary disease, unspecified COPD type (HCC)  Essential hypertension  Bipolar affective disorder, remission status unspecified (HCC)  Decreased activities of daily living (ADL)  Schizoaffective disorder, bipolar type (HCC)  Hyperlipidemia, unspecified hyperlipidemia type  Incontinence in female  Tremor  High risk medication use   Spent > 45 minutes face to face with patient in discussion of symptoms, evaluation, plan and recommendations.    F/u 80mo on diabetes

## 2017-08-10 ENCOUNTER — Ambulatory Visit: Payer: Self-pay | Admitting: Internal Medicine

## 2017-08-14 DIAGNOSIS — K219 Gastro-esophageal reflux disease without esophagitis: Secondary | ICD-10-CM | POA: Diagnosis not present

## 2017-08-14 DIAGNOSIS — F319 Bipolar disorder, unspecified: Secondary | ICD-10-CM | POA: Diagnosis not present

## 2017-08-14 DIAGNOSIS — I2510 Atherosclerotic heart disease of native coronary artery without angina pectoris: Secondary | ICD-10-CM | POA: Diagnosis not present

## 2017-08-14 DIAGNOSIS — I10 Essential (primary) hypertension: Secondary | ICD-10-CM | POA: Diagnosis not present

## 2017-08-14 DIAGNOSIS — E119 Type 2 diabetes mellitus without complications: Secondary | ICD-10-CM | POA: Diagnosis not present

## 2017-08-14 DIAGNOSIS — F419 Anxiety disorder, unspecified: Secondary | ICD-10-CM | POA: Diagnosis not present

## 2017-08-14 DIAGNOSIS — R0789 Other chest pain: Secondary | ICD-10-CM | POA: Diagnosis not present

## 2017-08-14 DIAGNOSIS — E785 Hyperlipidemia, unspecified: Secondary | ICD-10-CM | POA: Diagnosis not present

## 2017-08-27 ENCOUNTER — Other Ambulatory Visit: Payer: Self-pay | Admitting: Medical

## 2017-08-29 DIAGNOSIS — F200 Paranoid schizophrenia: Secondary | ICD-10-CM | POA: Diagnosis not present

## 2017-08-29 DIAGNOSIS — Z79899 Other long term (current) drug therapy: Secondary | ICD-10-CM | POA: Diagnosis not present

## 2017-09-02 ENCOUNTER — Other Ambulatory Visit: Payer: Self-pay | Admitting: Medical

## 2017-09-04 NOTE — Telephone Encounter (Signed)
Is this ok to refill?  

## 2017-09-28 ENCOUNTER — Other Ambulatory Visit: Payer: Self-pay | Admitting: Medical

## 2017-09-28 NOTE — Telephone Encounter (Signed)
Is this ok to refill?  

## 2017-10-06 ENCOUNTER — Other Ambulatory Visit: Payer: Self-pay | Admitting: Medical

## 2017-10-06 NOTE — Telephone Encounter (Signed)
Pt as an appt next week.

## 2017-10-09 ENCOUNTER — Other Ambulatory Visit: Payer: Self-pay | Admitting: Medical

## 2017-10-09 DIAGNOSIS — Z79899 Other long term (current) drug therapy: Secondary | ICD-10-CM | POA: Diagnosis not present

## 2017-10-09 DIAGNOSIS — F250 Schizoaffective disorder, bipolar type: Secondary | ICD-10-CM | POA: Diagnosis not present

## 2017-10-11 ENCOUNTER — Encounter: Payer: Self-pay | Admitting: Medical

## 2017-10-11 ENCOUNTER — Ambulatory Visit: Payer: Medicare Other | Admitting: Medical

## 2017-10-11 ENCOUNTER — Ambulatory Visit (INDEPENDENT_AMBULATORY_CARE_PROVIDER_SITE_OTHER): Payer: Medicare Other | Admitting: Medical

## 2017-10-11 VITALS — BP 134/78 | HR 80 | Temp 98.2°F | Ht 64.0 in | Wt 200.6 lb

## 2017-10-11 DIAGNOSIS — I10 Essential (primary) hypertension: Secondary | ICD-10-CM | POA: Diagnosis not present

## 2017-10-11 DIAGNOSIS — F411 Generalized anxiety disorder: Secondary | ICD-10-CM | POA: Diagnosis not present

## 2017-10-11 DIAGNOSIS — E1165 Type 2 diabetes mellitus with hyperglycemia: Secondary | ICD-10-CM | POA: Diagnosis not present

## 2017-10-11 DIAGNOSIS — T464X5D Adverse effect of angiotensin-converting-enzyme inhibitors, subsequent encounter: Secondary | ICD-10-CM | POA: Diagnosis not present

## 2017-10-11 DIAGNOSIS — F4310 Post-traumatic stress disorder, unspecified: Secondary | ICD-10-CM

## 2017-10-11 DIAGNOSIS — F319 Bipolar disorder, unspecified: Secondary | ICD-10-CM | POA: Diagnosis not present

## 2017-10-11 DIAGNOSIS — Z79899 Other long term (current) drug therapy: Secondary | ICD-10-CM

## 2017-10-11 DIAGNOSIS — E119 Type 2 diabetes mellitus without complications: Secondary | ICD-10-CM

## 2017-10-11 DIAGNOSIS — E785 Hyperlipidemia, unspecified: Secondary | ICD-10-CM | POA: Diagnosis not present

## 2017-10-11 DIAGNOSIS — T783XXD Angioneurotic edema, subsequent encounter: Secondary | ICD-10-CM

## 2017-10-11 DIAGNOSIS — Z794 Long term (current) use of insulin: Secondary | ICD-10-CM

## 2017-10-11 DIAGNOSIS — IMO0001 Reserved for inherently not codable concepts without codable children: Secondary | ICD-10-CM

## 2017-10-11 LAB — COMPREHENSIVE METABOLIC PANEL
ALT: 13 IU/L (ref 0–32)
AST: 13 IU/L (ref 0–40)
Albumin/Globulin Ratio: 1.1 — ABNORMAL LOW (ref 1.2–2.2)
Albumin: 3.5 g/dL (ref 3.5–5.5)
Alkaline Phosphatase: 114 IU/L (ref 39–117)
BUN/Creatinine Ratio: 11 (ref 9–23)
BUN: 9 mg/dL (ref 6–24)
Bilirubin Total: <0.2 mg/dL (ref 0.0–1.2)
CO2: 24 mmol/L (ref 20–29)
Calcium: 9 mg/dL (ref 8.7–10.2)
Chloride: 97 mmol/L (ref 96–106)
Creatinine, Ser: 0.84 mg/dL (ref 0.57–1.00)
GFR calc Af Amer: 92 mL/min/{1.73_m2} (ref >59)
GFR calc non Af Amer: 80 mL/min/{1.73_m2} (ref >59)
Globulin, Total: 3.2 g/dL (ref 1.5–4.5)
Glucose: 337 mg/dL — ABNORMAL HIGH (ref 65–99)
Potassium: 4.5 mmol/L (ref 3.5–5.2)
Sodium: 132 mmol/L — ABNORMAL LOW (ref 134–144)
Total Protein: 6.7 g/dL (ref 6.0–8.5)

## 2017-10-11 LAB — HEMOGLOBIN A1C
ESTIMATED AVERAGE GLUCOSE: 229 mg/dL
Hgb A1c MFr Bld: 9.6 % — ABNORMAL HIGH (ref 4.8–5.6)

## 2017-10-11 LAB — LIPID PANEL
CHOLESTEROL TOTAL: 182 mg/dL (ref 100–199)
Chol/HDL Ratio: 2.9 ratio (ref 0.0–4.4)
HDL: 63 mg/dL (ref >39)
LDL Calculated: 101 mg/dL — ABNORMAL HIGH (ref 0–99)
Triglycerides: 90 mg/dL (ref 0–149)
VLDL CHOLESTEROL CAL: 18 mg/dL (ref 5–40)

## 2017-10-11 MED ORDER — ATENOLOL 50 MG PO TABS: ORAL_TABLET | ORAL | 1 refills | Status: DC

## 2017-10-11 MED ORDER — AMLODIPINE BESYLATE 10 MG PO TABS: ORAL_TABLET | ORAL | 1 refills | Status: DC

## 2017-10-11 MED ORDER — INSULIN PEN NEEDLE 32G X 4 MM MISC: 5 refills | Status: DC

## 2017-10-11 MED ORDER — INSULIN LISPRO 100 UNIT/ML (KWIKPEN): 25.0000 [IU] | PEN_INJECTOR | Freq: Three times a day (TID) | SUBCUTANEOUS | 5 refills | Status: DC

## 2017-10-11 MED ORDER — INSULIN GLARGINE-LIXISENATIDE 100-33 UNT-MCG/ML ~~LOC~~ SOPN: 30.0000 [IU] | PEN_INJECTOR | Freq: Every day | SUBCUTANEOUS | 5 refills | Status: DC

## 2017-10-11 MED ORDER — PANTOPRAZOLE SODIUM 40 MG PO TBEC: 40.0000 mg | DELAYED_RELEASE_TABLET | Freq: Two times a day (BID) | ORAL | 1 refills | Status: DC

## 2017-10-11 NOTE — Patient Instructions (Signed)
Recommendations  Diabetes  Continue mealtime insulin Humalog, but increase to 20 units 3 times daily with meals as long was pre-meal glucose is greater than 130  Begin samples of Soliqua 100/33 injection, start 30 u daily at bedtime.   This will replace Guinea-Bissauresiba long acting insulin if the Lawana ChambersSoliqua is covered by insurance  Continue Metformin XR 500 mg, 2 tablets daily  You can increase Soliqua 2 units weekly if morning sugars aren't running under 130  High cholesterol   continue Crestor 20 mg daily at bedtime  Cut back on meat intake, cut back on hotdogs  High blood pressure  Continue atenolol 50 mg twice daily  Continue amlodipine 10 mg once daily  Continue losartan HCT 100/25 mg daily

## 2017-10-11 NOTE — Progress Notes (Signed)
Subjective: Chief Complaint  Patient presents with  . Diabetes   Here for med check on diabetes, HTN, hyperlipidemia  She reports compliance with medication.  Lately seeing 300s glucose on regular basis.  Walks daily for exercise Last visit we continues Metformin XR 500mg  2 tablets daily, we increase Tresiba to 55u, and we reiterated need to use Humalog 15 TID, but at the time she was using 25u in the morning only.    She reports compliance today.  No other new issues.  Sees her family on Sundays.  Sees psychiatry regularly.  Past Medical History:  Diagnosis Date  . Alopecia   . Anemia   . Anxiety   . Bipolar 1 disorder (HCC)   . COPD (chronic obstructive pulmonary disease) (HCC)   . Coronary artery disease   . Depression   . Diabetes mellitus 2010  . Gastritis   . GERD (gastroesophageal reflux disease)   . Headache    migraines  . Hypertension 2010  . Obesity   . Pneumonia   . PONV (postoperative nausea and vomiting)   . PTSD (post-traumatic stress disorder)   . Schizophrenia (HCC)   . Tobacco use    Current Outpatient Medications on File Prior to Visit  Medication Sig Dispense Refill  . albuterol (PROVENTIL HFA;VENTOLIN HFA) 108 (90 Base) MCG/ACT inhaler INHALE 2 PUFFS INTO THE LUNGS EVERY 6 HOURS AS NEEDED FOR WHEEZING 18 g 0  . ALPRAZolam (XANAX) 1 MG tablet Take 1 mg by mouth 3 (three) times daily. FOR ANXIETY  0  . amantadine (SYMMETREL) 100 MG capsule Take 100 mg by mouth 2 (two) times daily. FOR TREMORS  0  . AUSTEDO 6 MG TABS Take 6 mg by mouth 2 (two) times daily.  0  . benztropine (COGENTIN) 2 MG tablet Take 2 mg by mouth 2 (two) times daily. FOR TREMORS AND MOUTH MOVEMENTS  0  . busPIRone (BUSPAR) 15 MG tablet Take 15 mg by mouth 2 (two) times daily. Reported on 04/20/2015    . diphenhydrAMINE (BENADRYL) 25 mg capsule Take 50 mg by mouth at bedtime. Reported on 04/07/2015    . FANAPT 6 MG TABS   0  . FLUoxetine (PROZAC) 40 MG capsule Take 40 mg by mouth daily.   0  . Fluticasone-Salmeterol (ADVAIR DISKUS) 250-50 MCG/DOSE AEPB Inhale 1 puff into the lungs 2 (two) times daily. 60 each 5  . fluvoxaMINE (LUVOX) 25 MG tablet Take 25 mg by mouth 2 (two) times daily.    Marland Kitchen. glucose blood test strip 1 each by Other route 3 (three) times daily. Use as instructed 100 each 5  . INVEGA TRINZA 819 MG/2.625ML SUSP   0  . losartan-hydrochlorothiazide (HYZAAR) 100-25 MG tablet TAKE 1 TABLET BY MOUTH DAILY 90 tablet 0  . loxapine (LOXITANE) 25 MG capsule TAKE 1 TO 2 CAPSULES BY MOUTH  1  . metFORMIN (GLUCOPHAGE XR) 500 MG 24 hr tablet Take 2 tablets (1,000 mg total) by mouth daily. 180 tablet 1  . nitroGLYCERIN (NITROSTAT) 0.4 MG SL tablet Place 0.4 mg under the tongue every 5 (five) minutes as needed for chest pain. Reported on 07/15/2015    . rosuvastatin (CRESTOR) 20 MG tablet Take 1 tablet (20 mg total) by mouth daily. 90 tablet 3  . VRAYLAR capsule TK 1 C PO HS  1  . zolpidem (AMBIEN) 10 MG tablet Take 1 tablet by mouth at bedtime.   0   No current facility-administered medications on file prior to visit.  ROS as in subjective     Objective: BP 134/78   Pulse 80   Temp 98.2 F (36.8 C) (Oral)   Ht 5\' 4"  (1.626 m)   Wt 200 lb 9.6 oz (91 kg)   SpO2 99%   BMI 34.43 kg/m   General appearance: alert, no distress, WD/WN,  Neck: supple, no lymphadenopathy, no thyromegaly, no masses Heart: RRR, normal S1, S2, no murmurs Lungs: CTA bilaterally, no wheezes, rhonchi, or rales Pulses: 2+ symmetric, upper and lower extremities, normal cap refill No edema of extremities     Assessment: Encounter Diagnoses  Name Primary?  . IDDM (insulin dependent diabetes mellitus) (HCC) Yes  . Uncontrolled type 2 diabetes mellitus with hyperglycemia (HCC)   . Angiotensin converting enzyme inhibitor-aggravated angioedema, subsequent encounter   . Bipolar affective disorder, remission status unspecified (HCC)   . Essential hypertension   . Hyperlipidemia, unspecified  hyperlipidemia type   . High risk medication use   . Generalized anxiety disorder   . POST TRAUMATIC STRESS SYNDROME     Plan: Discussed current medications.  Counseled on diet, exercise, medications.  Begin trial of Soliqua to replace Guinea-Bissauresiba if covered by insurance.  increased Humalog to 20u with meals  We will request recent visit notes with cardiology, Dr. Sharyn LullHarwani.  Recommendations  Diabetes  Continue mealtime insulin Humalog, but increase to 20 units 3 times daily with meals as long was pre-meal glucose is greater than 130  Begin samples of Soliqua 100/33 injection, start 30 u daily at bedtime.   This will replace Guinea-Bissauresiba long acting insulin if the Lawana ChambersSoliqua is covered by insurance  Continue Metformin XR 500 mg, 2 tablets daily  You can increase Soliqua 2 units weekly if morning sugars aren't running under 130  High cholesterol   continue Crestor 20 mg daily at bedtime  Cut back on meat intake, cut back on hotdogs  High blood pressure  Continue atenolol 50 mg twice daily  Continue amlodipine 10 mg once daily  Continue losartan HCT 100/25 mg daily   Shamika was seen today for diabetes.  Diagnoses and all orders for this visit:  IDDM (insulin dependent diabetes mellitus) (HCC) -     Comprehensive metabolic panel -     Hemoglobin A1c -     Lipid panel  Uncontrolled type 2 diabetes mellitus with hyperglycemia (HCC)  Angiotensin converting enzyme inhibitor-aggravated angioedema, subsequent encounter  Bipolar affective disorder, remission status unspecified (HCC)  Essential hypertension -     Comprehensive metabolic panel  Hyperlipidemia, unspecified hyperlipidemia type -     Comprehensive metabolic panel -     Lipid panel  High risk medication use  Generalized anxiety disorder  POST TRAUMATIC STRESS SYNDROME  Other orders -     amLODipine (NORVASC) 10 MG tablet; TAKE 1 TABLET(10 MG) BY MOUTH DAILY -     atenolol (TENORMIN) 50 MG tablet; TAKE 1  TABLET(50 MG) BY MOUTH TWICE DAILY -     pantoprazole (PROTONIX) 40 MG tablet; Take 1 tablet (40 mg total) by mouth 2 (two) times daily. -     Insulin Pen Needle (NOVOFINE PLUS) 32G X 4 MM MISC; USE TWICE DAILY -     insulin lispro (HUMALOG KWIKPEN) 100 UNIT/ML KiwkPen; Inject 0.25 mLs (25 Units total) into the skin 3 (three) times daily. -     Insulin Glargine-Lixisenatide (SOLIQUA) 100-33 UNT-MCG/ML SOPN; Inject 30 Units into the skin at bedtime.

## 2017-10-12 ENCOUNTER — Other Ambulatory Visit: Payer: Self-pay | Admitting: Medical

## 2017-10-12 ENCOUNTER — Telehealth: Payer: Self-pay | Admitting: Medical

## 2017-10-12 NOTE — Telephone Encounter (Signed)
Received requested info from Dr. Sharyn LullHarwani. Sending back for review.

## 2017-10-21 ENCOUNTER — Other Ambulatory Visit: Payer: Self-pay | Admitting: Family Medicine

## 2017-10-23 NOTE — Telephone Encounter (Signed)
[facility] is requesting to fill pt albuterol. Please advise KH °

## 2017-10-26 ENCOUNTER — Encounter: Payer: Self-pay | Admitting: Medical

## 2017-10-26 ENCOUNTER — Ambulatory Visit (INDEPENDENT_AMBULATORY_CARE_PROVIDER_SITE_OTHER): Payer: Medicare Other | Admitting: Medical

## 2017-10-26 VITALS — BP 130/76 | HR 78 | Temp 98.0°F | Resp 16 | Ht 64.0 in | Wt 198.0 lb

## 2017-10-26 DIAGNOSIS — E785 Hyperlipidemia, unspecified: Secondary | ICD-10-CM | POA: Diagnosis not present

## 2017-10-26 DIAGNOSIS — Z79899 Other long term (current) drug therapy: Secondary | ICD-10-CM | POA: Diagnosis not present

## 2017-10-26 DIAGNOSIS — E1165 Type 2 diabetes mellitus with hyperglycemia: Secondary | ICD-10-CM | POA: Diagnosis not present

## 2017-10-26 DIAGNOSIS — E119 Type 2 diabetes mellitus without complications: Secondary | ICD-10-CM | POA: Diagnosis not present

## 2017-10-26 DIAGNOSIS — Z794 Long term (current) use of insulin: Secondary | ICD-10-CM

## 2017-10-26 DIAGNOSIS — IMO0001 Reserved for inherently not codable concepts without codable children: Secondary | ICD-10-CM

## 2017-10-26 DIAGNOSIS — I10 Essential (primary) hypertension: Secondary | ICD-10-CM | POA: Diagnosis not present

## 2017-10-26 NOTE — Patient Instructions (Signed)
Recommendations  Diabetes  Continue mealtime insulinHumalog 20 units 3 times daily with meals as long as pre-meal glucose is greater than 130  Continue the new Soliqua 100/33 injection, but increase to 40 u daily at bedtime.   This will replace Tresiba long acting insulin  You can increase Soliqua 2 units per week if average blood sugars fasting are >130.  For example, in 1 week if you sugars are still running over 130 fasting like they have been, increase to 42 units weekly  Continue Metformin XR 500 mg, 2 tablets daily  High cholesterol   continue Crestor 20 mg daily at bedtime  Cut back on meat intake, cut back on hotdogs  High blood pressure  Continue atenolol 50 mg twice daily  Continue amlodipine 10 mg once daily  Continue losartan HCT 100/25 mg daily   Type 2 Diabetes  Diabetes is a long-lasting (chronic) disease.  With diabetes, either the pancreas does not make enough of a hormone called insulin, or the body has trouble using the insulin that is made.  Over time, diabetes can damage the eyes, kidneys, and nerves causing retinopathy, nephropathy, and neuropathy.  Diabetes puts you at risk for heart disease and peripheral vascular disease which can lead to heart attack, stroke, foot ulcers, and amputations.    Our goal and hopefully your goal is to manage your diabetes in such a way to slow the progression of the disease and do all we can to keep you healthy  Home Care:   Eat healthy, exercise regularly, limit alcohol, and don't smoke!  Check your blood sugar (glucose) once a day before breakfast, or as indicated by our discussion today.  Take your medications daily, don't run out of medications.  Learn about low blood sugar (hypoglycemia). Know how to treat it.  Wear a necklace or bracelet that says you have diabetes.  Check your feet every night for cuts, sores, blisters, and redness. Tell your medical provider if you have problems.  Maintain a normal  body weight, or normal BMI - height to weight ratio of 20-25.  Ask me about this.  GET HELP RIGHT AWAY IF:  You have trouble keeping your blood sugar in target range.  You have problems with your medicines.  You are sick and not getting better after 24 hours.  You have a sore or wound that is not healing.  You have vision problems or changes.  You have a fever.   Exercise regularly since it has beneficial effects on the heart and blood sugars. Exercising at least three times per week or 150 minutes per week can be as important as medication to a diabetic.  Find some form of exercise that you will enjoy doing regularly.  This can include walking, biking, kayaking, golfing, swimming, dance, aerobics, hiking, etc.  If you have joint problems, many local gyms have equipment to accommodate people with specific needs.    Vaccinations:  Diabetics are at increased risk for infection, and illnesses can take longer to resolve.  Current vaccine recommendations include yearly Influenza (flu) vaccine (recommended in October), Pneumococcal vaccine, Hepatitis B vaccine series, Tdap (tetanus, diptheria and pertussis) vaccine every 10 years, and other age appropriate vaccinations.     Office visits:  We recommend routine medical care to make sure we are addressing prevention and issues as they arise.  Typically this could mean twice yearly or up to quarterly depending upon your unique health situation.  Exams should include a yearly physical, a yearly foot  exam, and other examination as appropriate.  You should see an eye doctor yearly to help screen for and prevent blood vessel complications in your eyes.  Labs: Diabetics should have blood work done at least twice yearly to monitor your Hemoglobin A1C (a three-month average of your blood sugars) and your cholesterol.  You should have your urine and blood checked yearly to screen for kidney damage.  This may include creatinine and micro-albumin levels.  Other  labs as appropriate.    Blood pressure goals:  Goal blood pressure in diabetics should be 130/80 or less. Monitoring your blood pressure with a home blood pressure cuff of your own is an excellent idea.  If you are prescribed medication for blood pressure, take your medication every day, and don't run out of medication.  Having high blood pressure can damage your heart, eyes, kidneys, and put you at risk for heart attack and stroke.  Tobacco use:  If you smoke, dip or chew, quitting will reduce your risk of heart attack, stroke, peripheral vascular disease, and many cancers.    Diabetic Report Card for Cheyenne Alvarez  October 26, 2017  Below is a summary of recent tests related to your diabetes that can help you manage your health.   Hemoglobin A1C:  Your Hemoglobin A1C values should be less than 7. If these are greater than 7, you have a higher chance of having eye, heart, and kidney problems in the future.   Your most recent Hemoglobin A1C values were:  Hgb A1c MFr Bld (%)  Date Value  10/11/2017 9.6 (H)  06/28/2017 10.4 (H)  10/04/2016 8.3 (H)     Cholesterol:  Your LDL Cholesterol (bad cholesterol) values should be less than 100 mg/dL if you do not have cardiovascular disease.  The LDL should be less than 70 mg/dL if you do have cardiovascular disease.  If your LDL is consistently higher than 100 mg/dL, then your risk of heart attack and stroke increases yearly.   Your most recent LDL Cholesterol (bad cholesterol) results were:  LDL Calculated (mg/dL)  Date Value  16/11/9602 101 (H)  06/28/2017 121 (H)   LDL Cholesterol (mg/dL)  Date Value  54/10/8117 125 (H)  07/01/2016 141 (H)  03/23/2015 81     Your HDL Cholesterol (good cholesterol) values should be higher than 40 mg/dL.  If your HDL is lower than 40 mg/dL, this increases your risk of heart attack and stroke.     Blood Pressure:  Your blood pressure values should be less than 130/80. Please contact me if your  readings at home are consistently higher than this.   Your most recent blood pressure readings at our clinic were:  BP Readings from Last 3 Encounters:  10/26/17 130/76  10/11/17 134/78  07/27/17 130/80    Urine Protein: Having an elevated microalbumin to creatinine ratio is a marker for early kidney damage due to diabetes or high blood pressure.

## 2017-10-26 NOTE — Progress Notes (Signed)
Subjective: Chief Complaint  Patient presents with  . med check    med check sugar 200 range    Here for med check on diabetes, HTN, hyperlipidemia.  I saw her about 2 weeks ago.  This is a follow-up primarily on starting new medication Soliqua in place of Tresiba.  She reports compliance with medication.  She is still seeing blood sugars in the 200s.   Walks daily for exercise.  She is compliant with metformin XR 500mg  2 tablets daily, Humalog 20 TID  No other new issues.   Past Medical History:  Diagnosis Date  . Alopecia   . Anemia   . Anxiety   . Bipolar 1 disorder (HCC)   . COPD (chronic obstructive pulmonary disease) (HCC)   . Coronary artery disease   . Depression   . Diabetes mellitus 2010  . Gastritis   . GERD (gastroesophageal reflux disease)   . Headache    migraines  . Hypertension 2010  . Obesity   . Pneumonia   . PONV (postoperative nausea and vomiting)   . PTSD (post-traumatic stress disorder)   . Schizophrenia (HCC)   . Tobacco use    Current Outpatient Medications on File Prior to Visit  Medication Sig Dispense Refill  . albuterol (PROVENTIL HFA;VENTOLIN HFA) 108 (90 Base) MCG/ACT inhaler INHALE 2 PUFFS INTO THE LUNGS EVERY 6 HOURS AS NEEDED FOR WHEEZING 18 g 0  . ALPRAZolam (XANAX) 1 MG tablet Take 1 mg by mouth 3 (three) times daily. FOR ANXIETY  0  . amantadine (SYMMETREL) 100 MG capsule Take 100 mg by mouth 2 (two) times daily. FOR TREMORS  0  . amLODipine (NORVASC) 10 MG tablet TAKE 1 TABLET(10 MG) BY MOUTH DAILY 90 tablet 1  . atenolol (TENORMIN) 50 MG tablet TAKE 1 TABLET(50 MG) BY MOUTH TWICE DAILY 180 tablet 1  . AUSTEDO 6 MG TABS Take 6 mg by mouth 2 (two) times daily.  0  . benztropine (COGENTIN) 2 MG tablet Take 2 mg by mouth 2 (two) times daily. FOR TREMORS AND MOUTH MOVEMENTS  0  . busPIRone (BUSPAR) 15 MG tablet Take 15 mg by mouth 2 (two) times daily. Reported on 04/20/2015    . diphenhydrAMINE (BENADRYL) 25 mg capsule Take 50 mg by mouth at  bedtime. Reported on 04/07/2015    . FANAPT 6 MG TABS   0  . FLUoxetine (PROZAC) 40 MG capsule Take 40 mg by mouth daily.  0  . Fluticasone-Salmeterol (ADVAIR DISKUS) 250-50 MCG/DOSE AEPB Inhale 1 puff into the lungs 2 (two) times daily. 60 each 5  . fluvoxaMINE (LUVOX) 25 MG tablet Take 25 mg by mouth 2 (two) times daily.    . Insulin Glargine-Lixisenatide (SOLIQUA) 100-33 UNT-MCG/ML SOPN Inject 30 Units into the skin at bedtime. 3 mL 5  . insulin lispro (HUMALOG KWIKPEN) 100 UNIT/ML KiwkPen Inject 0.25 mLs (25 Units total) into the skin 3 (three) times daily. 30 mL 5  . Insulin Pen Needle (NOVOFINE PLUS) 32G X 4 MM MISC USE TWICE DAILY 100 each 5  . INVEGA TRINZA 819 MG/2.625ML SUSP   0  . losartan-hydrochlorothiazide (HYZAAR) 100-25 MG tablet TAKE 1 TABLET BY MOUTH DAILY 90 tablet 0  . loxapine (LOXITANE) 25 MG capsule TAKE 1 TO 2 CAPSULES BY MOUTH  1  . metFORMIN (GLUCOPHAGE XR) 500 MG 24 hr tablet Take 2 tablets (1,000 mg total) by mouth daily. 180 tablet 1  . nitroGLYCERIN (NITROSTAT) 0.4 MG SL tablet Place 0.4 mg under the  tongue every 5 (five) minutes as needed for chest pain. Reported on 07/15/2015    . pantoprazole (PROTONIX) 40 MG tablet Take 1 tablet (40 mg total) by mouth 2 (two) times daily. 90 tablet 1  . rosuvastatin (CRESTOR) 20 MG tablet Take 1 tablet (20 mg total) by mouth daily. 90 tablet 3  . VRAYLAR capsule TK 1 C PO HS  1  . zolpidem (AMBIEN) 10 MG tablet Take 1 tablet by mouth at bedtime.   0  . glucose blood test strip 1 each by Other route 3 (three) times daily. Use as instructed 100 each 5   No current facility-administered medications on file prior to visit.    ROS as in subjective     Objective: BP 130/76   Pulse 78   Temp 98 F (36.7 C) (Oral)   Resp 16   Ht 5\' 4"  (1.626 m)   Wt 198 lb (89.8 kg)   SpO2 98%   BMI 33.99 kg/m   General appearance: alert, no distress, WD/WN,    Assessment: Encounter Diagnoses  Name Primary?  . IDDM (insulin dependent  diabetes mellitus) (HCC) Yes  . Uncontrolled type 2 diabetes mellitus with hyperglycemia (HCC)   . Essential hypertension   . Hyperlipidemia, unspecified hyperlipidemia type   . High risk medication use       Plan: She seems to be compliant with changes from last visit 2 weeks ago.  Blood sugars gradually improving.  Spent a good deal of time today discussing diabetic care, standard of care, routine follow-up, preventative measures and review her medications as below.  Recommendations  Diabetes  Continue mealtime insulinHumalog 20 units 3 times daily with meals as long as pre-meal glucose is greater than 130  Continue the new Soliqua 100/33 injection, but increase to 40 u daily at bedtime.   This will replace Tresiba long acting insulin  You can increase Soliqua 2 units per week if average blood sugars fasting are >130.  For example, in 1 week if you sugars are still running over 130 fasting like they have been, increase to 42 units weekly  Continue Metformin XR 500 mg, 2 tablets daily  High cholesterol   continue Crestor 20 mg daily at bedtime  Cut back on meat intake, cut back on hotdogs  High blood pressure  Continue atenolol 50 mg twice daily  Continue amlodipine 10 mg once daily  Continue losartan HCT 100/25 mg daily

## 2017-11-01 ENCOUNTER — Other Ambulatory Visit: Payer: Self-pay | Admitting: Medical

## 2017-11-05 ENCOUNTER — Other Ambulatory Visit: Payer: Self-pay | Admitting: Medical

## 2017-11-13 DIAGNOSIS — M1990 Unspecified osteoarthritis, unspecified site: Secondary | ICD-10-CM | POA: Diagnosis not present

## 2017-11-13 DIAGNOSIS — F419 Anxiety disorder, unspecified: Secondary | ICD-10-CM | POA: Diagnosis not present

## 2017-11-13 DIAGNOSIS — K219 Gastro-esophageal reflux disease without esophagitis: Secondary | ICD-10-CM | POA: Diagnosis not present

## 2017-11-13 DIAGNOSIS — I10 Essential (primary) hypertension: Secondary | ICD-10-CM | POA: Diagnosis not present

## 2017-11-13 DIAGNOSIS — F319 Bipolar disorder, unspecified: Secondary | ICD-10-CM | POA: Diagnosis not present

## 2017-11-13 DIAGNOSIS — E119 Type 2 diabetes mellitus without complications: Secondary | ICD-10-CM | POA: Diagnosis not present

## 2017-11-13 DIAGNOSIS — R0789 Other chest pain: Secondary | ICD-10-CM | POA: Diagnosis not present

## 2017-11-13 DIAGNOSIS — E785 Hyperlipidemia, unspecified: Secondary | ICD-10-CM | POA: Diagnosis not present

## 2017-11-14 ENCOUNTER — Other Ambulatory Visit: Payer: Self-pay | Admitting: Family Medicine

## 2017-11-14 NOTE — Telephone Encounter (Signed)
[facility] is requesting to fill pt albuterol. Please advise KH °

## 2017-11-17 ENCOUNTER — Other Ambulatory Visit: Payer: Self-pay | Admitting: Medical

## 2017-11-17 NOTE — Telephone Encounter (Signed)
[facility] and [facility] this [facility]

## 2017-11-17 NOTE — Telephone Encounter (Signed)
Done

## 2017-11-17 NOTE — Telephone Encounter (Signed)
Is this ok to refill?  

## 2017-11-21 DIAGNOSIS — Z79899 Other long term (current) drug therapy: Secondary | ICD-10-CM | POA: Diagnosis not present

## 2017-11-21 DIAGNOSIS — F410 Panic disorder [episodic paroxysmal anxiety] without agoraphobia: Secondary | ICD-10-CM | POA: Diagnosis not present

## 2017-12-07 ENCOUNTER — Other Ambulatory Visit: Payer: Self-pay | Admitting: Medical

## 2017-12-10 ENCOUNTER — Other Ambulatory Visit: Payer: Self-pay | Admitting: Family Medicine

## 2017-12-11 NOTE — Telephone Encounter (Signed)
yours

## 2017-12-11 NOTE — Telephone Encounter (Signed)
[facility] is requesting to fill pt ventolin. Please advise KH 

## 2018-01-04 ENCOUNTER — Other Ambulatory Visit: Payer: Self-pay | Admitting: Medical

## 2018-01-04 NOTE — Telephone Encounter (Signed)
Is this ok to refill?  

## 2018-01-09 DIAGNOSIS — Z79899 Other long term (current) drug therapy: Secondary | ICD-10-CM | POA: Diagnosis not present

## 2018-01-09 DIAGNOSIS — F250 Schizoaffective disorder, bipolar type: Secondary | ICD-10-CM | POA: Diagnosis not present

## 2018-01-15 ENCOUNTER — Other Ambulatory Visit: Payer: Self-pay | Admitting: Medical

## 2018-01-30 ENCOUNTER — Other Ambulatory Visit: Payer: Self-pay | Admitting: Medical

## 2018-01-30 NOTE — Telephone Encounter (Signed)
Is this ok to refill?  

## 2018-02-12 DIAGNOSIS — E119 Type 2 diabetes mellitus without complications: Secondary | ICD-10-CM | POA: Diagnosis not present

## 2018-02-12 DIAGNOSIS — M1990 Unspecified osteoarthritis, unspecified site: Secondary | ICD-10-CM | POA: Diagnosis not present

## 2018-02-12 DIAGNOSIS — F419 Anxiety disorder, unspecified: Secondary | ICD-10-CM | POA: Diagnosis not present

## 2018-02-12 DIAGNOSIS — I10 Essential (primary) hypertension: Secondary | ICD-10-CM | POA: Diagnosis not present

## 2018-02-12 DIAGNOSIS — K219 Gastro-esophageal reflux disease without esophagitis: Secondary | ICD-10-CM | POA: Diagnosis not present

## 2018-02-12 DIAGNOSIS — E785 Hyperlipidemia, unspecified: Secondary | ICD-10-CM | POA: Diagnosis not present

## 2018-02-12 DIAGNOSIS — F319 Bipolar disorder, unspecified: Secondary | ICD-10-CM | POA: Diagnosis not present

## 2018-03-06 ENCOUNTER — Other Ambulatory Visit: Payer: Self-pay | Admitting: Medical

## 2018-03-06 NOTE — Telephone Encounter (Signed)
Left message on voicemail for patient to call back to schedule med check appointment.

## 2018-03-06 NOTE — Telephone Encounter (Signed)
Refill but get in for med check visit

## 2018-03-06 NOTE — Telephone Encounter (Signed)
Is this ok to refill?  

## 2018-03-13 ENCOUNTER — Ambulatory Visit (INDEPENDENT_AMBULATORY_CARE_PROVIDER_SITE_OTHER): Payer: Medicare Other | Admitting: Medical

## 2018-03-13 ENCOUNTER — Encounter: Payer: Self-pay | Admitting: Medical

## 2018-03-13 ENCOUNTER — Other Ambulatory Visit: Payer: Self-pay

## 2018-03-13 VITALS — BP 120/76 | HR 68 | Temp 98.2°F | Resp 16 | Ht 64.0 in | Wt 201.6 lb

## 2018-03-13 DIAGNOSIS — K219 Gastro-esophageal reflux disease without esophagitis: Secondary | ICD-10-CM | POA: Diagnosis not present

## 2018-03-13 DIAGNOSIS — F411 Generalized anxiety disorder: Secondary | ICD-10-CM | POA: Diagnosis not present

## 2018-03-13 DIAGNOSIS — Z789 Other specified health status: Secondary | ICD-10-CM | POA: Diagnosis not present

## 2018-03-13 DIAGNOSIS — Z87891 Personal history of nicotine dependence: Secondary | ICD-10-CM | POA: Diagnosis not present

## 2018-03-13 DIAGNOSIS — Z7185 Encounter for immunization safety counseling: Secondary | ICD-10-CM

## 2018-03-13 DIAGNOSIS — T783XXD Angioneurotic edema, subsequent encounter: Secondary | ICD-10-CM

## 2018-03-13 DIAGNOSIS — F250 Schizoaffective disorder, bipolar type: Secondary | ICD-10-CM

## 2018-03-13 DIAGNOSIS — E119 Type 2 diabetes mellitus without complications: Secondary | ICD-10-CM | POA: Diagnosis not present

## 2018-03-13 DIAGNOSIS — J449 Chronic obstructive pulmonary disease, unspecified: Secondary | ICD-10-CM

## 2018-03-13 DIAGNOSIS — F319 Bipolar disorder, unspecified: Secondary | ICD-10-CM

## 2018-03-13 DIAGNOSIS — IMO0001 Reserved for inherently not codable concepts without codable children: Secondary | ICD-10-CM

## 2018-03-13 DIAGNOSIS — Z79899 Other long term (current) drug therapy: Secondary | ICD-10-CM

## 2018-03-13 DIAGNOSIS — Z7189 Other specified counseling: Secondary | ICD-10-CM

## 2018-03-13 DIAGNOSIS — E669 Obesity, unspecified: Secondary | ICD-10-CM | POA: Diagnosis not present

## 2018-03-13 DIAGNOSIS — T464X5D Adverse effect of angiotensin-converting-enzyme inhibitors, subsequent encounter: Secondary | ICD-10-CM

## 2018-03-13 DIAGNOSIS — B373 Candidiasis of vulva and vagina: Secondary | ICD-10-CM

## 2018-03-13 DIAGNOSIS — I10 Essential (primary) hypertension: Secondary | ICD-10-CM

## 2018-03-13 DIAGNOSIS — Z659 Problem related to unspecified psychosocial circumstances: Secondary | ICD-10-CM

## 2018-03-13 DIAGNOSIS — F4310 Post-traumatic stress disorder, unspecified: Secondary | ICD-10-CM

## 2018-03-13 DIAGNOSIS — Z2821 Immunization not carried out because of patient refusal: Secondary | ICD-10-CM | POA: Diagnosis not present

## 2018-03-13 DIAGNOSIS — Z794 Long term (current) use of insulin: Secondary | ICD-10-CM

## 2018-03-13 DIAGNOSIS — R419 Unspecified symptoms and signs involving cognitive functions and awareness: Secondary | ICD-10-CM

## 2018-03-13 DIAGNOSIS — R32 Unspecified urinary incontinence: Secondary | ICD-10-CM

## 2018-03-13 DIAGNOSIS — B3731 Acute candidiasis of vulva and vagina: Secondary | ICD-10-CM

## 2018-03-13 DIAGNOSIS — E8941 Symptomatic postprocedural ovarian failure: Secondary | ICD-10-CM

## 2018-03-13 DIAGNOSIS — R251 Tremor, unspecified: Secondary | ICD-10-CM

## 2018-03-13 DIAGNOSIS — E785 Hyperlipidemia, unspecified: Secondary | ICD-10-CM

## 2018-03-13 MED ORDER — FLUCONAZOLE 150 MG PO TABS: 150.0000 mg | ORAL_TABLET | Freq: Once | ORAL | 0 refills | Status: DC

## 2018-03-13 NOTE — Progress Notes (Signed)
Subjective: Chief Complaint  Patient presents with  . DM follow up    DM follow up sugar running good    Here for diabetes med check follow-up.  Accompanied by her oldest daughter [name].  Diabetes She is taking Metformin XR 500 mg, 2 tablets daily, Humalog 30 units TID, but also notes taking Soliqua 20u daily.  Checking glucose TID, running in the 150-180s.  Walks for exercise.   Her aid that comes daily still helps her with her medications.  Last visit we d/c Tresiba.  No foot concerns, no polydipsia, no polyuria.   Hypertension She is taking losartan HCT 100/25 mg daily, Atenolol 50mg  BID, Amlodipine 10mg  daily,   Hyperlipidemia She is taking Crestor 20 mg daily  Psychiatry, Dr. Omelia BlackwaterHeaden, sees again Friday, sees him about every 2 months.  He has changed her medications since last visit.    No other particular c/o.   Had a good Christmas.  Past Medical History:  Diagnosis Date  . Alopecia   . Anemia   . Anxiety   . Bipolar 1 disorder (HCC)   . COPD (chronic obstructive pulmonary disease) (HCC)   . Coronary artery disease   . Depression   . Diabetes mellitus 2010  . Gastritis   . GERD (gastroesophageal reflux disease)   . Headache    migraines  . Hypertension 2010  . Obesity   . Pneumonia   . PONV (postoperative nausea and vomiting)   . PTSD (post-traumatic stress disorder)   . Schizophrenia (HCC)   . Tobacco use    Current Outpatient Medications on File Prior to Visit  Medication Sig Dispense Refill  . albuterol (PROVENTIL HFA;VENTOLIN HFA) 108 (90 Base) MCG/ACT inhaler INHALE 2 PUFFS INTO THE LUNGS EVERY 6 HOURS AS NEEDED FOR WHEEZING 54 g 0  . ALPRAZolam (XANAX) 1 MG tablet Take 1 mg by mouth 3 (three) times daily. FOR ANXIETY  0  . amantadine (SYMMETREL) 100 MG capsule Take 100 mg by mouth 2 (two) times daily. FOR TREMORS  0  . amLODipine (NORVASC) 10 MG tablet TAKE 1 TABLET(10 MG) BY MOUTH DAILY 90 tablet 1  . atenolol (TENORMIN) 50 MG tablet TAKE 1 TABLET(50 MG)  BY MOUTH TWICE DAILY 180 tablet 1  . AUSTEDO 6 MG TABS Take 6 mg by mouth 2 (two) times daily.  0  . benztropine (COGENTIN) 2 MG tablet Take 2 mg by mouth 2 (two) times daily. FOR TREMORS AND MOUTH MOVEMENTS  0  . busPIRone (BUSPAR) 15 MG tablet Take 15 mg by mouth 2 (two) times daily. Reported on 04/20/2015    . diphenhydrAMINE (BENADRYL) 25 mg capsule Take 50 mg by mouth at bedtime. Reported on 04/07/2015    . FANAPT 6 MG TABS   0  . FLUoxetine (PROZAC) 40 MG capsule Take 40 mg by mouth daily.  0  . Fluticasone-Salmeterol (ADVAIR DISKUS) 250-50 MCG/DOSE AEPB Inhale 1 puff into the lungs 2 (two) times daily. 60 each 5  . fluvoxaMINE (LUVOX) 25 MG tablet Take 25 mg by mouth 2 (two) times daily.    Marland Kitchen. glucose blood test strip 1 each by Other route 3 (three) times daily. Use as instructed 100 each 5  . Insulin Glargine-Lixisenatide (SOLIQUA) 100-33 UNT-MCG/ML SOPN Inject 30 Units into the skin at bedtime. 3 mL 5  . insulin lispro (HUMALOG KWIKPEN) 100 UNIT/ML KiwkPen Inject 0.25 mLs (25 Units total) into the skin 3 (three) times daily. 30 mL 5  . Insulin Pen Needle (NOVOFINE PLUS) 32G  X 4 MM MISC USE TWICE DAILY 100 each 5  . INVEGA TRINZA 819 MG/2.625ML SUSP   0  . losartan-hydrochlorothiazide (HYZAAR) 100-25 MG tablet TAKE 1 TABLET BY MOUTH DAILY 90 tablet 0  . loxapine (LOXITANE) 25 MG capsule TAKE 1 TO 2 CAPSULES BY MOUTH  1  . metFORMIN (GLUCOPHAGE-XR) 500 MG 24 hr tablet TAKE 2 TABLETS(1000 MG) BY MOUTH DAILY 180 tablet 0  . pantoprazole (PROTONIX) 40 MG tablet Take 1 tablet (40 mg total) by mouth 2 (two) times daily. 90 tablet 1  . rosuvastatin (CRESTOR) 20 MG tablet Take 1 tablet (20 mg total) by mouth daily. 90 tablet 3  . TRESIBA FLEXTOUCH 100 UNIT/ML SOPN FlexTouch Pen INJECT INTO SKIN 50 UNITS DAILY 1 mL 0  . VRAYLAR capsule TK 1 C PO HS  1  . zolpidem (AMBIEN) 10 MG tablet Take 1 tablet by mouth at bedtime.   0  . nitroGLYCERIN (NITROSTAT) 0.4 MG SL tablet Place 0.4 mg under the tongue  every 5 (five) minutes as needed for chest pain. Reported on 07/15/2015     No current facility-administered medications on file prior to visit.    ROS as in subjective   Objective: BP 120/76   Pulse 68   Temp 98.2 F (36.8 C) (Oral)   Resp 16   Ht 5\' 4"  (1.626 m)   Wt 201 lb 9.6 oz (91.4 kg)   SpO2 100%   BMI 34.60 kg/m   Wt Readings from Last 3 Encounters:  03/13/18 201 lb 9.6 oz (91.4 kg)  10/26/17 198 lb (89.8 kg)  10/11/17 200 lb 9.6 oz (91 kg)   General appearance: alert, no distress, WD/WN,  HEENT: normocephalic, sclerae anicteric, TMs pearly, nares patent, no discharge or erythema, pharynx normal Oral cavity: MMM, no lesions Neck: supple, no lymphadenopathy, no thyromegaly, no masses Heart: RRR, normal S1, S2, no murmurs Lungs: CTA bilaterally, no wheezes, rhonchi, or rales Abdomen: +bs, soft, non tender, non distended, no masses, no hepatomegaly, no splenomegaly Pulses: 2+ symmetric, upper and lower extremities, normal cap refill  Diabetic Foot Exam - Simple   Simple Foot Form Diabetic Foot exam was performed with the following findings:  Yes 03/13/2018 10:56 AM  Visual Inspection No deformities, no ulcerations, no other skin breakdown bilaterally:  Yes Sensation Testing Intact to touch and monofilament testing bilaterally:  Yes Pulse Check Posterior Tibialis and Dorsalis pulse intact bilaterally:  Yes Comments       Assessment: Encounter Diagnoses  Name Primary?  . IDDM (insulin dependent diabetes mellitus) (HCC) Yes  . Gastroesophageal reflux disease without esophagitis   . Essential hypertension   . Chronic obstructive pulmonary disease, unspecified COPD type (HCC)   . Angiotensin converting enzyme inhibitor-aggravated angioedema, subsequent encounter   . 23-polyvalent pneumococcal polysaccharide vaccine declined   . Bipolar affective disorder, remission status unspecified (HCC)   . Obesity with serious comorbidity, unspecified classification,  unspecified obesity type   . Hyperlipidemia, unspecified hyperlipidemia type   . High risk medication use   . Generalized anxiety disorder   . Former smoker   . Decreased activities of daily living (ADL)   . Cognitive safety issue   . Incontinence in female   . Influenza vaccine refused   . MENOPAUSE, SURGICAL   . Tremor   . Vaccine counseling   . Schizoaffective disorder, bipolar type (HCC)   . POST TRAUMATIC STRESS SYNDROME     Plan: It was a good meeting today, Ms. Redfern seems to be in  good spirits, her adult daughter who helps look at her came in today name [name].  It was nice to meet her and I think she can be helpful in guiding her care going forward.  She moved from [place] this past year to be closer to mom.  Labs today, continue glucose monitoring, continue exercise.  After discussing things that sound like she is eating relatively healthy right now.  She still needs to work on weight loss  She sees gynecology and is up-to-date with that visit.  I also reviewed her June 2019 cardiology notes.  She notes that we will need to send copies of labs to cardiology, Dr. Sharyn Lull  He declines flu and pneumonia vaccines today   Corretta was seen today for dm follow up.  Diagnoses and all orders for this visit:  IDDM (insulin dependent diabetes mellitus) (HCC) -     Comprehensive metabolic panel -     CBC with Differential/Platelet -     Hemoglobin A1c -     HM DIABETES EYE EXAM -     HM DIABETES FOOT EXAM -     Microalbumin / creatinine urine ratio  Gastroesophageal reflux disease without esophagitis  Essential hypertension -     Comprehensive metabolic panel  Chronic obstructive pulmonary disease, unspecified COPD type (HCC) -     Comprehensive metabolic panel -     CBC with Differential/Platelet  Angiotensin converting enzyme inhibitor-aggravated angioedema, subsequent encounter  23-polyvalent pneumococcal polysaccharide vaccine declined  Bipolar affective  disorder, remission status unspecified (HCC)  Obesity with serious comorbidity, unspecified classification, unspecified obesity type  Hyperlipidemia, unspecified hyperlipidemia type  High risk medication use -     Comprehensive metabolic panel -     CBC with Differential/Platelet  Generalized anxiety disorder  Former smoker  Decreased activities of daily living (ADL)  Cognitive safety issue  Incontinence in female  Influenza vaccine refused  MENOPAUSE, SURGICAL  Tremor  Vaccine counseling  Schizoaffective disorder, bipolar type (HCC)  POST TRAUMATIC STRESS SYNDROME  Spent > 45 minutes face to face with patient in discussion of symptoms, evaluation, plan and recommendations.

## 2018-03-13 NOTE — Progress Notes (Signed)
Triage call from pt c/o vaginal yeast sx's Rx sent per protocol.

## 2018-03-14 ENCOUNTER — Encounter: Payer: Self-pay | Admitting: Medical

## 2018-03-14 ENCOUNTER — Other Ambulatory Visit: Payer: Self-pay | Admitting: Medical

## 2018-03-14 LAB — HEMOGLOBIN A1C
ESTIMATED AVERAGE GLUCOSE: 289 mg/dL
Hgb A1c MFr Bld: 11.7 % — ABNORMAL HIGH (ref 4.8–5.6)

## 2018-03-14 LAB — MICROALBUMIN / CREATININE URINE RATIO: Creatinine, Urine: 100 mg/dL

## 2018-03-14 LAB — CBC WITH DIFFERENTIAL/PLATELET
BASOS: 0 %
Basophils Absolute: 0 10*3/uL (ref 0.0–0.2)
EOS (ABSOLUTE): 0.1 10*3/uL (ref 0.0–0.4)
EOS: 1 %
HEMOGLOBIN: 11.3 g/dL (ref 11.1–15.9)
Hematocrit: 34.9 % (ref 34.0–46.6)
IMMATURE GRANS (ABS): 0 10*3/uL (ref 0.0–0.1)
IMMATURE GRANULOCYTES: 0 %
LYMPHS ABS: 2.7 10*3/uL (ref 0.7–3.1)
Lymphs: 32 %
MCH: 27.2 pg (ref 26.6–33.0)
MCHC: 32.4 g/dL (ref 31.5–35.7)
MCV: 84 fL (ref 79–97)
MONOS ABS: 0.5 10*3/uL (ref 0.1–0.9)
Monocytes: 5 %
NEUTROS ABS: 5.3 10*3/uL (ref 1.4–7.0)
NEUTROS PCT: 62 %
Platelets: 329 10*3/uL (ref 150–450)
RBC: 4.15 x10E6/uL (ref 3.77–5.28)
RDW: 14.1 % (ref 11.7–15.4)
WBC: 8.7 10*3/uL (ref 3.4–10.8)

## 2018-03-14 LAB — COMPREHENSIVE METABOLIC PANEL
ALBUMIN: 3.7 g/dL (ref 3.5–5.5)
ALT: 14 IU/L (ref 0–32)
AST: 16 IU/L (ref 0–40)
Albumin/Globulin Ratio: 1.4 (ref 1.2–2.2)
Alkaline Phosphatase: 106 IU/L (ref 39–117)
BUN / CREAT RATIO: 11 (ref 9–23)
BUN: 10 mg/dL (ref 6–24)
Bilirubin Total: <0.2 mg/dL (ref 0.0–1.2)
CALCIUM: 8.6 mg/dL — AB (ref 8.7–10.2)
CO2: 22 mmol/L (ref 20–29)
Chloride: 97 mmol/L (ref 96–106)
Creatinine, Ser: 0.88 mg/dL (ref 0.57–1.00)
GFR calc Af Amer: 87 mL/min/{1.73_m2} (ref >59)
GFR, EST NON AFRICAN AMERICAN: 75 mL/min/{1.73_m2} (ref >59)
GLOBULIN, TOTAL: 2.7 g/dL (ref 1.5–4.5)
Glucose: 316 mg/dL — ABNORMAL HIGH (ref 65–99)
Potassium: 4.1 mmol/L (ref 3.5–5.2)
SODIUM: 134 mmol/L (ref 134–144)
Total Protein: 6.4 g/dL (ref 6.0–8.5)

## 2018-03-14 MED ORDER — FLUTICASONE-SALMETEROL 250-50 MCG/DOSE IN AEPB: 1.0000 | INHALATION_SPRAY | Freq: Two times a day (BID) | RESPIRATORY_TRACT | 5 refills | Status: DC

## 2018-03-14 MED ORDER — INSULIN GLARGINE-LIXISENATIDE 100-33 UNT-MCG/ML ~~LOC~~ SOPN: 35.0000 [IU] | PEN_INJECTOR | Freq: Every day | SUBCUTANEOUS | 5 refills | Status: DC

## 2018-03-14 MED ORDER — ALBUTEROL SULFATE HFA 108 (90 BASE) MCG/ACT IN AERS: INHALATION_SPRAY | RESPIRATORY_TRACT | 0 refills | Status: DC

## 2018-03-14 MED ORDER — INSULIN LISPRO 200 UNIT/ML ~~LOC~~ SOPN: 25.0000 [IU] | PEN_INJECTOR | Freq: Three times a day (TID) | SUBCUTANEOUS | 5 refills | Status: DC

## 2018-03-14 MED ORDER — ATENOLOL 50 MG PO TABS: ORAL_TABLET | ORAL | 1 refills | Status: DC

## 2018-03-14 MED ORDER — INSULIN DEGLUDEC 100 UNIT/ML ~~LOC~~ SOPN: PEN_INJECTOR | SUBCUTANEOUS | 0 refills | Status: DC

## 2018-03-14 MED ORDER — METFORMIN HCL ER 500 MG PO TB24: ORAL_TABLET | ORAL | 0 refills | Status: DC

## 2018-03-14 MED ORDER — INSULIN PEN NEEDLE 32G X 4 MM MISC: 11 refills | Status: DC

## 2018-03-14 MED ORDER — AMLODIPINE BESYLATE 10 MG PO TABS: ORAL_TABLET | ORAL | 1 refills | Status: DC

## 2018-03-14 MED ORDER — LOSARTAN POTASSIUM-HCTZ 100-25 MG PO TABS: 1.0000 | ORAL_TABLET | Freq: Every day | ORAL | 3 refills | Status: DC

## 2018-03-14 MED ORDER — PANTOPRAZOLE SODIUM 40 MG PO TBEC: 40.0000 mg | DELAYED_RELEASE_TABLET | Freq: Two times a day (BID) | ORAL | 1 refills | Status: DC

## 2018-03-16 DIAGNOSIS — Z79899 Other long term (current) drug therapy: Secondary | ICD-10-CM | POA: Diagnosis not present

## 2018-03-16 DIAGNOSIS — F200 Paranoid schizophrenia: Secondary | ICD-10-CM | POA: Diagnosis not present

## 2018-04-24 DIAGNOSIS — Z79899 Other long term (current) drug therapy: Secondary | ICD-10-CM | POA: Diagnosis not present

## 2018-04-24 DIAGNOSIS — F250 Schizoaffective disorder, bipolar type: Secondary | ICD-10-CM | POA: Diagnosis not present

## 2018-05-04 ENCOUNTER — Other Ambulatory Visit: Payer: Self-pay | Admitting: Medical

## 2018-05-14 DIAGNOSIS — E119 Type 2 diabetes mellitus without complications: Secondary | ICD-10-CM | POA: Diagnosis not present

## 2018-05-14 DIAGNOSIS — E785 Hyperlipidemia, unspecified: Secondary | ICD-10-CM | POA: Diagnosis not present

## 2018-05-14 DIAGNOSIS — K219 Gastro-esophageal reflux disease without esophagitis: Secondary | ICD-10-CM | POA: Diagnosis not present

## 2018-05-14 DIAGNOSIS — M1990 Unspecified osteoarthritis, unspecified site: Secondary | ICD-10-CM | POA: Diagnosis not present

## 2018-05-14 DIAGNOSIS — F319 Bipolar disorder, unspecified: Secondary | ICD-10-CM | POA: Diagnosis not present

## 2018-05-14 DIAGNOSIS — I10 Essential (primary) hypertension: Secondary | ICD-10-CM | POA: Diagnosis not present

## 2018-05-14 DIAGNOSIS — F419 Anxiety disorder, unspecified: Secondary | ICD-10-CM | POA: Diagnosis not present

## 2018-06-11 ENCOUNTER — Other Ambulatory Visit: Payer: Self-pay | Admitting: Medical

## 2018-06-16 ENCOUNTER — Other Ambulatory Visit: Payer: Self-pay | Admitting: Medical

## 2018-06-26 DIAGNOSIS — F410 Panic disorder [episodic paroxysmal anxiety] without agoraphobia: Secondary | ICD-10-CM | POA: Diagnosis not present

## 2018-07-03 ENCOUNTER — Telehealth: Payer: Self-pay | Admitting: Medical

## 2018-07-03 ENCOUNTER — Ambulatory Visit (INDEPENDENT_AMBULATORY_CARE_PROVIDER_SITE_OTHER): Payer: Medicare Other | Admitting: Medical

## 2018-07-03 ENCOUNTER — Encounter: Payer: Self-pay | Admitting: Medical

## 2018-07-03 ENCOUNTER — Other Ambulatory Visit: Payer: Self-pay

## 2018-07-03 VITALS — BP 130/80 | HR 80 | Temp 98.0°F | Resp 16 | Ht 66.0 in | Wt 213.8 lb

## 2018-07-03 DIAGNOSIS — Z789 Other specified health status: Secondary | ICD-10-CM

## 2018-07-03 DIAGNOSIS — F411 Generalized anxiety disorder: Secondary | ICD-10-CM

## 2018-07-03 DIAGNOSIS — Z794 Long term (current) use of insulin: Secondary | ICD-10-CM

## 2018-07-03 DIAGNOSIS — Z0000 Encounter for general adult medical examination without abnormal findings: Secondary | ICD-10-CM

## 2018-07-03 DIAGNOSIS — Z79899 Other long term (current) drug therapy: Secondary | ICD-10-CM

## 2018-07-03 DIAGNOSIS — K219 Gastro-esophageal reflux disease without esophagitis: Secondary | ICD-10-CM

## 2018-07-03 DIAGNOSIS — Z659 Problem related to unspecified psychosocial circumstances: Secondary | ICD-10-CM

## 2018-07-03 DIAGNOSIS — E785 Hyperlipidemia, unspecified: Secondary | ICD-10-CM | POA: Diagnosis not present

## 2018-07-03 DIAGNOSIS — R32 Unspecified urinary incontinence: Secondary | ICD-10-CM | POA: Diagnosis not present

## 2018-07-03 DIAGNOSIS — I10 Essential (primary) hypertension: Secondary | ICD-10-CM

## 2018-07-03 DIAGNOSIS — Z2821 Immunization not carried out because of patient refusal: Secondary | ICD-10-CM

## 2018-07-03 DIAGNOSIS — E119 Type 2 diabetes mellitus without complications: Secondary | ICD-10-CM

## 2018-07-03 DIAGNOSIS — Z1239 Encounter for other screening for malignant neoplasm of breast: Secondary | ICD-10-CM

## 2018-07-03 DIAGNOSIS — J449 Chronic obstructive pulmonary disease, unspecified: Secondary | ICD-10-CM | POA: Diagnosis not present

## 2018-07-03 DIAGNOSIS — R419 Unspecified symptoms and signs involving cognitive functions and awareness: Secondary | ICD-10-CM

## 2018-07-03 DIAGNOSIS — F250 Schizoaffective disorder, bipolar type: Secondary | ICD-10-CM | POA: Diagnosis not present

## 2018-07-03 DIAGNOSIS — F319 Bipolar disorder, unspecified: Secondary | ICD-10-CM

## 2018-07-03 DIAGNOSIS — IMO0001 Reserved for inherently not codable concepts without codable children: Secondary | ICD-10-CM

## 2018-07-03 LAB — COMPREHENSIVE METABOLIC PANEL
ALT: 13 IU/L (ref 0–32)
AST: 12 IU/L (ref 0–40)
Albumin/Globulin Ratio: 1.2 (ref 1.2–2.2)
Albumin: 3.9 g/dL (ref 3.8–4.9)
Alkaline Phosphatase: 134 IU/L — ABNORMAL HIGH (ref 39–117)
BUN/Creatinine Ratio: 10 (ref 9–23)
BUN: 9 mg/dL (ref 6–24)
Bilirubin Total: <0.2 mg/dL (ref 0.0–1.2)
CO2: 19 mmol/L — ABNORMAL LOW (ref 20–29)
Calcium: 9.3 mg/dL (ref 8.7–10.2)
Chloride: 98 mmol/L (ref 96–106)
Creatinine, Ser: 0.87 mg/dL (ref 0.57–1.00)
GFR calc Af Amer: 88 mL/min/{1.73_m2} (ref >59)
GFR calc non Af Amer: 76 mL/min/{1.73_m2} (ref >59)
Globulin, Total: 3.3 g/dL (ref 1.5–4.5)
Glucose: 371 mg/dL — ABNORMAL HIGH (ref 65–99)
Potassium: 4.4 mmol/L (ref 3.5–5.2)
Sodium: 133 mmol/L — ABNORMAL LOW (ref 134–144)
Total Protein: 7.2 g/dL (ref 6.0–8.5)

## 2018-07-03 LAB — HEMOGLOBIN A1C
Est. average glucose Bld gHb Est-mCnc: 326 mg/dL
Hgb A1c MFr Bld: 13 % — ABNORMAL HIGH (ref 4.8–5.6)

## 2018-07-03 NOTE — Telephone Encounter (Signed)
Please call a few of the psychiatrist on the list to see if they would take her as a new patient.  She has significant mental health problems, was seeing Dr. Omelia Blackwater.  However, he can't prescribe some of her medications anymore as he voluntary surrendered his license and she is having some big issues with agitation and anxiety begin off some of her medications.   Dr. Len Blalock [address], [place], [place] [address] [phone]   Dr. Milagros Evener, psychiatry [address], [place], [place] [address] [phone]   [facility] [address] [place], [place], [place] [address] [phone]

## 2018-07-03 NOTE — Progress Notes (Addendum)
Subjective:    Cheyenne Alvarez is a 53 y.o. female who presents for Preventative Services visit and chronic medical problems/med check visit.    Primary Care Provider Delle Andrzejewski, Kermit Balo, PA-C here for primary care  Current Health Care Team:  Dentist, Dr. none            Eye doctor, Dr.  [facility]  Cardiology Dr. Olen Cordial     Mental Dr. Omelia Blackwater   Medical Services you may have received from other than [facility] providers in the past year (date may be approximate) psychiatry  Exercise Current exercise habits: walking daily   Nutrition/Diet Current diet: regular diet  Depression Screen Depression screen [facility] 2/9 07/03/2018  Decreased Interest 3  Down, Depressed, Hopeless 3  PHQ - 2 Score 6  Altered sleeping 3  Tired, decreased energy 3  Change in appetite 3  Feeling bad or failure about yourself  1  Trouble concentrating 3  Moving slowly or fidgety/restless 0  Suicidal thoughts 0  PHQ-9 Score 19  Difficult doing work/chores -    Activities of Daily Living Screen/Functional Status Survey Is the patient deaf or have difficulty hearing?: No Does the patient have difficulty seeing, even when wearing glasses/contacts?: No Does the patient have difficulty concentrating, remembering, or making decisions?: Yes Does the patient have difficulty walking or climbing stairs?: No Does the patient have difficulty dressing or bathing?: Yes Does the patient have difficulty doing errands alone such as visiting a doctor's office or shopping?: Yes   Fall Risk Screen Fall Risk  07/03/2018 07/01/2016 07/01/2016 11/26/2014 11/26/2014  Falls in the past year? 0 No No No Yes  Number falls in past yr: - - - - 2 or more  Injury with Fall? - - - - No  Risk Factor Category  - - - - -  Risk for fall due to : - - - - Impaired mobility  Follow up Falls evaluation completed - - - Falls prevention discussed    Gait Assessment: Normal gait observed yes  Advanced directives Does patient have a Health  Care Power of Attorney? yes Does patient have a Living Will? No   Past Medical History:  Diagnosis Date  . Alopecia   . Anemia   . Anxiety   . Bipolar 1 disorder (HCC)   . COPD (chronic obstructive pulmonary disease) (HCC)   . Coronary artery disease   . Depression   . Diabetes mellitus 2010  . Gastritis   . GERD (gastroesophageal reflux disease)   . Headache    migraines  . Hypertension 2010  . Obesity   . Pneumonia   . PONV (postoperative nausea and vomiting)   . PTSD (post-traumatic stress disorder)   . Schizophrenia (HCC)   . Tobacco use     Past Surgical History:  Procedure Laterality Date  . ABDOMINAL HYSTERECTOMY    . CARDIAC CATHETERIZATION    . CHOLECYSTECTOMY    . COLONOSCOPY  05/14/14   hemorrhoids, otherwise normal; Dr. Charlott Rakes  . COLONOSCOPY WITH PROPOFOL N/A 05/14/2014   Procedure: COLONOSCOPY WITH PROPOFOL;  Surgeon: Charlott Rakes, MD;  Location: [facility] ENDOSCOPY;  Service: Endoscopy;  Laterality: N/A;  . ESOPHAGOGASTRODUODENOSCOPY (EGD) WITH PROPOFOL N/A 05/14/2014   Procedure: ESOPHAGOGASTRODUODENOSCOPY (EGD) WITH PROPOFOL;  Surgeon: Charlott Rakes, MD;  Location: [facility] ENDOSCOPY;  Service: Endoscopy;  Laterality: N/A;  . FRACTURE SURGERY     left leg  . LEG SURGERY    . TUBAL LIGATION      Social History  Socioeconomic History  . Marital status: Divorced    Spouse name: Not on file  . Number of children: Not on file  . Years of education: Not on file  . Highest education level: Not on file  Occupational History  . Not on file  Social Needs  . Financial resource strain: Not on file  . Food insecurity:    Worry: Not on file    Inability: Not on file  . Transportation needs:    Medical: Not on file    Non-medical: Not on file  Tobacco Use  . Smoking status: Former Smoker    Last attempt to quit: 12/18/2013    Years since quitting: 4.5  . Smokeless tobacco: Never Used  Substance and Sexual Activity  . Alcohol use: No     Alcohol/week: 0.0 standard drinks  . Drug use: No  . Sexual activity: Not Currently    Partners: Male    Birth control/protection: Surgical  Lifestyle  . Physical activity:    Days per week: Not on file    Minutes per session: Not on file  . Stress: Not on file  Relationships  . Social connections:    Talks on phone: Not on file    Gets together: Not on file    Attends religious service: Not on file    Active member of club or organization: Not on file    Attends meetings of clubs or organizations: Not on file    Relationship status: Not on file  . Intimate partner violence:    Fear of current or ex partner: Not on file    Emotionally abused: Not on file    Physically abused: Not on file    Forced sexual activity: Not on file  Other Topics Concern  . Not on file  Social History Narrative  . Not on file    Family History  Problem Relation Age of Onset  . Diabetes Mother   . Kidney failure Mother   . Diabetes Other   . Cancer Other   . Hypertension Other   . Breast cancer Sister 56  . Breast cancer Maternal Grandmother      Current Outpatient Medications:  .  albuterol (VENTOLIN HFA) 108 (90 Base) MCG/ACT inhaler, INHALE 2 PUFFS INTO THE LUNGS EVERY 6 HOURS AS NEEDED FOR WHEEZING, Disp: 54 g, Rfl: 0 .  amantadine (SYMMETREL) 100 MG capsule, Take 100 mg by mouth 2 (two) times daily. FOR TREMORS, Disp: , Rfl: 0 .  amLODipine (NORVASC) 10 MG tablet, TAKE 1 TABLET(10 MG) BY MOUTH DAILY, Disp: 90 tablet, Rfl: 1 .  atenolol (TENORMIN) 50 MG tablet, Take 1 tablet (50 mg total) by mouth 2 (two) times daily., Disp: 180 tablet, Rfl: 3 .  AUSTEDO 6 MG TABS, Take 6 mg by mouth 2 (two) times daily., Disp: , Rfl: 0 .  benztropine (COGENTIN) 2 MG tablet, Take 2 mg by mouth 2 (two) times daily. FOR TREMORS AND MOUTH MOVEMENTS, Disp: , Rfl: 0 .  busPIRone (BUSPAR) 15 MG tablet, Take 15 mg by mouth 2 (two) times daily. Reported on 04/20/2015, Disp: , Rfl:  .  diphenhydrAMINE (BENADRYL) 25  mg capsule, Take 50 mg by mouth at bedtime. Reported on 04/07/2015, Disp: , Rfl:  .  FANAPT 6 MG TABS, , Disp: , Rfl: 0 .  FLUoxetine (PROZAC) 40 MG capsule, Take 40 mg by mouth daily., Disp: , Rfl: 0 .  Fluticasone-Salmeterol (ADVAIR DISKUS) 250-50 MCG/DOSE AEPB, Inhale 1 puff into the lungs  2 (two) times daily., Disp: 60 each, Rfl: 5 .  fluvoxaMINE (LUVOX) 25 MG tablet, Take 25 mg by mouth 2 (two) times daily., Disp: , Rfl:  .  glucose blood test strip, 1 each by Other route 3 (three) times daily. Use as instructed, Disp: 100 each, Rfl: 5 .  Insulin Glargine-Lixisenatide (SOLIQUA) 100-33 UNT-MCG/ML SOPN, Inject 40 Units into the skin at bedtime., Disp: 9 mL, Rfl: 1 .  Insulin Lispro (HUMALOG KWIKPEN) 200 UNIT/ML SOPN, Inject 18 Units into the skin 3 (three) times daily with meals., Disp: 9 mL, Rfl: 2 .  Insulin Pen Needle (NOVOFINE PLUS) 32G X 4 MM MISC, USE TWICE DAILY, Disp: 100 each, Rfl: 11 .  INVEGA TRINZA 819 MG/2.625ML SUSP, , Disp: , Rfl: 0 .  losartan-hydrochlorothiazide (HYZAAR) 100-25 MG tablet, Take 1 tablet by mouth daily., Disp: 90 tablet, Rfl: 3 .  loxapine (LOXITANE) 25 MG capsule, TAKE 1 TO 2 CAPSULES BY MOUTH, Disp: , Rfl: 1 .  metFORMIN (GLUCOPHAGE-XR) 500 MG 24 hr tablet, Take 2 tablets (1,000 mg total) by mouth daily with breakfast., Disp: 180 tablet, Rfl: 0 .  nitroGLYCERIN (NITROSTAT) 0.4 MG SL tablet, Place 0.4 mg under the tongue every 5 (five) minutes as needed for chest pain. Reported on 07/15/2015, Disp: , Rfl:  .  pantoprazole (PROTONIX) 40 MG tablet, TAKE 1 TABLET BY MOUTH TWICE DAILY, Disp: 180 tablet, Rfl: 1 .  rosuvastatin (CRESTOR) 20 MG tablet, Take 1 tablet (20 mg total) by mouth daily., Disp: 90 tablet, Rfl: 3 .  VRAYLAR capsule, TK 1 C PO HS, Disp: , Rfl: 1 .  ALPRAZolam (XANAX) 1 MG tablet, Take 1 mg by mouth 3 (three) times daily. FOR ANXIETY, Disp: , Rfl: 0 .  zolpidem (AMBIEN) 10 MG tablet, Take 1 tablet by mouth at bedtime. , Disp: , Rfl: 0  Allergies   Allergen Reactions  . Dexilant [Dexlansoprazole] Shortness Of Breath, Diarrhea, Nausea And Vomiting and Other (See Comments)    Chest pain and abdominal pain (also)  . Famotidine Anaphylaxis  . Meperidine Hcl Anaphylaxis  . Dilaudid [Hydromorphone Hcl] Other (See Comments)    Change in mental state  . Gabapentin Other (See Comments)    Memory loss  . Hydrocodone Nausea And Vomiting  . Ramipril Swelling and Other (See Comments)    Angioedema   . Ziprasidone Hcl Other (See Comments)    Caused convulsions  . Invokana [Canagliflozin] Rash  . Iohexol Itching and Rash  . Tape Rash    Use paper tape only     History reviewed: allergies, current medications, past family history, past medical history, past social history, past surgical history and problem list  Chronic issues discussed: Diabetes- of note, after asking her about her medicines with different questions it was not completely clear whether she was compliant or not.  She notes her blood sugars are running sometimes in the 300-400s.  She is using reportedly Soliqua 20 units daily, Metformin XR 500 mg 2 tabs daily, and it was not very clear but I think she is using Humalog 15 units each meal.  HTN - taking Amlodipine  daily, Atenolol  BID, Losartan HCT 100/25mg  daily.    Taking Crestor  daily.  Has incontinence, using adult diapers 3 daily.  Acute issues discussed: Lately has been agitated.  She sees psychiatrist Dr. Omelia Blackwater, but he lost his privileges to prescribe controlled substances.  Thus she has not been able to get Xanax and Ambien, she was taking Xanax 3 times  a day.  She has had a lot more agitation and worry in the last month plus.  At home she paces the floor.  In general she hears voices and hallucinations but this is typical for her anyhow.  She still lives alone.  Her caregiver comes into our daily.  Her daughters go and get her groceries for her.  Her daughters and her caregiver helps manage her  medications.     Objective:     Biometrics BP 130/80   Pulse 80   Temp 98 F (36.7 C) (Temporal)   Resp 16   Ht 5\' 6"  (1.676 m)   Wt 213 lb 12.8 oz (97 kg)   SpO2 98%   BMI 34.51 kg/m   Gen: seems anxious today, moving front to back in her chair, shaking her arms some, otherwise answers questions, A&O x 3 HEENT: normocephalic, sclerae anicteric, TMs pearly, nares patent, no discharge or erythema, pharynx normal Oral cavity: MMM, no lesions Neck: supple, no lymphadenopathy, no thyromegaly, no masses Heart: RRR, normal S1, S2, no murmurs Lungs: CTA bilaterally, no wheezes, rhonchi, or rales Abdomen: +bs, soft, non tender, non distended, no masses, no hepatomegaly, no splenomegaly Musculoskeletal: nontender, no swelling, no obvious deformity Extremities: no edema, no cyanosis, no clubbing Pulses: 2+ symmetric, upper and lower extremities, normal cap refill Neurological: alert, oriented x 3, CN2-12 intact, strength normal upper extremities and lower extremities, sensation normal throughout, DTRs 2+ throughout, no cerebellar signs, gait normal Psychiatric: normal affect, behavior normal, pleasant   Diabetic Foot Exam - Simple   Simple Foot Form Diabetic Foot exam was performed with the following findings:  Yes 07/04/2018  8:58 AM  Visual Inspection No deformities, no ulcerations, no other skin breakdown bilaterally:  Yes Sensation Testing Intact to touch and monofilament testing bilaterally:  Yes Pulse Check Posterior Tibialis and Dorsalis pulse intact bilaterally:  Yes Comments      Assessment:   Encounter Diagnoses  Name Primary?  . Essential hypertension Yes  . IDDM (insulin dependent diabetes mellitus) (HCC)   . Chronic obstructive pulmonary disease, unspecified COPD type (HCC)   . Gastroesophageal reflux disease without esophagitis   . Bipolar affective disorder, remission status unspecified (HCC)   . Decreased activities of daily living (ADL)   . Cognitive  safety issue   . Generalized anxiety disorder   . High risk medication use   . Hyperlipidemia, unspecified hyperlipidemia type   . Incontinence in female   . Schizoaffective disorder, bipolar type (HCC)   . Pneumococcal vaccination declined   . Screening for breast cancer   . Medicare annual wellness visit, subsequent      Plan:   A preventative services visit was completed today.  During the course of the visit today, we discussed and counseled about appropriate screening and preventive services.  A health risk assessment was established today that included a review of current medications, allergies, social history, family history, medical and preventative health history, biometrics, and preventative screenings to identify potential safety concerns or impairments.  A personalized plan was printed today for your records and use.   Personalized health advice and education was given today to reduce health risks and promote self management and wellness.  Information regarding end of life planning was discussed today.   Cancer screening:  You are due for screening mammogram.     Please call to schedule your mammogram.   [facility]  [phone] [address], [address] [place], [place] [id number]  Pap smear cervical cancer screening:  You do not need a pap smear since you had a hysterectomy.  However, you should have a pelvic exam periodically to evaluate for abnormalities.  Please see gynecology or plan to have this exam at your convenience here.     Colon cancer screening: Your last colonoscopy was 2016. I recommend you do stool cards to check for blood.   Plan to pick up stool cards from our office for this screening.    Vaccines: You declined vaccines today We recommend a yearly flu shot We recommend the 2 shot Shingles vaccines We recommend the pneumonia vaccines to try and prevent infection with certain stains of pneumonia   Other  screenings:  I recommend a yearly ophthalmology/optometry visit for glaucoma screening and eye checkup.   Make sure the eye doctor sends [place] a copy of the exam.   I recommended a yearly dental visit for hygiene and checkup   Advanced directives: I recommend you complete a living will and healthcare power of attorney if you have not done this already.  You can hire an attorney to do this, or you can use the general forms we have here and complete these and get them notarized.  Or you can print some the advance directive forms off of the [facility] site and complete these and notarized them.  Please make sure we get a copy in your loved ones who would be involved in the decision should get a copy as well   Chronic Issues: Hypertension  Continue amlodipine 10 mg daily  Continue atenolol 50 mg daily  Continue losartan HCT 100/25 mg daily  Diabetes  Continue Soliqua combination diabetes medicine pen device but increase to 40 units daily at bedtime  Continue Humalog meal time fast acting insulin but increase to 18 units with meals  Continue Metformin unchanged  I am referring you back to a diabetes specialist to get a better handle on your diabetes.    High cholesterol  Continue rosuvastatin/Crestor 20 mg daily   COPD  Continue Advair preventative inhaler 1 inhalation twice daily  Remember to rinse your mouth out with water after use and swallow  You can use albuterol rescue inhaler 2 puffs every 4-6 hours as needed for cough shortness of breath or wheezing   GERD/reflux  Continue Protonix/pantoprazole 40 mg daily if you continue to have acid reflux  Try to avoid spicy and acidic foods as they make acid reflux worse   Bipolar disorder, anxiety  I recommend you establish with a new psychiatrist    Urinary incontinence  I recently refilled the request for adult diapers   Dianara was seen today for awv.  Diagnoses and all orders for this  visit:  Essential hypertension -     Comprehensive metabolic panel  IDDM (insulin dependent diabetes mellitus) (HCC) -     Comprehensive metabolic panel -     Hemoglobin A1c  Chronic obstructive pulmonary disease, unspecified COPD type (HCC)  Gastroesophageal reflux disease without esophagitis  Bipolar affective disorder, remission status unspecified (HCC)  Decreased activities of daily living (ADL)  Cognitive safety issue  Generalized anxiety disorder  High risk medication use  Hyperlipidemia, unspecified hyperlipidemia type  Incontinence in female  Schizoaffective disorder, bipolar type (HCC)  Pneumococcal vaccination declined  Screening for breast cancer -     MM DIGITAL SCREENING BILATERAL; Future  Medicare annual wellness visit, subsequent  Other orders -     rosuvastatin (CRESTOR) 20 MG  tablet; Take 1 tablet (20 mg total) by mouth daily. -     metFORMIN (GLUCOPHAGE-XR) 500 MG 24 hr tablet; Take 2 tablets (1,000 mg total) by mouth daily with breakfast. -     Insulin Lispro (HUMALOG KWIKPEN) 200 UNIT/ML SOPN; Inject 18 Units into the skin 3 (three) times daily with meals. -     Insulin Glargine-Lixisenatide (SOLIQUA) 100-33 UNT-MCG/ML SOPN; Inject 40 Units into the skin at bedtime. -     Fluticasone-Salmeterol (ADVAIR DISKUS) 250-50 MCG/DOSE AEPB; Inhale 1 puff into the lungs 2 (two) times daily. -     atenolol (TENORMIN) 50 MG tablet; Take 1 tablet (50 mg total) by mouth 2 (two) times daily. -     albuterol (VENTOLIN HFA) 108 (90 Base) MCG/ACT inhaler; INHALE 2 PUFFS INTO THE LUNGS EVERY 6 HOURS AS NEEDED FOR WHEEZING    Medicare Attestation A preventative services visit was completed today.  During the course of the visit the patient was educated and counseled about appropriate screening and preventive services.  A health risk assessment was established with the patient that included a review of current medications, allergies, social history, family history,  medical and preventative health history, biometrics, and preventative screenings to identify potential safety concerns or impairments.  A personalized plan was printed today for the patient's records and use.   Personalized health advice and education was given today to reduce health risks and promote self management and wellness.  Information regarding end of life planning was discussed today.  Kristian Covey, PA-C   07/04/2018

## 2018-07-04 ENCOUNTER — Telehealth: Payer: Self-pay | Admitting: Medical

## 2018-07-04 ENCOUNTER — Other Ambulatory Visit: Payer: Self-pay

## 2018-07-04 DIAGNOSIS — Z794 Long term (current) use of insulin: Principal | ICD-10-CM

## 2018-07-04 DIAGNOSIS — E119 Type 2 diabetes mellitus without complications: Principal | ICD-10-CM

## 2018-07-04 DIAGNOSIS — IMO0001 Reserved for inherently not codable concepts without codable children: Secondary | ICD-10-CM

## 2018-07-04 MED ORDER — ROSUVASTATIN CALCIUM 20 MG PO TABS: 20.0000 mg | ORAL_TABLET | Freq: Every day | ORAL | 3 refills | Status: DC

## 2018-07-04 MED ORDER — ATENOLOL 50 MG PO TABS: 50.0000 mg | ORAL_TABLET | Freq: Two times a day (BID) | ORAL | 3 refills | Status: DC

## 2018-07-04 MED ORDER — FLUTICASONE-SALMETEROL 250-50 MCG/DOSE IN AEPB: 1.0000 | INHALATION_SPRAY | Freq: Two times a day (BID) | RESPIRATORY_TRACT | 5 refills | Status: DC

## 2018-07-04 MED ORDER — ALBUTEROL SULFATE HFA 108 (90 BASE) MCG/ACT IN AERS: INHALATION_SPRAY | RESPIRATORY_TRACT | 0 refills | Status: DC

## 2018-07-04 MED ORDER — INSULIN GLARGINE-LIXISENATIDE 100-33 UNT-MCG/ML ~~LOC~~ SOPN: 40.0000 [IU] | PEN_INJECTOR | Freq: Every day | SUBCUTANEOUS | 1 refills | Status: DC

## 2018-07-04 MED ORDER — METFORMIN HCL ER 500 MG PO TB24: 1000.0000 mg | ORAL_TABLET | Freq: Every day | ORAL | 0 refills | Status: DC

## 2018-07-04 MED ORDER — INSULIN LISPRO 200 UNIT/ML ~~LOC~~ SOPN: 18.0000 [IU] | PEN_INJECTOR | Freq: Three times a day (TID) | SUBCUTANEOUS | 2 refills | Status: DC

## 2018-07-04 NOTE — Addendum Note (Signed)
Addended by: Jac Canavan on: 07/04/2018 09:16 AM   Modules accepted: Orders

## 2018-07-04 NOTE — Telephone Encounter (Signed)
Referral put into epic and proficient systems

## 2018-07-04 NOTE — Patient Instructions (Signed)
Cancer screening:  You are due for screening mammogram.     Please call to schedule your mammogram.   [facility]  [phone] [address], [address] [place], [place] [address]   Pap smear cervical cancer screening:  You do not need a pap smear since you had a hysterectomy.  However, you should have a pelvic exam periodically to evaluate for abnormalities.  Please see gynecology or plan to have this exam at your convenience here.     Colon cancer screening: Your last colonoscopy was 2016. I recommend you do stool cards to check for blood.   Plan to pick up stool cards from our office for this screening.    Vaccines: You declined vaccines today We recommend a yearly flu shot We recommend the 2 shot Shingles vaccines We recommend the pneumonia vaccines to try and prevent infection with certain stains of pneumonia   Other screenings:  I recommend a yearly ophthalmology/optometry visit for glaucoma screening and eye checkup.   Make sure the eye doctor sends [place] a copy of the exam.   I recommended a yearly dental visit for hygiene and checkup   Advanced directives: I recommend you complete a living will and healthcare power of attorney if you have not done this already.  You can hire an attorney to do this, or you can use the general forms we have here and complete these and get them notarized.  Or you can print some the advance directive forms off of the [facility] site and complete these and notarized them.  Please make sure we get a copy in your loved ones who would be involved in the decision should get a copy as well   Chronic Issues: Hypertension  Continue amlodipine 10 mg daily  Continue atenolol 50 mg daily  Continue losartan HCT 100/25 mg daily  Diabetes  Continue Soliqua combination diabetes medicine pen device but increase to 40 units daily at bedtime  Continue Humalog meal time fast acting insulin  but increase to 18 units with meals  Continue Metformin unchanged  I am referring you back to a diabetes specialist to get a better handle on your diabetes.    High cholesterol  Continue rosuvastatin/Crestor 20 mg daily   COPD  Continue Advair preventative inhaler 1 inhalation twice daily  Remember to rinse your mouth out with water after use and swallow  You can use albuterol rescue inhaler 2 puffs every 4-6 hours as needed for cough shortness of breath or wheezing   GERD/reflux  Continue Protonix/pantoprazole 40 mg daily if you continue to have acid reflux  Try to avoid spicy and acidic foods as they make acid reflux worse   Bipolar disorder, anxiety  I recommend you establish with a new psychiatrist    Urinary incontinence  I recently refilled the request for adult diapers

## 2018-07-04 NOTE — Telephone Encounter (Signed)
Please refer to endocrinology ASAP.  I recommend Dr. Lonzo Cloud.

## 2018-07-10 ENCOUNTER — Encounter: Payer: Self-pay | Admitting: Internal Medicine

## 2018-07-10 ENCOUNTER — Encounter: Payer: Medicare Other | Admitting: Internal Medicine

## 2018-07-10 ENCOUNTER — Other Ambulatory Visit: Payer: Self-pay

## 2018-07-10 NOTE — Progress Notes (Deleted)
Virtual Visit via Video Note  I connected with Cheyenne Alvarez on 07/10/18 at 11:10 AM EDT by a video enabled telemedicine application and verified that I am speaking with the correct person using two identifiers.   I discussed the limitations of evaluation and management by telemedicine and the availability of in person appointments. The patient expressed understanding and agreed to proceed.   -Location of the patient : -Location of the provider : Office -The names of all persons participating in the telemedicine service : Pt and myself        Name: Cheyenne Alvarez  Age/ Sex: 53 y.o., female   MRN/ DOB: [id number], [birth year]     PCP: Genia Del   Reason for Endocrinology Evaluation: Type 2 Diabetes Mellitus     Initial Endocrinology Clinic Visit: 03/08/2016    PATIENT IDENTIFIER: Cheyenne Alvarez is a 53 y.o. female with a past medical history of T2DM, HTN, Bipolar d/o, and CAD  The patient has followed with Endocrinology clinic since *** for consultative assistance with management of her diabetes.  DIABETIC HISTORY:  Cheyenne Alvarez was diagnosed with T2DM in 2010, she has been on Metformin since diagnosis, and started insulin therapy in 2015. Her hemoglobin A1c has ranged from 6.3 % in 2011 peaking at 13.0%  in 2020. Pt was lost to follow up from 2018 and reestablished in 06/2018.    SUBJECTIVE:   During the last visit (03/24/2016): She was continued on Metformin, and basal insulin and increased prandial insulin dose.   Today (07/10/2018): Cheyenne Alvarez  She checks her blood sugars *** times daily, preprandial to breakfast and ***. The patient has *** had hypoglycemic episodes since the last clinic visit, which typically occur *** x / - most often occuring ***. The patient is *** symptomatic with these episodes, with symptoms of {symptoms; hypoglycemia:9084048}. Otherwise, the patient {HAS/HAS NOT:522402} required any recent emergency interventions for  hypoglycemia and {HAS/HAS NOT:522402} had recent hospitalizations secondary to hyper or hypoglycemic episodes.    ROS: As per HPI and as detailed below: ROS    HOME DIABETES REGIMEN:  Soliqua 40 units QHS Humalog 18 units TID QAC Metformin 500 mg 2 tabs daily   Statin: yes ACE-I/ARB: yes     GLUCOSE LOG:     DIABETIC COMPLICATIONS: Microvascular complications:   ***  Denies:   Last Eye Exam: Completed   Macrovascular complications:    Denies: CAD, CVA, PVD normal stress test in 2016   HISTORY:  Past Medical History:  Past Medical History:  Diagnosis Date  . Alopecia   . Anemia   . Anxiety   . Bipolar 1 disorder (HCC)   . COPD (chronic obstructive pulmonary disease) (HCC)   . Coronary artery disease   . Depression   . Diabetes mellitus 2010  . Gastritis   . GERD (gastroesophageal reflux disease)   . Headache    migraines  . Hypertension 2010  . Obesity   . Pneumonia   . PONV (postoperative nausea and vomiting)   . PTSD (post-traumatic stress disorder)   . Schizophrenia (HCC)   . Tobacco use     Past Surgical History:  Past Surgical History:  Procedure Laterality Date  . ABDOMINAL HYSTERECTOMY    . CARDIAC CATHETERIZATION    . CHOLECYSTECTOMY    . COLONOSCOPY  05/14/14   hemorrhoids, otherwise normal; Dr. Charlott Rakes  . COLONOSCOPY WITH PROPOFOL N/A 05/14/2014   Procedure: COLONOSCOPY WITH PROPOFOL;  Surgeon: Charlott Rakes, MD;  Location: [facility];  Service: Endoscopy;  Laterality: N/A;  . ESOPHAGOGASTRODUODENOSCOPY (EGD) WITH PROPOFOL N/A 05/14/2014   Procedure: ESOPHAGOGASTRODUODENOSCOPY (EGD) WITH PROPOFOL;  Surgeon: Charlott Rakes, MD;  Location: [facility] ENDOSCOPY;  Service: Endoscopy;  Laterality: N/A;  . FRACTURE SURGERY     left leg  . LEG SURGERY    . TUBAL LIGATION       Social History:  reports that she quit smoking about 4 years ago. She has never used smokeless tobacco. She reports that she does not drink alcohol or use  drugs. Family History:  Family History  Problem Relation Age of Onset  . Diabetes Mother   . Kidney failure Mother   . Diabetes Other   . Cancer Other   . Hypertension Other   . Breast cancer Sister 69  . Breast cancer Maternal Grandmother       HOME MEDICATIONS: Allergies as of 07/10/2018      Reactions   Dexilant [dexlansoprazole] Shortness Of Breath, Diarrhea, Nausea And Vomiting, Other (See Comments)   Chest pain and abdominal pain (also)   Famotidine Anaphylaxis   Meperidine Hcl Anaphylaxis   Dilaudid [hydromorphone Hcl] Other (See Comments)   Change in mental state   Gabapentin Other (See Comments)   Memory loss   Hydrocodone Nausea And Vomiting   Ramipril Swelling, Other (See Comments)   Angioedema   Ziprasidone Hcl Other (See Comments)   Caused convulsions   Invokana [canagliflozin] Rash   Iohexol Itching, Rash   Tape Rash   Use paper tape only       Medication List       Accurate as of Jul 10, 2018  8:24 AM. If you have any questions, ask your nurse or doctor.        albuterol 108 (90 Base) MCG/ACT inhaler Commonly known as:  VENTOLIN HFA INHALE 2 PUFFS INTO THE LUNGS EVERY 6 HOURS AS NEEDED FOR WHEEZING   ALPRAZolam 1 MG tablet Commonly known as:  XANAX Take 1 mg by mouth 3 (three) times daily. FOR ANXIETY   amantadine 100 MG capsule Commonly known as:  SYMMETREL Take 100 mg by mouth 2 (two) times daily. FOR TREMORS   amLODipine 10 MG tablet Commonly known as:  NORVASC TAKE 1 TABLET(10 MG) BY MOUTH DAILY   atenolol 50 MG tablet Commonly known as:  TENORMIN Take 1 tablet (50 mg total) by mouth 2 (two) times daily.   Austedo 6 MG Tabs Generic drug:  Deutetrabenazine Take 6 mg by mouth 2 (two) times daily.   benztropine 2 MG tablet Commonly known as:  COGENTIN Take 2 mg by mouth 2 (two) times daily. FOR TREMORS AND MOUTH MOVEMENTS   busPIRone 15 MG tablet Commonly known as:  BUSPAR Take 15 mg by mouth 2 (two) times daily. Reported on  04/20/2015   diphenhydrAMINE 25 mg capsule Commonly known as:  BENADRYL Take 50 mg by mouth at bedtime. Reported on 04/07/2015   Fanapt 6 MG Tabs Generic drug:  Iloperidone   FLUoxetine 40 MG capsule Commonly known as:  PROZAC Take 40 mg by mouth daily.   Fluticasone-Salmeterol 250-50 MCG/DOSE Aepb Commonly known as:  Advair Diskus Inhale 1 puff into the lungs 2 (two) times daily.   fluvoxaMINE 25 MG tablet Commonly known as:  LUVOX Take 25 mg by mouth 2 (two) times daily.   glucose blood test strip 1 each by Other route 3 (three) times daily. Use as instructed   Insulin Glargine-Lixisenatide 100-33 UNT-MCG/ML Sopn Commonly known as:  Soliqua Inject 40 Units into the skin at bedtime.   Insulin Lispro 200 UNIT/ML Sopn Commonly known as:  HumaLOG KwikPen Inject 18 Units into the skin 3 (three) times daily with meals.   Insulin Pen Needle 32G X 4 MM Misc Commonly known as:  NovoFine Plus USE TWICE DAILY   Invega Trinza 819 MG/2.625ML Susp Generic drug:  Paliperidone Palmitate   losartan-hydrochlorothiazide 100-25 MG tablet Commonly known as:  HYZAAR Take 1 tablet by mouth daily.   loxapine 25 MG capsule Commonly known as:  LOXITANE TAKE 1 TO 2 CAPSULES BY MOUTH   metFORMIN 500 MG 24 hr tablet Commonly known as:  GLUCOPHAGE-XR Take 2 tablets (1,000 mg total) by mouth daily with breakfast.   nitroGLYCERIN 0.4 MG SL tablet Commonly known as:  NITROSTAT Place 0.4 mg under the tongue every 5 (five) minutes as needed for chest pain. Reported on 07/15/2015   pantoprazole 40 MG tablet Commonly known as:  PROTONIX TAKE 1 TABLET BY MOUTH TWICE DAILY   rosuvastatin 20 MG tablet Commonly known as:  Crestor Take 1 tablet (20 mg total) by mouth daily.   Vraylar capsule Generic drug:  cariprazine TK 1 C PO HS   zolpidem 10 MG tablet Commonly known as:  AMBIEN Take 1 tablet by mouth at bedtime.         DATA REVIEWED:  Lab Results  Component Value Date   HGBA1C  13.0 (H) 07/03/2018   HGBA1C 11.7 (H) 03/13/2018   HGBA1C 9.6 (H) 10/11/2017   Lab Results  Component Value Date   MICROALBUR 0.9 07/01/2016   LDLCALC 101 (H) 10/11/2017   CREATININE 0.87 07/03/2018   Lab Results  Component Value Date   MICRALBCREAT 5 07/01/2016     Lab Results  Component Value Date   CHOL 182 10/11/2017   HDL 63 10/11/2017   LDLCALC 101 (H) 10/11/2017   TRIG 90 10/11/2017   CHOLHDL 2.9 10/11/2017         ASSESSMENT / PLAN / RECOMMENDATIONS:   1) Type {NUMBERS 1 OR 2:522190} Diabetes Mellitus, ***controlled, With *** complications - Most recent A1c of *** %. Goal A1c < *** %.  ***  Plan: MEDICATIONS:  ***  EDUCATION / INSTRUCTIONS:  BG monitoring instructions: Patient is instructed to check her blood sugars *** times a day, ***.  Call [facility] clinic if: BG persistently < 70 or > 300. . I reviewed the Rule of 15 for the treatment of hypoglycemia in detail with the patient. Literature supplied.  REFERRALS:  ***.   2) Diabetic complications:   Eye: Does *** have known diabetic retinopathy.   Neuro/ Feet: Does *** have known diabetic peripheral neuropathy .   Renal: Patient does *** have known baseline CKD. She   is *** on an ACEI/ARB at present.   3) Lipids: Patient is *** on a statin.  4) Hypertension: *** at goal of < 140/90 mmHg.     I discussed the assessment and treatment plan with the patient. The patient was provided an opportunity to ask questions and all were answered. The patient agreed with the plan and demonstrated an understanding of the instructions.   The patient was advised to call back or seek an in-person evaluation if the symptoms worsen or if the condition fails to improve as anticipated.  I provided *** minutes of non-face-to-face time during this encounter.   F/U in ***    Signed electronically by: Lyndle HerrlichAbby Jaralla Teressa Mcglocklin, MD  [facility]  [facility] [address] [place], [place] [address] Phone: [phone] FAX: [phone]   CC: Jac Canavan, PA-C [address] [place] [address] Phone: [phone]  Fax: [phone]  Return to Endocrinology clinic as below: Future Appointments  Date Time Provider Department Center  07/10/2018 11:10 AM Ziona Wickens, Konrad Dolores, MD [facility] None

## 2018-07-11 ENCOUNTER — Ambulatory Visit (INDEPENDENT_AMBULATORY_CARE_PROVIDER_SITE_OTHER): Payer: Medicare Other | Admitting: Internal Medicine

## 2018-07-11 ENCOUNTER — Encounter: Payer: Self-pay | Admitting: Internal Medicine

## 2018-07-11 ENCOUNTER — Other Ambulatory Visit: Payer: Self-pay

## 2018-07-11 ENCOUNTER — Other Ambulatory Visit: Payer: Self-pay | Admitting: Medical

## 2018-07-11 VITALS — BP 108/64 | HR 87 | Temp 98.6°F | Wt 214.6 lb

## 2018-07-11 DIAGNOSIS — Z794 Long term (current) use of insulin: Secondary | ICD-10-CM | POA: Diagnosis not present

## 2018-07-11 DIAGNOSIS — E1165 Type 2 diabetes mellitus with hyperglycemia: Secondary | ICD-10-CM

## 2018-07-11 MED ORDER — INSULIN LISPRO PROT & LISPRO (75-25 MIX) 100 UNIT/ML KWIKPEN: 24.0000 [IU] | PEN_INJECTOR | Freq: Two times a day (BID) | SUBCUTANEOUS | 11 refills | Status: DC

## 2018-07-11 NOTE — Progress Notes (Signed)
Name: Cheyenne Alvarez  Age/ Sex: 53 y.o., female   MRN/ DOB: [id number], 06-28-1965     PCP: Genia Del   Reason for Endocrinology Evaluation: Type 2 Diabetes Mellitus     Initial Endocrinology Clinic Visit: 03/08/2016    PATIENT IDENTIFIER: [name] is a 53 y.o. female with a past medical history of T2DM, HTN, Bipolar d/o, Schizophrenia.   The patient has followed with Endocrinology clinic since 03/08/2016 for consultative assistance with management of her diabetes.  DIABETIC HISTORY:  [name] was diagnosed with T2DM in 2010, she has been on Metformin since diagnosis, and started insulin therapy in 2015. Her hemoglobin A1c has ranged from 6.3 % in 2011 peaking at 13.0%  in 2020. Pt was lost to follow up from 2018 and reestablished in 06/2018.    SUBJECTIVE:   During the last visit (03/24/2016): She was continued on Metformin, and basal insulin and increased prandial insulin dose.   Today (07/11/2018): [name] is here for a follow up on diabetes management after being lost to follow up for 2 years.  She checks her blood sugars 3 times daily, preprandial to breakfast and bedtime. The patient has not had hypoglycemic episodes since the last clinic visit Otherwise. the patient has not required any recent emergency interventions for hypoglycemia and has not had recent hospitalizations secondary to hyper or hypoglycemic episodes.   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 She lives alone but her 3 daughters and a son who help.   She eats 3 meals a day , she snacks but avoids sugar sweetened beverages   Pt admits to non-compliance with her medications.   ROS: As per HPI and as detailed below: Review of Systems  Eyes: Negative for blurred vision and pain.  Respiratory: Negative for cough and shortness of breath.   Cardiovascular: Negative for chest pain and palpitations.  Gastrointestinal: Negative for  diarrhea and nausea.  Genitourinary: Positive for frequency.  Endo/Heme/Allergies: Positive for polydipsia.      HOME DIABETES REGIMEN:  Soliqua 40 units QHS Humalog 18 units TID QAC Metformin 500 mg 2 tabs daily   Statin: yes ACE-I/ARB: yes     GLUCOSE LOG: did not bring     DIABETIC COMPLICATIONS: Microvascular complications:    Denies: neuropathy, retinopathy   Last Eye Exam: Completed 2019  Macrovascular complications:    Denies: CAD, CVA, PVD normal stress test in 2016   HISTORY:  Past Medical History:  Past Medical History:  Diagnosis Date  . Alopecia   . Anemia   . Anxiety   . Bipolar 1 disorder (HCC)   . COPD (chronic obstructive pulmonary disease) (HCC)   . Coronary artery disease   . Depression   . Diabetes mellitus 2010  . Gastritis   . GERD (gastroesophageal reflux disease)   . Headache    migraines  . Hypertension 2010  . Obesity   . Pneumonia   . PONV (postoperative nausea and vomiting)   . PTSD (post-traumatic stress disorder)   . Schizophrenia (HCC)   . Tobacco use    Past Surgical History:  Past Surgical History:  Procedure Laterality Date  . ABDOMINAL HYSTERECTOMY    . CARDIAC CATHETERIZATION    . CHOLECYSTECTOMY    . COLONOSCOPY  05/14/14   hemorrhoids, otherwise normal; Dr. Charlott Rakes  . COLONOSCOPY WITH PROPOFOL N/A 05/14/2014   Procedure: COLONOSCOPY WITH PROPOFOL;  Surgeon: Charlott RakesVincent Schooler, MD;  [facility];  Service: Endoscopy;  Laterality: N/A;  . ESOPHAGOGASTRODUODENOSCOPY (EGD) WITH PROPOFOL N/A 05/14/2014   Procedure: ESOPHAGOGASTRODUODENOSCOPY (EGD) WITH PROPOFOL;  Surgeon: Charlott RakesVincent Schooler, MD;  [facility];  Service: Endoscopy;  Laterality: N/A;  . FRACTURE SURGERY     left leg  . LEG SURGERY    . TUBAL LIGATION      Social History:  reports that she quit smoking about 4 years ago. She has never used smokeless tobacco. She reports that she does not drink alcohol or use drugs. Family  History:  Family History  Problem Relation Age of Onset  . Diabetes Mother   . Kidney failure Mother   . Diabetes Other   . Cancer Other   . Hypertension Other   . Breast cancer Sister [id number]  . Breast cancer Maternal Grandmother      HOME MEDICATIONS: Allergies as of 07/11/2018      Reactions   Dexilant [dexlansoprazole] Shortness Of Breath, Diarrhea, Nausea And Vomiting, Other (See Comments)   Chest pain and abdominal pain (also)   Famotidine Anaphylaxis   Meperidine Hcl Anaphylaxis   Dilaudid [hydromorphone Hcl] Other (See Comments)   Change in mental state   Gabapentin Other (See Comments)   Memory loss   Hydrocodone Nausea And Vomiting   Ramipril Swelling, Other (See Comments)   Angioedema   Ziprasidone Hcl Other (See Comments)   Caused convulsions   Invokana [canagliflozin] Rash   Iohexol Itching, Rash   Tape Rash   Use paper tape only       Medication List       Accurate as of Jul 11, 2018 12:00 PM. If you have any questions, ask your nurse or doctor.        STOP taking these medications   Insulin Glargine-Lixisenatide 100-33 UNT-MCG/ML Sopn Commonly known as:  Runner, broadcasting/film/videooliqua Stopped by:  Scarlette ShortsIbtehal J Lemuel Boodram, MD   Insulin Lispro 200 UNIT/ML Sopn Commonly known as:  HumaLOG KwikPen Stopped by:  Scarlette ShortsIbtehal J Stephany Poorman, MD     TAKE these medications   albuterol 108 (90 Base) MCG/ACT inhaler Commonly known as:  VENTOLIN HFA INHALE 2 PUFFS INTO THE LUNGS EVERY 6 HOURS AS NEEDED FOR WHEEZING   ALPRAZolam 1 MG tablet Commonly known as:  XANAX Take 1 mg by mouth 3 (three) times daily. FOR ANXIETY   amantadine 100 MG capsule Commonly known as:  SYMMETREL Take 100 mg by mouth 2 (two) times daily. FOR TREMORS   amLODipine 10 MG tablet Commonly known as:  NORVASC TAKE 1 TABLET(10 MG) BY MOUTH DAILY   atenolol 50 MG tablet Commonly known as:  TENORMIN Take 1 tablet (50 mg total) by mouth 2 (two) times daily.   Austedo 6 MG Tabs Generic drug:  Deutetrabenazine  Take 6 mg by mouth 2 (two) times daily.   benztropine 2 MG tablet Commonly known as:  COGENTIN Take 2 mg by mouth 2 (two) times daily. FOR TREMORS AND MOUTH MOVEMENTS   busPIRone 15 MG tablet Commonly known as:  BUSPAR Take 15 mg by mouth 2 (two) times daily. Reported on 04/20/2015   diphenhydrAMINE 25 mg capsule Commonly known as:  BENADRYL Take 50 mg by mouth at bedtime. Reported on 04/07/2015   Fanapt 6 MG Tabs Generic drug:  Iloperidone   FLUoxetine 40 MG capsule Commonly known as:  PROZAC Take 40 mg by mouth daily.   Fluticasone-Salmeterol 250-50 MCG/DOSE Aepb Commonly known as:  Advair Diskus Inhale 1 puff into  the lungs 2 (two) times daily.   fluvoxaMINE 25 MG tablet Commonly known as:  LUVOX Take 25 mg by mouth 2 (two) times daily.   glucose blood test strip 1 each by Other route 3 (three) times daily. Use as instructed   Insulin Lispro Prot & Lispro (75-25) 100 UNIT/ML Kwikpen Commonly known as:  HumaLOG Mix 75/25 KwikPen Inject 24 Units into the skin 2 (two) times a day. Started by:  Scarlette Shorts, MD   Insulin Pen Needle 32G X 4 MM Misc Commonly known as:  NovoFine Plus USE TWICE DAILY   Invega Trinza 819 MG/2.625ML Susp Generic drug:  Paliperidone Palmitate   losartan-hydrochlorothiazide 100-25 MG tablet Commonly known as:  HYZAAR Take 1 tablet by mouth daily.   loxapine 25 MG capsule Commonly known as:  LOXITANE TAKE 1 TO 2 CAPSULES BY MOUTH   metFORMIN 500 MG 24 hr tablet Commonly known as:  GLUCOPHAGE-XR Take 2 tablets (1,000 mg total) by mouth daily with breakfast.   nitroGLYCERIN 0.4 MG SL tablet Commonly known as:  NITROSTAT Place 0.4 mg under the tongue every 5 (five) minutes as needed for chest pain. Reported on 07/15/2015   pantoprazole 40 MG tablet Commonly known as:  PROTONIX TAKE 1 TABLET BY MOUTH TWICE DAILY   rosuvastatin 20 MG tablet Commonly known as:  Crestor Take 1 tablet (20 mg total) by mouth daily.   Vraylar  capsule Generic drug:  cariprazine TK 1 C PO HS   zolpidem 10 MG tablet Commonly known as:  AMBIEN Take 1 tablet by mouth at bedtime.       OBJECTIVE:   VITAL SIGNS: BP 108/64 (BP Location: Right Wrist, Patient Position: Sitting, Cuff Size: Normal)   Pulse 87   Temp 98.6 F (37 C) (Oral)   Wt 214 lb 9.6 oz (97.3 kg)   SpO2 98%   BMI 34.64 kg/m    PHYSICAL EXAM:  General: Pt appears well and is in NAD  HEENT: Head: Unremarkable with good dentition. Oropharynx clear without exudate.  Eyes: External eye exam normal without stare, lid lag or exophthalmos.  EOM intact.   Neck: General: Supple without adenopathy or carotid bruits. Thyroid: Thyroid size normal.  No goiter or nodules appreciated. No thyroid bruit.  Lungs: Clear with good BS bilat with no rales, rhonchi, or wheezes  Heart: RRR with normal S1 and S2 and no gallops; no murmurs; no rub  Abdomen: Normoactive bowel sounds, soft, nontender, without masses or organomegaly palpable  Extremities:  Lower extremities - No pretibial edema. No lesions.  Skin: Normal texture and temperature to palpation. No rash noted.  Neuro: MS is good with appropriate affect, pt is alert and Ox3    DM foot exam: 07/11/2018 The skin of the feet is intact without sores or ulcerations.pt with plantar callous formation.  The pedal pulses are 2+ on right and 2+ on left. The sensation is intact to a screening 5.07, 10 gram monofilament bilaterally    DATA REVIEWED:  Lab Results  Component Value Date   HGBA1C 13.0 (H) 07/03/2018   HGBA1C 11.7 (H) 03/13/2018   HGBA1C 9.6 (H) 10/11/2017   Lab Results  Component Value Date   MICROALBUR 0.9 07/01/2016   LDLCALC 101 (H) 10/11/2017   CREATININE 0.87 07/03/2018   Lab Results  Component Value Date   MICRALBCREAT 5 07/01/2016     Lab Results  Component Value Date   CHOL 182 10/11/2017   HDL 63 10/11/2017   LDLCALC 101 (H)  10/11/2017   TRIG 90 10/11/2017   CHOLHDL 2.9 10/11/2017          ASSESSMENT / PLAN / RECOMMENDATIONS:   1) Type 2 Diabetes Mellitus, Poorly controlled, Without complications - Most recent A1c of 13.0 %. Goal A1c < 7.0 %.    Plan:  - With an A1c of 13.0% , it is clear the patient is not taking her medications. Unclear if this is due to forgetfulness or lack of motivation to control her diabetes.  - I have discussed with the patient the pathophysiology of diabetes. We went over the natural progression of the disease. We talked about both insulin resistance and insulin deficiency. We stressed the importance of lifestyle changes including diet and exercise. I explained the complications associated with diabetes including retinopathy, nephropathy, neuropathy as well as increased risk of cardiovascular disease. We went over the benefit seen with glycemic control.   - I explained to the patient that diabetic patients are at higher than normal risk for amputations. The patient was informed that diabetes is the number one cause of non-traumatic amputations in [place]. - I have encouraged her to continue avoiding sugar-sweetened beverages, we also discussed avoiding snacks when possible.  - Pt does admit to forgetting to take her medications a lot, but it seems she may not have been taking any for a while. In the hopes to simplify her regimen to make it easy for her to remember is to give her an insulin mix to be taken twice a day. She was advised to have family help her setup a reminder on her phone as she normally eats breakfast around 8 am and supper at 6 pm.  - In the future we will consider increasing her metformin - Discussed the importance of glucose checks and having the availability of this data to me.  - I have explained to her that the current humalog pens that she has at home are different from the Humalog mix and she needs to either dispose of the old medicine and to consider taking back to the pharmacy.      MEDICATIONS: - STOP Soliqua - STOP  Humalog  - START Humalog MIX (75/25) 24 units with Breakfast and 24 units with Supper  - Continue Metformin 500 mg 1 tablet with Breakfast and 1 tablet with Supper   EDUCATION / INSTRUCTIONS:  BG monitoring instructions: Patient is instructed to check her blood sugars 2 times a day, breakfast and supper.  Call  Endocrinology clinic if: BG persistently < 70 or > 300. . I reviewed the Rule of 15 for the treatment of hypoglycemia in detail with the patient. Literature supplied.  REFERRALS:  CDE   2) Diabetic complications:   Eye: Does not have known diabetic retinopathy.   Neuro/ Feet: Does not have known diabetic peripheral neuropathy .   Renal: Patient does not have known baseline CKD. She   is  on an ACEI/ARB at present.   3) Lipids: Patient is on a Crestor 20 mg daily  But most likely she is not taking it . Encouraged compliance.   4) Hypertension:  She is at goal of < 140/90 mmHg.     F/U in 4 weeks    Signed electronically by: Lyndle Herrlich, MD  [facility]  [facility] [address] [place], [place] [address] Phone: [phone] FAX: [phone]   CC: Jac Canavan, PA-C [address] [place] [address] Phone: [phone]  Fax: [phone]  Return  to Endocrinology clinic as below: Future Appointments  Date Time Provider Department Center  08/08/2018  1:20 PM Shanine Kreiger, Konrad Dolores, MD [facility] None

## 2018-07-11 NOTE — Patient Instructions (Signed)
-   STOP Soliqua - STOP Humalog  - START Humalog MIX 24 units with Breakfast and 24 units with Supper  - Continue Metformin 500 mg 1 tablet with Breakfast and 1 tablet with Supper   - Set up reminders on your phone    HOW TO TREAT LOW BLOOD SUGARS (Blood sugar LESS THAN 70 MG/DL)  Please follow the RULE OF 15 for the treatment of hypoglycemia treatment (when your (blood sugars are less than 70 mg/dL)    STEP 1: Take 15 grams of carbohydrates when your blood sugar is low, which includes:   3-4 GLUCOSE TABS  OR  3-4 OZ OF JUICE OR REGULAR SODA OR  ONE TUBE OF GLUCOSE GEL     STEP 2: RECHECK blood sugar in 15 MINUTES STEP 3: If your blood sugar is still low at the 15 minute recheck --> then, go back to STEP 1 and treat AGAIN with another 15 grams of carbohydrates.

## 2018-07-14 ENCOUNTER — Telehealth: Payer: Self-pay | Admitting: Medical

## 2018-07-14 NOTE — Telephone Encounter (Signed)
Looks like Endocrinologist discontinued Humalog 200 and switched to Humalog mix 75/25.  Called pharmacy & that also requires P.A.  They are faxing info

## 2018-07-14 NOTE — Telephone Encounter (Signed)
P.A. HUMALOG KWIKPEN 200

## 2018-07-16 ENCOUNTER — Other Ambulatory Visit: Payer: Self-pay | Admitting: *Deleted

## 2018-07-16 DIAGNOSIS — B3731 Acute candidiasis of vulva and vagina: Secondary | ICD-10-CM

## 2018-07-16 DIAGNOSIS — B373 Candidiasis of vulva and vagina: Secondary | ICD-10-CM

## 2018-07-16 MED ORDER — FLUCONAZOLE 150 MG PO TABS: 150.0000 mg | ORAL_TABLET | Freq: Once | ORAL | 0 refills | Status: AC

## 2018-07-16 NOTE — Progress Notes (Signed)
Pt called to office with yeast infection, would like tx. Diflucan sent per protocol.

## 2018-07-17 ENCOUNTER — Other Ambulatory Visit: Payer: Self-pay | Admitting: Internal Medicine

## 2018-07-17 MED ORDER — INSULIN ASPART PROT & ASPART (70-30 MIX) 100 UNIT/ML PEN: 24.0000 [IU] | PEN_INJECTOR | Freq: Two times a day (BID) | SUBCUTANEOUS | 11 refills | Status: DC

## 2018-07-30 NOTE — Telephone Encounter (Signed)
Called pharmacy & pt was switched to Novolog mix 70/30 by Endocrinologist and that went thru for $0 co pay.  Called pt and informed

## 2018-08-08 ENCOUNTER — Other Ambulatory Visit: Payer: Self-pay

## 2018-08-08 ENCOUNTER — Ambulatory Visit (INDEPENDENT_AMBULATORY_CARE_PROVIDER_SITE_OTHER): Payer: Medicare Other | Admitting: Internal Medicine

## 2018-08-08 ENCOUNTER — Encounter: Payer: Self-pay | Admitting: Internal Medicine

## 2018-08-08 VITALS — BP 122/80 | HR 69 | Temp 98.3°F | Ht 66.0 in | Wt 214.0 lb

## 2018-08-08 DIAGNOSIS — E1165 Type 2 diabetes mellitus with hyperglycemia: Secondary | ICD-10-CM

## 2018-08-08 DIAGNOSIS — Z794 Long term (current) use of insulin: Secondary | ICD-10-CM | POA: Diagnosis not present

## 2018-08-08 LAB — GLUCOSE, POCT (MANUAL RESULT ENTRY): POC Glucose: 471 mg/dl — AB (ref 70–99)

## 2018-08-08 MED ORDER — INSULIN ASPART PROT & ASPART (70-30 MIX) 100 UNIT/ML PEN: 34.0000 [IU] | PEN_INJECTOR | Freq: Two times a day (BID) | SUBCUTANEOUS | 11 refills | Status: DC

## 2018-08-08 NOTE — Patient Instructions (Addendum)
-   Increase Novolog Mix to 34 units With Breakfast and 34 units with Supper  - Continue Metformin 500 mg 1 tablet with breakfast and 1 tablet with Supper    - Please bring your meter on next visit     HOW TO TREAT LOW BLOOD SUGARS (Blood sugar LESS THAN 70 MG/DL)  Please follow the RULE OF 15 for the treatment of hypoglycemia treatment (when your (blood sugars are less than 70 mg/dL)    STEP 1: Take 15 grams of carbohydrates when your blood sugar is low, which includes:   3-4 GLUCOSE TABS  OR  3-4 OZ OF JUICE OR REGULAR SODA OR  ONE TUBE OF GLUCOSE GEL     STEP 2: RECHECK blood sugar in 15 MINUTES STEP 3: If your blood sugar is still low at the 15 minute recheck --> then, go back to STEP 1 and treat AGAIN with another 15 grams of carbohydrates.

## 2018-08-08 NOTE — Progress Notes (Signed)
Name: Cheyenne Alvarez  Age/ Sex: 53 y.o., female   MRN/ DOB: [id number], 26-Dec-1965     PCP: Jac Canavanysinger, David S, PA-C   Reason for Endocrinology Evaluation: Type 2 Diabetes Mellitus  Initial Endocrine Consultative Visit: 03/08/2016    PATIENT IDENTIFIER: Cheyenne Alvarez is a 53 y.o. female with a past medical history of T2DM, HTN, Bipolar d/o, Schizophrenia . The patient has followed with Endocrinology clinic since 03/08/2016 for consultative assistance with management of her diabetes.  DIABETIC HISTORY:  Cheyenne Alvarez was diagnosed with T2DM in 2010,  she has been on Metformin since diagnosis, and started insulin therapy in 2015. Her hemoglobin A1c has ranged from 6.3 % in 2011 peaking at 13.0%  in 2020   Pt was lost to follow up from 2018 and reestablished in 06/2018.  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 She lives alone but her 3 daughters and a son who help.   Previous hx with non-compliance.   SUBJECTIVE:   During the last visit (07/11/2018): A1c 13.0%. Stopped soliqua, and humalog. Started Novolog mix 24 units BID and continued  Metformin BID .  Today (08/08/2018): Cheyenne Alvarez is here for a 4- week follow up on diabetes management.  She checks her blood sugars 3 times daily, preprandial.The patient has not had hypoglycemic episodes since the last clinic visit. Otherwise, the patient has not required any recent emergency interventions for hypoglycemia and has not had recent hospitalizations secondary to hyper or hypoglycemic episodes.   Pt admits to skipping on insulin at times.  She is not sure how many tablets of metformin she is taking, her aid fills her pill box   ROS: As per HPI and as detailed below: Review of Systems  Constitutional: Negative for fever.  HENT: Negative for congestion and sore throat.   Respiratory: Negative for cough and shortness of breath.   Cardiovascular: Negative for chest pain and  palpitations.  Gastrointestinal: Negative for diarrhea and nausea.      HOME DIABETES REGIMEN:  Novolog Mix 24 units BID  Metformin 500 mg BID    METER DOWNLOAD SUMMARY: Did not bring     HISTORY:  Past Medical History:  Past Medical History:  Diagnosis Date  . Alopecia   . Anemia   . Anxiety   . Bipolar 1 disorder (HCC)   . COPD (chronic obstructive pulmonary disease) (HCC)   . Coronary artery disease   . Depression   . Diabetes mellitus 2010  . Gastritis   . GERD (gastroesophageal reflux disease)   . Headache    migraines  . Hypertension 2010  . Obesity   . Pneumonia   . PONV (postoperative nausea and vomiting)   . PTSD (post-traumatic stress disorder)   . Schizophrenia (HCC)   . Tobacco use    Past Surgical History:  Past Surgical History:  Procedure Laterality Date  . ABDOMINAL HYSTERECTOMY    . CARDIAC CATHETERIZATION    . CHOLECYSTECTOMY    . COLONOSCOPY  05/14/14   hemorrhoids, otherwise normal; Dr. Charlott RakesVincent Schooler  . COLONOSCOPY WITH PROPOFOL N/A 05/14/2014   Procedure: COLONOSCOPY WITH PROPOFOL;  Surgeon: Charlott RakesVincent Schooler, MD;  [facility];  Service: Endoscopy;  Laterality: N/A;  . ESOPHAGOGASTRODUODENOSCOPY (EGD) WITH PROPOFOL N/A 05/14/2014   Procedure: ESOPHAGOGASTRODUODENOSCOPY (EGD) WITH PROPOFOL;  Surgeon: Charlott RakesVincent Schooler, MD;  [facility];  Service: Endoscopy;  Laterality: N/A;  . FRACTURE SURGERY  left leg  . LEG SURGERY    . TUBAL LIGATION      Social History:  reports that she quit smoking about 4 years ago. She has never used smokeless tobacco. She reports that she does not drink alcohol or use drugs. Family History:  Family History  Problem Relation Age of Onset  . Diabetes Mother   . Kidney failure Mother   . Diabetes Other   . Cancer Other   . Hypertension Other   . Breast cancer Sister 2353  . Breast cancer Maternal Grandmother      HOME MEDICATIONS: Allergies as of 08/08/2018      Reactions    Dexilant [dexlansoprazole] Shortness Of Breath, Diarrhea, Nausea And Vomiting, Other (See Comments)   Chest pain and abdominal pain (also)   Famotidine Anaphylaxis   Meperidine Hcl Anaphylaxis   Dilaudid [hydromorphone Hcl] Other (See Comments)   Change in mental state   Gabapentin Other (See Comments)   Memory loss   Hydrocodone Nausea And Vomiting   Ramipril Swelling, Other (See Comments)   Angioedema   Ziprasidone Hcl Other (See Comments)   Caused convulsions   Invokana [canagliflozin] Rash   Iohexol Itching, Rash   Tape Rash   Use paper tape only       Medication List       Accurate as of August 08, 2018  1:39 PM. If you have any questions, ask your nurse or doctor.        albuterol 108 (90 Base) MCG/ACT inhaler Commonly known as:  VENTOLIN HFA INHALE 2 PUFFS INTO THE LUNGS EVERY 6 HOURS AS NEEDED FOR WHEEZING   ALPRAZolam 1 MG tablet Commonly known as:  XANAX Take 1 mg by mouth 3 (three) times daily. FOR ANXIETY   amantadine 100 MG capsule Commonly known as:  SYMMETREL Take 100 mg by mouth 2 (two) times daily. FOR TREMORS   amLODipine 10 MG tablet Commonly known as:  NORVASC TAKE 1 TABLET(10 MG) BY MOUTH DAILY   atenolol 50 MG tablet Commonly known as:  TENORMIN Take 1 tablet (50 mg total) by mouth 2 (two) times daily.   Austedo 6 MG Tabs Generic drug:  Deutetrabenazine Take 6 mg by mouth 2 (two) times daily.   benztropine 2 MG tablet Commonly known as:  COGENTIN Take 2 mg by mouth 2 (two) times daily. FOR TREMORS AND MOUTH MOVEMENTS   busPIRone 15 MG tablet Commonly known as:  BUSPAR Take 15 mg by mouth 2 (two) times daily. Reported on 04/20/2015   diphenhydrAMINE 25 mg capsule Commonly known as:  BENADRYL Take 50 mg by mouth at bedtime. Reported on 04/07/2015   Fanapt 6 MG Tabs Generic drug:  Iloperidone   FLUoxetine 40 MG capsule Commonly known as:  PROZAC Take 40 mg by mouth daily.   Fluticasone-Salmeterol 250-50 MCG/DOSE Aepb Commonly known  as:  Advair Diskus Inhale 1 puff into the lungs 2 (two) times daily.   fluvoxaMINE 25 MG tablet Commonly known as:  LUVOX Take 25 mg by mouth 2 (two) times daily.   glucose blood test strip 1 each by Other route 3 (three) times daily. Use as instructed   insulin aspart protamine - aspart (70-30) 100 UNIT/ML FlexPen Commonly known as:  NovoLOG Mix 70/30 FlexPen Inject 0.24 mLs (24 Units total) into the skin 2 (two) times daily.   Insulin Pen Needle 32G X 4 MM Misc Commonly known as:  NovoFine Plus USE TWICE DAILY   Invega Trinza 819 MG/2.625ML Susp Generic  drug:  Paliperidone Palmitate   losartan-hydrochlorothiazide 100-25 MG tablet Commonly known as:  HYZAAR Take 1 tablet by mouth daily.   loxapine 25 MG capsule Commonly known as:  LOXITANE TAKE 1 TO 2 CAPSULES BY MOUTH   metFORMIN 500 MG 24 hr tablet Commonly known as:  GLUCOPHAGE-XR Take 2 tablets (1,000 mg total) by mouth daily with breakfast.   nitroGLYCERIN 0.4 MG SL tablet Commonly known as:  NITROSTAT Place 0.4 mg under the tongue every 5 (five) minutes as needed for chest pain. Reported on 07/15/2015   pantoprazole 40 MG tablet Commonly known as:  PROTONIX TAKE 1 TABLET BY MOUTH TWICE DAILY   rosuvastatin 20 MG tablet Commonly known as:  Crestor Take 1 tablet (20 mg total) by mouth daily.   Vraylar capsule Generic drug:  cariprazine TK 1 C PO HS   zolpidem 10 MG tablet Commonly known as:  AMBIEN Take 1 tablet by mouth at bedtime.        OBJECTIVE:   Vital Signs: BP 122/80 (BP Location: Left Arm, Patient Position: Sitting, Cuff Size: Large)   Pulse 69   Temp 98.3 F (36.8 C)   Ht 5\' 6"  (1.676 m)   Wt 214 lb (97.1 kg)   SpO2 98%   BMI 34.54 kg/m   Wt Readings from Last 3 Encounters:  08/08/18 214 lb (97.1 kg)  07/11/18 214 lb 9.6 oz (97.3 kg)  07/03/18 213 lb 12.8 oz (97 kg)     Exam: General: Pt appears well and is in NAD  Neck: General: Supple without adenopathy. Thyroid: Thyroid  size normal.  No goiter or nodules appreciated. No thyroid bruit.  Lungs: Clear with good BS bilat with no rales, rhonchi, or wheezes  Heart: RRR with normal S1 and S2 and no gallops; no murmurs; no rub  Abdomen: Normoactive bowel sounds, soft, nontender, without masses or organomegaly palpable  Extremities: No pretibial edema. No tremor.  Skin: Normal texture and temperature to palpation. No rash noted. No Acanthosis nigricans/skin tags. No lipohypertrophy.  Neuro: MS is good with appropriate affect, pt is alert and Ox3    DM foot exam: 07/11/2018 The skin of the feet is intact without sores or ulcerations.pt with plantar callous formation.  The pedal pulses are 2+ on right and 2+ on left. The sensation is intact to a screening 5.07, 10 gram monofilament bilaterally     DATA REVIEWED:  Lab Results  Component Value Date   HGBA1C 13.0 (H) 07/03/2018   HGBA1C 11.7 (H) 03/13/2018   HGBA1C 9.6 (H) 10/11/2017   Lab Results  Component Value Date   MICROALBUR 0.9 07/01/2016   LDLCALC 101 (H) 10/11/2017   CREATININE 0.87 07/03/2018   Lab Results  Component Value Date   MICRALBCREAT 5 07/01/2016     Lab Results  Component Value Date   CHOL 182 10/11/2017   HDL 63 10/11/2017   LDLCALC 101 (H) 10/11/2017   TRIG 90 10/11/2017   CHOLHDL 2.9 10/11/2017       In-office BG was 471 mg/dL.    ASSESSMENT / PLAN / RECOMMENDATIONS:   1) Type 2 Diabetes Mellitus, Poorly controlled, Without complications - Most recent A1c of 13.0 %. Goal A1c < 7.0 %.   Plan: - Patient admits to continued medication non-adherence at times, she couldn't tell me how many times a week she would forget to take her insulin. She is not sure how many tablets a day of metformin she takes a day, as her aid fills her  pill box.  - In the office her Bg was 471 mg/dL despite taking 24 units with Breakfast, pt had cereal and drank juice. I have advised her again to avoid sugar-sweetened beverages. We will increase  her insulin regimen as below  - I explained to her the importance of checking glucose and the availability of this data to me - Pt encouraged to contact our office if Bg's remain above 200 mg/dL.  - She had declined a CDE referral   MEDICATIONS:  Increase Novolog Mix to 34 units BID with meals (breakfast and Supper)  Continue Metformin 500 mg XR BID   EDUCATION / INSTRUCTIONS:  BG monitoring instructions: Patient is instructed to check her blood sugars 2 times a day, breakfast and supper.  Call [place] [facility] if: BG persistently < 70 or > 300. . I reviewed the Rule of 15 for the treatment of hypoglycemia in detail with the patient. Literature supplied.     F/U in 6 weeks    Signed electronically by: Lyndle HerrlichAbby Jaralla Shamleffer, MD  [facility]  [facility] [address] [place], [place] [address] Phone: [phone] FAX: [phone]   CC: Jac Canavanysinger, David S, PA-C [address] [place] [address] Phone: [phone]  Fax: [phone]  Return to Endocrinology clinic as below: Future Appointments  Date Time Provider Department Center  09/19/2018  1:00 PM Shamleffer, Konrad DoloresIbtehal Jaralla, MD [facility] None

## 2018-08-15 DIAGNOSIS — F250 Schizoaffective disorder, bipolar type: Secondary | ICD-10-CM | POA: Diagnosis not present

## 2018-08-22 ENCOUNTER — Other Ambulatory Visit: Payer: Self-pay | Admitting: Medical

## 2018-08-22 NOTE — Telephone Encounter (Signed)
Is this ok to refill?   It looks like she goes to endocrinology

## 2018-08-22 NOTE — Telephone Encounter (Signed)
Resend request to her endocrionlogist

## 2018-08-24 DIAGNOSIS — I10 Essential (primary) hypertension: Secondary | ICD-10-CM | POA: Diagnosis not present

## 2018-08-24 DIAGNOSIS — E785 Hyperlipidemia, unspecified: Secondary | ICD-10-CM | POA: Diagnosis not present

## 2018-08-24 DIAGNOSIS — F319 Bipolar disorder, unspecified: Secondary | ICD-10-CM | POA: Diagnosis not present

## 2018-08-24 DIAGNOSIS — F419 Anxiety disorder, unspecified: Secondary | ICD-10-CM | POA: Diagnosis not present

## 2018-08-24 DIAGNOSIS — E119 Type 2 diabetes mellitus without complications: Secondary | ICD-10-CM | POA: Diagnosis not present

## 2018-08-24 DIAGNOSIS — M1990 Unspecified osteoarthritis, unspecified site: Secondary | ICD-10-CM | POA: Diagnosis not present

## 2018-09-13 ENCOUNTER — Other Ambulatory Visit: Payer: Self-pay | Admitting: Medical

## 2018-09-17 ENCOUNTER — Other Ambulatory Visit: Payer: Self-pay

## 2018-09-19 ENCOUNTER — Encounter: Payer: Self-pay | Admitting: Internal Medicine

## 2018-09-19 ENCOUNTER — Other Ambulatory Visit: Payer: Self-pay

## 2018-09-19 ENCOUNTER — Ambulatory Visit (INDEPENDENT_AMBULATORY_CARE_PROVIDER_SITE_OTHER): Payer: Medicare Other | Admitting: Internal Medicine

## 2018-09-19 VITALS — BP 128/88 | HR 72 | Temp 98.6°F | Ht 66.0 in | Wt 225.0 lb

## 2018-09-19 DIAGNOSIS — E1165 Type 2 diabetes mellitus with hyperglycemia: Secondary | ICD-10-CM

## 2018-09-19 DIAGNOSIS — Z794 Long term (current) use of insulin: Secondary | ICD-10-CM

## 2018-09-19 LAB — GLUCOSE, POCT (MANUAL RESULT ENTRY): POC Glucose: 333 mg/dl — AB (ref 70–99)

## 2018-09-19 LAB — POCT GLYCOSYLATED HEMOGLOBIN (HGB A1C): Hemoglobin A1C: 12.6 % — AB (ref 4.0–5.6)

## 2018-09-19 MED ORDER — METFORMIN HCL ER 500 MG PO TB24: ORAL_TABLET | ORAL | 1 refills | Status: DC

## 2018-09-19 MED ORDER — NOVOLOG MIX 70/30 FLEXPEN (70-30) 100 UNIT/ML ~~LOC~~ SUPN: 44.0000 [IU] | PEN_INJECTOR | Freq: Two times a day (BID) | SUBCUTANEOUS | 11 refills | Status: DC

## 2018-09-19 NOTE — Patient Instructions (Signed)
-   Increase Novolog Mix to 44 units With Breakfast and 44 units with Supper  - Take Metformin 2 tablets with breakfast and 1 tablet with Supper    - Please bring your meter on next visit     HOW TO TREAT LOW BLOOD SUGARS (Blood sugar LESS THAN 70 MG/DL)  Please follow the RULE OF 15 for the treatment of hypoglycemia treatment (when your (blood sugars are less than 70 mg/dL)    STEP 1: Take 15 grams of carbohydrates when your blood sugar is low, which includes:   3-4 GLUCOSE TABS  OR  3-4 OZ OF JUICE OR REGULAR SODA OR  ONE TUBE OF GLUCOSE GEL     STEP 2: RECHECK blood sugar in 15 MINUTES STEP 3: If your blood sugar is still low at the 15 minute recheck --> then, go back to STEP 1 and treat AGAIN with another 15 grams of carbohydrates.

## 2018-09-19 NOTE — Progress Notes (Signed)
Name: Cheyenne Alvarez  Age/ Sex: 53 y.o., female   MRN/ DOB: [id number], 02-Mar-1965     PCP: Cheyenne Hurl, PA-C   Reason for Endocrinology Evaluation: Type 2 Diabetes Mellitus  Initial Endocrine Consultative Visit: 03/08/2016    PATIENT IDENTIFIER: [name] is a 53 y.o. female with a past medical history of T2DM, HTN, Bipolar d/o, Schizophrenia . The patient has followed with Endocrinology clinic since 03/08/2016 for consultative assistance with management of her diabetes.  DIABETIC HISTORY:  [name] was diagnosed with T2DM in 2010,  she has been on Metformin since diagnosis, and started insulin therapy in 2015. Her hemoglobin A1c has ranged from 6.3 % in 2011 peaking at 13.0%  in 2020   Pt was lost to follow up from 2018 and reestablished in 06/2018.  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 She lives alone but her 3 daughters and a son who help.   Previous hx with non-compliance.   SUBJECTIVE:   During the last visit (08/08/2018): We increased Novolog Mix and continued Metformin    Today (09/19/2018): [name] is here for a 6 week follow up on diabetes management.  She checks her blood sugars 2 times daily, preprandial.The patient has not had hypoglycemic episodes since the last clinic visit. Otherwise, the patient has not required any recent emergency interventions for hypoglycemia and has not had recent hospitalizations secondary to hyper or hypoglycemic episodes.   She continues to forget to take her insulin at times. But she denies any snacking between meals and avoids sugar-sweetened beverages.       ROS: As per HPI and as detailed below: Review of Systems  Constitutional: Negative for fever.  HENT: Negative for congestion and sore throat.   Respiratory: Negative for cough and shortness of breath.   Cardiovascular: Negative for chest pain and palpitations.  Gastrointestinal: Negative for  diarrhea and nausea.      HOME DIABETES REGIMEN:  Novolog Mix 24 units BID  Metformin 500 mg BID    METER DOWNLOAD SUMMARY: Did not bring     HISTORY:  Past Medical History:  Past Medical History:  Diagnosis Date  . Alopecia   . Anemia   . Anxiety   . Bipolar 1 disorder ([place])   . COPD (chronic obstructive pulmonary disease) ([place])   . Coronary artery disease   . Depression   . Diabetes mellitus 2010  . Gastritis   . GERD (gastroesophageal reflux disease)   . Headache    migraines  . Hypertension 2010  . Obesity   . Pneumonia   . PONV (postoperative nausea and vomiting)   . PTSD (post-traumatic stress disorder)   . Schizophrenia ([place])   . Tobacco use    Past Surgical History:  Past Surgical History:  Procedure Laterality Date  . ABDOMINAL HYSTERECTOMY    . CARDIAC CATHETERIZATION    . CHOLECYSTECTOMY    . COLONOSCOPY  05/14/14   hemorrhoids, otherwise normal; Dr. Wilford Corner  . COLONOSCOPY WITH PROPOFOL N/A 05/14/2014   Procedure: COLONOSCOPY WITH PROPOFOL;  Surgeon: Wilford Corner, MD;  [facility];  Service: Endoscopy;  Laterality: N/A;  . ESOPHAGOGASTRODUODENOSCOPY (EGD) WITH PROPOFOL N/A 05/14/2014   Procedure: ESOPHAGOGASTRODUODENOSCOPY (EGD) WITH PROPOFOL;  Surgeon: Wilford Corner, MD;  Location: [facility] ENDOSCOPY;  Service: Endoscopy;  Laterality: N/A;  . FRACTURE SURGERY     left leg  . LEG SURGERY    . TUBAL  LIGATION      Social History:  reports that she quit smoking about 4 years ago. She has never used smokeless tobacco. She reports that she does not drink alcohol or use drugs. Family History:  Family History  Problem Relation Age of Onset  . Diabetes Mother   . Kidney failure Mother   . Diabetes Other   . Cancer Other   . Hypertension Other   . Breast cancer Sister 1453  . Breast cancer Maternal Grandmother      HOME MEDICATIONS: Allergies as of 09/19/2018      Reactions   Dexilant [dexlansoprazole] Shortness Of Breath,  Diarrhea, Nausea And Vomiting, Other (See Comments)   Chest pain and abdominal pain (also)   Famotidine Anaphylaxis   Meperidine Hcl Anaphylaxis   Dilaudid [hydromorphone Hcl] Other (See Comments)   Change in mental state   Gabapentin Other (See Comments)   Memory loss   Hydrocodone Nausea And Vomiting   Ramipril Swelling, Other (See Comments)   Angioedema   Ziprasidone Hcl Other (See Comments)   Caused convulsions   Invokana [canagliflozin] Rash   Iohexol Itching, Rash   Tape Rash   Use paper tape only       Medication List       Accurate as of September 19, 2018  1:05 PM. If you have any questions, ask your nurse or doctor.        albuterol 108 (90 Base) MCG/ACT inhaler Commonly known as: VENTOLIN HFA INHALE 2 PUFFS INTO THE LUNGS EVERY 6 HOURS AS NEEDED FOR WHEEZING   ALPRAZolam 1 MG tablet Commonly known as: XANAX Take 1 mg by mouth 3 (three) times daily. FOR ANXIETY   amantadine 100 MG capsule Commonly known as: SYMMETREL Take 100 mg by mouth 2 (two) times daily. FOR TREMORS   amLODipine 10 MG tablet Commonly known as: NORVASC TAKE 1 TABLET(10 MG) BY MOUTH DAILY   atenolol 50 MG tablet Commonly known as: TENORMIN Take 1 tablet (50 mg total) by mouth 2 (two) times daily.   Austedo 6 MG Tabs Generic drug: Deutetrabenazine Take 6 mg by mouth 2 (two) times daily.   benztropine 2 MG tablet Commonly known as: COGENTIN Take 2 mg by mouth 2 (two) times daily. FOR TREMORS AND MOUTH MOVEMENTS   busPIRone 15 MG tablet Commonly known as: BUSPAR Take 15 mg by mouth 2 (two) times daily. Reported on 04/20/2015   diphenhydrAMINE 25 mg capsule Commonly known as: BENADRYL Take 50 mg by mouth at bedtime. Reported on 04/07/2015   Fanapt 6 MG Tabs Generic drug: Iloperidone   FLUoxetine 40 MG capsule Commonly known as: PROZAC Take 40 mg by mouth daily.   Fluticasone-Salmeterol 250-50 MCG/DOSE Aepb Commonly known as: Advair Diskus Inhale 1 puff into the lungs 2 (two)  times daily.   fluvoxaMINE 25 MG tablet Commonly known as: LUVOX Take 25 mg by mouth 2 (two) times daily.   glucose blood test strip 1 each by Other route 3 (three) times daily. Use as instructed   insulin aspart protamine - aspart (70-30) 100 UNIT/ML FlexPen Commonly known as: NovoLOG Mix 70/30 FlexPen Inject 0.34 mLs (34 Units total) into the skin 2 (two) times daily with a meal.   Insulin Pen Needle 32G X 4 MM Misc Commonly known as: NovoFine Plus USE TWICE DAILY   Invega Trinza 819 MG/2.625ML Susp Generic drug: Paliperidone Palmitate   losartan-hydrochlorothiazide 100-25 MG tablet Commonly known as: HYZAAR Take 1 tablet by mouth daily.   loxapine 25  MG capsule Commonly known as: LOXITANE TAKE 1 TO 2 CAPSULES BY MOUTH   metFORMIN 500 MG 24 hr tablet Commonly known as: GLUCOPHAGE-XR Take 2 tablets (1,000 mg total) by mouth daily with breakfast.   nitroGLYCERIN 0.4 MG SL tablet Commonly known as: NITROSTAT Place 0.4 mg under the tongue every 5 (five) minutes as needed for chest pain. Reported on 07/15/2015   pantoprazole 40 MG tablet Commonly known as: PROTONIX TAKE 1 TABLET BY MOUTH TWICE DAILY   rosuvastatin 20 MG tablet Commonly known as: Crestor Take 1 tablet (20 mg total) by mouth daily.   Vraylar capsule Generic drug: cariprazine TK 1 C PO HS   zolpidem 10 MG tablet Commonly known as: AMBIEN Take 1 tablet by mouth at bedtime.        OBJECTIVE:   Vital Signs: BP 128/88 (BP Location: Left Arm, Patient Position: Sitting, Cuff Size: Large)   Pulse 72   Temp 98.6 F (37 C)   Ht 5\' 6"  (1.676 m)   Wt 225 lb (102.1 kg)   SpO2 98%   BMI 36.32 kg/m   Wt Readings from Last 3 Encounters:  09/19/18 225 lb (102.1 kg)  08/08/18 214 lb (97.1 kg)  07/11/18 214 lb 9.6 oz (97.3 kg)     Exam: General: Pt appears well and is in NAD  Neck: General: Supple without adenopathy. Thyroid: Thyroid size normal.  No goiter or nodules appreciated. No thyroid bruit.   Lungs: Clear with good BS bilat with no rales, rhonchi, or wheezes  Heart: RRR with normal S1 and S2 and no gallops; no murmurs; no rub  Abdomen: Normoactive bowel sounds, soft, nontender, without masses or organomegaly palpable  Extremities: No pretibial edema. No tremor.  Skin: Normal texture and temperature to palpation. No rash noted.Some lipohypertrophy noted at the lower abdominal wall   Neuro: MS is good with appropriate affect, pt is alert and Ox3    DM foot exam: 07/11/2018 The skin of the feet is intact without sores or ulcerations.pt with plantar callous formation.  The pedal pulses are 2+ on right and 2+ on left. The sensation is intact to a screening 5.07, 10 gram monofilament bilaterally     DATA REVIEWED:  Lab Results  Component Value Date   HGBA1C 13.0 (H) 07/03/2018   HGBA1C 11.7 (H) 03/13/2018   HGBA1C 9.6 (H) 10/11/2017   Lab Results  Component Value Date   MICROALBUR 0.9 07/01/2016   LDLCALC 101 (H) 10/11/2017   CREATININE 0.87 07/03/2018   Lab Results  Component Value Date   MICRALBCREAT 5 07/01/2016     Lab Results  Component Value Date   CHOL 182 10/11/2017   HDL 63 10/11/2017   LDLCALC 101 (H) 10/11/2017   TRIG 90 10/11/2017   CHOLHDL 2.9 10/11/2017       In-office BG was 333  mg/dL.    ASSESSMENT / PLAN / RECOMMENDATIONS:   1) Type 2 Diabetes Mellitus, Poorly controlled, Without complications - Most recent A1c of 12.6 %. Goal A1c < 7.0 %.   Plan: - She continues with hyperglycemia, today her BG in the office was 333 mg/dL , despite taking 34 units of Novolog Mix with Breakfast, we will increase dosing as below - She was also advised to avoid injecting at the lower abdominal wall, and to use the upper or the sides of her thighs.  - Pt encouraged to contact our office if Bg's remain above 200 mg/dL.  - She had declined a CDE referral  in the past  - We again discussed the importance of glucose checks and having this data available to me.     MEDICATIONS:  Increase Novolog Mix to 44 units BID with meals (breakfast and Supper)  Increase Metformin 500 mg XR , 2 Tabs with breakfast and 1 tab with supper  EDUCATION / INSTRUCTIONS:  BG monitoring instructions: Patient is instructed to check her blood sugars 2 times a day, breakfast and supper.  Call [facility] [place] [facility] if: BG persistently < 70 or > 300. . I reviewed the Rule of 15 for the treatment of hypoglycemia in detail with the patient. Literature supplied.     F/U in 6 weeks    Signed electronically by: Lyndle HerrlichAbby Jaralla Adarryl Goldammer, MD  [facility]  [facility] [address] [place], [place] [address] Phone: [phone] FAX: [phone]   CC: Jac Canavanysinger, David S, PA-C [address] [place] [address] Phone: [phone]  Fax: [phone]  Return to Endocrinology clinic as below: No future appointments.

## 2018-09-25 ENCOUNTER — Other Ambulatory Visit: Payer: Self-pay | Admitting: Medical

## 2018-09-25 NOTE — Telephone Encounter (Signed)
[facility] is requesting to fill pt  Pt ventolin. [place]

## 2018-09-28 DIAGNOSIS — F200 Paranoid schizophrenia: Secondary | ICD-10-CM | POA: Diagnosis not present

## 2018-09-28 DIAGNOSIS — F411 Generalized anxiety disorder: Secondary | ICD-10-CM | POA: Diagnosis not present

## 2018-09-28 DIAGNOSIS — Z79899 Other long term (current) drug therapy: Secondary | ICD-10-CM | POA: Diagnosis not present

## 2018-10-23 ENCOUNTER — Other Ambulatory Visit: Payer: Self-pay | Admitting: Medical

## 2018-11-01 ENCOUNTER — Other Ambulatory Visit: Payer: Self-pay | Admitting: Medical

## 2018-11-06 ENCOUNTER — Other Ambulatory Visit: Payer: Self-pay

## 2018-11-06 ENCOUNTER — Ambulatory Visit (INDEPENDENT_AMBULATORY_CARE_PROVIDER_SITE_OTHER): Payer: Medicare Other | Admitting: Medical

## 2018-11-06 ENCOUNTER — Encounter: Payer: Self-pay | Admitting: Medical

## 2018-11-06 VITALS — BP 172/90 | HR 87 | Temp 98.4°F | Ht 64.0 in | Wt 229.8 lb

## 2018-11-06 DIAGNOSIS — I10 Essential (primary) hypertension: Secondary | ICD-10-CM | POA: Diagnosis not present

## 2018-11-06 DIAGNOSIS — E119 Type 2 diabetes mellitus without complications: Secondary | ICD-10-CM

## 2018-11-06 DIAGNOSIS — J449 Chronic obstructive pulmonary disease, unspecified: Secondary | ICD-10-CM

## 2018-11-06 DIAGNOSIS — Z79899 Other long term (current) drug therapy: Secondary | ICD-10-CM | POA: Diagnosis not present

## 2018-11-06 DIAGNOSIS — Z7189 Other specified counseling: Secondary | ICD-10-CM

## 2018-11-06 DIAGNOSIS — R899 Unspecified abnormal finding in specimens from other organs, systems and tissues: Secondary | ICD-10-CM | POA: Diagnosis not present

## 2018-11-06 DIAGNOSIS — E669 Obesity, unspecified: Secondary | ICD-10-CM

## 2018-11-06 DIAGNOSIS — K439 Ventral hernia without obstruction or gangrene: Secondary | ICD-10-CM

## 2018-11-06 DIAGNOSIS — Z794 Long term (current) use of insulin: Secondary | ICD-10-CM | POA: Diagnosis not present

## 2018-11-06 DIAGNOSIS — F319 Bipolar disorder, unspecified: Secondary | ICD-10-CM | POA: Diagnosis not present

## 2018-11-06 DIAGNOSIS — E785 Hyperlipidemia, unspecified: Secondary | ICD-10-CM

## 2018-11-06 DIAGNOSIS — E559 Vitamin D deficiency, unspecified: Secondary | ICD-10-CM | POA: Diagnosis not present

## 2018-11-06 DIAGNOSIS — F411 Generalized anxiety disorder: Secondary | ICD-10-CM | POA: Diagnosis not present

## 2018-11-06 DIAGNOSIS — Z7185 Encounter for immunization safety counseling: Secondary | ICD-10-CM

## 2018-11-06 DIAGNOSIS — Z87891 Personal history of nicotine dependence: Secondary | ICD-10-CM | POA: Diagnosis not present

## 2018-11-06 DIAGNOSIS — IMO0001 Reserved for inherently not codable concepts without codable children: Secondary | ICD-10-CM

## 2018-11-06 NOTE — Patient Instructions (Signed)
You have a ventral hernia that has gotten bigger.  We will refer you to general surgery for consult on repairing this.  In the meantime you can continue wearing the leggins stockings that go up to abdomen high   Vaccines: You are due for several vaccines.  I recommend the following vaccines, and you may want to check your insurance coverage for these.  We can do all these vaccines here.  Make sure your insurance allows you to get these vaccines here versus the pharmacy.  You are due for tetanus vaccine is your last tetanus was 10 years ago  You are due for pneumococcal 23 vaccine, which is typically given every 5 years  I recommend a yearly flu shot  I recommended to dose Shingrix vaccine

## 2018-11-06 NOTE — Progress Notes (Signed)
Subjective: Chief Complaint  Patient presents with  . Abdominal Pain    knot right above navel    Medical team: Dr. Coral Ceo, OB/GYN Dr. Terrace Arabia, endocrinology Dr. Rinaldo Cloud, cardiology Dr. Dolores Frame, psychiatry Dr. Charlott Rakes, gastroenterology Tysinger, Kermit Balo, PA-C here for primary care  Here for c/o knot above navel.  Has been there before but has gotten bigger.  She notes intermittent discomfort.  When lying doesn't feel this, but when standing can feel it.  No recent heavy lifting.  No recent injury or trauma.  No bruising.  Otherwise in usual state of health.  She has been seeing a diabetes specialist.  She notes that her sugars have been looking better.  She notes compliance of medication.  She continues to smoke.  She notes no recent problems breathing.  She is compliant with her inhalers.  She denies chest pain, edema, dyspnea on exertion  No other new complaint  Past Medical History:  Diagnosis Date  . Alopecia   . Anemia   . Anxiety   . Bipolar 1 disorder (HCC)   . COPD (chronic obstructive pulmonary disease) (HCC)   . Coronary artery disease   . Depression   . Diabetes mellitus 2010  . Gastritis   . GERD (gastroesophageal reflux disease)   . Headache    migraines  . Hypertension 2010  . Obesity   . Pneumonia   . PONV (postoperative nausea and vomiting)   . PTSD (post-traumatic stress disorder)   . Schizophrenia (HCC)   . Tobacco use    Current Outpatient Medications on File Prior to Visit  Medication Sig Dispense Refill  . albuterol (VENTOLIN HFA) 108 (90 Base) MCG/ACT inhaler INHALE 2 PUFFS INTO THE LUNGS EVERY 6 HOURS AS NEEDED FOR WHEEZING 54 g 0  . amantadine (SYMMETREL) 100 MG capsule Take 100 mg by mouth 2 (two) times daily. FOR TREMORS  0  . amLODipine (NORVASC) 10 MG tablet TAKE 1 TABLET(10 MG) BY MOUTH DAILY 90 tablet 1  . atenolol (TENORMIN) 50 MG tablet Take 1 tablet (50 mg total) by mouth 2 (two) times  daily. 180 tablet 3  . AUSTEDO 6 MG TABS Take 6 mg by mouth 2 (two) times daily.  0  . benztropine (COGENTIN) 2 MG tablet Take 2 mg by mouth 2 (two) times daily. FOR TREMORS AND MOUTH MOVEMENTS  0  . busPIRone (BUSPAR) 15 MG tablet Take 15 mg by mouth 2 (two) times daily. Reported on 04/20/2015    . diphenhydrAMINE (BENADRYL) 25 mg capsule Take 50 mg by mouth at bedtime. Reported on 04/07/2015    . FANAPT 6 MG TABS   0  . FLUoxetine (PROZAC) 40 MG capsule Take 40 mg by mouth daily.  0  . Fluticasone-Salmeterol (ADVAIR DISKUS) 250-50 MCG/DOSE AEPB Inhale 1 puff into the lungs 2 (two) times daily. 60 each 5  . fluvoxaMINE (LUVOX) 25 MG tablet Take 25 mg by mouth 2 (two) times daily.    Marland Kitchen glucose blood test strip 1 each by Other route 3 (three) times daily. Use as instructed 100 each 5  . insulin aspart protamine - aspart (NOVOLOG MIX 70/30 FLEXPEN) (70-30) 100 UNIT/ML FlexPen Inject 0.44 mLs (44 Units total) into the skin 2 (two) times daily with a meal. 27 mL 11  . Insulin Pen Needle (NOVOFINE PLUS) 32G X 4 MM MISC USE TWICE DAILY 100 each 11  . INVEGA TRINZA 819 MG/2.625ML SUSP   0  . losartan-hydrochlorothiazide (HYZAAR) 100-25 MG  tablet Take 1 tablet by mouth daily. 90 tablet 3  . loxapine (LOXITANE) 25 MG capsule TAKE 1 TO 2 CAPSULES BY MOUTH  1  . metFORMIN (GLUCOPHAGE-XR) 500 MG 24 hr tablet Take 2 tablets (1,000 mg total) by mouth daily with breakfast AND 1 tablet (500 mg total) daily with supper. 270 tablet 1  . nitroGLYCERIN (NITROSTAT) 0.4 MG SL tablet Place 0.4 mg under the tongue every 5 (five) minutes as needed for chest pain. Reported on 07/15/2015    . pantoprazole (PROTONIX) 40 MG tablet TAKE 1 TABLET BY MOUTH TWICE DAILY 180 tablet 0  . rosuvastatin (CRESTOR) 20 MG tablet Take 1 tablet (20 mg total) by mouth daily. 90 tablet 3  . VRAYLAR capsule TK 1 C PO HS  1  . ALPRAZolam (XANAX) 1 MG tablet Take 1 mg by mouth 3 (three) times daily. FOR ANXIETY  0  . zolpidem (AMBIEN) 10 MG tablet  Take 1 tablet by mouth at bedtime.   0   No current facility-administered medications on file prior to visit.    Past Surgical History:  Procedure Laterality Date  . ABDOMINAL HYSTERECTOMY    . CARDIAC CATHETERIZATION    . CHOLECYSTECTOMY    . COLONOSCOPY  05/14/14   hemorrhoids, otherwise normal; Dr. Wilford Corner  . COLONOSCOPY WITH PROPOFOL N/A 05/14/2014   Procedure: COLONOSCOPY WITH PROPOFOL;  Surgeon: Wilford Corner, MD;  Location: [facility] ENDOSCOPY;  Service: Endoscopy;  Laterality: N/A;  . ESOPHAGOGASTRODUODENOSCOPY (EGD) WITH PROPOFOL N/A 05/14/2014   Procedure: ESOPHAGOGASTRODUODENOSCOPY (EGD) WITH PROPOFOL;  Surgeon: Wilford Corner, MD;  Location: [facility] ENDOSCOPY;  Service: Endoscopy;  Laterality: N/A;  . FRACTURE SURGERY     left leg  . LEG SURGERY    . TUBAL LIGATION      ROS as in subjective   Objective: BP (!) 172/90   Pulse 87   Temp 98.4 F (36.9 C) (Oral)   Ht 5\' 4"  (1.626 m)   Wt 229 lb 12.8 oz (104.2 kg)   SpO2 98%   BMI 39.45 kg/m   Wt Readings from Last 3 Encounters:  11/06/18 229 lb 12.8 oz (104.2 kg)  09/19/18 225 lb (102.1 kg)  08/08/18 214 lb (97.1 kg)   BP Readings from Last 3 Encounters:  11/06/18 (!) 172/90  09/19/18 128/88  08/08/18 122/80   Gen: wd, wn, nad Lungs clear Heart rrr, normla s1s2, no murmur Ext: no edema Abdomen right upper quadrant surgical scar, there is a fullness in the central abdomen above the umbilicus that increases with Valsalva and coughing consistent with a hernia, it is reducible.  No bruising or discoloration of the skin, no other mass or tenderness in the abdomen     Assessment: Encounter Diagnoses  Name Primary?  . Ventral hernia without obstruction or gangrene Yes  . Vaccine counseling   . Essential hypertension   . Chronic obstructive pulmonary disease, unspecified COPD type ([place])   . IDDM (insulin dependent diabetes mellitus) ([place])   . Obesity with serious comorbidity, unspecified classification,  unspecified obesity type   . Bipolar affective disorder, remission status unspecified ([place])   . Generalized anxiety disorder   . Hyperlipidemia, unspecified hyperlipidemia type   . High risk medication use   . Former smoker      Plan: Ventral hernia- given her increasing pain and discomfort, we will refer to general surgery  Counseled on vaccines.  She declines vaccines today.  She is due for tetanus, flu, pneumococcal 23, Shingrix  COPD-stable, no complaints,  continue current medications  Hypertension not at goal today, advised to follow-up with her cardiologist.  We will request records from Dr. Sharyn LullHarwani her cardiologist from most recent office note.  Hyperlipidemia-continue statin  Former smoker - quit 4 years ago  Cheyenne StacksWilma was seen today for abdominal pain.  Diagnoses and all orders for this visit:  Ventral hernia without obstruction or gangrene -     Ambulatory referral to General Surgery  Vaccine counseling  Essential hypertension -     Comprehensive metabolic panel -     CBC with Differential/Platelet -     Lipid panel  Chronic obstructive pulmonary disease, unspecified COPD type (HCC)  IDDM (insulin dependent diabetes mellitus) (HCC) -     Comprehensive metabolic panel -     CBC with Differential/Platelet -     Lipid panel  Obesity with serious comorbidity, unspecified classification, unspecified obesity type  Bipolar affective disorder, remission status unspecified (HCC)  Generalized anxiety disorder  Hyperlipidemia, unspecified hyperlipidemia type  High risk medication use -     Comprehensive metabolic panel -     CBC with Differential/Platelet -     Lipid panel  Former smoker

## 2018-11-07 ENCOUNTER — Other Ambulatory Visit: Payer: Self-pay | Admitting: Medical

## 2018-11-07 LAB — CBC WITH DIFFERENTIAL/PLATELET
Basophils Absolute: 0 10*3/uL (ref 0.0–0.2)
Basos: 0 %
EOS (ABSOLUTE): 0.1 10*3/uL (ref 0.0–0.4)
Eos: 1 %
Hematocrit: 39.2 % (ref 34.0–46.6)
Hemoglobin: 12.8 g/dL (ref 11.1–15.9)
Immature Grans (Abs): 0 10*3/uL (ref 0.0–0.1)
Immature Granulocytes: 0 %
Lymphocytes Absolute: 3.2 10*3/uL — ABNORMAL HIGH (ref 0.7–3.1)
Lymphs: 31 %
MCH: 26 pg — ABNORMAL LOW (ref 26.6–33.0)
MCHC: 32.7 g/dL (ref 31.5–35.7)
MCV: 80 fL (ref 79–97)
Monocytes Absolute: 0.6 10*3/uL (ref 0.1–0.9)
Monocytes: 6 %
Neutrophils Absolute: 6.4 10*3/uL (ref 1.4–7.0)
Neutrophils: 62 %
Platelets: 312 10*3/uL (ref 150–450)
RBC: 4.93 x10E6/uL (ref 3.77–5.28)
RDW: 14.1 % (ref 11.7–15.4)
WBC: 10.3 10*3/uL (ref 3.4–10.8)

## 2018-11-07 LAB — COMPREHENSIVE METABOLIC PANEL
ALT: 15 IU/L (ref 0–32)
AST: 15 IU/L (ref 0–40)
Albumin/Globulin Ratio: 1.2 (ref 1.2–2.2)
Albumin: 3.6 g/dL — ABNORMAL LOW (ref 3.8–4.9)
Alkaline Phosphatase: 131 IU/L — ABNORMAL HIGH (ref 39–117)
BUN/Creatinine Ratio: 9 (ref 9–23)
BUN: 8 mg/dL (ref 6–24)
Bilirubin Total: 0.3 mg/dL (ref 0.0–1.2)
CO2: 22 mmol/L (ref 20–29)
Calcium: 9.1 mg/dL (ref 8.7–10.2)
Chloride: 93 mmol/L — ABNORMAL LOW (ref 96–106)
Creatinine, Ser: 0.86 mg/dL (ref 0.57–1.00)
GFR calc Af Amer: 89 mL/min/{1.73_m2} (ref >59)
GFR calc non Af Amer: 77 mL/min/{1.73_m2} (ref >59)
Globulin, Total: 3.1 g/dL (ref 1.5–4.5)
Glucose: 306 mg/dL — ABNORMAL HIGH (ref 65–99)
Potassium: 5 mmol/L (ref 3.5–5.2)
Sodium: 132 mmol/L — ABNORMAL LOW (ref 134–144)
Total Protein: 6.7 g/dL (ref 6.0–8.5)

## 2018-11-07 LAB — LIPID PANEL
Chol/HDL Ratio: 3.6 ratio (ref 0.0–4.4)
Cholesterol, Total: 213 mg/dL — ABNORMAL HIGH (ref 100–199)
HDL: 59 mg/dL (ref >39)
LDL Chol Calc (NIH): 136 mg/dL — ABNORMAL HIGH (ref 0–99)
Triglycerides: 101 mg/dL (ref 0–149)
VLDL Cholesterol Cal: 18 mg/dL (ref 5–40)

## 2018-11-13 ENCOUNTER — Other Ambulatory Visit: Payer: Self-pay | Admitting: Medical

## 2018-11-13 LAB — SPECIMEN STATUS REPORT

## 2018-11-13 LAB — VITAMIN D 25 HYDROXY (VIT D DEFICIENCY, FRACTURES): Vit D, 25-Hydroxy: 12.8 ng/mL — ABNORMAL LOW (ref 30.0–100.0)

## 2018-11-13 MED ORDER — ROSUVASTATIN CALCIUM 20 MG PO TABS: 20.0000 mg | ORAL_TABLET | Freq: Every day | ORAL | 3 refills | Status: DC

## 2018-11-13 MED ORDER — VITAMIN D 25 MCG (1000 UNIT) PO TABS: 1000.0000 [IU] | ORAL_TABLET | Freq: Every day | ORAL | 3 refills | Status: DC

## 2018-11-28 ENCOUNTER — Other Ambulatory Visit (HOSPITAL_COMMUNITY)
Admission: RE | Admit: 2018-11-28 | Discharge: 2018-11-28 | Disposition: A | Discharge status: Home / Self Care | Payer: Medicare Other | Source: Ambulatory Visit | Attending: Women's Health | Admitting: Women's Health

## 2018-11-28 ENCOUNTER — Other Ambulatory Visit: Payer: Self-pay

## 2018-11-28 ENCOUNTER — Ambulatory Visit (INDEPENDENT_AMBULATORY_CARE_PROVIDER_SITE_OTHER): Payer: Medicare Other | Admitting: Women's Health

## 2018-11-28 VITALS — BP 155/92 | HR 109 | Wt 226.0 lb

## 2018-11-28 DIAGNOSIS — L292 Pruritus vulvae: Secondary | ICD-10-CM

## 2018-11-28 DIAGNOSIS — N898 Other specified noninflammatory disorders of vagina: Secondary | ICD-10-CM

## 2018-11-28 MED ORDER — NYSTATIN-TRIAMCINOLONE 100000-0.1 UNIT/GM-% EX OINT: 1.0000 "application " | TOPICAL_OINTMENT | Freq: Two times a day (BID) | CUTANEOUS | 0 refills | Status: DC

## 2018-11-28 MED ORDER — TERCONAZOLE 0.8 % VA CREA: 1.0000 | TOPICAL_CREAM | Freq: Every day | VAGINAL | 0 refills | Status: DC

## 2018-11-28 NOTE — Progress Notes (Signed)
Pt states she is diabetic and gets frequent yeast inf.  Pt states she has been irritated externally as well x 2 weeks.  Pt has used OTC medication.

## 2018-11-28 NOTE — Patient Instructions (Signed)
Bacterial Vaginosis  Bacterial vaginosis is a vaginal infection that occurs when the normal balance of bacteria in the vagina is disrupted. It results from an overgrowth of certain bacteria. This is the most common vaginal infection among women ages 71-44. Because bacterial vaginosis increases your risk for STIs (sexually transmitted infections), getting treated can help reduce your risk for chlamydia, gonorrhea, herpes, and HIV (human immunodeficiency virus). Treatment is also important for preventing complications in pregnant women, because this condition can cause an early (premature) delivery. What are the causes? This condition is caused by an increase in harmful bacteria that are normally present in small amounts in the vagina. However, the reason that the condition develops is not fully understood. What increases the risk? The following factors may make you more likely to develop this condition:  Having a new sexual partner or multiple sexual partners.  Having unprotected sex.  Douching.  Having an intrauterine device (IUD).  Smoking.  Drug and alcohol abuse.  Taking certain antibiotic medicines.  Being pregnant. You cannot get bacterial vaginosis from toilet seats, bedding, swimming pools, or contact with objects around you. What are the signs or symptoms? Symptoms of this condition include:  Grey or white vaginal discharge. The discharge can also be watery or foamy.  A fish-like odor with discharge, especially after sexual intercourse or during menstruation.  Itching in and around the vagina.  Burning or pain with urination. Some women with bacterial vaginosis have no signs or symptoms. How is this diagnosed? This condition is diagnosed based on:  Your medical history.  A physical exam of the vagina.  Testing a sample of vaginal fluid under a microscope to look for a large amount of bad bacteria or abnormal cells. Your health care provider may use a cotton swab or  a small wooden spatula to collect the sample. How is this treated? This condition is treated with antibiotics. These may be given as a pill, a vaginal cream, or a medicine that is put into the vagina (suppository). If the condition comes back after treatment, a second round of antibiotics may be needed. Follow these instructions at home: Medicines  Take over-the-counter and prescription medicines only as told by your health care provider.  Take or use your antibiotic as told by your health care provider. Do not stop taking or using the antibiotic even if you start to feel better. General instructions  If you have a female sexual partner, tell her that you have a vaginal infection. She should see her health care provider and be treated if she has symptoms. If you have a female sexual partner, he does not need treatment.  During treatment: ? Avoid sexual activity until you finish treatment. ? Do not douche. ? Avoid alcohol as directed by your health care provider. ? Avoid breastfeeding as directed by your health care provider.  Drink enough water and fluids to keep your urine clear or pale yellow.  Keep the area around your vagina and rectum clean. ? Wash the area daily with warm water. ? Wipe yourself from front to back after using the toilet.  Keep all follow-up visits as told by your health care provider. This is important. How is this prevented?  Do not douche.  Wash the outside of your vagina with warm water only.  Use protection when having sex. This includes latex condoms and dental dams.  Limit how many sexual partners you have. To help prevent bacterial vaginosis, it is best to have sex with just one partner (  monogamous).  Make sure you and your sexual partner are tested for STIs.  Wear cotton or cotton-lined underwear.  Avoid wearing tight pants and pantyhose, especially during summer.  Limit the amount of alcohol that you drink.  Do not use any products that contain  nicotine or tobacco, such as cigarettes and e-cigarettes. If you need help quitting, ask your health care provider.  Do not use illegal drugs. Where to find more information  Centers for Disease Control and Prevention: AppraiserFraud.fi  American Sexual Health Association (ASHA): www.ashastd.org  U.S. Department of Health and Financial controller, Office on Women's Health: DustingSprays.pl or SecuritiesCard.it Contact a health care provider if:  Your symptoms do not improve, even after treatment.  You have more discharge or pain when urinating.  You have a fever.  You have pain in your abdomen.  You have pain during sex.  You have vaginal bleeding between periods. Summary  Bacterial vaginosis is a vaginal infection that occurs when the normal balance of bacteria in the vagina is disrupted.  Because bacterial vaginosis increases your risk for STIs (sexually transmitted infections), getting treated can help reduce your risk for chlamydia, gonorrhea, herpes, and HIV (human immunodeficiency virus). Treatment is also important for preventing complications in pregnant women, because the condition can cause an early (premature) delivery.  This condition is treated with antibiotic medicines. These may be given as a pill, a vaginal cream, or a medicine that is put into the vagina (suppository). This information is not intended to replace advice given to you by your health care provider. Make sure you discuss any questions you have with your health care provider. Document Released: 02/14/2005 Document Revised: 01/27/2017 Document Reviewed: 10/31/2015 Elsevier Patient Education  2020 [place]. Vaginal Yeast infection, Adult  Vaginal yeast infection is a condition that causes vaginal discharge as well as soreness, swelling, and redness (inflammation) of the vagina. This is a common condition. Some women get this infection frequently. What are the  causes? This condition is caused by a change in the normal balance of the yeast (candida) and bacteria that live in the vagina. This change causes an overgrowth of yeast, which causes the inflammation. What increases the risk? The condition is more likely to develop in women who:  Take antibiotic medicines.  Have diabetes.  Take birth control pills.  Are pregnant.  Douche often.  Have a weak body defense system (immune system).  Have been taking steroid medicines for a long time.  Frequently wear tight clothing. What are the signs or symptoms? Symptoms of this condition include:  White, thick, creamy vaginal discharge.  Swelling, itching, redness, and irritation of the vagina. The lips of the vagina (vulva) may be affected as well.  Pain or a burning feeling while urinating.  Pain during sex. How is this diagnosed? This condition is diagnosed based on:  Your medical history.  A physical exam.  A pelvic exam. Your health care provider will examine a sample of your vaginal discharge under a microscope. Your health care provider may send this sample for testing to confirm the diagnosis. How is this treated? This condition is treated with medicine. Medicines may be over-the-counter or prescription. You may be told to use one or more of the following:  Medicine that is taken by mouth (orally).  Medicine that is applied as a cream (topically).  Medicine that is inserted directly into the vagina (suppository). Follow these instructions at home:  Lifestyle  Do not have sex until your health care provider approves.  Tell your sex partner that you have a yeast infection. That person should go to his or her health care provider and ask if they should also be treated.  Do not wear tight clothes, such as pantyhose or tight pants.  Wear breathable cotton underwear. General instructions  Take or apply over-the-counter and prescription medicines only as told by your health  care provider.  Eat more yogurt. This may help to keep your yeast infection from returning.  Do not use tampons until your health care provider approves.  Try taking a sitz bath to help with discomfort. This is a warm water bath that is taken while you are sitting down. The water should only come up to your hips and should cover your buttocks. Do this 3-4 times per day or as told by your health care provider.  Do not douche.  If you have diabetes, keep your blood sugar levels under control.  Keep all follow-up visits as told by your health care provider. This is important. Contact a health care provider if:  You have a fever.  Your symptoms go away and then return.  Your symptoms do not get better with treatment.  Your symptoms get worse.  You have new symptoms.  You develop blisters in or around your vagina.  You have blood coming from your vagina and it is not your menstrual period.  You develop pain in your abdomen. Summary  Vaginal yeast infection is a condition that causes discharge as well as soreness, swelling, and redness (inflammation) of the vagina.  This condition is treated with medicine. Medicines may be over-the-counter or prescription.  Take or apply over-the-counter and prescription medicines only as told by your health care provider.  Do not douche. Do not have sex or use tampons until your health care provider approves.  Contact a health care provider if your symptoms do not get better with treatment or your symptoms go away and then return. This information is not intended to replace advice given to you by your health care provider. Make sure you discuss any questions you have with your health care provider. Document Released: 11/24/2004 Document Revised: 07/03/2017 Document Reviewed: 07/03/2017 Elsevier Patient Education  2020 [employer].

## 2018-11-28 NOTE — Progress Notes (Signed)
History:  Ms. Oma Marzan is a 53 y.o. [id number] who presents to clinic today for vaginal and vulvar itching that she states is significant and very "sore" and uncomfortable. Pt reports symptoms have been present for two weeks. Pt reports hx of uncontrolled diabetes with recently elevated BS numbers as well as douching.  The following portions of the patient's history were reviewed and updated as appropriate: allergies, current medications, family history, past medical history, social history, past surgical history and problem list.  Review of Systems:  Review of Systems  Constitutional: Negative for chills, fever and weight loss.  Respiratory: Negative for shortness of breath.   Cardiovascular: Negative for chest pain.  Gastrointestinal: Negative for nausea and vomiting.  Genitourinary:       Vaginal and vulvar itching  Neurological: Negative for dizziness and headaches.     Objective:  Physical Exam BP (!) 155/92   Pulse (!) 109   Wt 226 lb (102.5 kg)   BMI 38.79 kg/m  Physical Exam  Constitutional: She is oriented to person, place, and time. She appears well-developed and well-nourished. No distress.  HENT:  Head: Normocephalic and atraumatic.  Respiratory: Effort normal.  GI: Soft.  Genitourinary: There is no rash, tenderness or lesion on the right labia. There is no rash, tenderness or lesion on the left labia.    Vaginal discharge (minimal, white, clumped) present.     No vaginal tenderness or bleeding.  No tenderness or bleeding in the vagina.    Genitourinary Comments: Area of altered pigmentation with minimal crepe-paper-like appearance around perineum, where pt reports she is experiencing significant itching and discomfort.  Uterus and cervix surgically absent.  Pt reports douching immediately before appointment to "clean-up" reported increased, thick, white, clumped vaginal discharge.   Neurological: She is alert and oriented to person, place, and time.  Skin:  Skin is warm and dry. She is not diaphoretic.  Psychiatric: She has a normal mood and affect. Her behavior is normal. Judgment and thought content normal.   Labs and Imaging No results found for this or any previous visit (from the past 24 hour(s)).  No results found.   Assessment & Plan:   1. Vaginal itching - Cervicovaginal ancillary only( [place]) - terconazole (TERAZOL 3) 0.8 % vaginal cream; Place 1 applicator vaginally at bedtime.  Dispense: 20 g; Refill: 0  2. Vulvar itching - Cervicovaginal ancillary only( [place]) - nystatin-triamcinolone ointment (MYCOLOG); Apply 1 application topically 2 (two) times daily.  Dispense: 30 g; Refill: 0  - discouraged douching, discussed appropriate vulvovaginal hygiene and possible negative sequelae - RTC in 2wks for reevlauation of symptoms and possible additional/alternative treatments. Pt advised not to use steroid cream for greater than 14days and to wash hands well before and after use - pt encouraged to f/u with PCP regarding BP and glucose numbers  Farron Watrous, Gerrie Nordmann, NP 11/28/2018 4:56 PM

## 2018-12-03 LAB — CERVICOVAGINAL ANCILLARY ONLY
Bacterial Vaginitis (gardnerella): POSITIVE — AB
Candida Glabrata: POSITIVE — AB
Candida Vaginitis: POSITIVE — AB
Chlamydia: NEGATIVE
Neisseria Gonorrhea: NEGATIVE
Trichomonas: NEGATIVE

## 2018-12-04 DIAGNOSIS — Z79899 Other long term (current) drug therapy: Secondary | ICD-10-CM | POA: Diagnosis not present

## 2018-12-04 DIAGNOSIS — F200 Paranoid schizophrenia: Secondary | ICD-10-CM | POA: Diagnosis not present

## 2018-12-06 MED ORDER — FLUCONAZOLE 150 MG PO TABS: 150.0000 mg | ORAL_TABLET | Freq: Once | ORAL | 0 refills | Status: AC

## 2018-12-06 MED ORDER — METRONIDAZOLE 500 MG PO TABS: 500.0000 mg | ORAL_TABLET | Freq: Two times a day (BID) | ORAL | 0 refills | Status: DC

## 2018-12-06 NOTE — Addendum Note (Signed)
Addended by: Tarry Kos on: 12/06/2018 04:16 PM   Modules accepted: Orders

## 2018-12-10 ENCOUNTER — Other Ambulatory Visit: Payer: Self-pay

## 2018-12-12 ENCOUNTER — Ambulatory Visit: Payer: Medicare Other | Admitting: Internal Medicine

## 2018-12-12 DIAGNOSIS — F419 Anxiety disorder, unspecified: Secondary | ICD-10-CM | POA: Diagnosis not present

## 2018-12-12 DIAGNOSIS — I10 Essential (primary) hypertension: Secondary | ICD-10-CM | POA: Diagnosis not present

## 2018-12-12 DIAGNOSIS — E785 Hyperlipidemia, unspecified: Secondary | ICD-10-CM | POA: Diagnosis not present

## 2018-12-12 DIAGNOSIS — F341 Dysthymic disorder: Secondary | ICD-10-CM | POA: Diagnosis not present

## 2018-12-12 DIAGNOSIS — R002 Palpitations: Secondary | ICD-10-CM | POA: Diagnosis not present

## 2018-12-12 DIAGNOSIS — F319 Bipolar disorder, unspecified: Secondary | ICD-10-CM | POA: Diagnosis not present

## 2018-12-12 DIAGNOSIS — E119 Type 2 diabetes mellitus without complications: Secondary | ICD-10-CM | POA: Diagnosis not present

## 2018-12-12 DIAGNOSIS — M1990 Unspecified osteoarthritis, unspecified site: Secondary | ICD-10-CM | POA: Diagnosis not present

## 2018-12-13 ENCOUNTER — Ambulatory Visit: Payer: Medicare Other | Admitting: Nurse Practitioner

## 2018-12-13 ENCOUNTER — Other Ambulatory Visit: Payer: Self-pay | Admitting: Medical

## 2018-12-14 ENCOUNTER — Encounter: Payer: Medicare Other | Admitting: Internal Medicine

## 2018-12-14 ENCOUNTER — Other Ambulatory Visit: Payer: Self-pay

## 2018-12-14 NOTE — Progress Notes (Signed)
Name: Cheyenne Alvarez  Age/ Sex: 53 y.o., female   MRN/ DOB: [id number], [birth year]     PCP: Carlena Hurl, PA-C   Reason for Endocrinology Evaluation: Type 2 Diabetes Mellitus  Initial Endocrine Consultative Visit: 03/08/2016    PATIENT IDENTIFIER: Cheyenne Alvarez is a 52 y.o. female with a past medical history of T2DM, HTN, Bipolar d/o, Schizophrenia . The patient has followed with Endocrinology clinic since 03/08/2016 for consultative assistance with management of her diabetes.  DIABETIC HISTORY:  Cheyenne Alvarez was diagnosed with T2DM in 2010,  she has been on Metformin since diagnosis, and started insulin therapy in 2015. Her hemoglobin A1c has ranged from 6.3 % in 2011 peaking at 13.0%  in 2020   Pt was lost to follow up from 2018 and reestablished in 06/2018.  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 She lives alone but her 3 daughters and a son who help.   Previous hx with non-compliance.   SUBJECTIVE:   During the last visit (09/19/2018): We increased Novolog Mix and continued Metformin    Today (12/14/2018): [name] is here for a 6 week follow up on diabetes management.  She checks her blood sugars 2 times daily, preprandial.The patient has not had hypoglycemic episodes since the last clinic visit. Otherwise, the patient has not required any recent emergency interventions for hypoglycemia and has not had recent hospitalizations secondary to hyper or hypoglycemic episodes.   She continues to forget to take her insulin at times. But she denies any snacking between meals and avoids sugar-sweetened beverages.       ROS: As per HPI and as detailed below: Review of Systems  Constitutional: Negative for fever.  HENT: Negative for congestion and sore throat.   Respiratory: Negative for cough and shortness of breath.   Cardiovascular: Negative for chest pain and palpitations.  Gastrointestinal: Negative for  diarrhea and nausea.      HOME DIABETES REGIMEN:  Novolog Mix 24 units BID  Metformin 500 mg BID    METER DOWNLOAD SUMMARY: Did not bring     HISTORY:  Past Medical History:  Past Medical History:  Diagnosis Date  . Alopecia   . Anemia   . Anxiety   . Bipolar 1 disorder ([place])   . COPD (chronic obstructive pulmonary disease) ([place])   . Coronary artery disease   . Depression   . Diabetes mellitus 2010  . Gastritis   . GERD (gastroesophageal reflux disease)   . Headache    migraines  . Hypertension 2010  . Obesity   . Pneumonia   . PONV (postoperative nausea and vomiting)   . PTSD (post-traumatic stress disorder)   . Schizophrenia ([place])   . Tobacco use    Past Surgical History:  Past Surgical History:  Procedure Laterality Date  . ABDOMINAL HYSTERECTOMY    . CARDIAC CATHETERIZATION    . CHOLECYSTECTOMY    . COLONOSCOPY  05/14/14   hemorrhoids, otherwise normal; Dr. Wilford Corner  . COLONOSCOPY WITH PROPOFOL N/A 05/14/2014   Procedure: COLONOSCOPY WITH PROPOFOL;  Surgeon: Wilford Corner, MD;  Location: [facility] ENDOSCOPY;  Service: Endoscopy;  Laterality: N/A;  . ESOPHAGOGASTRODUODENOSCOPY (EGD) WITH PROPOFOL N/A 05/14/2014   Procedure: ESOPHAGOGASTRODUODENOSCOPY (EGD) WITH PROPOFOL;  Surgeon: Wilford Corner, MD;  Location: [facility] ENDOSCOPY;  Service: Endoscopy;  Laterality: N/A;  . FRACTURE SURGERY     left leg  . LEG SURGERY    . TUBAL  LIGATION      Social History:  reports that she quit smoking about 4 years ago. She has never used smokeless tobacco. She reports that she does not drink alcohol or use drugs. Family History:  Family History  Problem Relation Age of Onset  . Diabetes Mother   . Kidney failure Mother   . Diabetes Other   . Cancer Other   . Hypertension Other   . Breast cancer Sister [id number]  . Breast cancer Maternal Grandmother      HOME MEDICATIONS: Allergies as of 12/14/2018      Reactions   Dexilant [dexlansoprazole] Shortness Of Breath,  Diarrhea, Nausea And Vomiting, Other (See Comments)   Chest pain and abdominal pain (also)   Famotidine Anaphylaxis   Meperidine Hcl Anaphylaxis   Dilaudid [hydromorphone Hcl] Other (See Comments)   Change in mental state   Gabapentin Other (See Comments)   Memory loss   Hydrocodone Nausea And Vomiting   Ramipril Swelling, Other (See Comments)   Angioedema   Ziprasidone Hcl Other (See Comments)   Caused convulsions   Invokana [canagliflozin] Rash   Iohexol Itching, Rash   Tape Rash   Use paper tape only       Medication List       Accurate as of December 14, 2018 11:46 AM. If you have any questions, ask your nurse or doctor.        ALPRAZolam 1 MG tablet Commonly known as: XANAX Take 1 mg by mouth 3 (three) times daily. FOR ANXIETY   amantadine 100 MG capsule Commonly known as: SYMMETREL Take 100 mg by mouth 2 (two) times daily. FOR TREMORS   amLODipine 10 MG tablet Commonly known as: NORVASC TAKE 1 TABLET(10 MG) BY MOUTH DAILY   atenolol 50 MG tablet Commonly known as: TENORMIN Take 1 tablet (50 mg total) by mouth 2 (two) times daily.   Austedo 6 MG Tabs Generic drug: Deutetrabenazine Take 6 mg by mouth 2 (two) times daily.   benztropine 2 MG tablet Commonly known as: COGENTIN Take 2 mg by mouth 2 (two) times daily. FOR TREMORS AND MOUTH MOVEMENTS   busPIRone 15 MG tablet Commonly known as: BUSPAR Take 15 mg by mouth 2 (two) times daily. Reported on 04/20/2015   cholecalciferol 25 MCG (1000 UT) tablet Commonly known as: VITAMIN D3 Take 1 tablet (1,000 Units total) by mouth daily.   diphenhydrAMINE 25 mg capsule Commonly known as: BENADRYL Take 50 mg by mouth at bedtime. Reported on 04/07/2015   Fanapt 6 MG Tabs Generic drug: Iloperidone   FLUoxetine 40 MG capsule Commonly known as: PROZAC Take 40 mg by mouth daily.   Fluticasone-Salmeterol 250-50 MCG/DOSE Aepb Commonly known as: Advair Diskus Inhale 1 puff into the lungs 2 (two) times daily.    fluvoxaMINE 25 MG tablet Commonly known as: LUVOX Take 25 mg by mouth 2 (two) times daily.   glucose blood test strip 1 each by Other route 3 (three) times daily. Use as instructed   Insulin Pen Needle 32G X 4 MM Misc Commonly known as: NovoFine Plus USE TWICE DAILY   Invega Trinza 819 MG/2.625ML Susp Generic drug: Paliperidone Palmitate   losartan-hydrochlorothiazide 100-25 MG tablet Commonly known as: HYZAAR Take 1 tablet by mouth daily.   loxapine 25 MG capsule Commonly known as: LOXITANE TAKE 1 TO 2 CAPSULES BY MOUTH   metFORMIN 500 MG 24 hr tablet Commonly known as: GLUCOPHAGE-XR Take 2 tablets (1,000 mg total) by mouth daily with breakfast AND 1 tablet (  500 mg total) daily with supper.   metroNIDAZOLE 500 MG tablet Commonly known as: FLAGYL Take 1 tablet (500 mg total) by mouth 2 (two) times daily.   nitroGLYCERIN 0.4 MG SL tablet Commonly known as: NITROSTAT Place 0.4 mg under the tongue every 5 (five) minutes as needed for chest pain. Reported on 07/15/2015   NovoLOG Mix 70/30 FlexPen (70-30) 100 UNIT/ML FlexPen Generic drug: insulin aspart protamine - aspart Inject 0.44 mLs (44 Units total) into the skin 2 (two) times daily with a meal.   nystatin-triamcinolone ointment Commonly known as: MYCOLOG Apply 1 application topically 2 (two) times daily.   pantoprazole 40 MG tablet Commonly known as: PROTONIX TAKE 1 TABLET BY MOUTH TWICE DAILY   rosuvastatin 20 MG tablet Commonly known as: Crestor Take 1 tablet (20 mg total) by mouth daily.   terconazole 0.8 % vaginal cream Commonly known as: TERAZOL 3 Place 1 applicator vaginally at bedtime.   Ventolin HFA 108 (90 Base) MCG/ACT inhaler Generic drug: albuterol INHALE 2 PUFFS BY MOUTH EVERY 6 HOURS AS NEEDED FOR WHEEZING   Vraylar capsule Generic drug: cariprazine TK 1 C PO HS   zolpidem 10 MG tablet Commonly known as: AMBIEN Take 1 tablet by mouth at bedtime.        OBJECTIVE:   Vital Signs:  There were no vitals taken for this visit.  Wt Readings from Last 3 Encounters:  11/28/18 226 lb (102.5 kg)  11/06/18 229 lb 12.8 oz (104.2 kg)  09/19/18 225 lb (102.1 kg)     Exam: General: Pt appears well and is in NAD  Neck: General: Supple without adenopathy. Thyroid: Thyroid size normal.  No goiter or nodules appreciated. No thyroid bruit.  Lungs: Clear with good BS bilat with no rales, rhonchi, or wheezes  Heart: RRR with normal S1 and S2 and no gallops; no murmurs; no rub  Abdomen: Normoactive bowel sounds, soft, nontender, without masses or organomegaly palpable  Extremities: No pretibial edema. No tremor.  Skin: Normal texture and temperature to palpation. No rash noted.Some lipohypertrophy noted at the lower abdominal wall   Neuro: MS is good with appropriate affect, pt is alert and Ox3    DM foot exam: 07/11/2018 The skin of the feet is intact without sores or ulcerations.pt with plantar callous formation.  The pedal pulses are 2+ on right and 2+ on left. The sensation is intact to a screening 5.07, 10 gram monofilament bilaterally     DATA REVIEWED:  Lab Results  Component Value Date   HGBA1C 12.6 (A) 09/19/2018   HGBA1C 13.0 (H) 07/03/2018   HGBA1C 11.7 (H) 03/13/2018   Lab Results  Component Value Date   MICROALBUR 0.9 07/01/2016   LDLCALC 136 (H) 11/06/2018   CREATININE 0.86 11/06/2018   Lab Results  Component Value Date   MICRALBCREAT 5 07/01/2016     Lab Results  Component Value Date   CHOL 213 (H) 11/06/2018   HDL 59 11/06/2018   LDLCALC 136 (H) 11/06/2018   TRIG 101 11/06/2018   CHOLHDL 3.6 11/06/2018       In-office BG was 333  mg/dL.    ASSESSMENT / PLAN / RECOMMENDATIONS:   1) Type 2 Diabetes Mellitus, Poorly controlled, Without complications - Most recent A1c of 12.6 %. Goal A1c < 7.0 %.   Plan: - She continues with hyperglycemia, today her BG in the office was 333 mg/dL , despite taking 34 units of Novolog Mix with Breakfast, we  will increase dosing as below -  She was also advised to avoid injecting at the lower abdominal wall, and to use the upper or the sides of her thighs.  - Pt encouraged to contact our office if Bg's remain above 200 mg/dL.  - She had declined a CDE referral in the past  - We again discussed the importance of glucose checks and having this data available to me.    MEDICATIONS:  Increase Novolog Mix to 44 units BID with meals (breakfast and Supper)  Increase Metformin 500 mg XR , 2 Tabs with breakfast and 1 tab with supper  EDUCATION / INSTRUCTIONS:  BG monitoring instructions: Patient is instructed to check her blood sugars 2 times a day, breakfast and supper.  Call [place] [facility] clinic if: BG persistently < 70 or > 300. . I reviewed the Rule of 15 for the treatment of hypoglycemia in detail with the patient. Literature supplied.     F/U in 6 weeks    Signed electronically by: Lyndle Herrlich, MD  [facility] Endocr[facility]ology  [facility] [address] [place], [place] [address] Phone: [phone] FAX: [phone]   CC: Jac Canavan, PA-C [address] [place] [address] Phone: [phone]  Fax: [phone]  Return to Endocrinology clinic as below: Future Appointments  Date Time Provider Department Center  12/14/2018  1:40 PM Shamleffer, Konrad Dolores, MD [facility] None

## 2018-12-18 DIAGNOSIS — K439 Ventral hernia without obstruction or gangrene: Secondary | ICD-10-CM | POA: Diagnosis not present

## 2018-12-24 ENCOUNTER — Other Ambulatory Visit: Payer: Self-pay | Admitting: General Surgery

## 2018-12-24 DIAGNOSIS — K439 Ventral hernia without obstruction or gangrene: Secondary | ICD-10-CM

## 2018-12-28 ENCOUNTER — Ambulatory Visit
Admission: RE | Admit: 2018-12-28 | Discharge: 2018-12-28 | Disposition: A | Discharge status: Home / Self Care | Payer: Medicare Other | Source: Ambulatory Visit | Attending: General Surgery | Admitting: General Surgery

## 2018-12-28 DIAGNOSIS — K439 Ventral hernia without obstruction or gangrene: Secondary | ICD-10-CM | POA: Diagnosis not present

## 2018-12-28 DIAGNOSIS — R109 Unspecified abdominal pain: Secondary | ICD-10-CM | POA: Diagnosis not present

## 2019-01-04 ENCOUNTER — Telehealth: Payer: Self-pay | Admitting: Medical

## 2019-01-04 NOTE — Telephone Encounter (Signed)
Pt uses [facility] [address]

## 2019-01-04 NOTE — Telephone Encounter (Signed)
pls call out, #100 with 11 refills

## 2019-01-04 NOTE — Telephone Encounter (Signed)
Pt left voicemail saying she needs a new prescription for her glucose test strips. Tried to call pt to confirm pharmacy but could not get an answer

## 2019-01-07 MED ORDER — GLUCOSE BLOOD VI STRP: 1.0000 | ORAL_STRIP | Freq: Three times a day (TID) | 11 refills | Status: DC

## 2019-01-07 NOTE — Telephone Encounter (Signed)
Done

## 2019-01-22 ENCOUNTER — Telehealth: Payer: Self-pay | Admitting: *Deleted

## 2019-01-22 NOTE — Telephone Encounter (Signed)
Pt called to office stating she was having same problem as before, ask for return call.  Return call to pt.  Pt states she was not able to get Rx's that were sent previously as they were not covered.  Pt states she would like follow up appt because she is now "raw down there".   Pt was scheduled for next week for f/u yeast/vaginal irritation. Pt was advised to continue regimen that she has been using until her appt.time.   Pt states understanding.

## 2019-01-25 ENCOUNTER — Other Ambulatory Visit: Payer: Self-pay | Admitting: Medical

## 2019-01-29 ENCOUNTER — Ambulatory Visit (INDEPENDENT_AMBULATORY_CARE_PROVIDER_SITE_OTHER): Payer: Medicare Other | Admitting: Obstetrics and Gynecology

## 2019-01-29 ENCOUNTER — Encounter: Payer: Self-pay | Admitting: Obstetrics and Gynecology

## 2019-01-29 ENCOUNTER — Other Ambulatory Visit: Payer: Self-pay

## 2019-01-29 ENCOUNTER — Other Ambulatory Visit (HOSPITAL_COMMUNITY)
Admission: RE | Admit: 2019-01-29 | Discharge: 2019-01-29 | Disposition: A | Discharge status: Home / Self Care | Payer: Medicare Other | Source: Ambulatory Visit | Attending: Obstetrics and Gynecology | Admitting: Obstetrics and Gynecology

## 2019-01-29 VITALS — BP 159/92 | HR 94 | Wt 222.2 lb

## 2019-01-29 DIAGNOSIS — N898 Other specified noninflammatory disorders of vagina: Secondary | ICD-10-CM

## 2019-01-29 DIAGNOSIS — L292 Pruritus vulvae: Secondary | ICD-10-CM

## 2019-01-29 DIAGNOSIS — B373 Candidiasis of vulva and vagina: Secondary | ICD-10-CM

## 2019-01-29 DIAGNOSIS — B3731 Acute candidiasis of vulva and vagina: Secondary | ICD-10-CM

## 2019-01-29 MED ORDER — FLUCONAZOLE 150 MG PO TABS: 150.0000 mg | ORAL_TABLET | ORAL | 0 refills | Status: AC

## 2019-01-29 MED ORDER — NYSTATIN-TRIAMCINOLONE 100000-0.1 UNIT/GM-% EX OINT: 1.0000 "application " | TOPICAL_OINTMENT | Freq: Two times a day (BID) | CUTANEOUS | 0 refills | Status: DC

## 2019-01-29 NOTE — Patient Instructions (Signed)
Alternative Vaginitis Therapies  1) soak in tub of warm water waist high with 1/2 cup of baking soda in water for ~ 20 mins. 2) soak 3 tampons in 1 tablespoon of fractionated (liquid form) coconut oil with 10 drops of Melaleuca (Tea Tree) essential oil, insert 1 saturated tampon vaginally at bedtime x 3 days. Both options are to be done after sexual intercourse, menses and when suspects BV and/or yeast infection. Advised that these alternatives will not replace the need to be evaluated, if sx's persist. You will need to seek care at an OB/GYN provider.  GO WHITE: Soap: UNSCENTED Dove (white box light green writing) Laundry detergent (underwear)- Dreft or Arm n' Hammer unscented WHITE 100% cotton panties (NOT just cotton crouch) Sanitary napkin/panty liners: UNSCENTED.  If it doesn't SAY unscented it can have a scent/perfume    NO PERFUMES OR LOTIONS OR POTIONS in the vulvar area (may use regular [place]) Condoms: hypoallergenic only. Non dyed (no color) Toilet papers: white only Wash clothes: use a separate wash cloth. WHITE.  Wash in [place].   You can purchase Tea Tree Oil locally at:  [facility] [address], [place] [address] [phone]   [facility] [address] [place], [place] [address] [phone]     Vaginal Yeast infection, Adult  Vaginal yeast infection is a condition that causes vaginal discharge as well as soreness, swelling, and redness (inflammation) of the vagina. This is a common condition. Some women get this infection frequently. What are the causes? This condition is caused by a change in the normal balance of the yeast (candida) and bacteria that live in the vagina. This change causes an overgrowth of yeast, which causes the inflammation. What increases the risk? The condition is more likely to develop in women who:  Take antibiotic medicines.  Have diabetes.  Take birth control pills.  Are pregnant.  Douche often.   Have a weak body defense system (immune system).  Have been taking steroid medicines for a long time.  Frequently wear tight clothing. What are the signs or symptoms? Symptoms of this condition include:  White, thick, creamy vaginal discharge.  Swelling, itching, redness, and irritation of the vagina. The lips of the vagina (vulva) may be affected as well.  Pain or a burning feeling while urinating.  Pain during sex. How is this diagnosed? This condition is diagnosed based on:  Your medical history.  A physical exam.  A pelvic exam. Your health care provider will examine a sample of your vaginal discharge under a microscope. Your health care provider may send this sample for testing to confirm the diagnosis. How is this treated? This condition is treated with medicine. Medicines may be over-the-counter or prescription. You may be told to use one or more of the following:  Medicine that is taken by mouth (orally).  Medicine that is applied as a cream (topically).  Medicine that is inserted directly into the vagina (suppository). Follow these instructions at home:  Lifestyle  Do not have sex until your health care provider approves. Tell your sex partner that you have a yeast infection. That person should go to his or her health care provider and ask if they should also be treated.  Do not wear tight clothes, such as pantyhose or tight pants.  Wear breathable cotton underwear. General instructions  Take or apply over-the-counter and prescription medicines only as told by your health care provider.  Eat more yogurt. This may help to keep your yeast infection  from returning.  Do not use tampons until your health care provider approves.  Try taking a sitz bath to help with discomfort. This is a warm water bath that is taken while you are sitting down. The water should only come up to your hips and should cover your buttocks. Do this 3-4 times per day or as told by your  health care provider.  Do not douche.  If you have diabetes, keep your blood sugar levels under control.  Keep all follow-up visits as told by your health care provider. This is important. Contact a health care provider if:  You have a fever.  Your symptoms go away and then return.  Your symptoms do not get better with treatment.  Your symptoms get worse.  You have new symptoms.  You develop blisters in or around your vagina.  You have blood coming from your vagina and it is not your menstrual period.  You develop pain in your abdomen. Summary  Vaginal yeast infection is a condition that causes discharge as well as soreness, swelling, and redness (inflammation) of the vagina.  This condition is treated with medicine. Medicines may be over-the-counter or prescription.  Take or apply over-the-counter and prescription medicines only as told by your health care provider.  Do not douche. Do not have sex or use tampons until your health care provider approves.  Contact a health care provider if your symptoms do not get better with treatment or your symptoms go away and then return. This information is not intended to replace advice given to you by your health care provider. Make sure you discuss any questions you have with your health care provider. Document Released: 11/24/2004 Document Revised: 07/03/2017 Document Reviewed: 07/03/2017 Elsevier Patient Education  2020 [employer].

## 2019-01-29 NOTE — Progress Notes (Signed)
Pt reports she has been dealing with vaginal itching and burning for 3 months now.

## 2019-01-29 NOTE — Progress Notes (Signed)
  GYNECOLOGY PROGRESS NOTE  History:  [name] is a 53 y.o. [id number] presents to [facility] office today for problem gyn visit. She reports "severe vaginal irritation for 3 months". She states she is "raw down there and can't get the yeast infection healed up." She is not sexually active; last SI was 3 years ago.  She denies h/a, dizziness, shortness of breath, n/v, or fever/chills. She is diabetic with high blood pressure. She reports her blood sugar today was 220. She is on 45 units of insulin (unknown name) and Metformin 500 mg daily. She takes her medications, "eats right and walks" everyday, but still can't manage to keep blood sugars down. She does admit to "eating all of the things at Thanksgiving."  The following portions of the patient's history were reviewed and updated as appropriate: allergies, current medications, past family history, past medical history, past social history, past surgical history and problem list.  Review of Systems:  Pertinent items are noted in HPI.   Objective:  Physical Exam Blood pressure (!) 159/92, pulse 94, weight 222 lb 3.2 oz (100.8 kg). VS reviewed, nursing note reviewed,  Constitutional: well developed, well nourished, no distress HEENT: normocephalic CV: normal rate Pulm/chest wall: normal effort Breast Exam: deferred Abdomen: soft Neuro: alert and oriented x 3 Skin: warm, dry Psych: affect normal Pelvic exam: External genitalia erythematous, edematous, tender to touch, wet prep obtained with blind swab techinique  Assessment & Plan:  1. Vaginal itching - Cervicovaginal ancillary only( [place]) - Rx for fluconazole (DIFLUCAN) 150 MG tablet 3 days regimen  2. Candida vaginitis  - Discussed at length about abnormal blood sugars causing recurrent yeast infections - Advised to call endocrinologist to get seen before next appointment in January 2021 - Advised to increase exercise as much as she can to help lose weight; which  can help decrease blood sugar levels as well - Patient assures CNM that she will "do better and contact endocrinologist to be seen sooner - Rx fluconazole (DIFLUCAN) 150 MG tablet as above - Recommended Alternative Vaginitis Therapies 1) soak in tub of warm water waist high with 1/2 cup of baking soda in water for ~ 20 mins. 2) soak 3 tampons in 1 tablespoon of fractionated (liquid form) coconut oil with 10 drops of Melaleuca (Tea Tree) essential oil, insert 1 saturated tampon vaginally at bedtime x 3 days. Both options are to be done after sexual intercourse, menses and when suspects BV and/or yeast infection. Advised that these alternatives will not replace the need to be evaluated, if sx's persist. You will need to seek care at an OB/GYN provider.  GO WHITE: Soap: UNSCENTED Dove (white box light green writing) Laundry detergent (underwear)- Dreft or Arm n' Hammer unscented WHITE 100% cotton panties (NOT just cotton crouch) Sanitary napkin/panty liners: UNSCENTED.  If it doesn't SAY unscented it can have a scent/perfume    NO PERFUMES OR LOTIONS OR POTIONS in the vulvar area (may use regular [place]) Condoms: hypoallergenic only. Non dyed (no color) Toilet papers: white only Wash clothes: use a separate wash cloth. WHITE.  Wash in [place].   3. Vulvar itching  - Rx reordered for nystatin-triamcinolone ointment (MYCOLOG)   Laury Deep, CNM 01/29/2019 2:13 PM

## 2019-01-30 LAB — CERVICOVAGINAL ANCILLARY ONLY
Bacterial Vaginitis (gardnerella): NEGATIVE
Candida Glabrata: POSITIVE — AB
Candida Vaginitis: POSITIVE — AB
Comment: NEGATIVE
Comment: NEGATIVE
Comment: NEGATIVE

## 2019-02-24 ENCOUNTER — Other Ambulatory Visit: Payer: Self-pay | Admitting: Medical

## 2019-03-06 DIAGNOSIS — F250 Schizoaffective disorder, bipolar type: Secondary | ICD-10-CM | POA: Diagnosis not present

## 2019-03-12 ENCOUNTER — Other Ambulatory Visit: Payer: Self-pay | Admitting: Medical

## 2019-03-17 ENCOUNTER — Other Ambulatory Visit: Payer: Self-pay | Admitting: Internal Medicine

## 2019-03-18 ENCOUNTER — Other Ambulatory Visit: Payer: Self-pay | Admitting: Internal Medicine

## 2019-03-28 ENCOUNTER — Telehealth: Payer: Self-pay | Admitting: Medical

## 2019-03-28 NOTE — Telephone Encounter (Signed)
Spke to pharmacy and the issue was the medication is out of stock until tomorrow. Patient stated she has another pen and she will pick up medication from the pharmacy tomorrow.

## 2019-03-28 NOTE — Telephone Encounter (Signed)
Pt called for refills of insulin. She is unsure of name but thinks Novalog. Pt states that she call the pharmacy, [facility] on [address] and talk to a person and they state she has not more refills. Pt can be reached at [phone].

## 2019-04-01 DIAGNOSIS — M6208 Separation of muscle (nontraumatic), other site: Secondary | ICD-10-CM | POA: Diagnosis not present

## 2019-04-01 DIAGNOSIS — K439 Ventral hernia without obstruction or gangrene: Secondary | ICD-10-CM | POA: Diagnosis not present

## 2019-04-03 ENCOUNTER — Other Ambulatory Visit: Payer: Self-pay | Admitting: Medical

## 2019-04-18 ENCOUNTER — Other Ambulatory Visit: Payer: Self-pay | Admitting: Internal Medicine

## 2019-04-19 DIAGNOSIS — R002 Palpitations: Secondary | ICD-10-CM | POA: Diagnosis not present

## 2019-04-19 DIAGNOSIS — E119 Type 2 diabetes mellitus without complications: Secondary | ICD-10-CM | POA: Diagnosis not present

## 2019-04-19 DIAGNOSIS — F419 Anxiety disorder, unspecified: Secondary | ICD-10-CM | POA: Diagnosis not present

## 2019-04-19 DIAGNOSIS — E785 Hyperlipidemia, unspecified: Secondary | ICD-10-CM | POA: Diagnosis not present

## 2019-04-19 DIAGNOSIS — M1990 Unspecified osteoarthritis, unspecified site: Secondary | ICD-10-CM | POA: Diagnosis not present

## 2019-04-19 DIAGNOSIS — I10 Essential (primary) hypertension: Secondary | ICD-10-CM | POA: Diagnosis not present

## 2019-04-19 DIAGNOSIS — F341 Dysthymic disorder: Secondary | ICD-10-CM | POA: Diagnosis not present

## 2019-04-20 LAB — LAB REPORT - SCANNED
A1c: 13.4
EGFR (African American): 75

## 2019-04-22 ENCOUNTER — Other Ambulatory Visit: Payer: Self-pay | Admitting: Medical

## 2019-05-03 DIAGNOSIS — F411 Generalized anxiety disorder: Secondary | ICD-10-CM | POA: Diagnosis not present

## 2019-05-03 DIAGNOSIS — Z79899 Other long term (current) drug therapy: Secondary | ICD-10-CM | POA: Diagnosis not present

## 2019-05-07 ENCOUNTER — Other Ambulatory Visit: Payer: Self-pay | Admitting: Medical

## 2019-05-08 ENCOUNTER — Other Ambulatory Visit: Payer: Self-pay | Admitting: Medical

## 2019-05-09 ENCOUNTER — Ambulatory Visit: Payer: Medicare Other | Attending: Family

## 2019-05-09 DIAGNOSIS — Z23 Encounter for immunization: Secondary | ICD-10-CM

## 2019-05-09 NOTE — Progress Notes (Signed)
   Covid-19 Vaccination Clinic  Name:  Cheyenne Alvarez    MRN: [id number] DOB: 04-18-1965  05/09/2019  [name] was observed post Covid-19 immunization for 15 minutes without incident. She was provided with Vaccine Information Sheet and instruction to access the V-Safe system.   [name] was instructed to call 911 with any severe reactions post vaccine: Marland Kitchen Difficulty breathing  . Swelling of face and throat  . A fast heartbeat  . A bad rash all over body  . Dizziness and weakness   Immunizations Administered    Name Date Dose VIS Date Route   Moderna COVID-19 Vaccine 05/09/2019 10:09 AM 0.5 mL 01/29/2019 Intramuscular   Manufacturer: Moderna   Lot: [id number]   NDC: 34742-595-63

## 2019-05-25 ENCOUNTER — Emergency Department (HOSPITAL_COMMUNITY)
Admission: EM | Admit: 2019-05-25 | Discharge: 2019-05-26 | Disposition: A | Discharge status: Home / Self Care | Payer: Medicare Other | Attending: Emergency Medicine | Admitting: Emergency Medicine

## 2019-05-25 DIAGNOSIS — Z79899 Other long term (current) drug therapy: Secondary | ICD-10-CM | POA: Diagnosis not present

## 2019-05-25 DIAGNOSIS — Z794 Long term (current) use of insulin: Secondary | ICD-10-CM | POA: Insufficient documentation

## 2019-05-25 DIAGNOSIS — R739 Hyperglycemia, unspecified: Secondary | ICD-10-CM

## 2019-05-25 DIAGNOSIS — N179 Acute kidney failure, unspecified: Secondary | ICD-10-CM

## 2019-05-25 DIAGNOSIS — E1165 Type 2 diabetes mellitus with hyperglycemia: Secondary | ICD-10-CM | POA: Diagnosis not present

## 2019-05-25 DIAGNOSIS — I119 Hypertensive heart disease without heart failure: Secondary | ICD-10-CM | POA: Insufficient documentation

## 2019-05-25 DIAGNOSIS — I2510 Atherosclerotic heart disease of native coronary artery without angina pectoris: Secondary | ICD-10-CM | POA: Diagnosis not present

## 2019-05-25 LAB — CBG MONITORING, ED: Glucose-Capillary: 477 mg/dL — ABNORMAL HIGH (ref 70–99)

## 2019-05-26 ENCOUNTER — Other Ambulatory Visit: Payer: Self-pay

## 2019-05-26 ENCOUNTER — Encounter (HOSPITAL_COMMUNITY): Payer: Self-pay

## 2019-05-26 DIAGNOSIS — E1165 Type 2 diabetes mellitus with hyperglycemia: Secondary | ICD-10-CM | POA: Diagnosis not present

## 2019-05-26 LAB — CBC
HCT: 35.6 % — ABNORMAL LOW (ref 36.0–46.0)
Hemoglobin: 11.6 g/dL — ABNORMAL LOW (ref 12.0–15.0)
MCH: 25.7 pg — ABNORMAL LOW (ref 26.0–34.0)
MCHC: 32.6 g/dL (ref 30.0–36.0)
MCV: 78.9 fL — ABNORMAL LOW (ref 80.0–100.0)
Platelets: 412 10*3/uL — ABNORMAL HIGH (ref 150–400)
RBC: 4.51 MIL/uL (ref 3.87–5.11)
RDW: 15.1 % (ref 11.5–15.5)
WBC: 11.9 10*3/uL — ABNORMAL HIGH (ref 4.0–10.5)
nRBC: 0 % (ref 0.0–0.2)

## 2019-05-26 LAB — URINALYSIS, ROUTINE W REFLEX MICROSCOPIC
Bilirubin Urine: NEGATIVE
Glucose, UA: >=500 mg/dL — AB
Ketones, ur: NEGATIVE mg/dL
Nitrite: NEGATIVE
Protein, ur: 30 mg/dL — AB
Specific Gravity, Urine: 1.022 (ref 1.005–1.030)
pH: 5 (ref 5.0–8.0)

## 2019-05-26 LAB — BASIC METABOLIC PANEL
Anion gap: 14 (ref 5–15)
BUN: 11 mg/dL (ref 6–20)
CO2: 23 mmol/L (ref 22–32)
Calcium: 9.4 mg/dL (ref 8.9–10.3)
Chloride: 92 mmol/L — ABNORMAL LOW (ref 98–111)
Creatinine, Ser: 1.36 mg/dL — ABNORMAL HIGH (ref 0.44–1.00)
GFR calc Af Amer: 51 mL/min — ABNORMAL LOW (ref >60)
GFR calc non Af Amer: 44 mL/min — ABNORMAL LOW (ref >60)
Glucose, Bld: 485 mg/dL — ABNORMAL HIGH (ref 70–99)
Potassium: 3.7 mmol/L (ref 3.5–5.1)
Sodium: 129 mmol/L — ABNORMAL LOW (ref 135–145)

## 2019-05-26 LAB — I-STAT BETA HCG BLOOD, ED (MC, WL, AP ONLY): I-stat hCG, quantitative: <5 m[IU]/mL (ref <5)

## 2019-05-26 LAB — CBG MONITORING, ED: Glucose-Capillary: 255 mg/dL — ABNORMAL HIGH (ref 70–99)

## 2019-05-26 MED ORDER — SODIUM CHLORIDE 0.9 % IV BOLUS (SEPSIS)
1000.0000 mL | Freq: Once | INTRAVENOUS | Status: AC
  Administered 2019-05-26: 02:00:00 1000 mL via INTRAVENOUS

## 2019-05-26 MED ORDER — INSULIN ASPART 100 UNIT/ML ~~LOC~~ SOLN
10.0000 [IU] | Freq: Once | SUBCUTANEOUS | Status: AC
  Administered 2019-05-26: 10 [IU] via INTRAVENOUS

## 2019-05-26 MED ORDER — SODIUM CHLORIDE 0.9 % IV BOLUS (SEPSIS)
1000.0000 mL | Freq: Once | INTRAVENOUS | Status: AC
  Administered 2019-05-26: 1000 mL via INTRAVENOUS

## 2019-05-26 NOTE — ED Notes (Signed)
Patient verbalizes understanding of discharge instructions. Opportunity for questioning and answers were provided. Armband removed by staff, pt discharged from ED. Pt. ambulatory and discharged home.  

## 2019-05-26 NOTE — Discharge Instructions (Signed)
Continue your medications as prescribed.  I recommend at this time you avoid aspirin, ibuprofen, Aleve, Goody powders given your slightly elevated kidney function which is likely due to dehydration from your elevated glucose today.  Please increase your water intake at home.  I recommend avoiding foods high in fat, sugar and carbohydrates.  I recommend increase exercise.  Please follow-up closely with your PCP and endocrinologist.

## 2019-05-26 NOTE — ED Triage Notes (Signed)
Pt arrives to ED w/ c/o hyperglycemia.

## 2019-05-26 NOTE — ED Provider Notes (Signed)
TIME SEEN: 1:29 AM  CHIEF COMPLAINT: Hyperglycemia  HPI: Patient is a 54 year old female with history of insulin-dependent diabetes who presents to the emergency department with blood sugars as high as 511 today.  She reports over the past several days she has had polyuria, polydipsia and lightheadedness.  No history of DKA.  Reports she is on pills and insulin.  Reports compliance with her medications and denies any recent medication changes.  She is followed by a PCP as well as an endocrinologist.  Reports that her sugars on a good day normally run in the 200s.  Her daughter at bedside reports that the patient still drinks soda and has been eating candy recently.  ROS: See HPI Constitutional: no fever  Eyes: no drainage  ENT: no runny nose   Cardiovascular:  no chest pain  Resp: no SOB  GI: no vomiting GU: no dysuria Integumentary: no rash  Allergy: no hives  Musculoskeletal: no leg swelling  Neurological: no slurred speech ROS otherwise negative  PAST MEDICAL HISTORY/PAST SURGICAL HISTORY:  Past Medical History:  Diagnosis Date  . Alopecia   . Anemia   . Anxiety   . Bipolar 1 disorder (HCC)   . COPD (chronic obstructive pulmonary disease) (HCC)   . Coronary artery disease   . Depression   . Diabetes mellitus 2010  . Gastritis   . GERD (gastroesophageal reflux disease)   . Headache    migraines  . Hypertension 2010  . Obesity   . Pneumonia   . PONV (postoperative nausea and vomiting)   . PTSD (post-traumatic stress disorder)   . Schizophrenia (HCC)   . Tobacco use     MEDICATIONS:  Prior to Admission medications   Medication Sig Start Date End Date Taking? Authorizing Provider  albuterol (VENTOLIN HFA) 108 (90 Base) MCG/ACT inhaler INHALE 2 PUFFS INTO THE LUNGS EVERY 6 HOURS AS NEEDED FOR WHEEZING 05/08/19   Tysinger, Kermit Balo, PA-C  ALPRAZolam Prudy Feeler) 1 MG tablet Take 1 mg by mouth 3 (three) times daily. FOR ANXIETY 09/26/15   [provider]  amantadine  (SYMMETREL) 100 MG capsule Take 100 mg by mouth 2 (two) times daily. FOR TREMORS 10/21/16   [provider]  amLODipine (NORVASC) 10 MG tablet TAKE 1 TABLET(10 MG) BY MOUTH DAILY 05/07/19   Tysinger, Kermit Balo, PA-C  atenolol (TENORMIN) 50 MG tablet Take 1 tablet (50 mg total) by mouth 2 (two) times daily. 07/04/18   Tysinger, Kermit Balo, PA-C  AUSTEDO 6 MG TABS Take 6 mg by mouth 2 (two) times daily. 10/25/16   [provider]  benztropine (COGENTIN) 2 MG tablet Take 2 mg by mouth 2 (two) times daily. FOR TREMORS AND MOUTH MOVEMENTS 10/24/16   [provider]  busPIRone (BUSPAR) 15 MG tablet Take 15 mg by mouth 2 (two) times daily. Reported on 04/20/2015    [provider]  cholecalciferol (VITAMIN D3) 25 MCG (1000 UT) tablet Take 1 tablet (1,000 Units total) by mouth daily. 11/13/18   Tysinger, Kermit Balo, PA-C  diphenhydrAMINE (BENADRYL) 25 mg capsule Take 50 mg by mouth at bedtime. Reported on 04/07/2015    [provider]  FANAPT 6 MG TABS  04/01/15   [provider]  FLUoxetine (PROZAC) 40 MG capsule Take 40 mg by mouth daily. 11/21/16   [provider]  Fluticasone-Salmeterol (ADVAIR DISKUS) 250-50 MCG/DOSE AEPB Inhale 1 puff into the lungs 2 (two) times daily. 07/04/18   Tysinger, Kermit Balo, PA-C  fluvoxaMINE (LUVOX) 25 MG  tablet Take 25 mg by mouth 2 (two) times daily.    [provider]  glucose blood test strip 1 each by Other route 3 (three) times daily. Use as instructed 01/07/19   Tysinger, Camelia Eng, PA-C  insulin aspart protamine - aspart (NOVOLOG MIX 70/30 FLEXPEN) (70-30) 100 UNIT/ML FlexPen Inject 0.44 mLs (44 Units total) into the skin 2 (two) times daily with a meal. 09/19/18   Shamleffer, Melanie Crazier, MD  Insulin Pen Needle (BD PEN NEEDLE NANO U/F) 32G X 4 MM MISC USE TWICE DAILY 04/23/19   Caryl Ada  [facility] TRINZA 819 MG/2.625ML SUSP  08/07/15   [provider]  losartan-hydrochlorothiazide (HYZAAR) 100-25 MG  tablet TAKE 1 TABLET BY MOUTH DAILY 03/12/19   Tysinger, Camelia Eng, PA-C  loxapine (LOXITANE) 25 MG capsule TAKE 1 TO 2 CAPSULES BY MOUTH 08/29/17   [provider]  metFORMIN (GLUCOPHAGE-XR) 500 MG 24 hr tablet TAKE 2 TABLETS BY MOUTH DAILY WITH BREAKFAST AND TAKE 1 TABLET DAILY WITH SUPPER 03/18/19   Shamleffer, Melanie Crazier, MD  metroNIDAZOLE (FLAGYL) 500 MG tablet Take 1 tablet (500 mg total) by mouth 2 (two) times daily. Patient not taking: Reported on 01/29/2019 12/06/18   Nugent, Gerrie Nordmann, NP  nitroGLYCERIN (NITROSTAT) 0.4 MG SL tablet Place 0.4 mg under the tongue every 5 (five) minutes as needed for chest pain. Reported on 07/15/2015    [provider]  nystatin-triamcinolone ointment (MYCOLOG) Apply 1 application topically 2 (two) times daily. 01/29/19   Laury Deep, CNM  pantoprazole (PROTONIX) 40 MG tablet TAKE 1 TABLET BY MOUTH TWICE DAILY 04/04/19   Tysinger, Camelia Eng, PA-C  rosuvastatin (CRESTOR) 20 MG tablet Take 1 tablet (20 mg total) by mouth daily. 11/13/18   Tysinger, Camelia Eng, PA-C  terconazole (TERAZOL 3) 0.8 % vaginal cream Place 1 applicator vaginally at bedtime. 11/28/18   Nugent, Gerrie Nordmann, NP  VRAYLAR capsule TK 1 C PO HS 09/06/17   [provider]  zolpidem (AMBIEN) 10 MG tablet Take 1 tablet by mouth at bedtime.  08/29/15   [provider]    ALLERGIES:  Allergies  Allergen Reactions  . Dexilant [Dexlansoprazole] Shortness Of Breath, Diarrhea, Nausea And Vomiting and Other (See Comments)    Chest pain and abdominal pain (also)  . Famotidine Anaphylaxis  . Meperidine Hcl Anaphylaxis  . Dilaudid [Hydromorphone Hcl] Other (See Comments)    Change in mental state  . Gabapentin Other (See Comments)    Memory loss  . Hydrocodone Nausea And Vomiting  . Ramipril Swelling and Other (See Comments)    Angioedema   . Ziprasidone Hcl Other (See Comments)    Caused convulsions  . Invokana [Canagliflozin] Rash  . Iohexol Itching and Rash  . Tape  Rash    Use paper tape only     SOCIAL HISTORY:  Social History   Tobacco Use  . Smoking status: Former Smoker    Quit date: 12/18/2013    Years since quitting: 5.4  . Smokeless tobacco: Never Used  Substance Use Topics  . Alcohol use: No    Alcohol/week: 0.0 standard drinks    FAMILY HISTORY: Family History  Problem Relation Age of Onset  . Diabetes Mother   . Kidney failure Mother   . Diabetes Other   . Cancer Other   . Hypertension Other   . Breast cancer Sister 39  . Breast cancer Maternal Grandmother     EXAM: BP (!) 147/66   Pulse 82  Temp 98.2 F (36.8 C) (Oral)   Resp 18   Ht 5\' 4"  (1.626 m)   Wt 99.8 kg   SpO2 100%   BMI 37.76 kg/m  CONSTITUTIONAL: Alert and oriented and responds appropriately to questions. Well-appearing; well-nourished, obese, in no distress HEAD: Normocephalic EYES: Conjunctivae clear, pupils appear equal, EOM appear intact ENT: normal nose; moist mucous membranes NECK: Supple, normal ROM CARD: RRR; S1 and S2 appreciated; no murmurs, no clicks, no rubs, no gallops RESP: Normal chest excursion without splinting or tachypnea; breath sounds clear and equal bilaterally; no wheezes, no rhonchi, no rales, no hypoxia or respiratory distress, speaking full sentences ABD/GI: Normal bowel sounds; non-distended; soft, non-tender, no rebound, no guarding, no peritoneal signs, no hepatosplenomegaly BACK:  The back appears normal EXT: Normal ROM in all joints; no deformity noted, no edema; no cyanosis SKIN: Normal color for age and race; warm; no rash on exposed skin NEURO: Moves all extremities equally PSYCH: [name] mood and manner are appropriate.   MEDICAL DECISION MAKING: Patient here with hyperglycemia.  Will obtain labs, urine to evaluate for DKA.  Will give IV fluids for symptomatic relief and to improve her blood sugar.  Was 477 on arrival.  Well-appearing currently.  Hemodynamically stable.  Discussed importance of diet changes,  medication compliance and close outpatient follow-up.  ED PROGRESS: Labs, urine do not show DKA.  Bicarb 23, gap 14 and no ketones in the urine.  Urine does show large leukocytes but rare bacteria no other sign of infection.  She is not complaining of infectious symptoms but will add on urine culture.  Will reassess blood glucose after IV fluids and IV insulin.   3:20 AM Pt's blood sugar has improved to 55 and she reports she is feeling better.  Labs do show slightly elevated creatinine of 1.36 likely secondary to dehydration from hyperglycemia.  Discussed this with patient I have encouraged increased water intake at home and close outpatient follow-up.  Patient and daughter are comfortable with this plan.  She will follow-up with her endocrinologist as well.  Discussed again diet changes.   At this time, I do not feel there is any life-threatening condition present. I have reviewed, interpreted and discussed all results (EKG, imaging, lab, urine as appropriate) and exam findings with patient/family. I have reviewed nursing notes and appropriate previous records.  I feel the patient is safe to be discharged home without further emergent workup and can continue workup as an outpatient as needed. Discussed usual and customary return precautions. Patient/family verbalize understanding and are comfortable with this plan.  Outpatient follow-up has been provided as needed. All questions have been answered.   Mirtha Marja Adderley was evaluated in Emergency Department on 05/26/2019 for the symptoms described in the history of present illness. She was evaluated in the context of the global COVID-19 pandemic, which necessitated consideration that the patient might be at risk for infection with the SARS-CoV-2 virus that causes COVID-19. Institutional protocols and algorithms that pertain to the evaluation of patients at risk for COVID-19 are in a state of rapid change based on information released by regulatory  bodies including the CDC and federal and state organizations. These policies and algorithms were followed during the patient's care in the ED.  Patient was seen wearing N95, face shield, gloves.    Evangelyn Crouse, 05/28/2019, DO 05/26/19 [phone]

## 2019-05-27 LAB — URINE CULTURE

## 2019-06-03 DIAGNOSIS — Z79899 Other long term (current) drug therapy: Secondary | ICD-10-CM | POA: Diagnosis not present

## 2019-06-03 DIAGNOSIS — F250 Schizoaffective disorder, bipolar type: Secondary | ICD-10-CM | POA: Diagnosis not present

## 2019-06-04 ENCOUNTER — Encounter: Payer: Self-pay | Admitting: Medical

## 2019-06-04 ENCOUNTER — Other Ambulatory Visit: Payer: Self-pay | Admitting: Medical

## 2019-06-04 ENCOUNTER — Other Ambulatory Visit: Payer: Self-pay

## 2019-06-04 ENCOUNTER — Ambulatory Visit (INDEPENDENT_AMBULATORY_CARE_PROVIDER_SITE_OTHER): Payer: Medicare Other | Admitting: Medical

## 2019-06-04 VITALS — BP 130/80 | HR 101 | Temp 98.8°F | Ht 64.0 in | Wt 232.8 lb

## 2019-06-04 DIAGNOSIS — E871 Hypo-osmolality and hyponatremia: Secondary | ICD-10-CM | POA: Diagnosis not present

## 2019-06-04 DIAGNOSIS — R531 Weakness: Secondary | ICD-10-CM | POA: Diagnosis not present

## 2019-06-04 DIAGNOSIS — E1169 Type 2 diabetes mellitus with other specified complication: Secondary | ICD-10-CM | POA: Diagnosis not present

## 2019-06-04 DIAGNOSIS — R42 Dizziness and giddiness: Secondary | ICD-10-CM | POA: Insufficient documentation

## 2019-06-04 DIAGNOSIS — F411 Generalized anxiety disorder: Secondary | ICD-10-CM | POA: Diagnosis not present

## 2019-06-04 DIAGNOSIS — I10 Essential (primary) hypertension: Secondary | ICD-10-CM

## 2019-06-04 DIAGNOSIS — Z2821 Immunization not carried out because of patient refusal: Secondary | ICD-10-CM | POA: Diagnosis not present

## 2019-06-04 DIAGNOSIS — Z2820 Immunization not carried out because of patient decision for unspecified reason: Secondary | ICD-10-CM | POA: Diagnosis not present

## 2019-06-04 DIAGNOSIS — B373 Candidiasis of vulva and vagina: Secondary | ICD-10-CM | POA: Diagnosis not present

## 2019-06-04 DIAGNOSIS — F319 Bipolar disorder, unspecified: Secondary | ICD-10-CM

## 2019-06-04 DIAGNOSIS — Z1231 Encounter for screening mammogram for malignant neoplasm of breast: Secondary | ICD-10-CM | POA: Diagnosis not present

## 2019-06-04 DIAGNOSIS — E1165 Type 2 diabetes mellitus with hyperglycemia: Secondary | ICD-10-CM

## 2019-06-04 DIAGNOSIS — F250 Schizoaffective disorder, bipolar type: Secondary | ICD-10-CM

## 2019-06-04 DIAGNOSIS — B3731 Acute candidiasis of vulva and vagina: Secondary | ICD-10-CM

## 2019-06-04 DIAGNOSIS — Z794 Long term (current) use of insulin: Secondary | ICD-10-CM

## 2019-06-04 MED ORDER — FLUCONAZOLE 150 MG PO TABS: ORAL_TABLET | ORAL | 0 refills | Status: DC

## 2019-06-04 NOTE — Patient Instructions (Addendum)
You told me today you are taking Insulin mix 50 units 3 times daily and Metformin 500mg  XL, 2 tablets twice daily   Lets increase your insulin to 52 units three times daily.   I will contact Dr. , diabetes specialist to get you back in for follow up ASAP.  Continue to monitor your sugars and write them down.    Call Dr. Lonzo Cloud office to get back in for follow up as soon as possible. Phone: [phone]     Please call to schedule your mammogram.    [facility]  [phone] [address] [place], [place]

## 2019-06-04 NOTE — Progress Notes (Signed)
Subjective: Chief Complaint  Patient presents with  . Follow-up    diabetes    Medical team: Dr. Lonzo Cloud, endocrinology Dr. Raelyn Mora, CNM with gynecology Dr. Sharyn Lull, cardiology  Here for f/u on uncontrolled sugar.   Was seen in the emergency dept recently for elevated sugars.    She feels she has been eating pretty healthy.   Exercising with walking daily, 2 hours per day.  Walks the track.  Using 50 units 3 times per day of insulin.  She thinks its the 70/30 mix insulin.  Taking 2 Metformin 500mg  BID.  Her caregiver comes daily and is "taking good care of her."  Checking sugars every meal.   All numbers are running over 300.     Feeling weak, dizzy.  Drinks a lot of water.  No numbness, tingling, chest pain, SOB.  Just weak.   Saw gynecologist recently for vaginal yeast infection.  Was treated but advised to f/u with or diabetes specialist to get the sugars under control.    Still has same symptoms and is out of medication.  Raw in genital region  Has her second covid vaccine 06/11/19.  Saw cardiology last month for follow up.   No med changes at that time.  Cardiology felt things were stable per her recollection.  She avoids NSAIDs due to potential damage to kidneys   Past Medical History:  Diagnosis Date  . Alopecia   . Anemia   . Anxiety   . Bipolar 1 disorder (HCC)   . COPD (chronic obstructive pulmonary disease) (HCC)   . Coronary artery disease   . Depression   . Diabetes mellitus 2010  . Gastritis   . GERD (gastroesophageal reflux disease)   . Headache    migraines  . Hypertension 2010  . Obesity   . Pneumonia   . PONV (postoperative nausea and vomiting)   . PTSD (post-traumatic stress disorder)   . Schizophrenia (HCC)   . Tobacco use    Current Outpatient Medications on File Prior to Visit  Medication Sig Dispense Refill  . albuterol (VENTOLIN HFA) 108 (90 Base) MCG/ACT inhaler INHALE 2 PUFFS INTO THE LUNGS EVERY 6 HOURS AS NEEDED FOR WHEEZING 54 g  0  . amantadine (SYMMETREL) 100 MG capsule Take 100 mg by mouth 2 (two) times daily. FOR TREMORS  0  . amLODipine (NORVASC) 10 MG tablet TAKE 1 TABLET(10 MG) BY MOUTH DAILY (Patient taking differently: Take 10 mg by mouth daily. TAKE 1 TABLET(10 MG) BY MOUTH DAILY) 90 tablet 1  . atenolol (TENORMIN) 50 MG tablet Take 1 tablet (50 mg total) by mouth 2 (two) times daily. 180 tablet 3  . AUSTEDO 6 MG TABS Take 6 mg by mouth 2 (two) times daily.  0  . benztropine (COGENTIN) 2 MG tablet Take 2 mg by mouth 2 (two) times daily. FOR TREMORS AND MOUTH MOVEMENTS  0  . busPIRone (BUSPAR) 15 MG tablet Take 15 mg by mouth 2 (two) times daily. Reported on 04/20/2015    . diphenhydrAMINE (BENADRYL) 25 mg capsule Take 50 mg by mouth at bedtime. Reported on 04/07/2015    . doxepin (SINEQUAN) 10 MG capsule     . FANAPT 6 MG TABS Take 6 mg by mouth daily.   0  . FLUoxetine (PROZAC) 40 MG capsule Take 40 mg by mouth daily.  0  . Fluticasone-Salmeterol (ADVAIR DISKUS) 250-50 MCG/DOSE AEPB Inhale 1 puff into the lungs 2 (two) times daily. 60 each 5  . fluvoxaMINE (  LUVOX) 25 MG tablet Take 25 mg by mouth 2 (two) times daily.    Marland Kitchen glucose blood test strip 1 each by Other route 3 (three) times daily. Use as instructed 100 each 11  . insulin aspart protamine - aspart (NOVOLOG MIX 70/30 FLEXPEN) (70-30) 100 UNIT/ML FlexPen Inject 0.44 mLs (44 Units total) into the skin 2 (two) times daily with a meal. 27 mL 11  . Insulin Pen Needle (BD PEN NEEDLE NANO U/F) 32G X 4 MM MISC USE TWICE DAILY 100 each 11  . INVEGA TRINZA 819 MG/2.625ML SUSP daily.   0  . losartan-hydrochlorothiazide (HYZAAR) 100-25 MG tablet TAKE 1 TABLET BY MOUTH DAILY 90 tablet 0  . loxapine (LOXITANE) 25 MG capsule Take 25 mg by mouth at bedtime.   1  . metFORMIN (GLUCOPHAGE-XR) 500 MG 24 hr tablet TAKE 2 TABLETS BY MOUTH DAILY WITH BREAKFAST AND TAKE 1 TABLET DAILY WITH SUPPER 90 tablet 0  . nitroGLYCERIN (NITROSTAT) 0.4 MG SL tablet Place 0.4 mg under the  tongue every 5 (five) minutes as needed for chest pain. Reported on 07/15/2015    . pantoprazole (PROTONIX) 40 MG tablet TAKE 1 TABLET BY MOUTH TWICE DAILY (Patient taking differently: Take 40 mg by mouth in the morning, at noon, and at bedtime. ) 180 tablet 0  . rosuvastatin (CRESTOR) 20 MG tablet Take 1 tablet (20 mg total) by mouth daily. 90 tablet 3  . VRAYLAR capsule Take 1.5 mg by mouth at bedtime.   1   No current facility-administered medications on file prior to visit.      Objective: BP 130/80   Pulse (!) 101   Temp 98.8 F (37.1 C)   Ht 5\' 4"  (1.626 m)   Wt 232 lb 12.8 oz (105.6 kg)   SpO2 98%   BMI 39.96 kg/m   Wt Readings from Last 3 Encounters:  06/04/19 232 lb 12.8 oz (105.6 kg)  05/25/19 220 lb (99.8 kg)  01/29/19 222 lb 3.2 oz (100.8 kg)     General appearence: alert, no distress, WD/WN, African-American female Oral cavity: MMM, no lesions Neck: supple, no lymphadenopathy, no thyromegaly, no masses Heart: RRR, normal S1, S2, no murmurs Lungs: CTA bilaterally, no wheezes, rhonchi, or rales Abdomen: +bs, soft, non tender, non distended, no masses, no hepatomegaly, no splenomegaly Extremities: no edema, no cyanosis, no clubbing Pulses: 2+ symmetric, upper and lower extremities, normal cap refill Neurological: She is rocking back and forth somewhat shaking which is typical for her related to her anxiety, alert, oriented x 3, CN2-12 intact, strength normal upper extremities and lower extremities, sensation normal throughout, DTRs 2+ throughout, no cerebellar signs, gait normal    Assessment: Encounter Diagnoses  Name Primary?  Marland Kitchen Uncontrolled type 2 diabetes mellitus with hyperglycemia ([place]) Yes  . 23-polyvalent pneumococcal polysaccharide vaccine declined   . Type 2 diabetes mellitus with other specified complication, with long-term current use of insulin ([place])   . Essential hypertension   . Hyponatremia   . Vaccine refused by patient   . Encounter for  screening mammogram for malignant neoplasm of breast   . Weakness   . Dizziness   . Yeast vaginitis   . Bipolar affective disorder, remission status unspecified ([place])   . Generalized anxiety disorder   . Schizoaffective disorder, bipolar type ([place])      Plan: Dizziness, weakness, uncontrolled diabetes-I increased her insulin to 52 units 3 times a day up from 50 units 3 times a day.  I reached out to  Dr. Lonzo Cloud to get her back in with endocrinology ASAP.  Sugars are uncontrolled.  Basic metabolic lab today given her recent abnormal findings at the hospital.  I reviewed her recent hospital ED visit results and records.  Yeast vaginitis-continue Diflucan once weekly for the next 3 weeks  Hypertension-controlled, continue current medication, sees cardiology and had a recent follow-up with cardiology  Schizoaffective disorder, anxiety, bipolar-managed by psychiatry  Past due on mammogram.  Advise she go ahead and schedule this  She declines pneumococcal vaccine and shingles vaccine.  She is set to get her second Covid vaccine soon  Cheyenne Alvarez was seen today for follow-up.  Diagnoses and all orders for this visit:  Uncontrolled type 2 diabetes mellitus with hyperglycemia (HCC)  23-polyvalent pneumococcal polysaccharide vaccine declined  Type 2 diabetes mellitus with other specified complication, with long-term current use of insulin (HCC)  Essential hypertension -     Basic metabolic panel  Hyponatremia -     Basic metabolic panel  Vaccine refused by patient  Encounter for screening mammogram for malignant neoplasm of breast -     MM DIGITAL SCREENING BILATERAL; Future  Weakness -     Basic metabolic panel  Dizziness -     Basic metabolic panel  Yeast vaginitis  Bipolar affective disorder, remission status unspecified (HCC)  Generalized anxiety disorder  Schizoaffective disorder, bipolar type (HCC)  Other orders -     fluconazole (DIFLUCAN) 150 MG tablet; 1 tablet  weekly  f/u pending labs, f/u with endocrinology

## 2019-06-05 ENCOUNTER — Telehealth: Payer: Self-pay | Admitting: Internal Medicine

## 2019-06-05 ENCOUNTER — Ambulatory Visit (INDEPENDENT_AMBULATORY_CARE_PROVIDER_SITE_OTHER): Payer: Medicare Other | Admitting: Internal Medicine

## 2019-06-05 ENCOUNTER — Encounter: Payer: Self-pay | Admitting: Internal Medicine

## 2019-06-05 VITALS — BP 124/84 | HR 80 | Temp 98.2°F | Ht 64.0 in | Wt 231.6 lb

## 2019-06-05 DIAGNOSIS — E1165 Type 2 diabetes mellitus with hyperglycemia: Secondary | ICD-10-CM | POA: Diagnosis not present

## 2019-06-05 DIAGNOSIS — Z794 Long term (current) use of insulin: Secondary | ICD-10-CM

## 2019-06-05 LAB — POCT GLYCOSYLATED HEMOGLOBIN (HGB A1C): Hemoglobin A1C: 11.9 % — AB (ref 4.0–5.6)

## 2019-06-05 LAB — BASIC METABOLIC PANEL
BUN/Creatinine Ratio: 9 (ref 9–23)
BUN: 9 mg/dL (ref 6–24)
CO2: 21 mmol/L (ref 20–29)
Calcium: 9.5 mg/dL (ref 8.7–10.2)
Chloride: 97 mmol/L (ref 96–106)
Creatinine, Ser: 1.04 mg/dL — ABNORMAL HIGH (ref 0.57–1.00)
GFR calc Af Amer: 70 mL/min/{1.73_m2} (ref >59)
GFR calc non Af Amer: 61 mL/min/{1.73_m2} (ref >59)
Glucose: 299 mg/dL — ABNORMAL HIGH (ref 65–99)
Potassium: 4.4 mmol/L (ref 3.5–5.2)
Sodium: 135 mmol/L (ref 134–144)

## 2019-06-05 LAB — GLUCOSE, POCT (MANUAL RESULT ENTRY): POC Glucose: 261 mg/dl — AB (ref 70–99)

## 2019-06-05 MED ORDER — TRULICITY 0.75 MG/0.5ML ~~LOC~~ SOAJ: 0.7500 mg | SUBCUTANEOUS | 4 refills | Status: DC

## 2019-06-05 NOTE — Progress Notes (Signed)
Name: Cheyenne Alvarez  Age/ Sex: 54 y.o., female   MRN/ DOB: [id number], November 01, 1965     PCP: Jac Canavan, PA-C   Reason for Endocrinology Evaluation: Type 2 Diabetes Mellitus  Initial Endocrine Consultative Visit: 03/08/2016    PATIENT IDENTIFIER: Cheyenne Alvarez is a 54 y.o. female with a past medical history of T2DM, HTN, Bipolar d/o, Schizophrenia . The patient has followed with Endocrinology clinic since 03/08/2016 for consultative assistance with management of her diabetes.  DIABETIC HISTORY:  Cheyenne Alvarez was diagnosed with T2DM in 2010,  she has been on Metformin since diagnosis, and started insulin therapy in 2015. Her hemoglobin A1c has ranged from 6.3 % in 2011 peaking at 13.0%  in 2020   Pt was lost to follow up from 2018 and reestablished in 06/2018.  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 She lives alone but her 3 daughters and a son  help.   Previous hx with non-compliance.   SUBJECTIVE:   During the last visit (08/08/2018): We increased Novolog Mix and continued Metformin    Today (06/05/2019): Cheyenne Alvarez is here for a 6 week follow up on diabetes management.  She checks her blood sugars 2 times daily, preprandial.The patient has not had hypoglycemic episodes since the last clinic visit. Otherwise, the patient has not required any recent emergency interventions for hypoglycemia but has had recent hospitalizations secondary to hyperglycemic episodes. Family reported pt consuming candy and sugar-sweetened beverages.   She continues to forget to take her insulin at times, but has been doing better since her ED visit     ROS: As per HPI and as detailed below: Review of Systems  Constitutional: Negative for fever.  HENT: Negative for congestion and sore throat.   Respiratory: Negative for cough and shortness of breath.   Cardiovascular: Negative for chest pain and palpitations.  Gastrointestinal:  Negative for diarrhea and nausea.      HOME DIABETES REGIMEN:  Novolog Mix 52 units BID  Metformin 500 mg, 2 tablets  BID    METER DOWNLOAD SUMMARY: Did not bring     HISTORY:  Past Medical History:  Past Medical History:  Diagnosis Date  . Alopecia   . Anemia   . Anxiety   . Bipolar 1 disorder (HCC)   . COPD (chronic obstructive pulmonary disease) (HCC)   . Coronary artery disease   . Depression   . Diabetes mellitus 2010  . Gastritis   . GERD (gastroesophageal reflux disease)   . Headache    migraines  . Hypertension 2010  . Obesity   . Pneumonia   . PONV (postoperative nausea and vomiting)   . PTSD (post-traumatic stress disorder)   . Schizophrenia (HCC)   . Tobacco use    Past Surgical History:  Past Surgical History:  Procedure Laterality Date  . ABDOMINAL HYSTERECTOMY    . CARDIAC CATHETERIZATION    . CHOLECYSTECTOMY    . COLONOSCOPY  05/14/14   hemorrhoids, otherwise normal; Dr. Charlott Rakes  . COLONOSCOPY WITH PROPOFOL N/A 05/14/2014   Procedure: COLONOSCOPY WITH PROPOFOL;  Surgeon: Charlott Rakes, MD;  Location: [facility] ENDOSCOPY;  Service: Endoscopy;  Laterality: N/A;  . ESOPHAGOGASTRODUODENOSCOPY (EGD) WITH PROPOFOL N/A 05/14/2014   Procedure: ESOPHAGOGASTRODUODENOSCOPY (EGD) WITH PROPOFOL;  Surgeon: Charlott Rakes, MD;  Location: [facility] ENDOSCOPY;  Service: Endoscopy;  Laterality: N/A;  . FRACTURE SURGERY     left leg  . LEG SURGERY    .  TUBAL LIGATION      Social History:  reports that she quit smoking about 5 years ago. She has never used smokeless tobacco. She reports that she does not drink alcohol or use drugs. Family History:  Family History  Problem Relation Age of Onset  . Diabetes Mother   . Kidney failure Mother   . Diabetes Other   . Cancer Other   . Hypertension Other   . Breast cancer Sister 11  . Breast cancer Maternal Grandmother      HOME MEDICATIONS: Allergies as of 06/05/2019      Reactions   Dexilant [dexlansoprazole]  Shortness Of Breath, Diarrhea, Nausea And Vomiting, Other (See Comments)   Chest pain and abdominal pain (also)   Famotidine Anaphylaxis   Meperidine Hcl Anaphylaxis   Dilaudid [hydromorphone Hcl] Other (See Comments)   Change in mental state   Gabapentin Other (See Comments)   Memory loss   Hydrocodone Nausea And Vomiting   Ramipril Swelling, Other (See Comments)   Angioedema   Ziprasidone Hcl Other (See Comments)   Caused convulsions   Invokana [canagliflozin] Rash   Iohexol Itching, Rash   Tape Rash   Use paper tape only       Medication List       Accurate as of June 05, 2019  3:22 PM. If you have any questions, ask your nurse or doctor.        albuterol 108 (90 Base) MCG/ACT inhaler Commonly known as: VENTOLIN HFA INHALE 2 PUFFS INTO THE LUNGS EVERY 6 HOURS AS NEEDED FOR WHEEZING   amantadine 100 MG capsule Commonly known as: SYMMETREL Take 100 mg by mouth 2 (two) times daily. FOR TREMORS   amLODipine 10 MG tablet Commonly known as: NORVASC TAKE 1 TABLET(10 MG) BY MOUTH DAILY What changed: See the new instructions.   atenolol 50 MG tablet Commonly known as: TENORMIN Take 1 tablet (50 mg total) by mouth 2 (two) times daily.   Austedo 6 MG Tabs Generic drug: Deutetrabenazine Take 6 mg by mouth 2 (two) times daily.   BD Pen Needle Nano U/F 32G X 4 MM Misc Generic drug: Insulin Pen Needle USE TWICE DAILY   benztropine 2 MG tablet Commonly known as: COGENTIN Take 2 mg by mouth 2 (two) times daily. FOR TREMORS AND MOUTH MOVEMENTS   busPIRone 15 MG tablet Commonly known as: BUSPAR Take 15 mg by mouth 2 (two) times daily. Reported on 04/20/2015   diphenhydrAMINE 25 mg capsule Commonly known as: BENADRYL Take 50 mg by mouth at bedtime. Reported on 04/07/2015   doxepin 10 MG capsule Commonly known as: SINEQUAN   Fanapt 6 MG Tabs Generic drug: Iloperidone Take 6 mg by mouth daily.   fluconazole 150 MG tablet Commonly known as: DIFLUCAN 1 tablet weekly    FLUoxetine 40 MG capsule Commonly known as: PROZAC Take 40 mg by mouth daily.   Fluticasone-Salmeterol 250-50 MCG/DOSE Aepb Commonly known as: Advair Diskus Inhale 1 puff into the lungs 2 (two) times daily.   fluvoxaMINE 25 MG tablet Commonly known as: LUVOX Take 25 mg by mouth 2 (two) times daily.   glucose blood test strip 1 each by Other route 3 (three) times daily. Use as instructed   Invega Trinza 819 MG/2.625ML Susp Generic drug: Paliperidone Palmitate daily.   losartan-hydrochlorothiazide 100-25 MG tablet Commonly known as: HYZAAR TAKE 1 TABLET BY MOUTH DAILY   loxapine 25 MG capsule Commonly known as: LOXITANE Take 25 mg by mouth at bedtime.   metFORMIN  500 MG 24 hr tablet Commonly known as: GLUCOPHAGE-XR TAKE 2 TABLETS BY MOUTH DAILY WITH BREAKFAST AND TAKE 1 TABLET DAILY WITH SUPPER   nitroGLYCERIN 0.4 MG SL tablet Commonly known as: NITROSTAT Place 0.4 mg under the tongue every 5 (five) minutes as needed for chest pain. Reported on 07/15/2015   NovoLOG Mix 70/30 FlexPen (70-30) 100 UNIT/ML FlexPen Generic drug: insulin aspart protamine - aspart Inject 0.44 mLs (44 Units total) into the skin 2 (two) times daily with a meal. What changed: how much to take   pantoprazole 40 MG tablet Commonly known as: PROTONIX TAKE 1 TABLET BY MOUTH TWICE DAILY What changed: when to take this   rosuvastatin 20 MG tablet Commonly known as: Crestor Take 1 tablet (20 mg total) by mouth daily.   Trulicity 9.21 JH/4.1DE Sopn Generic drug: Dulaglutide Inject 0.75 mg into the skin once a week. Started by: Dorita Sciara, MD   Vraylar capsule Generic drug: cariprazine Take 1.5 mg by mouth at bedtime.        OBJECTIVE:   Vital Signs: BP 124/84 (BP Location: Left Arm, Patient Position: Sitting, Cuff Size: Large)   Pulse 80   Temp 98.2 F (36.8 C)   Ht 5\' 4"  (1.626 m)   Wt 231 lb 9.6 oz (105.1 kg)   SpO2 99%   BMI 39.75 kg/m   Wt Readings from Last 3  Encounters:  06/05/19 231 lb 9.6 oz (105.1 kg)  06/04/19 232 lb 12.8 oz (105.6 kg)  05/25/19 220 lb (99.8 kg)     Exam: General: Pt appears well and is in NAD  Extremities: No pretibial edema. No tremor.  Neuro: MS is good with appropriate affect    DM foot exam: 06/05/2019 The skin of the feet is intact without sores or ulcerations.pt with plantar callous formation.  The pedal pulses are 2+ on right and 2+ on left. The sensation is intact to a screening 5.07, 10 gram monofilament bilaterally     DATA REVIEWED:  Lab Results  Component Value Date   HGBA1C 11.9 (A) 06/05/2019   HGBA1C 12.6 (A) 09/19/2018   HGBA1C 13.0 (H) 07/03/2018   Lab Results  Component Value Date   MICROALBUR 0.9 07/01/2016   LDLCALC 136 (H) 11/06/2018   CREATININE 1.04 (H) 06/04/2019   Lab Results  Component Value Date   MICRALBCREAT <3.0 03/13/2018     Lab Results  Component Value Date   CHOL 213 (H) 11/06/2018   HDL 59 11/06/2018   LDLCALC 136 (H) 11/06/2018   TRIG 101 11/06/2018   CHOLHDL 3.6 11/06/2018         In-Office BG 261 mg/dL  ASSESSMENT / PLAN / RECOMMENDATIONS:   1) Type 2 Diabetes Mellitus, Poorly controlled, Without complications - Most recent A1c of 11.9 %. Goal A1c < 7.0 %.    - Main barrier to diabetes care is memory issues, I have called her daughter Otila Kluver today and she tells me mother takes her morning insulin but she is not sure if takes the evening dose. She also stated that mother eats things she is not supposed to be eating.  - Unfortunately the role of endocrinology is very limited when the pt does not take medications as prescribed or has dietary indiscretions,this requires family , social work, home health aid involvement etc  - Daughter assured me she will remind mother of evening dose, I am also going to see if we can add a GLP-1 agonists on sundays, we discussed GI side  effects  - She is intolerant to SGLT-2 inhibitors.  - We again discussed the importance  of glucose checks and having this data available to me.    MEDICATIONS:  Continue  Novolog Mix 52 units BID with meals (breakfast and Supper)  Continue Metformin 500 mg XR , 2 Tabs with breakfast and 2 tab with supper  Start Trulicity 0.75 mg weekly   EDUCATION / INSTRUCTIONS:  BG monitoring instructions: Patient is instructed to check her blood sugars 2 times a day, breakfast and supper.  Call [place] [facility] clinic if: BG persistently < 70 or > 300. . I reviewed the Rule of 15 for the treatment of hypoglycemia in detail with the patient. Literature supplied.     F/U in 3 months     Signed electronically by: Lyndle Herrlich, MD  [facility] Endocrinology  [facility] [address] [place], [place] [address] Phone: [phone] FAX: [phone]   CC: Jac Canavan, PA-C [address] [place] [address] Phone: [phone]  Fax: [phone]  Return to Endocrinology clinic as below: Future Appointments  Date Time Provider Department Center  06/11/2019 10:15 AM [facility] [facility]  07/04/2019 10:15 AM Jac Canavan, PA-C [facility]  07/23/2019 11:10 AM [facility] MM 3 [facility]  09/06/2019  9:50 AM , Konrad Dolores, MD [facility] None

## 2019-06-05 NOTE — Patient Instructions (Addendum)
-   Take Insulin INjection  52 units With Breakfast and 52 units with Supper  - Take Metformin 2 tablets with breakfast and 2 tablet with Supper - Trulicity 0.75 mg ONCE a week ( same day every week)     - Please bring your meter on next visit     HOW TO TREAT LOW BLOOD SUGARS (Blood sugar LESS THAN 70 MG/DL)  Please follow the RULE OF 15 for the treatment of hypoglycemia treatment (when your (blood sugars are less than 70 mg/dL)    STEP 1: Take 15 grams of carbohydrates when your blood sugar is low, which includes:   3-4 GLUCOSE TABS  OR  3-4 OZ OF JUICE OR REGULAR SODA OR  ONE TUBE OF GLUCOSE GEL     STEP 2: RECHECK blood sugar in 15 MINUTES STEP 3: If your blood sugar is still low at the 15 minute recheck --> then, go back to STEP 1 and treat AGAIN with another 15 grams of carbohydrates.

## 2019-06-11 ENCOUNTER — Ambulatory Visit: Payer: Medicare Other | Attending: Family

## 2019-06-11 DIAGNOSIS — Z23 Encounter for immunization: Secondary | ICD-10-CM

## 2019-06-11 NOTE — Progress Notes (Signed)
   Covid-19 Vaccination Clinic  Name:  Cheyenne Alvarez    MRN: [id number] DOB: 1965/11/19  06/11/2019  Ms. Hunger was observed post Covid-19 immunization for 15 minutes without incident. She was provided with Vaccine Information Sheet and instruction to access the V-Safe system.   [name] was instructed to call 911 with any severe reactions post vaccine: Marland Kitchen Difficulty breathing  . Swelling of face and throat  . A fast heartbeat  . A bad rash all over body  . Dizziness and weakness   Immunizations Administered    Name Date Dose VIS Date Route   Moderna COVID-19 Vaccine 06/11/2019  9:59 AM 0.5 mL 01/29/2019 Intramuscular   Manufacturer: Moderna   Lot: [id number]   NDC: 63875-643-32

## 2019-06-11 NOTE — Telephone Encounter (Signed)
Discussed concerns about hyperglycemia as well as memory and medication non adherence with the daughter Inetta Fermo, she tells me mother takes medications in the morning but she is not sure if mother takes it at night.   I have reviewed the new recommendations with Inetta Fermo.    She is going to check on mother and try to remind her about taking medications .  She has a home aid in the morning and Inetta Fermo will review with her.     Abby Raelyn Mora, MD  [facility] Endocrinology  [facility] [address] [place], [place] [address] Phone: [phone] FAX: [phone]

## 2019-06-17 ENCOUNTER — Other Ambulatory Visit: Payer: Self-pay | Admitting: Medical

## 2019-07-02 ENCOUNTER — Other Ambulatory Visit: Payer: Self-pay | Admitting: Medical

## 2019-07-03 ENCOUNTER — Other Ambulatory Visit: Payer: Self-pay | Admitting: Medical

## 2019-07-04 ENCOUNTER — Ambulatory Visit: Payer: Medicare Other | Admitting: Medical

## 2019-07-17 DIAGNOSIS — F250 Schizoaffective disorder, bipolar type: Secondary | ICD-10-CM | POA: Diagnosis not present

## 2019-07-17 DIAGNOSIS — Z79899 Other long term (current) drug therapy: Secondary | ICD-10-CM | POA: Diagnosis not present

## 2019-07-23 ENCOUNTER — Ambulatory Visit
Admission: RE | Admit: 2019-07-23 | Discharge: 2019-07-23 | Disposition: A | Discharge status: Home / Self Care | Payer: Medicare Other | Source: Ambulatory Visit | Attending: Medical | Admitting: Medical

## 2019-07-23 ENCOUNTER — Other Ambulatory Visit: Payer: Self-pay | Admitting: Medical

## 2019-07-23 ENCOUNTER — Other Ambulatory Visit: Payer: Self-pay

## 2019-07-23 DIAGNOSIS — Z1231 Encounter for screening mammogram for malignant neoplasm of breast: Secondary | ICD-10-CM | POA: Diagnosis not present

## 2019-07-26 DIAGNOSIS — E119 Type 2 diabetes mellitus without complications: Secondary | ICD-10-CM | POA: Diagnosis not present

## 2019-07-26 DIAGNOSIS — E785 Hyperlipidemia, unspecified: Secondary | ICD-10-CM | POA: Diagnosis not present

## 2019-07-26 DIAGNOSIS — F319 Bipolar disorder, unspecified: Secondary | ICD-10-CM | POA: Diagnosis not present

## 2019-07-26 DIAGNOSIS — F419 Anxiety disorder, unspecified: Secondary | ICD-10-CM | POA: Diagnosis not present

## 2019-07-26 DIAGNOSIS — F341 Dysthymic disorder: Secondary | ICD-10-CM | POA: Diagnosis not present

## 2019-07-26 DIAGNOSIS — I10 Essential (primary) hypertension: Secondary | ICD-10-CM | POA: Diagnosis not present

## 2019-07-26 DIAGNOSIS — R0789 Other chest pain: Secondary | ICD-10-CM | POA: Diagnosis not present

## 2019-07-26 DIAGNOSIS — M1990 Unspecified osteoarthritis, unspecified site: Secondary | ICD-10-CM | POA: Diagnosis not present

## 2019-08-05 ENCOUNTER — Other Ambulatory Visit: Payer: Self-pay

## 2019-08-05 ENCOUNTER — Encounter: Payer: Self-pay | Admitting: Medical

## 2019-08-05 ENCOUNTER — Ambulatory Visit
Admission: RE | Admit: 2019-08-05 | Discharge: 2019-08-05 | Disposition: A | Discharge status: Home / Self Care | Payer: Medicare Other | Source: Ambulatory Visit | Attending: Medical | Admitting: Medical

## 2019-08-05 ENCOUNTER — Telehealth (INDEPENDENT_AMBULATORY_CARE_PROVIDER_SITE_OTHER): Payer: Medicare Other | Admitting: Medical

## 2019-08-05 VITALS — BP 123/83 | Ht 64.0 in | Wt 220.0 lb

## 2019-08-05 DIAGNOSIS — E1165 Type 2 diabetes mellitus with hyperglycemia: Secondary | ICD-10-CM

## 2019-08-05 DIAGNOSIS — R05 Cough: Secondary | ICD-10-CM | POA: Diagnosis not present

## 2019-08-05 DIAGNOSIS — J449 Chronic obstructive pulmonary disease, unspecified: Secondary | ICD-10-CM | POA: Diagnosis not present

## 2019-08-05 DIAGNOSIS — R0602 Shortness of breath: Secondary | ICD-10-CM

## 2019-08-05 DIAGNOSIS — R059 Cough, unspecified: Secondary | ICD-10-CM

## 2019-08-05 MED ORDER — DOXYCYCLINE HYCLATE 100 MG PO TABS: 100.0000 mg | ORAL_TABLET | Freq: Two times a day (BID) | ORAL | 0 refills | Status: DC

## 2019-08-05 MED ORDER — PROMETHAZINE-DM 6.25-15 MG/5ML PO SYRP: 5.0000 mL | ORAL_SOLUTION | Freq: Four times a day (QID) | ORAL | 0 refills | Status: DC | PRN

## 2019-08-05 NOTE — Progress Notes (Signed)
Subjective:     Patient ID: Cheyenne Alvarez, female   DOB: 1965/12/27, 54 y.o.   MRN: [id number]  This visit type was conducted due to national recommendations for restrictions regarding the COVID-19 Pandemic (e.g. social distancing) in an effort to limit this patient's exposure and mitigate transmission in our community.  Due to their co-morbid illnesses, this patient is at least at moderate risk for complications without adequate follow up.  This format is felt to be most appropriate for this patient at this time.    Documentation for virtual audio and video telecommunications through Zoom encounter:  The patient was located at home. The provider was located in the office. The patient did consent to this visit and is aware of possible charges through their insurance for this visit.  The other persons participating in this telemedicine service were none. Time spent on call was 20 minutes and in review of previous records 20 minutes total.  This virtual service is not related to other E/M service within previous 7 days.   HPI Chief Complaint  Patient presents with  . Cough   Virtual consult today for cough.    She notes cough x 2 weeks.  Her kids called out EMS last week about her having cough and hurting in chest   Paramedics felt she was no high risk, and advised she call our office to be seen regarding cough, possible antibiotics.    Using mucinex.  She denies fever.   Has had some nausea, vomited twice.   Has had some loose stool, mild.  Feels a little SOB.  No ear pain, no sore throat.    Has had covid vaccine.  No sick contacts.    Still using the Advair BID and albuterol inhaler 2 times per day.  Past Medical History:  Diagnosis Date  . Alopecia   . Anemia   . Anxiety   . Bipolar 1 disorder (HCC)   . COPD (chronic obstructive pulmonary disease) (HCC)   . Coronary artery disease   . Depression   . Diabetes mellitus 2010  . Gastritis   . GERD (gastroesophageal  reflux disease)   . Headache    migraines  . Hypertension 2010  . Obesity   . Pneumonia   . PONV (postoperative nausea and vomiting)   . PTSD (post-traumatic stress disorder)   . Schizophrenia (HCC)   . Tobacco use    Current Outpatient Medications on File Prior to Visit  Medication Sig Dispense Refill  . albuterol (VENTOLIN HFA) 108 (90 Base) MCG/ACT inhaler INHALE 2 PUFFS INTO THE LUNGS EVERY 6 HOURS AS NEEDED FOR WHEEZING 54 g 0  . amantadine (SYMMETREL) 100 MG capsule Take 100 mg by mouth 2 (two) times daily. FOR TREMORS  0  . amLODipine (NORVASC) 10 MG tablet TAKE 1 TABLET(10 MG) BY MOUTH DAILY (Patient taking differently: Take 10 mg by mouth daily. TAKE 1 TABLET(10 MG) BY MOUTH DAILY) 90 tablet 1  . atenolol (TENORMIN) 50 MG tablet TAKE 1 TABLET(50 MG) BY MOUTH TWICE DAILY 180 tablet 3  . AUSTEDO 6 MG TABS Take 6 mg by mouth 2 (two) times daily.  0  . benztropine (COGENTIN) 2 MG tablet Take 2 mg by mouth 2 (two) times daily. FOR TREMORS AND MOUTH MOVEMENTS  0  . busPIRone (BUSPAR) 15 MG tablet Take 15 mg by mouth 2 (two) times daily. Reported on 04/20/2015    . diphenhydrAMINE (BENADRYL) 25 mg capsule Take 50 mg by mouth at bedtime. Reported  on 04/07/2015    . doxepin (SINEQUAN) 10 MG capsule     . Dulaglutide (TRULICITY) 3.32 RJ/1.8AC SOPN Inject 0.75 mg into the skin once a week. 4 pen 4  . FANAPT 6 MG TABS Take 6 mg by mouth daily.   0  . fluconazole (DIFLUCAN) 150 MG tablet 1 tablet weekly 3 tablet 0  . FLUoxetine (PROZAC) 40 MG capsule Take 40 mg by mouth daily.  0  . Fluticasone-Salmeterol (ADVAIR DISKUS) 250-50 MCG/DOSE AEPB Inhale 1 puff into the lungs 2 (two) times daily. 60 each 5  . fluvoxaMINE (LUVOX) 25 MG tablet Take 25 mg by mouth 2 (two) times daily.    Marland Kitchen glucose blood test strip 1 each by Other route 3 (three) times daily. Use as instructed 100 each 11  . insulin aspart protamine - aspart (NOVOLOG MIX 70/30 FLEXPEN) (70-30) 100 UNIT/ML FlexPen Inject 0.44 mLs (44  Units total) into the skin 2 (two) times daily with a meal. (Patient taking differently: Inject 52 Units into the skin 2 (two) times daily with a meal. ) 27 mL 11  . Insulin Pen Needle (BD PEN NEEDLE NANO U/F) 32G X 4 MM MISC USE TWICE DAILY 100 each 11  . INVEGA TRINZA 819 MG/2.625ML SUSP daily.   0  . losartan-hydrochlorothiazide (HYZAAR) 100-25 MG tablet TAKE 1 TABLET BY MOUTH DAILY 90 tablet 0  . loxapine (LOXITANE) 25 MG capsule Take 25 mg by mouth at bedtime.   1  . metFORMIN (GLUCOPHAGE-XR) 500 MG 24 hr tablet TAKE 2 TABLETS BY MOUTH DAILY WITH BREAKFAST AND TAKE 1 TABLET DAILY WITH SUPPER 90 tablet 0  . nitroGLYCERIN (NITROSTAT) 0.4 MG SL tablet Place 0.4 mg under the tongue every 5 (five) minutes as needed for chest pain. Reported on 07/15/2015    . pantoprazole (PROTONIX) 40 MG tablet TAKE 1 TABLET BY MOUTH TWICE DAILY 180 tablet 0  . rosuvastatin (CRESTOR) 20 MG tablet Take 1 tablet (20 mg total) by mouth daily. 90 tablet 3  . VRAYLAR capsule Take 1.5 mg by mouth at bedtime.   1  . tiagabine (GABITRIL) 12 MG tablet Take 12 mg by mouth at bedtime.     No current facility-administered medications on file prior to visit.    Review of Systems As in subjective    Objective:   Physical Exam Due to coronavirus pandemic stay at home measures, patient visit was virtual and they were not examined in person.   BP 123/83   Ht 5\' 4"  (1.626 m)   Wt 220 lb (99.8 kg)   BMI 37.76 kg/m       Assessment:     Encounter Diagnoses  Name Primary?  . Cough Yes  . SOB (shortness of breath)   . Chronic obstructive pulmonary disease, unspecified COPD type ([place])   . Uncontrolled type 2 diabetes mellitus with hyperglycemia ([place])        Plan:     Discussed limitations of virtual consult .  Discussed symptoms, concerns, advised her to go get a chest x-ray.  Gave her contact information for where to go for this.  Advise rest, hydration, begin medication as below.  Advise she increase her  albuterol to 4 times a day instead of twice a day for this acute illness.   If much worse or not improving over the next several days recheck or consider emergency department evaluation  Cheyenne Alvarez was seen today for cough.  Diagnoses and all orders for this visit:  Cough -  DG Chest 2 View; Future  SOB (shortness of breath) -     DG Chest 2 View; Future  Chronic obstructive pulmonary disease, unspecified COPD type (HCC) -     DG Chest 2 View; Future  Uncontrolled type 2 diabetes mellitus with hyperglycemia (HCC) -     DG Chest 2 View; Future  Other orders -     promethazine-dextromethorphan (PROMETHAZINE-DM) 6.25-15 MG/5ML syrup; Take 5 mLs by mouth 4 (four) times daily as needed for cough. -     doxycycline (VIBRA-TABS) 100 MG tablet; Take 1 tablet (100 mg total) by mouth 2 (two) times daily.  f/u prn

## 2019-08-07 ENCOUNTER — Emergency Department (HOSPITAL_COMMUNITY)
Admission: EM | Admit: 2019-08-07 | Discharge: 2019-08-07 | Disposition: A | Discharge status: Home / Self Care | Payer: Medicare Other | Attending: Emergency Medicine | Admitting: Emergency Medicine

## 2019-08-07 ENCOUNTER — Emergency Department (HOSPITAL_COMMUNITY): Payer: Medicare Other

## 2019-08-07 DIAGNOSIS — J189 Pneumonia, unspecified organism: Secondary | ICD-10-CM | POA: Insufficient documentation

## 2019-08-07 DIAGNOSIS — Z79899 Other long term (current) drug therapy: Secondary | ICD-10-CM | POA: Insufficient documentation

## 2019-08-07 DIAGNOSIS — I119 Hypertensive heart disease without heart failure: Secondary | ICD-10-CM | POA: Diagnosis not present

## 2019-08-07 DIAGNOSIS — J449 Chronic obstructive pulmonary disease, unspecified: Secondary | ICD-10-CM | POA: Insufficient documentation

## 2019-08-07 DIAGNOSIS — Z7984 Long term (current) use of oral hypoglycemic drugs: Secondary | ICD-10-CM | POA: Diagnosis not present

## 2019-08-07 DIAGNOSIS — E119 Type 2 diabetes mellitus without complications: Secondary | ICD-10-CM | POA: Insufficient documentation

## 2019-08-07 DIAGNOSIS — R05 Cough: Secondary | ICD-10-CM | POA: Diagnosis present

## 2019-08-07 DIAGNOSIS — R1110 Vomiting, unspecified: Secondary | ICD-10-CM | POA: Insufficient documentation

## 2019-08-07 LAB — URINALYSIS, ROUTINE W REFLEX MICROSCOPIC
Bilirubin Urine: NEGATIVE
Glucose, UA: >=500 mg/dL — AB
Hgb urine dipstick: NEGATIVE
Ketones, ur: NEGATIVE mg/dL
Leukocytes,Ua: NEGATIVE
Nitrite: NEGATIVE
Protein, ur: NEGATIVE mg/dL
Specific Gravity, Urine: 1.017 (ref 1.005–1.030)
pH: 5 (ref 5.0–8.0)

## 2019-08-07 LAB — COMPREHENSIVE METABOLIC PANEL
ALT: 21 U/L (ref 0–44)
AST: 17 U/L (ref 15–41)
Albumin: 2.5 g/dL — ABNORMAL LOW (ref 3.5–5.0)
Alkaline Phosphatase: 117 U/L (ref 38–126)
Anion gap: 14 (ref 5–15)
BUN: 10 mg/dL (ref 6–20)
CO2: 20 mmol/L — ABNORMAL LOW (ref 22–32)
Calcium: 9.1 mg/dL (ref 8.9–10.3)
Chloride: 99 mmol/L (ref 98–111)
Creatinine, Ser: 1.02 mg/dL — ABNORMAL HIGH (ref 0.44–1.00)
GFR calc Af Amer: >60 mL/min (ref >60)
GFR calc non Af Amer: >60 mL/min (ref >60)
Glucose, Bld: 408 mg/dL — ABNORMAL HIGH (ref 70–99)
Potassium: 4 mmol/L (ref 3.5–5.1)
Sodium: 133 mmol/L — ABNORMAL LOW (ref 135–145)
Total Bilirubin: 0.4 mg/dL (ref 0.3–1.2)
Total Protein: 7.7 g/dL (ref 6.5–8.1)

## 2019-08-07 LAB — CBC
HCT: 31.2 % — ABNORMAL LOW (ref 36.0–46.0)
Hemoglobin: 10 g/dL — ABNORMAL LOW (ref 12.0–15.0)
MCH: 25.6 pg — ABNORMAL LOW (ref 26.0–34.0)
MCHC: 32.1 g/dL (ref 30.0–36.0)
MCV: 79.8 fL — ABNORMAL LOW (ref 80.0–100.0)
Platelets: 582 10*3/uL — ABNORMAL HIGH (ref 150–400)
RBC: 3.91 MIL/uL (ref 3.87–5.11)
RDW: 14.7 % (ref 11.5–15.5)
WBC: 16.4 10*3/uL — ABNORMAL HIGH (ref 4.0–10.5)
nRBC: 0 % (ref 0.0–0.2)

## 2019-08-07 LAB — CBG MONITORING, ED: Glucose-Capillary: 293 mg/dL — ABNORMAL HIGH (ref 70–99)

## 2019-08-07 LAB — LIPASE, BLOOD: Lipase: 30 U/L (ref 11–51)

## 2019-08-07 MED ORDER — BENZONATATE 100 MG PO CAPS
100.0000 mg | ORAL_CAPSULE | Freq: Three times a day (TID) | ORAL | 0 refills | Status: DC
Start: 2019-08-07 — End: 2019-08-21

## 2019-08-07 MED ORDER — PREDNISONE 20 MG PO TABS
60.0000 mg | ORAL_TABLET | Freq: Once | ORAL | Status: AC
  Administered 2019-08-07: 60 mg via ORAL
  Filled 2019-08-07: qty 3

## 2019-08-07 MED ORDER — SODIUM CHLORIDE 0.9 % IV BOLUS
250.0000 mL | Freq: Once | INTRAVENOUS | Status: AC
  Administered 2019-08-07: 250 mL via INTRAVENOUS

## 2019-08-07 MED ORDER — INSULIN ASPART 100 UNIT/ML ~~LOC~~ SOLN
8.0000 [IU] | Freq: Once | SUBCUTANEOUS | Status: AC
  Administered 2019-08-07: 8 [IU] via INTRAVENOUS

## 2019-08-07 MED ORDER — ONDANSETRON 4 MG PO TBDP
4.0000 mg | ORAL_TABLET | Freq: Once | ORAL | Status: AC
  Administered 2019-08-07: 4 mg via ORAL
  Filled 2019-08-07: qty 1

## 2019-08-07 MED ORDER — SODIUM CHLORIDE 0.9% FLUSH: 3.0000 mL | Freq: Once | INTRAVENOUS | Status: DC

## 2019-08-07 MED ORDER — ONDANSETRON 4 MG PO TBDP
4.0000 mg | ORAL_TABLET | Freq: Three times a day (TID) | ORAL | 0 refills | Status: DC | PRN
Start: 2019-08-07 — End: 2022-03-22

## 2019-08-07 NOTE — ED Notes (Signed)
Pt ambulated and O2 levels between 97-98.

## 2019-08-07 NOTE — ED Triage Notes (Signed)
Pt here for eval of vomiting since yesterday and no improvement in cough/overall feeling unwell since dx with pneumonia 2 days ago. Taking cough medication and abx. Denies abdominal pain. Negative covid test.

## 2019-08-07 NOTE — ED Notes (Signed)
Pt ambulated without difficulty, however became dizzy and out of breath toward the end of walk. 99% at all times

## 2019-08-07 NOTE — ED Provider Notes (Signed)
[facility] EMERGENCY DEPARTMENT Provider Note   CSN: [id number] Arrival date & time: 08/07/19  1102     History Chief Complaint  Patient presents with  . Pneumonia  . Emesis    Cheyenne Alvarez is a 54 y.o. female possible history of bipolar 1, COPD, CAD, diabetes, GERD who presents for evaluation of pneumonia.  She reports that for about 2 weeks, she has had cough, shortness of breath and just feeling bad.  She reports that she saw her doctor yesterday who had gotten a chest x-ray that showed.  She was started on doxycycline and promethazine which she started taking yesterday.  She states that she took a total of 4 pills.  She took 3 doses yesterday because she wanted to get in her system and 1 pill this morning.  She reports that last night, she started having some episodes of vomiting.  She states that these episodes mostly occur after coughing.  She has not had any other intermittent vomiting.  Denies any abdominal pain.  She states that night, she feels like she is wheezing.  She does have history of COPD.  She has had some soreness in her chest that only occurs with coughing.  She states she just does not feel any better so she came to the emergency department for evaluation.  She has not noted any fever, abdominal pain, urinary complaints.  The history is provided by the patient.       Past Medical History:  Diagnosis Date  . Alopecia   . Anemia   . Anxiety   . Bipolar 1 disorder (HCC)   . COPD (chronic obstructive pulmonary disease) (HCC)   . Coronary artery disease   . Depression   . Diabetes mellitus 2010  . Gastritis   . GERD (gastroesophageal reflux disease)   . Headache    migraines  . Hypertension 2010  . Obesity   . Pneumonia   . PONV (postoperative nausea and vomiting)   . PTSD (post-traumatic stress disorder)   . Schizophrenia (HCC)   . Tobacco use     Patient Active Problem List   Diagnosis Date Noted  . Hyponatremia 06/04/2019    . Weakness 06/04/2019  . Dizziness 06/04/2019  . Yeast vaginitis 06/04/2019  . Chronic obstructive pulmonary disease (HCC) 06/28/2017  . Former smoker 06/28/2017  . Tremor 06/28/2017  . Encounter for screening mammogram for malignant neoplasm of breast 07/01/2016  . High risk medication use 07/01/2016  . Vaccine counseling 07/01/2016  . Incontinence in female 07/01/2016  . Ovarian cyst 09/28/2015  . Anemia, iron deficiency 06/15/2015  . Hyperlipidemia 06/15/2015  . Pneumococcal vaccination declined 06/15/2015  . Decreased activities of daily living (ADL) 03/23/2015  . Cognitive safety issue 03/23/2015  . 23-polyvalent pneumococcal polysaccharide vaccine declined 03/23/2015  . Vaccine refused by patient 03/23/2015  . Cramping of feet 03/23/2015  . Schizoaffective disorder, bipolar type (HCC)   . Uncontrolled type 2 diabetes mellitus with hyperglycemia (HCC) 07/08/2014  . Essential hypertension 07/08/2014  . ACE inhibitor-aggravated angioedema 08/14/2012  . OBESITY 08/03/2009  . Diabetes mellitus (HCC) 04/06/2009  . BIPOLAR AFFECTIVE DISORDER 04/06/2009  . Generalized anxiety disorder 04/06/2009  . POST TRAUMATIC STRESS SYNDROME 04/06/2009  . GERD 04/06/2009  . MENOPAUSE, SURGICAL 04/06/2009  . LEG CRAMPS 04/06/2009  . CHOLECYSTECTOMY, HX OF 04/06/2009    Past Surgical History:  Procedure Laterality Date  . ABDOMINAL HYSTERECTOMY    . CARDIAC CATHETERIZATION    . CHOLECYSTECTOMY    .  COLONOSCOPY  05/14/14   hemorrhoids, otherwise normal; Dr. Charlott Rakes  . COLONOSCOPY WITH PROPOFOL N/A 05/14/2014   Procedure: COLONOSCOPY WITH PROPOFOL;  Surgeon: Charlott Rakes, MD;  Location: [facility] ENDOSCOPY;  Service: Endoscopy;  Laterality: N/A;  . ESOPHAGOGASTRODUODENOSCOPY (EGD) WITH PROPOFOL N/A 05/14/2014   Procedure: ESOPHAGOGASTRODUODENOSCOPY (EGD) WITH PROPOFOL;  Surgeon: Charlott Rakes, MD;  Location: [facility] ENDOSCOPY;  Service: Endoscopy;  Laterality: N/A;  . FRACTURE SURGERY      left leg  . LEG SURGERY    . TUBAL LIGATION       OB History    Gravida  5   Para      Term      Preterm      AB  1   Living  4     SAB  1   TAB      Ectopic      Multiple      Live Births              Family History  Problem Relation Age of Onset  . Diabetes Mother   . Kidney failure Mother   . Diabetes Other   . Cancer Other   . Hypertension Other   . Breast cancer Sister 49  . Breast cancer Maternal Grandmother     Social History   Tobacco Use  . Smoking status: Former Smoker    Quit date: 12/18/2013    Years since quitting: 5.6  . Smokeless tobacco: Never Used  Substance Use Topics  . Alcohol use: No    Alcohol/week: 0.0 standard drinks  . Drug use: No    Home Medications Prior to Admission medications   Medication Sig Start Date End Date Taking? Authorizing Provider  albuterol (VENTOLIN HFA) 108 (90 Base) MCG/ACT inhaler INHALE 2 PUFFS INTO THE LUNGS EVERY 6 HOURS AS NEEDED FOR WHEEZING Patient taking differently: Inhale 2 puffs into the lungs every 6 (six) hours as needed for wheezing or shortness of breath. INHALE 2 PUFFS INTO THE LUNGS EVERY 6 HOURS AS NEEDED FOR WHEEZING 07/23/19  Yes Tysinger, Kermit Balo, PA-C  amantadine (SYMMETREL) 100 MG capsule Take 100 mg by mouth 2 (two) times daily. FOR TREMORS 10/21/16  Yes [provider]  amLODipine (NORVASC) 10 MG tablet TAKE 1 TABLET(10 MG) BY MOUTH DAILY Patient taking differently: Take 10 mg by mouth daily. TAKE 1 TABLET(10 MG) BY MOUTH DAILY 05/07/19  Yes Tysinger, Kermit Balo, PA-C  atenolol (TENORMIN) 50 MG tablet TAKE 1 TABLET(50 MG) BY MOUTH TWICE DAILY Patient taking differently: Take 50 mg by mouth 2 (two) times daily.  07/02/19  Yes Tysinger, Kermit Balo, PA-C  AUSTEDO 6 MG TABS Take 6 mg by mouth 2 (two) times daily. 10/25/16  Yes [provider]  benztropine (COGENTIN) 2 MG tablet Take 2 mg by mouth 2 (two) times daily. FOR TREMORS AND MOUTH MOVEMENTS 10/24/16  Yes [provider]  busPIRone (BUSPAR) 15 MG tablet Take 15 mg by mouth 2 (two) times daily. Reported on 04/20/2015   Yes [provider]  diphenhydrAMINE (BENADRYL) 25 mg capsule Take 50 mg by mouth at bedtime. Reported on 04/07/2015   Yes [provider]  doxepin (SINEQUAN) 10 MG capsule Take 10 mg by mouth at bedtime.  05/30/19  Yes [provider]  doxycycline (VIBRA-TABS) 100 MG tablet Take 1 tablet (100 mg total) by mouth 2 (two) times daily. 08/05/19  Yes Tysinger, Kermit Balo, PA-C  Dulaglutide (TRULICITY) 0.75 MG/0.5ML SOPN Inject 0.75 mg into  the skin once a week. 06/05/19  Yes Shamleffer, Konrad Dolores, MD  FANAPT 6 MG TABS Take 6 mg by mouth daily.  04/01/15  Yes [provider]  FLUoxetine (PROZAC) 40 MG capsule Take 40 mg by mouth daily. 11/21/16  Yes [provider]  Fluticasone-Salmeterol (ADVAIR DISKUS) 250-50 MCG/DOSE AEPB Inhale 1 puff into the lungs 2 (two) times daily. 07/04/18  Yes Tysinger, Kermit Balo, PA-C  fluvoxaMINE (LUVOX) 25 MG tablet Take 25 mg by mouth 2 (two) times daily.   Yes [provider]  insulin aspart protamine - aspart (NOVOLOG MIX 70/30 FLEXPEN) (70-30) 100 UNIT/ML FlexPen Inject 0.44 mLs (44 Units total) into the skin 2 (two) times daily with a meal. Patient taking differently: Inject 52 Units into the skin in the morning, at noon, and at bedtime.  09/19/18  Yes Shamleffer, Konrad Dolores, MD  INVEGA TRINZA 819 MG/2.625ML SUSP Inject 819 mg into the skin daily.  08/07/15  Yes [provider]  losartan-hydrochlorothiazide (HYZAAR) 100-25 MG tablet TAKE 1 TABLET BY MOUTH DAILY 06/17/19  Yes Tysinger, Kermit Balo, PA-C  loxapine (LOXITANE) 25 MG capsule Take 25 mg by mouth at bedtime.  08/29/17  Yes [provider]  metFORMIN (GLUCOPHAGE-XR) 500 MG 24 hr tablet TAKE 2 TABLETS BY MOUTH DAILY WITH BREAKFAST AND TAKE 1 TABLET DAILY WITH SUPPER Patient taking differently: Take 1,000 mg by mouth daily with breakfast.  03/18/19   Yes Shamleffer, Konrad Dolores, MD  nitroGLYCERIN (NITROSTAT) 0.4 MG SL tablet Place 0.4 mg under the tongue every 5 (five) minutes as needed for chest pain. Reported on 07/15/2015   Yes [provider]  pantoprazole (PROTONIX) 40 MG tablet TAKE 1 TABLET BY MOUTH TWICE DAILY 07/02/19  Yes Tysinger, Kermit Balo, PA-C  promethazine-dextromethorphan (PROMETHAZINE-DM) 6.25-15 MG/5ML syrup Take 5 mLs by mouth 4 (four) times daily as needed for cough. 08/05/19  Yes Tysinger, Kermit Balo, PA-C  rosuvastatin (CRESTOR) 20 MG tablet Take 1 tablet (20 mg total) by mouth daily. 11/13/18  Yes Tysinger, Kermit Balo, PA-C  tiagabine (GABITRIL) 12 MG tablet Take 12 mg by mouth at bedtime. 07/26/19  Yes [provider]  VRAYLAR capsule Take 1.5 mg by mouth at bedtime.  09/06/17  Yes [provider]  benzonatate (TESSALON) 100 MG capsule Take 1 capsule (100 mg total) by mouth every 8 (eight) hours. 08/07/19   Maxwell Caul, PA-C  fluconazole (DIFLUCAN) 150 MG tablet 1 tablet weekly Patient not taking: Reported on 08/07/2019 06/04/19   Tysinger, Kermit Balo, PA-C  glucose blood test strip 1 each by Other route 3 (three) times daily. Use as instructed 01/07/19   Tysinger, Kermit Balo, PA-C  Insulin Pen Needle (BD PEN NEEDLE NANO U/F) 32G X 4 MM MISC USE TWICE DAILY 04/23/19   Tysinger, Kermit Balo, PA-C  ondansetron (ZOFRAN ODT) 4 MG disintegrating tablet Take 1 tablet (4 mg total) by mouth every 8 (eight) hours as needed for nausea or vomiting. 08/07/19   Maxwell Caul, PA-C    Allergies    Dexilant [dexlansoprazole], Famotidine, Meperidine hcl, Dilaudid [hydromorphone hcl], Gabapentin, Hydrocodone, Ramipril, Ziprasidone hcl, Invokana [canagliflozin], Iohexol, and Tape  Review of Systems   Review of Systems  Constitutional: Negative for fever.  Respiratory: Positive for cough and wheezing. Negative for shortness of breath.   Cardiovascular: Negative for chest pain.  Gastrointestinal: Positive for vomiting. Negative  for abdominal pain and nausea.  Genitourinary: Negative for dysuria and hematuria.  Neurological: Negative for headaches.  All other systems reviewed  and are negative.   Physical Exam Updated Vital Signs BP (!) 151/83 (BP Location: Left Arm)   Pulse 100   Temp 97.9 F (36.6 C) (Oral)   Resp 18   SpO2 100%   Physical Exam Vitals and nursing note reviewed.  Constitutional:      Appearance: Normal appearance. She is well-developed.  HENT:     Head: Normocephalic and atraumatic.  Eyes:     General: Lids are normal.     Conjunctiva/sclera: Conjunctivae normal.     Pupils: Pupils are equal, round, and reactive to light.  Cardiovascular:     Rate and Rhythm: Normal rate and regular rhythm.     Pulses: Normal pulses.     Heart sounds: Normal heart sounds. No murmur. No friction rub. No gallop.   Pulmonary:     Effort: Pulmonary effort is normal.     Breath sounds: Normal breath sounds.     Comments: No evidence of respiratory distress.  No wheezing noted.  Faint crackles noted at the bases.  Able to speak in full sentences without any difficulty. Abdominal:     Palpations: Abdomen is soft. Abdomen is not rigid.     Tenderness: There is no abdominal tenderness. There is no guarding.     Comments: Abdomen is soft, non-distended, non-tender. No rigidity, No guarding. No peritoneal signs.  Musculoskeletal:        General: Normal range of motion.     Cervical back: Full passive range of motion without pain.  Skin:    General: Skin is warm and dry.     Capillary Refill: Capillary refill takes less than 2 seconds.  Neurological:     Mental Status: She is alert and oriented to person, place, and time.  Psychiatric:        Speech: Speech normal.     ED Results / Procedures / Treatments   Labs (all labs ordered are listed, but only abnormal results are displayed) Labs Reviewed  COMPREHENSIVE METABOLIC PANEL - Abnormal; Notable for the following components:      Result Value    Sodium 133 (*)    CO2 20 (*)    Glucose, Bld 408 (*)    Creatinine, Ser 1.02 (*)    Albumin 2.5 (*)    All other components within normal limits  CBC - Abnormal; Notable for the following components:   WBC 16.4 (*)    Hemoglobin 10.0 (*)    HCT 31.2 (*)    MCV 79.8 (*)    MCH 25.6 (*)    Platelets 582 (*)    All other components within normal limits  URINALYSIS, ROUTINE W REFLEX MICROSCOPIC - Abnormal; Notable for the following components:   Glucose, UA >=500 (*)    Bacteria, UA RARE (*)    All other components within normal limits  CBG MONITORING, ED - Abnormal; Notable for the following components:   Glucose-Capillary 293 (*)    All other components within normal limits  LIPASE, BLOOD  I-STAT BETA HCG BLOOD, ED ([facility], [facility], [facility] ONLY)    EKG None  Radiology DG Chest 2 View  Result Date: 08/07/2019 CLINICAL DATA:  Cough and vomiting EXAM: CHEST - 2 VIEW COMPARISON:  August 05, 2019 FINDINGS: Ill-defined airspace opacity is again noted in the right mid to lower lung regions. No new opacity evident. Heart size and pulmonary vascularity are normal. No adenopathy. No bone lesions. IMPRESSION: Persistent ill-defined opacity right mid and lower lung zones consistent with pneumonia. No  new opacity evident. Stable cardiac silhouette. No evident adenopathy. Electronically Signed   By: Bretta BangWilliam  Woodruff III M.D.   On: 08/07/2019 12:11    Procedures Procedures (including critical care time)  Medications Ordered in ED Medications  sodium chloride flush (NS) 0.9 % injection 3 mL (has no administration in time range)  predniSONE (DELTASONE) tablet 60 mg (60 mg Oral Given 08/07/19 1411)  ondansetron (ZOFRAN-ODT) disintegrating tablet 4 mg (4 mg Oral Given 08/07/19 1423)  sodium chloride 0.9 % bolus 250 mL (0 mLs Intravenous Stopped 08/07/19 1709)  insulin aspart (novoLOG) injection 8 Units (8 Units Intravenous Given 08/07/19 1541)    ED Course  I have reviewed the triage vital signs and the nursing  notes.  Pertinent labs & imaging results that were available during my care of the patient were reviewed by me and considered in my medical decision making (see chart for details).    MDM Rules/Calculators/A&P                      54 year old female who presents for evaluation of continued and persistent pneumonia.  Recently diagnosed with doxycycline yesterday.  States she still continues to feel bad.  She had a few episodes of posttussive emesis last night and this morning.  Additionally, she felt like she has been wheezing.  No fevers.  She does report she was tested for Covid and was negative.  On arrival, she is afebrile, nontoxic-appearing.  She is slightly tachycardic and hypertensive but vitals otherwise stable.  No evidence of hypoxia.  Lungs show no evidence of wheezing.  Mild rales in the bases.  No evidence of respiratory distress.  It sounds like she has not had any episodes of vomiting aside from posttussive emesis.  She has been taking Phenergan cough syrup but still has been having cough.  We will give her dose of prednisone here in the emergency department.  Repeat chest x-ray, labs ordered at triage.  UA shows no evidence of infectious etiology.  This CMP shows glucose of 408.  Bicarb is 20, anion gap is 14.  Exam not concerning for DKA.  CBC shows leukocytosis of 16.4.  Hemoglobin is stable at 10.  Chest x-ray shows persistent pneumonia.  There is no new evidence of opacity noted.  Patient able to ambulate while maintaining O2 sats between 97-98.  Patient given fluids and insulin.  Her repeat CBG is 293.  At this time, do not feel that prednisone would be in her best interest given her high sugars.  Patient is requesting something for nausea/vomiting and more control of her cough.  At this time, do not feel that this is a failure of outpatient antibiotics as she has only been on antibiotics for 24 hours.  Additionally, she shows no signs of hypoxia here.  She has no evidence of  respiratory distress and is speaking full sentences with any difficulty with any any signs of wheezing. Discussed patient with Dr. Lockie Molauratolo who is in agreement with plan. At this time, patient exhibits no emergent life-threatening condition that require further evaluation in ED or admission. Patient had ample opportunity for questions and discussion. All patient's questions were answered with full understanding. Strict return precautions discussed. Patient expresses understanding and agreement to plan.   Portions of this note were generated with Scientist, clinical (histocompatibility and immunogenetics)Dragon dictation software. Dictation errors may occur despite best attempts at proofreading.   Final Clinical Impression(s) / ED Diagnoses Final diagnoses:  Community acquired pneumonia of right lower lobe of  lung    Rx / DC Orders ED Discharge Orders         Ordered    benzonatate (TESSALON) 100 MG capsule  Every 8 hours     08/07/19 1647    ondansetron (ZOFRAN ODT) 4 MG disintegrating tablet  Every 8 hours PRN     08/07/19 1647           Volanda Napoleon, PA-C 08/07/19 1854    Lennice Sites, DO 08/08/19 1240

## 2019-08-07 NOTE — Discharge Instructions (Signed)
Continue taking your antibiotics.  You can take Zofran for nausea.  Take Tessalon Perles to help with cough.  Closely monitor your blood sugar.  Return to the emergency department for any worsening difficulty breathing, high fever, persistent vomiting that is not stopped or any other worsening concerning symptoms.

## 2019-08-21 ENCOUNTER — Telehealth: Payer: Self-pay | Admitting: Medical

## 2019-08-21 ENCOUNTER — Encounter: Payer: Self-pay | Admitting: Medical

## 2019-08-21 ENCOUNTER — Telehealth (INDEPENDENT_AMBULATORY_CARE_PROVIDER_SITE_OTHER): Payer: Medicare Other | Admitting: Medical

## 2019-08-21 ENCOUNTER — Other Ambulatory Visit: Payer: Self-pay

## 2019-08-21 VITALS — BP 145/82 | HR 89 | Temp 97.8°F | Ht 64.0 in | Wt 220.0 lb

## 2019-08-21 DIAGNOSIS — J189 Pneumonia, unspecified organism: Secondary | ICD-10-CM

## 2019-08-21 DIAGNOSIS — J449 Chronic obstructive pulmonary disease, unspecified: Secondary | ICD-10-CM

## 2019-08-21 DIAGNOSIS — E1165 Type 2 diabetes mellitus with hyperglycemia: Secondary | ICD-10-CM | POA: Diagnosis not present

## 2019-08-21 DIAGNOSIS — R05 Cough: Secondary | ICD-10-CM | POA: Diagnosis not present

## 2019-08-21 DIAGNOSIS — F250 Schizoaffective disorder, bipolar type: Secondary | ICD-10-CM

## 2019-08-21 DIAGNOSIS — R059 Cough, unspecified: Secondary | ICD-10-CM

## 2019-08-21 MED ORDER — CLARITHROMYCIN 500 MG PO TABS
500.0000 mg | ORAL_TABLET | Freq: Two times a day (BID) | ORAL | 0 refills | Status: DC
Start: 2019-08-21 — End: 2019-10-17

## 2019-08-21 MED ORDER — PROMETHAZINE-CODEINE 6.25-10 MG/5ML PO SOLN: 5.0000 mL | Freq: Four times a day (QID) | ORAL | 0 refills | Status: DC | PRN

## 2019-08-21 NOTE — Telephone Encounter (Signed)
Called Dr. Omelia Blackwater office and got transferred to nurse but nurse never picked up.

## 2019-08-21 NOTE — Progress Notes (Signed)
Subjective:     Patient ID: Cheyenne Alvarez, female   DOB: March 15, 1965, 54 y.o.   MRN: [id number]  This visit type was conducted due to national recommendations for restrictions regarding the COVID-19 Pandemic (e.g. social distancing) in an effort to limit this patient's exposure and mitigate transmission in our community.  Due to their co-morbid illnesses, this patient is at least at moderate risk for complications without adequate follow up.  This format is felt to be most appropriate for this patient at this time.    Documentation for virtual audio and video telecommunications through Zoom encounter:  The patient was located at home. The provider was located in the office. The patient did consent to this visit and is aware of possible charges through their insurance for this visit.  The other persons participating in this telemedicine service were none. Time spent on call was 20 minutes and in review of previous records 20 minutes total.  This virtual service is not related to other E/M service within previous 7 days.   HPI Chief Complaint  Patient presents with  . Cough    wants x-ray to see if she still has pneumonia    Virtual consult today for cough.  We did a virtual visit June 7 for the same.  She ended up going to the emergency department on June 9, x-ray showed potential area of pneumonia.  They continued her on the doxycycline antibiotic, gave her other advice.  At this point she still notes coughing, sneezing, still has some thick mucus.  At times feels little wheezy.  No fever, no sweats, no chest pain.  Using Tylenol for pain.  She would like something stronger called out for cough and over-the-counter cough medicine or Promethazine DM for last visit in the past. she has tolerated codeine cough syrups.  She is a former -smoker.  She quit 2 years ago.    She notes that her fluid intake is good.  She does not have a big appetite right now.  She is having trouble getting  her mental health medications from Dr. Omelia Blackwater.  Past Medical History:  Diagnosis Date  . Alopecia   . Anemia   . Anxiety   . Bipolar 1 disorder (HCC)   . COPD (chronic obstructive pulmonary disease) (HCC)   . Coronary artery disease   . Depression   . Diabetes mellitus 2010  . Gastritis   . GERD (gastroesophageal reflux disease)   . Headache    migraines  . Hypertension 2010  . Obesity   . Pneumonia   . PONV (postoperative nausea and vomiting)   . PTSD (post-traumatic stress disorder)   . Schizophrenia (HCC)   . Tobacco use    Current Outpatient Medications on File Prior to Visit  Medication Sig Dispense Refill  . albuterol (VENTOLIN HFA) 108 (90 Base) MCG/ACT inhaler INHALE 2 PUFFS INTO THE LUNGS EVERY 6 HOURS AS NEEDED FOR WHEEZING (Patient taking differently: Inhale 2 puffs into the lungs every 6 (six) hours as needed for wheezing or shortness of breath. INHALE 2 PUFFS INTO THE LUNGS EVERY 6 HOURS AS NEEDED FOR WHEEZING) 54 g 0  . amantadine (SYMMETREL) 100 MG capsule Take 100 mg by mouth 2 (two) times daily. FOR TREMORS  0  . amLODipine (NORVASC) 10 MG tablet TAKE 1 TABLET(10 MG) BY MOUTH DAILY (Patient taking differently: Take 10 mg by mouth daily. TAKE 1 TABLET(10 MG) BY MOUTH DAILY) 90 tablet 1  . atenolol (TENORMIN) 50 MG tablet TAKE  1 TABLET(50 MG) BY MOUTH TWICE DAILY (Patient taking differently: Take 50 mg by mouth 2 (two) times daily. ) 180 tablet 3  . Dulaglutide (TRULICITY) 5.73 UK/0.2RK SOPN Inject 0.75 mg into the skin once a week. 4 pen 4  . Fluticasone-Salmeterol (ADVAIR DISKUS) 250-50 MCG/DOSE AEPB Inhale 1 puff into the lungs 2 (two) times daily. 60 each 5  . fluvoxaMINE (LUVOX) 25 MG tablet Take 25 mg by mouth 2 (two) times daily.    . insulin aspart protamine - aspart (NOVOLOG MIX 70/30 FLEXPEN) (70-30) 100 UNIT/ML FlexPen Inject 0.44 mLs (44 Units total) into the skin 2 (two) times daily with a meal. (Patient taking differently: Inject 52 Units into the skin in  the morning, at noon, and at bedtime. ) 27 mL 11  . Insulin Pen Needle (BD PEN NEEDLE NANO U/F) 32G X 4 MM MISC USE TWICE DAILY 100 each 11  . INVEGA TRINZA 819 MG/2.625ML SUSP Inject 819 mg into the skin daily.   0  . losartan-hydrochlorothiazide (HYZAAR) 100-25 MG tablet TAKE 1 TABLET BY MOUTH DAILY 90 tablet 0  . loxapine (LOXITANE) 25 MG capsule Take 25 mg by mouth at bedtime.   1  . metFORMIN (GLUCOPHAGE-XR) 500 MG 24 hr tablet TAKE 2 TABLETS BY MOUTH DAILY WITH BREAKFAST AND TAKE 1 TABLET DAILY WITH SUPPER (Patient taking differently: Take 1,000 mg by mouth daily with breakfast. ) 90 tablet 0  . ondansetron (ZOFRAN ODT) 4 MG disintegrating tablet Take 1 tablet (4 mg total) by mouth every 8 (eight) hours as needed for nausea or vomiting. 6 tablet 0  . pantoprazole (PROTONIX) 40 MG tablet TAKE 1 TABLET BY MOUTH TWICE DAILY 180 tablet 0  . promethazine-dextromethorphan (PROMETHAZINE-DM) 6.25-15 MG/5ML syrup Take 5 mLs by mouth 4 (four) times daily as needed for cough. 120 mL 0  . rosuvastatin (CRESTOR) 20 MG tablet Take 1 tablet (20 mg total) by mouth daily. 90 tablet 3  . tiagabine (GABITRIL) 12 MG tablet Take 12 mg by mouth at bedtime.    Marland Kitchen VRAYLAR capsule Take 1.5 mg by mouth at bedtime.   1  . AUSTEDO 6 MG TABS Take 6 mg by mouth 2 (two) times daily.  0  . benztropine (COGENTIN) 2 MG tablet Take 2 mg by mouth 2 (two) times daily. FOR TREMORS AND MOUTH MOVEMENTS  0  . busPIRone (BUSPAR) 15 MG tablet Take 15 mg by mouth 2 (two) times daily. Reported on 04/20/2015    . diphenhydrAMINE (BENADRYL) 25 mg capsule Take 50 mg by mouth at bedtime. Reported on 04/07/2015    . doxepin (SINEQUAN) 10 MG capsule Take 10 mg by mouth at bedtime.     Marland Kitchen doxycycline (VIBRA-TABS) 100 MG tablet Take 1 tablet (100 mg total) by mouth 2 (two) times daily. 20 tablet 0  . FANAPT 6 MG TABS Take 6 mg by mouth daily.   0  . fluconazole (DIFLUCAN) 150 MG tablet 1 tablet weekly (Patient not taking: Reported on 08/07/2019) 3  tablet 0  . FLUoxetine (PROZAC) 40 MG capsule Take 40 mg by mouth daily.  0  . glucose blood test strip 1 each by Other route 3 (three) times daily. Use as instructed 100 each 11  . nitroGLYCERIN (NITROSTAT) 0.4 MG SL tablet Place 0.4 mg under the tongue every 5 (five) minutes as needed for chest pain. Reported on 07/15/2015 (Patient not taking: Reported on 08/21/2019)     No current facility-administered medications on file prior to visit.  Review of Systems As in subjective    Objective:   Physical Exam Due to coronavirus pandemic stay at home measures, patient visit was virtual and they were not examined in person.   BP (!) 145/82   Pulse 89   Temp 97.8 F (36.6 C)   Ht 5\' 4"  (1.626 m)   Wt 220 lb (99.8 kg)   BMI 37.76 kg/m   She was able to verify her vitals at home      Assessment:     Encounter Diagnoses  Name Primary?  . Pneumonia due to infectious organism, unspecified laterality, unspecified part of lung Yes  . Cough   . Chronic obstructive pulmonary disease, unspecified COPD type (HCC)   . Uncontrolled type 2 diabetes mellitus with hyperglycemia (HCC)   . Schizoaffective disorder, bipolar type [facility])        Plan:     I reviewed her visits from June 7 with me, and the June 9 emergency department visit.  She was diagnosed with pneumonia and did a round of doxycycline.  I recommended she come in for in person eval today so I can listen to her lungs, get vital signs,, consider other evaluation treatment.  She is unable to come in today given lack of transportation, and she says she doesn't feel extremely ill.   I advise that it is hard to make a complete assessment without listen to her lungs and getting some basic information.  Given the ongoing coughing though which could be residual inflammation or lingering infection, I will have her use the medication as below, rest, hydrate well.  I asked her to call back in a few days to give me an update.  If worsening in  the meantime she will need go to the emergency department  We will call Dr. [id number] office to make sure they are aware she is having trouble getting her medications from them   Mckinley was seen today for cough.  Diagnoses and all orders for this visit:  Pneumonia due to infectious organism, unspecified laterality, unspecified part of lung  Cough  Chronic obstructive pulmonary disease, unspecified COPD type (HCC)  Uncontrolled type 2 diabetes mellitus with hyperglycemia (HCC)  Schizoaffective disorder, bipolar type (HCC)  Other orders -     Promethazine-Codeine 6.25-10 MG/5ML SOLN; Take 5 mLs by mouth every 6 (six) hours as needed. -     clarithromycin (BIAXIN) 500 MG tablet; Take 1 tablet (500 mg total) by mouth 2 (two) times daily.  call report in 3-4 days

## 2019-08-21 NOTE — Telephone Encounter (Signed)
Please call the psychiatry office for Dr. Omelia Blackwater.  [phone]  Ms. Pluta notes that she has not been able to get her mental health medicines since April.  Not sure what plan they have at that office for her, but I want to make sure they are aware she is without her medications that she badly needs.

## 2019-08-22 NOTE — Telephone Encounter (Signed)
Try again Thursday morning.

## 2019-08-26 NOTE — Telephone Encounter (Signed)
Called Dr. Omelia Blackwater office again and they were in a meeting so left a message for someone to call me back.

## 2019-08-27 ENCOUNTER — Telehealth: Payer: Self-pay | Admitting: Medical

## 2019-08-27 NOTE — Telephone Encounter (Signed)
I called and spoke to the office manager at Dr. Lilia Pro office about her mental health medications per their records sure refills are up-to-date and she has an appointment coming up soon for regular med check with them.  She will reach out to patient and make sure she is getting her medications.  I did note that she has both [facility] in [place] pharmacy listed

## 2019-08-27 NOTE — Telephone Encounter (Signed)
Print production planner called back and states pt taking all of her psy medicines, she is going to send over a fax states pt is getting all of her medicine that she should be getting,

## 2019-08-29 ENCOUNTER — Other Ambulatory Visit: Payer: Self-pay | Admitting: Internal Medicine

## 2019-08-29 MED ORDER — INSULIN LISPRO PROT & LISPRO (75-25 MIX) 100 UNIT/ML KWIKPEN: 52.0000 [IU] | PEN_INJECTOR | Freq: Two times a day (BID) | SUBCUTANEOUS | 6 refills | Status: DC

## 2019-09-06 ENCOUNTER — Ambulatory Visit: Payer: Medicare Other | Admitting: Internal Medicine

## 2019-09-15 ENCOUNTER — Other Ambulatory Visit: Payer: Self-pay | Admitting: Medical

## 2019-09-16 ENCOUNTER — Other Ambulatory Visit: Payer: Self-pay | Admitting: Medical

## 2019-10-03 ENCOUNTER — Other Ambulatory Visit: Payer: Self-pay | Admitting: Medical

## 2019-10-14 ENCOUNTER — Other Ambulatory Visit: Payer: Self-pay

## 2019-10-14 ENCOUNTER — Encounter: Payer: Self-pay | Admitting: Medical

## 2019-10-14 ENCOUNTER — Ambulatory Visit (INDEPENDENT_AMBULATORY_CARE_PROVIDER_SITE_OTHER): Payer: Medicare Other | Admitting: Medical

## 2019-10-14 VITALS — BP 118/70 | HR 91 | Ht 64.0 in | Wt 226.6 lb

## 2019-10-14 DIAGNOSIS — F319 Bipolar disorder, unspecified: Secondary | ICD-10-CM

## 2019-10-14 DIAGNOSIS — Z789 Other specified health status: Secondary | ICD-10-CM

## 2019-10-14 DIAGNOSIS — I10 Essential (primary) hypertension: Secondary | ICD-10-CM

## 2019-10-14 DIAGNOSIS — T464X5D Adverse effect of angiotensin-converting-enzyme inhibitors, subsequent encounter: Secondary | ICD-10-CM

## 2019-10-14 DIAGNOSIS — K429 Umbilical hernia without obstruction or gangrene: Secondary | ICD-10-CM

## 2019-10-14 DIAGNOSIS — E1165 Type 2 diabetes mellitus with hyperglycemia: Secondary | ICD-10-CM

## 2019-10-14 DIAGNOSIS — E669 Obesity, unspecified: Secondary | ICD-10-CM

## 2019-10-14 DIAGNOSIS — K219 Gastro-esophageal reflux disease without esophagitis: Secondary | ICD-10-CM

## 2019-10-14 DIAGNOSIS — J449 Chronic obstructive pulmonary disease, unspecified: Secondary | ICD-10-CM

## 2019-10-14 DIAGNOSIS — R251 Tremor, unspecified: Secondary | ICD-10-CM

## 2019-10-14 DIAGNOSIS — F411 Generalized anxiety disorder: Secondary | ICD-10-CM

## 2019-10-14 DIAGNOSIS — D72829 Elevated white blood cell count, unspecified: Secondary | ICD-10-CM | POA: Insufficient documentation

## 2019-10-14 DIAGNOSIS — Z79899 Other long term (current) drug therapy: Secondary | ICD-10-CM

## 2019-10-14 DIAGNOSIS — Z124 Encounter for screening for malignant neoplasm of cervix: Secondary | ICD-10-CM | POA: Insufficient documentation

## 2019-10-14 DIAGNOSIS — E785 Hyperlipidemia, unspecified: Secondary | ICD-10-CM

## 2019-10-14 DIAGNOSIS — F250 Schizoaffective disorder, bipolar type: Secondary | ICD-10-CM

## 2019-10-14 DIAGNOSIS — Z0000 Encounter for general adult medical examination without abnormal findings: Secondary | ICD-10-CM

## 2019-10-14 DIAGNOSIS — Z2820 Immunization not carried out because of patient decision for unspecified reason: Secondary | ICD-10-CM

## 2019-10-14 DIAGNOSIS — F4310 Post-traumatic stress disorder, unspecified: Secondary | ICD-10-CM

## 2019-10-14 DIAGNOSIS — Z7189 Other specified counseling: Secondary | ICD-10-CM

## 2019-10-14 DIAGNOSIS — Z2821 Immunization not carried out because of patient refusal: Secondary | ICD-10-CM

## 2019-10-14 DIAGNOSIS — Z7185 Encounter for immunization safety counseling: Secondary | ICD-10-CM

## 2019-10-14 DIAGNOSIS — R419 Unspecified symptoms and signs involving cognitive functions and awareness: Secondary | ICD-10-CM

## 2019-10-14 DIAGNOSIS — R32 Unspecified urinary incontinence: Secondary | ICD-10-CM

## 2019-10-14 DIAGNOSIS — D509 Iron deficiency anemia, unspecified: Secondary | ICD-10-CM

## 2019-10-14 DIAGNOSIS — E8941 Symptomatic postprocedural ovarian failure: Secondary | ICD-10-CM

## 2019-10-14 DIAGNOSIS — T783XXD Angioneurotic edema, subsequent encounter: Secondary | ICD-10-CM

## 2019-10-14 DIAGNOSIS — Z87891 Personal history of nicotine dependence: Secondary | ICD-10-CM

## 2019-10-14 DIAGNOSIS — Z1211 Encounter for screening for malignant neoplasm of colon: Secondary | ICD-10-CM

## 2019-10-14 NOTE — Patient Instructions (Signed)
Recommendations:  Call Dr. Quin Hoop office to reschedule for diabetes follow up. Phone: [phone]  Call Dr. Gayland Curry, gynecology office to reschedule yealry gynecology visit Phone: [phone]   Vaccines: I strongly recommend you have vaccines for pneumonia, yearly flu shot, shingles vaccine.  You are due for all of these.  You are at high risk of hospitalization from pneumonia and flu which is why these are recommended.  If you decide to do these call our office and we will set up a time to schedule some vaccines   We are sending you home with a stool kit today to screen for blood in the stool given that you are due for intermediate colon cancer screening as well as your labs show anemia.   Please schedule a visit with your dentist and eye doctor which she should be doing yearly   If you do not have an eye doctor dentist, I have listed some below   EYE DOCTOR RECOMMENDATIONS   Dr. Webb Laws [place], [place], [place] [address] [phone]   [facility] Dr. Camillo Flaming [address], [place], [place] [address]  [email or web address]    There are 2 dentist offices on the same street that we are on including 1 dentist next door.  Check with these for appointment

## 2019-10-14 NOTE — Progress Notes (Signed)
Subjective:    Cheyenne Alvarez is a 54 y.o. female who presents for Preventative Services visit and chronic medical problems/med check visit.    Primary Care Provider Kathye Cipriani, Kermit Baloavid S, PA-C here for primary care  Current Health Care Team: Dr. Lonzo CloudShamleffer, endocrinology Dr. Raelyn Moraolitta Dawson, CNM with gynecology Dr. Sharyn LullHarwani, cardiology Dr. Omelia BlackwaterHeaden, psychiatry Dr. Charlott RakesVincent Schooler, GI Dentist No eye doctor  Medical Services you may have received from other than [facility] providers in the past year (date may be approximate) psychiatry  Exercise Current exercise habits: none  Nutrition/Diet Current diet: no specific response  Depression Screen Depression screen [facility] 2[facility] 10/14/2019  Decreased Interest 3  Down, Depressed, Hopeless 3  PHQ - 2 Score 6  Altered sleeping 3  Tired, decreased energy 3  Change in appetite 0  Feeling bad or failure about yourself  0  Trouble concentrating 0  Moving slowly or fidgety/restless 0  Suicidal thoughts 0  PHQ-9 Score 12  Difficult doing work/chores Extremely dIfficult    Activities of Daily Living Screen/Functional Status Survey Is the patient deaf or have difficulty hearing?: No Does the patient have difficulty seeing, even when wearing glasses/contacts?: No Does the patient have difficulty concentrating, remembering, or making decisions?: Yes Does the patient have difficulty walking or climbing stairs?: No Does the patient have difficulty dressing or bathing?: Yes (has an assistant)   Fall Risk Screen Fall Risk  10/14/2019 07/03/2018 07/01/2016 07/01/2016 11/26/2014  Falls in the past year? 0 0 No No No  Number falls in past yr: - - - - -  Injury with Fall? - - - - -  Risk Factor Category  - - - - -  Risk for fall due to : - - - - -  Follow up - Falls evaluation completed - - -    Gait Assessment: Normal gait observed - yes  Advanced directives Does patient have a Health Care Power of Attorney? no Does patient have a Living Will?  no  Past Medical History:  Diagnosis Date  . Alopecia   . Anemia   . Anxiety   . Bipolar 1 disorder (HCC)   . COPD (chronic obstructive pulmonary disease) (HCC)   . Coronary artery disease   . Depression   . Diabetes mellitus 2010  . Gastritis   . GERD (gastroesophageal reflux disease)   . Headache    migraines  . Hypertension 2010  . Obesity   . Pneumonia   . PONV (postoperative nausea and vomiting)   . PTSD (post-traumatic stress disorder)   . Schizophrenia (HCC)   . Tobacco use     Past Surgical History:  Procedure Laterality Date  . ABDOMINAL HYSTERECTOMY    . CARDIAC CATHETERIZATION    . CHOLECYSTECTOMY    . COLONOSCOPY  05/14/14   hemorrhoids, otherwise normal; Dr. Charlott RakesVincent Schooler  . COLONOSCOPY WITH PROPOFOL N/A 05/14/2014   Procedure: COLONOSCOPY WITH PROPOFOL;  Surgeon: Charlott RakesVincent Schooler, MD;  [facility];  Service: Endoscopy;  Laterality: N/A;  . ESOPHAGOGASTRODUODENOSCOPY (EGD) WITH PROPOFOL N/A 05/14/2014   Procedure: ESOPHAGOGASTRODUODENOSCOPY (EGD) WITH PROPOFOL;  Surgeon: Charlott RakesVincent Schooler, MD;  [facility];  Service: Endoscopy;  Laterality: N/A;  . FRACTURE SURGERY     left leg  . LEG SURGERY    . TUBAL LIGATION       Family History  Problem Relation Age of Onset  . Diabetes Mother   . Kidney failure Mother   . Diabetes Other   . Cancer Other   .  Hypertension Other   . Breast cancer Sister 22  . Breast cancer Maternal Grandmother      Current Outpatient Medications:  .  albuterol (VENTOLIN HFA) 108 (90 Base) MCG/ACT inhaler, Inhale 2 puffs into the lungs every 6 (six) hours as needed for wheezing or shortness of breath. INHALE 2 PUFFS INTO THE LUNGS EVERY 6 HOURS AS NEEDED FOR WHEEZING, Disp: 18 g, Rfl: 0 .  amantadine (SYMMETREL) 100 MG capsule, Take 100 mg by mouth 2 (two) times daily. FOR TREMORS, Disp: , Rfl: 0 .  amLODipine (NORVASC) 10 MG tablet, TAKE 1 TABLET(10 MG) BY MOUTH DAILY (Patient taking differently: Take 10  mg by mouth daily. TAKE 1 TABLET(10 MG) BY MOUTH DAILY), Disp: 90 tablet, Rfl: 1 .  atenolol (TENORMIN) 50 MG tablet, TAKE 1 TABLET(50 MG) BY MOUTH TWICE DAILY (Patient taking differently: Take 50 mg by mouth 2 (two) times daily. ), Disp: 180 tablet, Rfl: 3 .  AUSTEDO 6 MG TABS, Take 6 mg by mouth 2 (two) times daily., Disp: , Rfl: 0 .  benztropine (COGENTIN) 2 MG tablet, Take 2 mg by mouth 2 (two) times daily. FOR TREMORS AND MOUTH MOVEMENTS, Disp: , Rfl: 0 .  busPIRone (BUSPAR) 15 MG tablet, Take 15 mg by mouth 2 (two) times daily. Reported on 04/20/2015, Disp: , Rfl:  .  diphenhydrAMINE (BENADRYL) 25 mg capsule, Take 50 mg by mouth at bedtime. Reported on 04/07/2015, Disp: , Rfl:  .  doxepin (SINEQUAN) 10 MG capsule, Take 10 mg by mouth at bedtime. , Disp: , Rfl:  .  doxycycline (VIBRA-TABS) 100 MG tablet, Take 1 tablet (100 mg total) by mouth 2 (two) times daily., Disp: 20 tablet, Rfl: 0 .  Dulaglutide (TRULICITY) 0.75 MG/0.5ML SOPN, Inject 0.75 mg into the skin once a week., Disp: 4 pen, Rfl: 4 .  FANAPT 6 MG TABS, Take 6 mg by mouth daily. , Disp: , Rfl: 0 .  FLUoxetine (PROZAC) 40 MG capsule, Take 40 mg by mouth daily., Disp: , Rfl: 0 .  Fluticasone-Salmeterol (ADVAIR DISKUS) 250-50 MCG/DOSE AEPB, Inhale 1 puff into the lungs 2 (two) times daily., Disp: 60 each, Rfl: 5 .  fluvoxaMINE (LUVOX) 25 MG tablet, Take 25 mg by mouth 2 (two) times daily., Disp: , Rfl:  .  glucose blood test strip, 1 each by Other route 3 (three) times daily. Use as instructed, Disp: 100 each, Rfl: 11 .  Insulin Lispro Prot & Lispro (HUMALOG MIX 75/25 KWIKPEN) (75-25) 100 UNIT/ML Kwikpen, Inject 52 Units into the skin in the morning and at bedtime., Disp: 30 mL, Rfl: 6 .  Insulin Pen Needle (BD PEN NEEDLE NANO U/F) 32G X 4 MM MISC, USE TWICE DAILY, Disp: 100 each, Rfl: 11 .  INVEGA TRINZA 819 MG/2.625ML SUSP, Inject 819 mg into the skin daily. , Disp: , Rfl: 0 .  losartan-hydrochlorothiazide (HYZAAR) 100-25 MG tablet,  TAKE 1 TABLET BY MOUTH DAILY, Disp: 90 tablet, Rfl: 0 .  loxapine (LOXITANE) 25 MG capsule, Take 25 mg by mouth at bedtime. , Disp: , Rfl: 1 .  metFORMIN (GLUCOPHAGE-XR) 500 MG 24 hr tablet, TAKE 2 TABLETS BY MOUTH DAILY WITH BREAKFAST AND TAKE 1 TABLET DAILY WITH SUPPER (Patient taking differently: Take 1,000 mg by mouth daily with breakfast. ), Disp: 90 tablet, Rfl: 0 .  ondansetron (ZOFRAN ODT) 4 MG disintegrating tablet, Take 1 tablet (4 mg total) by mouth every 8 (eight) hours as needed for nausea or vomiting., Disp: 6 tablet, Rfl: 0 .  pantoprazole (PROTONIX) 40 MG tablet, TAKE 1 TABLET BY MOUTH TWICE DAILY, Disp: 180 tablet, Rfl: 0 .  rosuvastatin (CRESTOR) 20 MG tablet, Take 1 tablet (20 mg total) by mouth daily., Disp: 90 tablet, Rfl: 3 .  tiagabine (GABITRIL) 12 MG tablet, Take 12 mg by mouth at bedtime., Disp: , Rfl:  .  VRAYLAR capsule, Take 1.5 mg by mouth at bedtime. , Disp: , Rfl: 1 .  clarithromycin (BIAXIN) 500 MG tablet, Take 1 tablet (500 mg total) by mouth 2 (two) times daily. (Patient not taking: Reported on 10/14/2019), Disp: 20 tablet, Rfl: 0 .  fluconazole (DIFLUCAN) 150 MG tablet, 1 tablet weekly (Patient not taking: Reported on 08/07/2019), Disp: 3 tablet, Rfl: 0 .  nitroGLYCERIN (NITROSTAT) 0.4 MG SL tablet, Place 0.4 mg under the tongue every 5 (five) minutes as needed for chest pain. Reported on 07/15/2015 (Patient not taking: Reported on 08/21/2019), Disp: , Rfl:  .  Promethazine-Codeine 6.25-10 MG/5ML SOLN, Take 5 mLs by mouth every 6 (six) hours as needed. (Patient not taking: Reported on 10/14/2019), Disp: 120 mL, Rfl: 0 .  promethazine-dextromethorphan (PROMETHAZINE-DM) 6.25-15 MG/5ML syrup, Take 5 mLs by mouth 4 (four) times daily as needed for cough. (Patient not taking: Reported on 10/14/2019), Disp: 120 mL, Rfl: 0  Allergies  Allergen Reactions  . Dexilant [Dexlansoprazole] Shortness Of Breath, Diarrhea, Nausea And Vomiting and Other (See Comments)    Chest pain and  abdominal pain (also)  . Famotidine Anaphylaxis  . Meperidine Hcl Anaphylaxis  . Dilaudid [Hydromorphone Hcl] Other (See Comments)    Change in mental state  . Gabapentin Other (See Comments)    Memory loss  . Hydrocodone Nausea And Vomiting  . Ramipril Swelling and Other (See Comments)    Angioedema   . Ziprasidone Hcl Other (See Comments)    Caused convulsions  . Invokana [Canagliflozin] Rash  . Iohexol Itching and Rash  . Tape Rash    Use paper tape only     History reviewed: allergies, current medications, past family history, past medical history, past social history, past surgical history and problem list  Chronic issues discussed: She reports compliance with medicaiton  She did speak with psychiatry recently after I called to make sure her medications were being refilled for mental health.   There had been concern that Dr. Omelia Blackwater wasn't refilling medication but psychiatry office confirmed her medications were being refilled.  Acute issues discussed: none  Objective:      Biometrics BP 118/70   Pulse 91   Ht 5\' 4"  (1.626 m)   Wt 226 lb 9.6 oz (102.8 kg)   SpO2 98%   BMI 38.90 kg/m   Cognitive Testing  Alert? Yes  Normal Appearance? Sits rocking back and forth, anxious as often typical in her visits  Oriented to person? Yes  Place? Yes   Time? Yes  Recall of three objects?  Yes  Displays appropriate judgment? questionable   General appearance: alert, no distress, WD/WN, African American female  Nutritional Status: Inadequate calore intake? no Loss of muscle mass? no Loss of fat beneath skin? no Localized or general edema? no Diminished functional status? no  Other pertinent exam: Neck: supple, no lymphadenopathy, no thyromegaly, no masses, no bruits Heart: RRR, normal S1, S2, no murmurs Lungs: CTA bilaterally, no wheezes, rhonchi, or rales Abdomen: +bs, soft, small reducible umbilical hernia, non tender, non distended, no masses, no hepatomegaly, no  splenomegaly Musculoskeletal: nontender, no swelling, no obvious deformity Extremities: no edema, no cyanosis, no clubbing  Pulses: 2+ symmetric, upper and lower extremities, normal cap refill Neurological: alert, oriented x 3, CN2-12 intact, strength normal upper extremities and lower extremities, sensation normal throughout, DTRs 2+ throughout, no cerebellar signs, gait normal Psychiatric: anxious, rocking back and forth, pleasant , flat affect, answers questions reasonably  Breast/gyn - deferred to gynecology  Diabetic Foot Exam - Simple   Simple Foot Form Diabetic Foot exam was performed with the following findings: Yes 10/15/2019  5:35 AM  Visual Inspection No deformities, no ulcerations, no other skin breakdown bilaterally: Yes Sensation Testing Intact to touch and monofilament testing bilaterally: Yes Pulse Check Posterior Tibialis and Dorsalis pulse intact bilaterally: Yes Comments       Assessment:   Encounter Diagnoses  Name Primary?  . Encounter for health maintenance examination in adult Yes  . Medicare annual wellness visit, subsequent   . Uncontrolled type 2 diabetes mellitus with hyperglycemia (HCC)   . Essential hypertension   . Chronic obstructive pulmonary disease, unspecified COPD type (HCC)   . Gastroesophageal reflux disease without esophagitis   . Obesity with serious comorbidity, unspecified classification, unspecified obesity type   . Bipolar affective disorder, remission status unspecified (HCC)   . Generalized anxiety disorder   . POST TRAUMATIC STRESS SYNDROME   . MENOPAUSE, SURGICAL   . Angiotensin converting enzyme inhibitor-aggravated angioedema, subsequent encounter   . Schizoaffective disorder, bipolar type (HCC)   . Decreased activities of daily living (ADL)   . Cognitive safety issue   . 23-polyvalent pneumococcal polysaccharide vaccine declined   . Vaccine refused by patient   . Iron deficiency anemia, unspecified iron deficiency anemia  type   . Hyperlipidemia, unspecified hyperlipidemia type   . High risk medication use   . Vaccine counseling   . Incontinence in female   . Former smoker   . Tremor   . Screen for colon cancer   . Screening for cervical cancer   . Leukocytosis, unspecified type   . Umbilical hernia without obstruction and without gangrene      Plan:   A preventative services visit was completed today.  During the course of the visit today, we discussed and counseled about appropriate screening and preventive services.  A health risk assessment was established today that included a review of current medications, allergies, social history, family history, medical and preventative health history, biometrics, and preventative screenings to identify potential safety concerns or impairments.  A personalized plan was printed today for your records and use.   Personalized health advice and education was given today to reduce health risks and promote self management and wellness.  Information regarding end of life planning was discussed today.  Cancer screens: Cancer screening Mammogram from 06/2019 reviewed, normal  Colonoscopy 04/2014 without polyps or worrisome lesions.    Pap smear - she reports up to date per gynecology although I don't have this record.   Chronic problems discussed today:  Diabetes - recent home health nurse screen shows HgbA1C 11.1%.  I gave her phone number today to make follow up appt with endocrinology for diabetes management  Obesity - counseled on need to lose weight  Bipolar, anxiety, PTSD, schizoaffective disorder - managed by psychiatry who recently confirmed to me she was compliant with medications  hyperlipidemia- labs today, c/t same medication Crestor  Incontinence - uses support diapers, has daily nurse aid that comes out to the house  umbilical hernia - reassured  Leukocytosis on prior labs, recheck today  HTN - c/t same medication  Anemia - recheck  labs  today, advised stool cards vs referral back to GI  COPD - seems to be stable on current medicaiton  Lives alone, nurse aid comes out for 8 hours daily, family checks on her regularly   Acute problems discussed today: none  Recommendations:  I recommend a yearly ophthalmology/optometry visit for glaucoma screening and eye checkup  I recommended a yearly dental visit for hygiene and checkup  Advanced directives - discussed nature and purpose of Advanced Directives, encouraged them to complete them if they have not done so and/or encouraged them to get [place] a copy if they have done this already.   Referrals today: none  Immunizations: I recommended a yearly influenza vaccine, typically in September when the vaccine is usually available I recommend Shingrix, td and pneumococcal vaccines but she declines all of these today despite the fact she recently go the covid vaccine.  So I am not sure her logic in the denial.  We discussed her risks.   [place] was seen today for medicare wellness and medication refill.  Diagnoses and all orders for this visit:  Encounter for health maintenance examination in adult -     CBC with Differential/Platelet -     Iron -     Ferritin -     Lipid panel -     Microalbumin/Creatinine Ratio, Urine  Medicare annual wellness visit, subsequent  Uncontrolled type 2 diabetes mellitus with hyperglycemia (HCC) -     Microalbumin/Creatinine Ratio, Urine  Essential hypertension -     Microalbumin/Creatinine Ratio, Urine  Chronic obstructive pulmonary disease, unspecified COPD type (HCC)  Gastroesophageal reflux disease without esophagitis  Obesity with serious comorbidity, unspecified classification, unspecified obesity type  Bipolar affective disorder, remission status unspecified (HCC)  Generalized anxiety disorder  POST TRAUMATIC STRESS SYNDROME  MENOPAUSE, SURGICAL  Angiotensin converting enzyme inhibitor-aggravated angioedema, subsequent  encounter  Schizoaffective disorder, bipolar type (HCC)  Decreased activities of daily living (ADL)  Cognitive safety issue  23-polyvalent pneumococcal polysaccharide vaccine declined  Vaccine refused by patient  Iron deficiency anemia, unspecified iron deficiency anemia type -     CBC with Differential/Platelet -     Iron -     Ferritin  Hyperlipidemia, unspecified hyperlipidemia type -     Lipid panel  High risk medication use  Vaccine counseling  Incontinence in female  Former smoker  Tremor  Screen for colon cancer  Screening for cervical cancer  Leukocytosis, unspecified type  Umbilical hernia without obstruction and without gangrene    Medicare Attestation A preventative services visit was completed today.  During the course of the visit the patient was educated and counseled about appropriate screening and preventive services.  A health risk assessment was established with the patient that included a review of current medications, allergies, social history, family history, medical and preventative health history, biometrics, and preventative screenings to identify potential safety concerns or impairments.  A personalized plan was printed today for the patient's records and use.   Personalized health advice and education was given today to reduce health risks and promote self management and wellness.  Information regarding end of life planning was discussed today.  Kristian Covey, PA-C   10/15/2019

## 2019-10-15 ENCOUNTER — Other Ambulatory Visit: Payer: Self-pay | Admitting: Internal Medicine

## 2019-10-15 DIAGNOSIS — K429 Umbilical hernia without obstruction or gangrene: Secondary | ICD-10-CM | POA: Insufficient documentation

## 2019-10-15 LAB — IRON: Iron: 43 ug/dL (ref 27–159)

## 2019-10-15 LAB — CBC WITH DIFFERENTIAL/PLATELET
Basophils Absolute: 0 10*3/uL (ref 0.0–0.2)
Basos: 0 %
EOS (ABSOLUTE): 0.5 10*3/uL — ABNORMAL HIGH (ref 0.0–0.4)
Eos: 3 %
Hematocrit: 35.4 % (ref 34.0–46.6)
Hemoglobin: 11.6 g/dL (ref 11.1–15.9)
Immature Grans (Abs): 0.1 10*3/uL (ref 0.0–0.1)
Immature Granulocytes: 1 %
Lymphocytes Absolute: 3.4 10*3/uL — ABNORMAL HIGH (ref 0.7–3.1)
Lymphs: 24 %
MCH: 26 pg — ABNORMAL LOW (ref 26.6–33.0)
MCHC: 32.8 g/dL (ref 31.5–35.7)
MCV: 79 fL (ref 79–97)
Monocytes Absolute: 0.7 10*3/uL (ref 0.1–0.9)
Monocytes: 5 %
Neutrophils Absolute: 9.5 10*3/uL — ABNORMAL HIGH (ref 1.4–7.0)
Neutrophils: 67 %
Platelets: 502 10*3/uL — ABNORMAL HIGH (ref 150–450)
RBC: 4.47 x10E6/uL (ref 3.77–5.28)
RDW: 15.8 % — ABNORMAL HIGH (ref 11.7–15.4)
WBC: 14.2 10*3/uL — ABNORMAL HIGH (ref 3.4–10.8)

## 2019-10-15 LAB — MICROALBUMIN / CREATININE URINE RATIO
Creatinine, Urine: 217.3 mg/dL
Microalb/Creat Ratio: 72 mg/g creat — ABNORMAL HIGH (ref 0–29)
Microalbumin, Urine: 156.1 ug/mL

## 2019-10-15 LAB — FERRITIN: Ferritin: 266 ng/mL — ABNORMAL HIGH (ref 15–150)

## 2019-10-15 LAB — LIPID PANEL
Chol/HDL Ratio: 3.3 ratio (ref 0.0–4.4)
Cholesterol, Total: 186 mg/dL (ref 100–199)
HDL: 56 mg/dL (ref >39)
LDL Chol Calc (NIH): 111 mg/dL — ABNORMAL HIGH (ref 0–99)
Triglycerides: 105 mg/dL (ref 0–149)
VLDL Cholesterol Cal: 19 mg/dL (ref 5–40)

## 2019-10-15 NOTE — Progress Notes (Signed)
I called and requested notes from GYN and eye doctor they will be faxing over.

## 2019-10-17 ENCOUNTER — Other Ambulatory Visit: Payer: Self-pay | Admitting: Medical

## 2019-10-17 ENCOUNTER — Telehealth: Payer: Self-pay | Admitting: Medical

## 2019-10-17 MED ORDER — FLUTICASONE-SALMETEROL 250-50 MCG/DOSE IN AEPB: 1.0000 | INHALATION_SPRAY | Freq: Two times a day (BID) | RESPIRATORY_TRACT | 5 refills | Status: DC

## 2019-10-17 MED ORDER — ROSUVASTATIN CALCIUM 20 MG PO TABS: 20.0000 mg | ORAL_TABLET | Freq: Every day | ORAL | 3 refills | Status: DC

## 2019-10-17 MED ORDER — PANTOPRAZOLE SODIUM 40 MG PO TBEC: 40.0000 mg | DELAYED_RELEASE_TABLET | Freq: Two times a day (BID) | ORAL | 3 refills | Status: DC

## 2019-10-17 MED ORDER — ATENOLOL 50 MG PO TABS: 50.0000 mg | ORAL_TABLET | Freq: Two times a day (BID) | ORAL | 3 refills | Status: DC

## 2019-10-17 MED ORDER — AMLODIPINE BESYLATE 10 MG PO TABS: 10.0000 mg | ORAL_TABLET | Freq: Every day | ORAL | 3 refills | Status: DC

## 2019-10-17 NOTE — Telephone Encounter (Signed)
Okay then lets resolve this metric as complete since she has had a hysterectomy

## 2019-10-17 NOTE — Telephone Encounter (Signed)
Cheyenne Alvarez received a call from [facility] is response to a records request for pap. I have no record of requesting. They did states that pt's last pap is available in Epic which is from 2011. Pt has had a hysterectomy. Please update pt chart.

## 2019-10-18 ENCOUNTER — Other Ambulatory Visit: Payer: Self-pay | Admitting: Medical

## 2019-10-18 ENCOUNTER — Telehealth: Payer: Self-pay | Admitting: Medical

## 2019-10-18 MED ORDER — ALBUTEROL SULFATE HFA 108 (90 BASE) MCG/ACT IN AERS: 2.0000 | INHALATION_SPRAY | Freq: Four times a day (QID) | RESPIRATORY_TRACT | 1 refills | Status: DC | PRN

## 2019-10-18 NOTE — Telephone Encounter (Signed)
Do not see notes for hysterectomy

## 2019-10-18 NOTE — Telephone Encounter (Signed)
Look back in gynecology records as they noted it in their record

## 2019-10-18 NOTE — Telephone Encounter (Signed)
Please review my last message  

## 2019-10-18 NOTE — Telephone Encounter (Signed)
Recv'd fax from [facility] that Albuterol not covered Albuterol HFA (Proair) is preferred.  Can you switch pt to this?

## 2019-10-22 NOTE — Telephone Encounter (Signed)
Seen in notes but no exact date. Is this exact date needed?

## 2019-10-26 NOTE — Telephone Encounter (Signed)
Called pharmacy they received new Rx generic and went thru insurance

## 2019-11-06 DIAGNOSIS — E119 Type 2 diabetes mellitus without complications: Secondary | ICD-10-CM | POA: Diagnosis not present

## 2019-11-06 DIAGNOSIS — E785 Hyperlipidemia, unspecified: Secondary | ICD-10-CM | POA: Diagnosis not present

## 2019-11-06 DIAGNOSIS — R0789 Other chest pain: Secondary | ICD-10-CM | POA: Diagnosis not present

## 2019-11-06 DIAGNOSIS — I10 Essential (primary) hypertension: Secondary | ICD-10-CM | POA: Diagnosis not present

## 2019-11-07 ENCOUNTER — Other Ambulatory Visit: Payer: Self-pay | Admitting: Medical

## 2019-11-10 ENCOUNTER — Other Ambulatory Visit: Payer: Self-pay | Admitting: Medical

## 2019-11-10 ENCOUNTER — Other Ambulatory Visit: Payer: Self-pay | Admitting: Internal Medicine

## 2019-11-28 ENCOUNTER — Encounter: Payer: Self-pay | Admitting: Medical

## 2019-12-10 ENCOUNTER — Other Ambulatory Visit: Payer: Self-pay | Admitting: Internal Medicine

## 2019-12-14 ENCOUNTER — Other Ambulatory Visit: Payer: Self-pay | Admitting: Medical

## 2019-12-25 ENCOUNTER — Other Ambulatory Visit: Payer: Self-pay | Admitting: Internal Medicine

## 2020-01-03 ENCOUNTER — Other Ambulatory Visit: Payer: Self-pay | Admitting: Internal Medicine

## 2020-01-08 LAB — HM DIABETES EYE EXAM

## 2020-01-09 ENCOUNTER — Telehealth: Payer: Self-pay

## 2020-01-09 ENCOUNTER — Encounter: Payer: Self-pay | Admitting: Medical

## 2020-01-09 ENCOUNTER — Other Ambulatory Visit: Payer: Self-pay | Admitting: Internal Medicine

## 2020-01-09 NOTE — Telephone Encounter (Signed)
Pt. Called LM stating she needs a refill on her Metformin pt. Last apt was 10/14/19 and next apt is 01/20/20.

## 2020-01-09 NOTE — Telephone Encounter (Signed)
Patient is seeing endo. Script was last filled 12/25/19 and they stated patient needed a follow up appointment for more refills. Provided patient the number to Dr. Lonzo Cloud office to schedule appointment. Patient is not out of medication.

## 2020-01-13 ENCOUNTER — Ambulatory Visit: Payer: Medicare Other | Admitting: Internal Medicine

## 2020-01-20 ENCOUNTER — Encounter: Payer: Self-pay | Admitting: Medical

## 2020-01-20 ENCOUNTER — Other Ambulatory Visit: Payer: Self-pay

## 2020-01-20 ENCOUNTER — Ambulatory Visit (INDEPENDENT_AMBULATORY_CARE_PROVIDER_SITE_OTHER): Payer: Medicare Other | Admitting: Medical

## 2020-01-20 ENCOUNTER — Telehealth: Payer: Self-pay | Admitting: Medical

## 2020-01-20 VITALS — BP 128/78 | HR 85 | Ht 64.0 in | Wt 228.0 lb

## 2020-01-20 DIAGNOSIS — Z87891 Personal history of nicotine dependence: Secondary | ICD-10-CM

## 2020-01-20 DIAGNOSIS — F411 Generalized anxiety disorder: Secondary | ICD-10-CM

## 2020-01-20 DIAGNOSIS — E785 Hyperlipidemia, unspecified: Secondary | ICD-10-CM | POA: Diagnosis not present

## 2020-01-20 DIAGNOSIS — Z741 Need for assistance with personal care: Secondary | ICD-10-CM

## 2020-01-20 DIAGNOSIS — I10 Essential (primary) hypertension: Secondary | ICD-10-CM

## 2020-01-20 DIAGNOSIS — E1165 Type 2 diabetes mellitus with hyperglycemia: Secondary | ICD-10-CM

## 2020-01-20 DIAGNOSIS — Z79899 Other long term (current) drug therapy: Secondary | ICD-10-CM

## 2020-01-20 DIAGNOSIS — D72829 Elevated white blood cell count, unspecified: Secondary | ICD-10-CM

## 2020-01-20 DIAGNOSIS — R8290 Unspecified abnormal findings in urine: Secondary | ICD-10-CM

## 2020-01-20 DIAGNOSIS — F250 Schizoaffective disorder, bipolar type: Secondary | ICD-10-CM

## 2020-01-20 LAB — POCT URINALYSIS DIP (PROADVANTAGE DEVICE)
Bilirubin, UA: NEGATIVE
Blood, UA: NEGATIVE
Glucose, UA: NEGATIVE mg/dL
Ketones, POC UA: NEGATIVE mg/dL
Leukocytes, UA: NEGATIVE
Nitrite, UA: POSITIVE — AB
Protein Ur, POC: NEGATIVE mg/dL
Specific Gravity, Urine: 1.02
Urobilinogen, Ur: 0.2
pH, UA: 6 (ref 5.0–8.0)

## 2020-01-20 LAB — POCT CBG (FASTING - GLUCOSE)-MANUAL ENTRY: Glucose Fasting, POC: 191 mg/dL — AB (ref 70–99)

## 2020-01-20 NOTE — Telephone Encounter (Signed)
error 

## 2020-01-20 NOTE — Progress Notes (Signed)
Subjective: Chief Complaint  Patient presents with  . Follow-up   Here for f/u on chronic issues.   Hasn't seen endocrinology yet since last visit here.   Sees Dr. Leilani Merl in 2 days  She states she ran out of metformin.  (per chart record, should have enough pills through this week, last filled by Dr. Lonzo Cloud 12/25/19 with #30 no refills).  She reports compliance with trulicity weekly, takes this on Sundays and compliance with humalog 75/25, uses 52u BID.   Having polyuria, dry mouth.   Sugars for weeks now running in the 400s.   Hx/o anemia.  Last visit improved.  No new bleeding, bruising.  Hx/o leukocytosis.  No recent fever, night sweats, weight loss, no swollen glands.  No changes in appetite.  No blood in stool, no blood in urine.  No cough.    S/p hysterectomy.  Sees psychiatry. Recently had to have some med changes as she was having delusions, seeing things and feeling off.    Her aid comes out regularly to help with ADLs.   Past Medical History:  Diagnosis Date  . Alopecia   . Anemia   . Anxiety   . Bipolar 1 disorder (HCC)   . COPD (chronic obstructive pulmonary disease) (HCC)   . Coronary artery disease   . Depression   . Diabetes mellitus 2010  . Gastritis   . GERD (gastroesophageal reflux disease)   . Headache    migraines  . Hypertension 2010  . Obesity   . Pneumonia   . PONV (postoperative nausea and vomiting)   . PTSD (post-traumatic stress disorder)   . Schizophrenia (HCC)   . Tobacco use    Current Outpatient Medications on File Prior to Visit  Medication Sig Dispense Refill  . albuterol (VENTOLIN HFA) 108 (90 Base) MCG/ACT inhaler INHALE 2 PUFFS INTO THE LUNGS EVERY 6 HOURS AS NEEDED FOR WHEEZING OR SHORTNESS OF BREATH 54 g 0  . amantadine (SYMMETREL) 100 MG capsule Take 100 mg by mouth 2 (two) times daily. FOR TREMORS  0  . amLODipine (NORVASC) 10 MG tablet Take 1 tablet (10 mg total) by mouth daily. TAKE 1 TABLET(10 MG) BY MOUTH DAILY 90  tablet 3  . atenolol (TENORMIN) 50 MG tablet Take 1 tablet (50 mg total) by mouth 2 (two) times daily. 180 tablet 3  . AUSTEDO 6 MG TABS Take 6 mg by mouth 2 (two) times daily.  0  . benztropine (COGENTIN) 2 MG tablet Take 2 mg by mouth 2 (two) times daily. FOR TREMORS AND MOUTH MOVEMENTS  0  . busPIRone (BUSPAR) 15 MG tablet Take 15 mg by mouth 2 (two) times daily. Reported on 04/20/2015    . clonazePAM (KLONOPIN) 0.5 MG tablet Take by mouth.    . doxepin (SINEQUAN) 10 MG capsule Take 10 mg by mouth at bedtime.     . Dulaglutide (TRULICITY) 0.75 MG/0.5ML SOPN ADMINISTER 0.75 MG UNDER THE SKIN 1 TIME WEEKLY overdue for follow up appointment 2 mL 0  . FANAPT 6 MG TABS Take 6 mg by mouth daily.   0  . FLUoxetine (PROZAC) 40 MG capsule Take 40 mg by mouth daily.  0  . Fluticasone-Salmeterol (ADVAIR DISKUS) 250-50 MCG/DOSE AEPB Inhale 1 puff into the lungs 2 (two) times daily. 60 each 5  . fluvoxaMINE (LUVOX) 25 MG tablet Take 25 mg by mouth 2 (two) times daily.    Marland Kitchen glucose blood test strip 1 each by Other route 3 (three) times daily. Use  as instructed 100 each 11  . Insulin Lispro Prot & Lispro (HUMALOG MIX 75/25 KWIKPEN) (75-25) 100 UNIT/ML Kwikpen Inject 52 Units into the skin in the morning and at bedtime. 30 mL 6  . Insulin Pen Needle (BD PEN NEEDLE NANO U/F) 32G X 4 MM MISC USE TWICE DAILY 100 each 11  . INVEGA TRINZA 819 MG/2.625ML SUSP Inject 819 mg into the skin daily.   0  . losartan-hydrochlorothiazide (HYZAAR) 100-25 MG tablet TAKE 1 TABLET BY MOUTH DAILY 90 tablet 0  . loxapine (LOXITANE) 25 MG capsule Take 25 mg by mouth at bedtime.   1  . metFORMIN (GLUCOPHAGE-XR) 500 MG 24 hr tablet TAKE 2 TABLETS BY MOUTH DAILY WITH BREAKFAST AND TAKE 1 TABLET DAILY WITH SUPPER 30 tablet 0  . ondansetron (ZOFRAN ODT) 4 MG disintegrating tablet Take 1 tablet (4 mg total) by mouth every 8 (eight) hours as needed for nausea or vomiting. 6 tablet 0  . pantoprazole (PROTONIX) 40 MG tablet Take 1 tablet  (40 mg total) by mouth 2 (two) times daily. 180 tablet 3  . rosuvastatin (CRESTOR) 20 MG tablet Take 1 tablet (20 mg total) by mouth daily. 90 tablet 3  . tiagabine (GABITRIL) 12 MG tablet Take 12 mg by mouth at bedtime.    Marland Kitchen VRAYLAR capsule Take 1.5 mg by mouth at bedtime.   1  . nitroGLYCERIN (NITROSTAT) 0.4 MG SL tablet Place 0.4 mg under the tongue every 5 (five) minutes as needed for chest pain. Reported on 07/15/2015 (Patient not taking: Reported on 08/21/2019)     No current facility-administered medications on file prior to visit.   ROS as in subjective    Objective: BP 128/78   Pulse 85   Ht 5\' 4"  (1.626 m)   Wt 228 lb (103.4 kg)   SpO2 98%   BMI 39.14 kg/m   Gen: wd, wn, nad Heart rrr, normal s1, s2, no murmurs Lungs clear Abdomen: +bs, soft, nontender, no mass, no organomegaly Back nontender Neck without lymphadenopathy, no mass, supple No ext edema     Assessment: Encounter Diagnoses  Name Primary?  Uncontrolled type 2 diabetes mellitus with hyperglycemia (HCC) Yes  . Essential hypertension   . Hyperlipidemia, unspecified hyperlipidemia type   . High risk medication use   . Generalized anxiety disorder   . Former smoker   . Leukocytosis, unspecified type   . Schizoaffective disorder, bipolar type (HCC)   . Requires daily assistance for activities of daily living (ADL) and comfort needs   . Abnormal urine odor       Plan Diabetes - uncontrolled.   Due back with endocrinology this week!  She was remained last visit with me of the need to have regular f/u with endocrinology.    Not sure why she is out of metformin just yet as she should have enough to get by until she sees endocrinology this week.  Glucose non fasting in office under 200 currently, UA normal. I gave her a dose/sample of 0.75mg  trulicity today in office subcutaneous to add to dose yesterday. She likely needs to be on higher dose.   Increase insulin 75/25 mix, 1 unit to 53 u BID.  HTN - c/t  current medication  hyperlipidemia - c/t statin  Anxiety, schizoaffective disorder - managed by psychiatry  Aid comes out daily to help with ADLs, ongoing  Urine odor - urine sent for culture  leukocytosis - pending labs, consider differential.  Urine culture sent.  Reviewed 11/2018  CT abdomen pelvis, reviewed chest xray from 07/2019.     Zissy was seen today for follow-up.  Diagnoses and all orders for this visit:  Uncontrolled type 2 diabetes mellitus with hyperglycemia (HCC) -     Hemoglobin A1c -     Comprehensive metabolic panel -     POCT Urinalysis DIP (Proadvantage Device)  Essential hypertension -     Comprehensive metabolic panel  Hyperlipidemia, unspecified hyperlipidemia type -     Comprehensive metabolic panel  High risk medication use -     Comprehensive metabolic panel  Generalized anxiety disorder -     Comprehensive metabolic panel  Former smoker -     Comprehensive metabolic panel  Leukocytosis, unspecified type -     CBC with Differential/Platelet -     Comprehensive metabolic panel -     POCT Urinalysis DIP (Proadvantage Device)  Schizoaffective disorder, bipolar type (HCC) -     Comprehensive metabolic panel  Requires daily assistance for activities of daily living (ADL) and comfort needs  Abnormal urine odor -     Urine Culture   F/ u with Dr. Lonzo Cloud this week, pending labs

## 2020-01-20 NOTE — Patient Instructions (Signed)
Given your recent high sugar readings, we did an extra Trulicity 0.75mg  today to add to the dose from yesterday.   You probably should be on higher dose now.  Increase the Humalog to 53 units twice daily  You should not be out of Metformin.  Per chart record, Dr. Lonzo Alvarez called this out a month ago and you should have enough pills to get through this week until you see her.   Be real careful not to indulge in sweets, soda, juice or junk food.  Avoid large portions of pasta, bread or rice.  follow up with Dr. Lonzo Alvarez as planned this week.

## 2020-01-21 LAB — CBC WITH DIFFERENTIAL/PLATELET
Basophils Absolute: 0 10*3/uL (ref 0.0–0.2)
Basos: 0 %
EOS (ABSOLUTE): 0.3 10*3/uL (ref 0.0–0.4)
Eos: 2 %
Hematocrit: 34.7 % (ref 34.0–46.6)
Hemoglobin: 11.4 g/dL (ref 11.1–15.9)
Immature Grans (Abs): 0.1 10*3/uL (ref 0.0–0.1)
Immature Granulocytes: 1 %
Lymphocytes Absolute: 3.2 10*3/uL — ABNORMAL HIGH (ref 0.7–3.1)
Lymphs: 25 %
MCH: 25.9 pg — ABNORMAL LOW (ref 26.6–33.0)
MCHC: 32.9 g/dL (ref 31.5–35.7)
MCV: 79 fL (ref 79–97)
Monocytes Absolute: 0.6 10*3/uL (ref 0.1–0.9)
Monocytes: 4 %
Neutrophils Absolute: 8.8 10*3/uL — ABNORMAL HIGH (ref 1.4–7.0)
Neutrophils: 68 %
Platelets: 373 10*3/uL (ref 150–450)
RBC: 4.4 x10E6/uL (ref 3.77–5.28)
RDW: 14.8 % (ref 11.7–15.4)
WBC: 12.9 10*3/uL — ABNORMAL HIGH (ref 3.4–10.8)

## 2020-01-21 LAB — COMPREHENSIVE METABOLIC PANEL
ALT: 25 IU/L (ref 0–32)
AST: 36 IU/L (ref 0–40)
Albumin/Globulin Ratio: 1.2 (ref 1.2–2.2)
Albumin: 4 g/dL (ref 3.8–4.9)
Alkaline Phosphatase: 180 IU/L — ABNORMAL HIGH (ref 44–121)
BUN/Creatinine Ratio: 14 (ref 9–23)
BUN: 14 mg/dL (ref 6–24)
Bilirubin Total: 0.2 mg/dL (ref 0.0–1.2)
CO2: 25 mmol/L (ref 20–29)
Calcium: 9.5 mg/dL (ref 8.7–10.2)
Chloride: 97 mmol/L (ref 96–106)
Creatinine, Ser: 0.98 mg/dL (ref 0.57–1.00)
GFR calc Af Amer: 75 mL/min/{1.73_m2} (ref >59)
GFR calc non Af Amer: 65 mL/min/{1.73_m2} (ref >59)
Globulin, Total: 3.4 g/dL (ref 1.5–4.5)
Glucose: 204 mg/dL — ABNORMAL HIGH (ref 65–99)
Potassium: 4.7 mmol/L (ref 3.5–5.2)
Sodium: 136 mmol/L (ref 134–144)
Total Protein: 7.4 g/dL (ref 6.0–8.5)

## 2020-01-21 LAB — HEMOGLOBIN A1C
Est. average glucose Bld gHb Est-mCnc: 269 mg/dL
Hgb A1c MFr Bld: 11 % — ABNORMAL HIGH (ref 4.8–5.6)

## 2020-01-22 ENCOUNTER — Ambulatory Visit (INDEPENDENT_AMBULATORY_CARE_PROVIDER_SITE_OTHER): Payer: Medicare Other | Admitting: Internal Medicine

## 2020-01-22 ENCOUNTER — Other Ambulatory Visit: Payer: Self-pay | Admitting: Internal Medicine

## 2020-01-22 ENCOUNTER — Encounter: Payer: Self-pay | Admitting: Internal Medicine

## 2020-01-22 ENCOUNTER — Other Ambulatory Visit: Payer: Self-pay

## 2020-01-22 VITALS — BP 124/82 | HR 86 | Ht 64.0 in | Wt 227.4 lb

## 2020-01-22 DIAGNOSIS — R748 Abnormal levels of other serum enzymes: Secondary | ICD-10-CM | POA: Diagnosis not present

## 2020-01-22 DIAGNOSIS — E1165 Type 2 diabetes mellitus with hyperglycemia: Secondary | ICD-10-CM | POA: Diagnosis not present

## 2020-01-22 DIAGNOSIS — Z794 Long term (current) use of insulin: Secondary | ICD-10-CM | POA: Diagnosis not present

## 2020-01-22 LAB — GLUCOSE, POCT (MANUAL RESULT ENTRY): POC Glucose: 301 mg/dl — AB (ref 70–99)

## 2020-01-22 MED ORDER — INSULIN LISPRO PROT & LISPRO (75-25 MIX) 100 UNIT/ML KWIKPEN
56.0000 [IU] | PEN_INJECTOR | Freq: Two times a day (BID) | SUBCUTANEOUS | 6 refills | Status: DC
Start: 2020-01-22 — End: 2020-02-26

## 2020-01-22 MED ORDER — TRULICITY 1.5 MG/0.5ML ~~LOC~~ SOAJ: 1.5000 mg | SUBCUTANEOUS | 6 refills | Status: DC

## 2020-01-22 NOTE — Patient Instructions (Addendum)
-   Take Insulin 56 units With Breakfast and 56 units with Supper  - Take Metformin 2 tablets with breakfast and 2 tablet with Supper - Increase Trulicity 1.5 mg ONCE a week ( Sunday )       HOW TO TREAT LOW BLOOD SUGARS (Blood sugar LESS THAN 70 MG/DL)  Please follow the RULE OF 15 for the treatment of hypoglycemia treatment (when your (blood sugars are less than 70 mg/dL)    STEP 1: Take 15 grams of carbohydrates when your blood sugar is low, which includes:   3-4 GLUCOSE TABS  OR  3-4 OZ OF JUICE OR REGULAR SODA OR  ONE TUBE OF GLUCOSE GEL     STEP 2: RECHECK blood sugar in 15 MINUTES STEP 3: If your blood sugar is still low at the 15 minute recheck --> then, go back to STEP 1 and treat AGAIN with another 15 grams of carbohydrates.

## 2020-01-22 NOTE — Progress Notes (Signed)
Name: Cheyenne Alvarez  Age/ Sex: 54 y.o., female   MRN/ DOB: [id number], [birth year]     PCP: Carlena Hurl, PA-C   Reason for Endocrinology Evaluation: Type 2 Diabetes Mellitus  Initial Endocrine Consultative Visit: 03/08/2016    PATIENT IDENTIFIER: Cheyenne Alvarez is a 54 y.o. female with a past medical history of T2DM, HTN, Bipolar d/o, Schizophrenia . The patient has followed with Endocrinology clinic since 03/08/2016 for consultative assistance with management of her diabetes.  DIABETIC HISTORY:  Cheyenne Alvarez was diagnosed with T2DM in 2010,  she has been on Metformin since diagnosis, and started insulin therapy in 2015. Her hemoglobin A1c has ranged from 6.3 % in 2011 peaking at 13.0%  in 2020   Pt was lost to follow up from 2018 and reestablished in 06/2018.    Trulicity started 0/9470    Pt with bipolar disorder and schizophrenia, she has a home health aid for 2 hrs a day . She lives alone   Previous hx with non-compliance.   SUBJECTIVE:   During the last visit (06/05/2019): A1c 11.9 % . Continued insulin and Metformin and started Trulicity   Today (96/28/3662): Cheyenne Alvarez is here for a  follow up on diabetes management.She has not been to our clinic in 7 months.   She checks her blood sugars 2 times daily, preprandial.The patient has not had hypoglycemic episodes since the last clinic visit.    Pt with polyuria and polydipsia  Denies nausea or vomiting   HOME DIABETES REGIMEN:  Novolog Mix 53 units BID  Metformin 500 mg, 2 tablets  BID  Trulicity 9.47 mg weekly ( Sunday)      METER DOWNLOAD SUMMARY: Did not bring     HISTORY:  Past Medical History:  Past Medical History:  Diagnosis Date  . Alopecia   . Anemia   . Anxiety   . Bipolar 1 disorder ([place])   . COPD (chronic obstructive pulmonary disease) ([place])   . Coronary artery disease   . Depression   . Diabetes mellitus 2010  . Gastritis   . GERD (gastroesophageal reflux disease)   .  Headache    migraines  . Hypertension 2010  . Obesity   . Pneumonia   . PONV (postoperative nausea and vomiting)   . PTSD (post-traumatic stress disorder)   . Schizophrenia ([place])   . Tobacco use    Past Surgical History:  Past Surgical History:  Procedure Laterality Date  . ABDOMINAL HYSTERECTOMY    . CARDIAC CATHETERIZATION    . CHOLECYSTECTOMY    . COLONOSCOPY  05/14/14   hemorrhoids, otherwise normal; Dr. Wilford Corner  . COLONOSCOPY WITH PROPOFOL N/A 05/14/2014   Procedure: COLONOSCOPY WITH PROPOFOL;  Surgeon: Wilford Corner, MD;  [facility] ENDOSCOPY;  Service: Endoscopy;  Laterality: N/A;  . ESOPHAGOGASTRODUODENOSCOPY (EGD) WITH PROPOFOL N/A 05/14/2014   Procedure: ESOPHAGOGASTRODUODENOSCOPY (EGD) WITH PROPOFOL;  Surgeon: Wilford Corner, MD;  Location[facility] ENDOSCOPY;  Service: Endoscopy;  Laterality: N/A;  . FRACTURE SURGERY     left leg  . LEG SURGERY    . TUBAL LIGATION      Social History:  reports that she quit smoking about 6 years ago. She has never used smokeless tobacco. She reports that she does not drink alcohol and does not use drugs. Family History:  Family History  Problem Relation Age of Onset  . Diabetes Mother   . Kidney failure Mother   . Diabetes Other   . Cancer Other   .  Hypertension Other   . Breast cancer Sister 35  . Breast cancer Maternal Grandmother      HOME MEDICATIONS: Allergies as of 01/22/2020      Reactions   Dexilant [dexlansoprazole] Shortness Of Breath, Diarrhea, Nausea And Vomiting, Other (See Comments)   Chest pain and abdominal pain (also)   Famotidine Anaphylaxis   Meperidine Hcl Anaphylaxis   Dilaudid [hydromorphone Hcl] Other (See Comments)   Change in mental state   Gabapentin Other (See Comments)   Memory loss   Hydrocodone Nausea And Vomiting   Ramipril Swelling, Other (See Comments)   Angioedema   Ziprasidone Hcl Other (See Comments)   Caused convulsions   Invokana [canagliflozin] Rash   Iohexol Itching,  Rash   Tape Rash   Use paper tape only       Medication List       Accurate as of January 22, 2020  2:24 PM. If you have any questions, ask your nurse or doctor.        STOP taking these medications   Trulicity 7.00 FV/4.9SW Sopn Generic drug: Dulaglutide Replaced by: Trulicity 1.5 HQ/7.5FF Sopn Stopped by: Dorita Sciara, MD     TAKE these medications   albuterol 108 (90 Base) MCG/ACT inhaler Commonly known as: VENTOLIN HFA INHALE 2 PUFFS INTO THE LUNGS EVERY 6 HOURS AS NEEDED FOR WHEEZING OR SHORTNESS OF BREATH   amantadine 100 MG capsule Commonly known as: SYMMETREL Take 100 mg by mouth 2 (two) times daily. FOR TREMORS   amLODipine 10 MG tablet Commonly known as: NORVASC Take 1 tablet (10 mg total) by mouth daily. TAKE 1 TABLET(10 MG) BY MOUTH DAILY   atenolol 50 MG tablet Commonly known as: TENORMIN Take 1 tablet (50 mg total) by mouth 2 (two) times daily.   Austedo 6 MG Tabs Generic drug: Deutetrabenazine Take 6 mg by mouth 2 (two) times daily.   BD Pen Needle Nano U/F 32G X 4 MM Misc Generic drug: Insulin Pen Needle USE TWICE DAILY   benztropine 2 MG tablet Commonly known as: COGENTIN Take 2 mg by mouth 2 (two) times daily. FOR TREMORS AND MOUTH MOVEMENTS   busPIRone 15 MG tablet Commonly known as: BUSPAR Take 15 mg by mouth 2 (two) times daily. Reported on 04/20/2015   clonazePAM 0.5 MG tablet Commonly known as: KLONOPIN Take by mouth.   doxepin 10 MG capsule Commonly known as: SINEQUAN Take 10 mg by mouth at bedtime.   Fanapt 6 MG Tabs Generic drug: Iloperidone Take 6 mg by mouth daily.   FLUoxetine 40 MG capsule Commonly known as: PROZAC Take 40 mg by mouth daily.   Fluticasone-Salmeterol 250-50 MCG/DOSE Aepb Commonly known as: Advair Diskus Inhale 1 puff into the lungs 2 (two) times daily.   fluvoxaMINE 25 MG tablet Commonly known as: LUVOX Take 25 mg by mouth 2 (two) times daily.   glucose blood test strip 1 each by Other  route 3 (three) times daily. Use as instructed   Insulin Lispro Prot & Lispro (75-25) 100 UNIT/ML Kwikpen Commonly known as: HumaLOG Mix 75/25 KwikPen Inject 56 Units into the skin in the morning and at bedtime. What changed: how much to take Changed by: Dorita Sciara, MD   Rolland Porter 819 MG/2.625ML Susp Generic drug: Paliperidone Palmitate Inject 819 mg into the skin daily.   losartan-hydrochlorothiazide 100-25 MG tablet Commonly known as: HYZAAR TAKE 1 TABLET BY MOUTH DAILY   loxapine 25 MG capsule Commonly known as: LOXITANE Take 25 mg by  mouth at bedtime.   metFORMIN 500 MG 24 hr tablet Commonly known as: GLUCOPHAGE-XR Take 2 tablets (1,000 mg total) by mouth 2 (two) times daily with a meal. What changed: See the new instructions. Changed by: Dorita Sciara, MD   nitroGLYCERIN 0.4 MG SL tablet Commonly known as: NITROSTAT Place 0.4 mg under the tongue every 5 (five) minutes as needed for chest pain. Reported on 07/15/2015   ondansetron 4 MG disintegrating tablet Commonly known as: Zofran ODT Take 1 tablet (4 mg total) by mouth every 8 (eight) hours as needed for nausea or vomiting.   pantoprazole 40 MG tablet Commonly known as: PROTONIX Take 1 tablet (40 mg total) by mouth 2 (two) times daily.   rosuvastatin 20 MG tablet Commonly known as: Crestor Take 1 tablet (20 mg total) by mouth daily.   tiagabine 12 MG tablet Commonly known as: GABITRIL Take 12 mg by mouth at bedtime.   Trulicity 1.5 GB/0.2XJ Sopn Generic drug: Dulaglutide Inject 1.5 mg into the skin once a week. Replaces: Trulicity 1.55 MC/8.0EM Sopn Started by: Dorita Sciara, MD   Vraylar capsule Generic drug: cariprazine Take 1.5 mg by mouth at bedtime.        OBJECTIVE:   Vital Signs: BP 124/82   Pulse 86   Ht 5' 4"  (1.626 m)   Wt 227 lb 6 oz (103.1 kg)   SpO2 98%   BMI 39.03 kg/m   Wt Readings from Last 3 Encounters:  01/22/20 227 lb 6 oz (103.1 kg)  01/20/20  228 lb (103.4 kg)  10/14/19 226 lb 9.6 oz (102.8 kg)     Exam: General: Pt appears well and is in NAD  Lungs: CTA  Heart: RRR  Extremities: No pretibial edema.   Neuro: MS is good with appropriate affect    DM foot exam: 06/05/2019 The skin of the feet is intact without sores or ulcerations.pt with plantar callous formation.  The pedal pulses are 2+ on right and 2+ on left. The sensation is intact to a screening 5.07, 10 gram monofilament bilaterally     DATA REVIEWED:  Lab Results  Component Value Date   HGBA1C 11.0 (H) 01/20/2020   HGBA1C 11.9 (A) 06/05/2019   HGBA1C 12.6 (A) 09/19/2018   Lab Results  Component Value Date   MICROALBUR 0.9 07/01/2016   LDLCALC 111 (H) 10/14/2019   CREATININE 0.98 01/20/2020   Lab Results  Component Value Date   MICRALBCREAT 72 (H) 10/14/2019     Lab Results  Component Value Date   CHOL 186 10/14/2019   HDL 56 10/14/2019   LDLCALC 111 (H) 10/14/2019   TRIG 105 10/14/2019   CHOLHDL 3.3 10/14/2019         In-Office BG 301 mg/dL  ASSESSMENT / PLAN / RECOMMENDATIONS:   1) Type 2 Diabetes Mellitus, Poorly controlled, Without complications - Most recent A1c of 11.0 %. Goal A1c < 7.0 %.    - Main barrier to diabetes care is memory issues - She is intolerant to SGLT-2 inhibitors.  - No meter today - In office BG 301 mg/dL ( fasting), last dose of insulin was last night. We discussed insulin pump as an option. She is open to this, will refer to our CDE - In the meantime will increase Trulcity and insulin   MEDICATIONS:  Increase  Novolog Mix 56 units BID with meals (breakfast and Supper)  Continue Metformin 500 mg XR , 2 Tabs with breakfast and 2 tab with supper  Increase  Trulicity 1.5 mg weekly   EDUCATION / INSTRUCTIONS:  BG monitoring instructions: Patient is instructed to check her blood sugars 2 times a day, breakfast and supper.  Call [facility] if: BG persistently < 70  . I reviewed the Rule  of 15 for the treatment of hypoglycemia in detail with the patient. Literature supplied.  2) Elevated Alkaline Phosphatase:   - She has been noted with elevated Alkaline phosphatase on her recent labs.  - Will check Alk. Phos isozymes to determine if this is of hepatic or bone origin     F/U in 3 months     Signed electronically by: Mack Guise, MD  [facility]  [facility] [address] [place], [place] [address] Phone: [phone] FAX: [phone]   CC: Carlena Hurl, PA-C [place] [address] Phone: [phone]  Fax: [phone]  Return to Endocrinology clinic as below: Future Appointments  Date Time Provider [place]  04/29/2020  9:50 AM Aadin Gaut, Melanie Crazier, MD [facility] None  10/19/2020 10:15 AM Tysinger, Camelia Eng, PA-C [facility]

## 2020-01-24 LAB — URINE CULTURE

## 2020-01-26 ENCOUNTER — Other Ambulatory Visit: Payer: Self-pay | Admitting: Medical

## 2020-01-26 MED ORDER — SULFAMETHOXAZOLE-TRIMETHOPRIM 800-160 MG PO TABS: 1.0000 | ORAL_TABLET | Freq: Two times a day (BID) | ORAL | 0 refills | Status: DC

## 2020-01-27 LAB — ALKALINE PHOSPHATASE, ISOENZYMES
Alkaline Phosphatase: 154 IU/L — ABNORMAL HIGH (ref 44–121)
BONE FRACTION: 19 % (ref 14–68)
INTESTINAL FRAC.: 2 % (ref 0–18)
LIVER FRACTION: 79 % (ref 18–85)

## 2020-01-28 ENCOUNTER — Telehealth: Payer: Self-pay | Admitting: Nutrition

## 2020-01-28 NOTE — Telephone Encounter (Signed)
Opened in error

## 2020-01-28 NOTE — Telephone Encounter (Signed)
Patient reported that she can not afford any pump or insulin delivery device if it is going to cost her money.  She says she is on a limited income and can not afford to pay anything more for her diabetes care.

## 2020-01-28 NOTE — Telephone Encounter (Signed)
Message left with telephone number to call me back to discuss insulin pump therapy

## 2020-02-13 LAB — SPECIMEN STATUS REPORT

## 2020-02-13 LAB — ALKALINE PHOSPHATASE, ISOENZYMES
Alkaline Phosphatase: 177 IU/L — ABNORMAL HIGH (ref 44–121)
BONE FRACTION: 22 % (ref 14–68)
INTESTINAL FRAC.: 4 % (ref 0–18)
LIVER FRACTION: 74 % (ref 18–85)

## 2020-02-15 ENCOUNTER — Other Ambulatory Visit: Payer: Self-pay | Admitting: Internal Medicine

## 2020-02-17 DIAGNOSIS — R0789 Other chest pain: Secondary | ICD-10-CM | POA: Diagnosis not present

## 2020-02-17 DIAGNOSIS — F419 Anxiety disorder, unspecified: Secondary | ICD-10-CM | POA: Diagnosis not present

## 2020-02-17 DIAGNOSIS — E785 Hyperlipidemia, unspecified: Secondary | ICD-10-CM | POA: Diagnosis not present

## 2020-02-17 DIAGNOSIS — E119 Type 2 diabetes mellitus without complications: Secondary | ICD-10-CM | POA: Diagnosis not present

## 2020-02-17 DIAGNOSIS — I10 Essential (primary) hypertension: Secondary | ICD-10-CM | POA: Diagnosis not present

## 2020-02-19 ENCOUNTER — Other Ambulatory Visit: Payer: Self-pay

## 2020-02-19 ENCOUNTER — Encounter: Payer: Medicare Other | Admitting: Nutrition

## 2020-02-19 ENCOUNTER — Telehealth: Payer: Self-pay | Admitting: Nutrition

## 2020-02-19 NOTE — Telephone Encounter (Signed)
Patient here with daughter saying that she heard back from [employer] and that it would pay for her V-Gos.  She was wanting to start it today.  I told her that we need a prescription for this from Dr. Kelton Pillar, and she will need to go by the drug store to pick these up.  I will give her a starter kit of 6 days, but the order must be written first.  We reviewed how to fill a pod and give the meal time insulin.  She wants to do this.  Rescheduled her appointment for 2 weeks out, when Dr. Kelton Pillar returns from vacation.

## 2020-02-26 MED ORDER — HUMALOG 100 UNIT/ML ~~LOC~~ SOCT: SUBCUTANEOUS | 3 refills | Status: DC

## 2020-02-26 MED ORDER — V-GO 30 KIT: 1.0000 | PACK | 11 refills | Status: DC

## 2020-02-26 NOTE — Telephone Encounter (Signed)
A prescription for V-GO 30 and Humalog vials were sent to [facility]    Pt will need to do 5 clicks per meal and 2 per snack    Thanks

## 2020-03-01 ENCOUNTER — Other Ambulatory Visit: Payer: Self-pay | Admitting: Medical

## 2020-03-02 NOTE — Telephone Encounter (Signed)
Patient contacted and told that the V-Gos and insulin prescription were at the pharmacy.  Reminded her of the appointment to start this on Wednesday.  She verbilized understanding of this.

## 2020-03-04 ENCOUNTER — Encounter: Payer: Medicare Other | Admitting: Nutrition

## 2020-03-04 ENCOUNTER — Telehealth: Payer: Self-pay | Admitting: Internal Medicine

## 2020-03-04 MED ORDER — INSULIN LISPRO 100 UNIT/ML ~~LOC~~ SOLN: SUBCUTANEOUS | 11 refills | Status: DC

## 2020-03-04 NOTE — Telephone Encounter (Signed)
Notified [facility].

## 2020-03-04 NOTE — Telephone Encounter (Signed)
Received a fax asking about Humalog.  It stated, "Please confirm products as the [employer] delivery systems requires vials.  We sent in the cartridge.   Please advise since I do not have a lot knowledge regarding [employer].  Also [facility] called as well.

## 2020-03-04 NOTE — Telephone Encounter (Signed)
Yes.  Can only use vials.  IF it is a V-Go 20, they require 2 vials per month, if it is a V-Go 30 , or V-go 40, they require 3 vials per month.

## 2020-03-04 NOTE — Telephone Encounter (Signed)
I sent the vials. The cartridges are not going to work for a pump , only for the inpen

## 2020-03-06 ENCOUNTER — Ambulatory Visit: Payer: Medicare Other | Attending: Internal Medicine

## 2020-03-06 DIAGNOSIS — Z23 Encounter for immunization: Secondary | ICD-10-CM

## 2020-03-06 NOTE — Progress Notes (Signed)
   Covid-19 Vaccination Clinic  Name:  Cheyenne Alvarez    MRN: [id number] DOB: 1965/05/31  03/06/2020  [name] was observed post Covid-19 immunization for 15 minutes without incident. She was provided with Vaccine Information Sheet and instruction to access the V-Safe system.   [name] was instructed to call 911 with any severe reactions post vaccine: Marland Kitchen Difficulty breathing  . Swelling of face and throat  . A fast heartbeat  . A bad rash all over body  . Dizziness and weakness   Immunizations Administered    Name Date Dose VIS Date Route   Moderna Covid-19 Booster Vaccine 03/06/2020  3:18 PM 0.25 mL 12/18/2019 Intramuscular   Manufacturer: Gala Murdoch   Lot: [id number]   NDC: 90931-121-62

## 2020-04-29 ENCOUNTER — Ambulatory Visit: Payer: Medicare Other | Admitting: Internal Medicine

## 2020-04-29 NOTE — Progress Notes (Deleted)
Name: Cheyenne Alvarez  Age/ Sex: 55 y.o., female   MRN/ DOB: [id number], August 01, 1965     PCP: Carlena Hurl, PA-C   Reason for Endocrinology Evaluation: Type 2 Diabetes Mellitus  Initial Endocrine Consultative Visit: 03/08/2016    PATIENT IDENTIFIER: [name] is a 55 y.o. female with a past medical history of T2DM, HTN, Bipolar d/o, Schizophrenia . The patient has followed with Endocrinology clinic since 03/08/2016 for consultative assistance with management of her diabetes.  DIABETIC HISTORY:  [name] was diagnosed with T2DM in 2010,  she has been on Metformin since diagnosis, and started insulin therapy in 2015. Her hemoglobin A1c has ranged from 6.3 % in 2011 peaking at 13.0%  in 2020   Pt was lost to follow up from 2018 and reestablished in 06/2018.    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 She lives alone   Previous hx with non-compliance.   SUBJECTIVE:   During the last visit (01/22/2020): A1c 11.0% . We increased insulin and trulicity and continued metformin  Today (04/29/2020): [name] is here for a  follow up on diabetes management.   She checks her blood sugars 2 times daily, preprandial.The patient has not had hypoglycemic episodes since the last clinic visit.    Pt with polyuria and polydipsia  Denies nausea or vomiting   HOME DIABETES REGIMEN:  Novolog Mix 56 units BID  Metformin 500 mg, 2 tablets  BID  Trulicity 1.5 mg weekly ( Sunday)      METER DOWNLOAD SUMMARY: Did not bring      DIABETIC COMPLICATIONS: Microvascular complications:    Denies: neuropathy, retinopathy   Last Eye Exam: Completed 2019  Macrovascular complications:    Denies: CAD, CVA, PVD normal stress test in 2016   HISTORY:  Past Medical History:  Past Medical History:  Diagnosis Date  . Alopecia   . Anemia   . Anxiety   . Bipolar 1 disorder ([place])   . COPD  (chronic obstructive pulmonary disease) ([place])   . Coronary artery disease   . Depression   . Diabetes mellitus 2010  . Gastritis   . GERD (gastroesophageal reflux disease)   . Headache    migraines  . Hypertension 2010  . Obesity   . Pneumonia   . PONV (postoperative nausea and vomiting)   . PTSD (post-traumatic stress disorder)   . Schizophrenia ([place])   . Tobacco use    Past Surgical History:  Past Surgical History:  Procedure Laterality Date  . ABDOMINAL HYSTERECTOMY    . CARDIAC CATHETERIZATION    . CHOLECYSTECTOMY    . COLONOSCOPY  05/14/14   hemorrhoids, otherwise normal; Dr. Wilford Corner  . COLONOSCOPY WITH PROPOFOL N/A 05/14/2014   Procedure: COLONOSCOPY WITH PROPOFOL;  Surgeon: Wilford Corner, MD;  Location: [facility] ENDOSCOPY;  Service: Endoscopy;  Laterality: N/A;  . ESOPHAGOGASTRODUODENOSCOPY (EGD) WITH PROPOFOL N/A 05/14/2014   Procedure: ESOPHAGOGASTRODUODENOSCOPY (EGD) WITH PROPOFOL;  Surgeon: Wilford Corner, MD;  Location: [facility] ENDOSCOPY;  Service: Endoscopy;  Laterality: N/A;  . FRACTURE SURGERY     left leg  . LEG SURGERY    . TUBAL LIGATION      Social History:  reports that she quit smoking about 6 years ago. She has never used smokeless tobacco. She reports that she does not drink alcohol and does not use drugs. Family History:  Family History  Problem Relation  Age of Onset  . Diabetes Mother   . Kidney failure Mother   . Diabetes Other   . Cancer Other   . Hypertension Other   . Breast cancer Sister 25  . Breast cancer Maternal Grandmother      HOME MEDICATIONS: Allergies as of 04/29/2020      Reactions   Dexilant [dexlansoprazole] Shortness Of Breath, Diarrhea, Nausea And Vomiting, Other (See Comments)   Chest pain and abdominal pain (also)   Famotidine Anaphylaxis   Meperidine Hcl Anaphylaxis   Dilaudid [hydromorphone Hcl] Other (See Comments)   Change in mental state   Gabapentin Other (See Comments)   Memory loss   Hydrocodone Nausea And  Vomiting   Ramipril Swelling, Other (See Comments)   Angioedema   Ziprasidone Hcl Other (See Comments)   Caused convulsions   Invokana [canagliflozin] Rash   Iohexol Itching, Rash   Tape Rash   Use paper tape only       Medication List       Accurate as of April 29, 2020  7:13 AM. If you have any questions, ask your nurse or doctor.        albuterol 108 (90 Base) MCG/ACT inhaler Commonly known as: VENTOLIN HFA INHALE 2 PUFFS INTO THE LUNGS EVERY 6 HOURS AS NEEDED FOR WHEEZING OR SHORTNESS OF BREATH   amantadine 100 MG capsule Commonly known as: SYMMETREL Take 100 mg by mouth 2 (two) times daily. FOR TREMORS   amLODipine 10 MG tablet Commonly known as: NORVASC Take 1 tablet (10 mg total) by mouth daily. TAKE 1 TABLET(10 MG) BY MOUTH DAILY   atenolol 50 MG tablet Commonly known as: TENORMIN Take 1 tablet (50 mg total) by mouth 2 (two) times daily.   Austedo 6 MG Tabs Generic drug: Deutetrabenazine Take 6 mg by mouth 2 (two) times daily.   benztropine 2 MG tablet Commonly known as: COGENTIN Take 2 mg by mouth 2 (two) times daily. FOR TREMORS AND MOUTH MOVEMENTS   busPIRone 15 MG tablet Commonly known as: BUSPAR Take 15 mg by mouth 2 (two) times daily. Reported on 04/20/2015   clonazePAM 0.5 MG tablet Commonly known as: KLONOPIN Take by mouth.   doxepin 10 MG capsule Commonly known as: SINEQUAN Take 10 mg by mouth at bedtime.   Fanapt 6 MG Tabs Generic drug: Iloperidone Take 6 mg by mouth daily.   FLUoxetine 40 MG capsule Commonly known as: PROZAC Take 40 mg by mouth daily.   Fluticasone-Salmeterol 250-50 MCG/DOSE Aepb Commonly known as: Advair Diskus Inhale 1 puff into the lungs 2 (two) times daily.   fluvoxaMINE 25 MG tablet Commonly known as: LUVOX Take 25 mg by mouth 2 (two) times daily.   glucose blood test strip 1 each by Other route 3 (three) times daily. Use as instructed   insulin lispro 100 UNIT/ML injection Commonly known as: HUMALOG Max  daily 70 units per pump   Invega Trinza 819 MG/2.625ML Susp Generic drug: Paliperidone Palmitate Inject 819 mg into the skin daily.   losartan-hydrochlorothiazide 100-25 MG tablet Commonly known as: HYZAAR TAKE 1 TABLET BY MOUTH DAILY   loxapine 25 MG capsule Commonly known as: LOXITANE Take 25 mg by mouth at bedtime.   metFORMIN 500 MG 24 hr tablet Commonly known as: GLUCOPHAGE-XR Take 2 tablets (1,000 mg total) by mouth 2 (two) times daily with a meal.   nitroGLYCERIN 0.4 MG SL tablet Commonly known as: NITROSTAT Place 0.4 mg under the tongue every 5 (five) minutes as needed  for chest pain. Reported on 07/15/2015   ondansetron 4 MG disintegrating tablet Commonly known as: Zofran ODT Take 1 tablet (4 mg total) by mouth every 8 (eight) hours as needed for nausea or vomiting.   pantoprazole 40 MG tablet Commonly known as: PROTONIX Take 1 tablet (40 mg total) by mouth 2 (two) times daily.   rosuvastatin 20 MG tablet Commonly known as: Crestor Take 1 tablet (20 mg total) by mouth daily.   sulfamethoxazole-trimethoprim 800-160 MG tablet Commonly known as: BACTRIM DS Take 1 tablet by mouth 2 (two) times daily.   tiagabine 12 MG tablet Commonly known as: GABITRIL Take 12 mg by mouth at bedtime.   Trulicity 1.5 NL/9.7QB Sopn Generic drug: Dulaglutide Inject 1.5 mg into the skin once a week.   V-Go 30 Kit 1 Device by Does not apply route as directed.   Vraylar capsule Generic drug: cariprazine Take 1.5 mg by mouth at bedtime.        OBJECTIVE:   Vital Signs: There were no vitals taken for this visit.  Wt Readings from Last 3 Encounters:  01/22/20 227 lb 6 oz (103.1 kg)  01/20/20 228 lb (103.4 kg)  10/14/19 226 lb 9.6 oz (102.8 kg)     Exam: General: Pt appears well and is in NAD  Lungs: CTA  Heart: RRR  Extremities: No pretibial edema.   Neuro: MS is good with appropriate affect    DM foot exam: 06/05/2019 The skin of the feet is intact without sores  or ulcerations.pt with plantar callous formation.  The pedal pulses are 2+ on right and 2+ on left. The sensation is intact to a screening 5.07, 10 gram monofilament bilaterally     DATA REVIEWED:  Lab Results  Component Value Date   HGBA1C 11.0 (H) 01/20/2020   HGBA1C 11.9 (A) 06/05/2019   HGBA1C 12.6 (A) 09/19/2018   Lab Results  Component Value Date   MICROALBUR 0.9 07/01/2016   LDLCALC 111 (H) 10/14/2019   CREATININE 0.98 01/20/2020   Lab Results  Component Value Date   MICRALBCREAT 72 (H) 10/14/2019     Lab Results  Component Value Date   CHOL 186 10/14/2019   HDL 56 10/14/2019   LDLCALC 111 (H) 10/14/2019   TRIG 105 10/14/2019   CHOLHDL 3.3 10/14/2019          ASSESSMENT / PLAN / RECOMMENDATIONS:   1) Type 2 Diabetes Mellitus, Poorly controlled, Without complications - Most recent A1c of 11.0 %. Goal A1c < 7.0 %.    - Main barrier to diabetes care is memory issues - She is intolerant to SGLT-2 inhibitors.  - No meter today - In office BG 301 mg/dL ( fasting), last dose of insulin was last night. We discussed insulin pump as an option. She is open to this, will refer to our CDE - In the meantime will increase Trulcity and insulin   MEDICATIONS:  Increase  Novolog Mix 56 units BID with meals (breakfast and Supper)  Continue Metformin 500 mg XR , 2 Tabs with breakfast and 2 tab with supper  Increase Trulicity 1.5 mg weekly   EDUCATION / INSTRUCTIONS:  BG monitoring instructions: Patient is instructed to check her blood sugars 2 times a day, breakfast and supper.  Call [place] [facility] if: BG persistently < 70  . I reviewed the Rule of 15 for the treatment of hypoglycemia in detail with the patient. Literature supplied.  2) Diabetic complications:   Eye: Does not have known diabetic  retinopathy.   Neuro/ Feet: Does not have known diabetic peripheral neuropathy .   Renal: Patient does not have known baseline CKD. She   is  on an  ACEI/ARB at present.    F/U in 3 months     Signed electronically by: Mack Guise, MD  [facility] Endocrinology  [facility] [address] [place], [place] [address] Phone: [phone] FAX: [phone]   CC: Carlena Hurl, PA-C [place] [address] Phone: [phone]  Fax: [phone]  Return to Endocrinology clinic as below: Future Appointments  Date Time Provider [place]  04/29/2020  9:50 AM , Melanie Crazier, MD [id number] None  10/19/2020 10:15 AM Tysinger, Camelia Eng, PA-C [facility]

## 2020-05-08 ENCOUNTER — Other Ambulatory Visit: Payer: Self-pay

## 2020-05-08 ENCOUNTER — Ambulatory Visit (INDEPENDENT_AMBULATORY_CARE_PROVIDER_SITE_OTHER): Payer: Medicare Other | Admitting: Medical

## 2020-05-08 ENCOUNTER — Encounter: Payer: Self-pay | Admitting: Medical

## 2020-05-08 VITALS — BP 124/70 | HR 83 | Ht 64.0 in | Wt 218.6 lb

## 2020-05-08 DIAGNOSIS — E1165 Type 2 diabetes mellitus with hyperglycemia: Secondary | ICD-10-CM | POA: Diagnosis not present

## 2020-05-08 DIAGNOSIS — I10 Essential (primary) hypertension: Secondary | ICD-10-CM | POA: Diagnosis not present

## 2020-05-08 DIAGNOSIS — Z794 Long term (current) use of insulin: Secondary | ICD-10-CM

## 2020-05-08 DIAGNOSIS — D72829 Elevated white blood cell count, unspecified: Secondary | ICD-10-CM

## 2020-05-08 DIAGNOSIS — R7309 Other abnormal glucose: Secondary | ICD-10-CM

## 2020-05-08 DIAGNOSIS — R609 Edema, unspecified: Secondary | ICD-10-CM | POA: Diagnosis not present

## 2020-05-08 DIAGNOSIS — F250 Schizoaffective disorder, bipolar type: Secondary | ICD-10-CM | POA: Diagnosis not present

## 2020-05-08 DIAGNOSIS — Z741 Need for assistance with personal care: Secondary | ICD-10-CM

## 2020-05-08 DIAGNOSIS — F319 Bipolar disorder, unspecified: Secondary | ICD-10-CM

## 2020-05-08 DIAGNOSIS — Z79899 Other long term (current) drug therapy: Secondary | ICD-10-CM

## 2020-05-08 LAB — POCT URINALYSIS DIP (PROADVANTAGE DEVICE)
Bilirubin, UA: NEGATIVE
Blood, UA: NEGATIVE
Glucose, UA: NEGATIVE mg/dL
Ketones, POC UA: NEGATIVE mg/dL
Leukocytes, UA: NEGATIVE
Nitrite, UA: NEGATIVE
Specific Gravity, Urine: 1.015
Urobilinogen, Ur: 0.2
pH, UA: 6 (ref 5.0–8.0)

## 2020-05-08 LAB — POCT CBG (FASTING - GLUCOSE)-MANUAL ENTRY: Glucose Fasting, POC: 195 mg/dL — AB (ref 70–99)

## 2020-05-08 NOTE — Progress Notes (Signed)
Subjective: Chief Complaint  Patient presents with  . Hyperglycemia   Here for concern about blood sugar  Current Health Care Team: Dr. Kelton Pillar, endocrinology Dr. Laury Deep, CNM with gynecology Dr. Terrence Dupont, cardiology Dr. Rosine Door, psychiatry Dr. Wilford Corner, GI Dentist No eye doctor Alzada Brazee, Camelia Eng, PA-C here for primary care   She recently got back into see Dr. Kelton Pillar, endocrinology after not having been in for follow up for numerous months.  According to endocrinology plan from last visit she is supposed to be using Metformin 2 tablets twice daily, Trulicity weekly injection, and Humalog mix 75/25 pen device, 56 units twice daily.  She notes compliance with this regimen however she notes in the last week or so she is seeing numbers fluctuate all over the place.  She notes blood sugars have been between 200-500.  She does not understand why she is seeing such big fluctuations.  She denies illness, no fever no aches or pains, no urinary changes no cough or respiratory symptoms.  She denies any weight gain.  She does feel like she has had more swelling in her arms but not necessarily in the legs.  She denies eating a bunch of salt  She denies confusion.  She is compliant with her other medicines as usual  She denies any other recent medication changes.  She last saw cardiology last month and states that things were stable and no changes at that time  Past Medical History:  Diagnosis Date  . Alopecia   . Anemia   . Anxiety   . Bipolar 1 disorder ([place])   . COPD (chronic obstructive pulmonary disease) ([place])   . Coronary artery disease   . Depression   . Diabetes mellitus 2010  . Gastritis   . GERD (gastroesophageal reflux disease)   . Headache    migraines  . Hypertension 2010  . Obesity   . Pneumonia   . PONV (postoperative nausea and vomiting)   . PTSD (post-traumatic stress disorder)   . Schizophrenia ([place])   . Tobacco use    Current Outpatient  Medications on File Prior to Visit  Medication Sig Dispense Refill  . albuterol (VENTOLIN HFA) 108 (90 Base) MCG/ACT inhaler INHALE 2 PUFFS INTO THE LUNGS EVERY 6 HOURS AS NEEDED FOR WHEEZING OR SHORTNESS OF BREATH 54 g 0  . amantadine (SYMMETREL) 100 MG capsule Take 100 mg by mouth 2 (two) times daily. FOR TREMORS  0  . amLODipine (NORVASC) 10 MG tablet Take 1 tablet (10 mg total) by mouth daily. TAKE 1 TABLET(10 MG) BY MOUTH DAILY 90 tablet 3  . atenolol (TENORMIN) 50 MG tablet Take 1 tablet (50 mg total) by mouth 2 (two) times daily. 180 tablet 3  . AUSTEDO 6 MG TABS Take 6 mg by mouth 2 (two) times daily.  0  . benztropine (COGENTIN) 2 MG tablet Take 2 mg by mouth 2 (two) times daily. FOR TREMORS AND MOUTH MOVEMENTS  0  . busPIRone (BUSPAR) 15 MG tablet Take 15 mg by mouth 2 (two) times daily. Reported on 04/20/2015    . clonazePAM (KLONOPIN) 0.5 MG tablet Take by mouth.    . doxepin (SINEQUAN) 10 MG capsule Take 10 mg by mouth at bedtime.     . Dulaglutide (TRULICITY) 1.5 FM/3.8GY SOPN Inject 1.5 mg into the skin once a week. 2 mL 6  . FANAPT 6 MG TABS Take 6 mg by mouth daily.   0  . FLUoxetine (PROZAC) 40 MG capsule Take 40 mg by  mouth daily.  0  . Fluticasone-Salmeterol (ADVAIR DISKUS) 250-50 MCG/DOSE AEPB Inhale 1 puff into the lungs 2 (two) times daily. 60 each 5  . fluvoxaMINE (LUVOX) 25 MG tablet Take 25 mg by mouth 2 (two) times daily.    Marland Kitchen glucose blood test strip 1 each by Other route 3 (three) times daily. Use as instructed 100 each 11  . Insulin Disposable Pump (V-GO 30) KIT 1 Device by Does not apply route as directed. 30 kit 11  . INVEGA TRINZA 819 MG/2.625ML SUSP Inject 819 mg into the skin daily.   0  . losartan-hydrochlorothiazide (HYZAAR) 100-25 MG tablet TAKE 1 TABLET BY MOUTH DAILY 90 tablet 0  . loxapine (LOXITANE) 25 MG capsule Take 25 mg by mouth at bedtime.   1  . metFORMIN (GLUCOPHAGE-XR) 500 MG 24 hr tablet Take 2 tablets (1,000 mg total) by mouth 2 (two) times  daily with a meal. 360 tablet 1  . nitroGLYCERIN (NITROSTAT) 0.4 MG SL tablet Place 0.4 mg under the tongue every 5 (five) minutes as needed for chest pain. Reported on 07/15/2015    . ondansetron (ZOFRAN ODT) 4 MG disintegrating tablet Take 1 tablet (4 mg total) by mouth every 8 (eight) hours as needed for nausea or vomiting. 6 tablet 0  . pantoprazole (PROTONIX) 40 MG tablet Take 1 tablet (40 mg total) by mouth 2 (two) times daily. 180 tablet 3  . rosuvastatin (CRESTOR) 20 MG tablet Take 1 tablet (20 mg total) by mouth daily. 90 tablet 3  . tiagabine (GABITRIL) 12 MG tablet Take 12 mg by mouth at bedtime.    . traZODone (DESYREL) 50 MG tablet Take 50 mg by mouth at bedtime.    Marland Kitchen VRAYLAR 6 MG CAPS Take 1 capsule by mouth daily.    . BD PEN NEEDLE NANO 2ND GEN 32G X 4 MM MISC 2 (two) times daily.    . clonazePAM (KLONOPIN) 1 MG tablet Take 1 mg by mouth at bedtime as needed.    Marland Kitchen HUMALOG MIX 75/25 KWIKPEN (75-25) 100 UNIT/ML Kwikpen INJECT 52 UNITS UNDER THE SKIN EVERY MORNING AND AT BEDTIME    . sulfamethoxazole-trimethoprim (BACTRIM DS) 800-160 MG tablet Take 1 tablet by mouth 2 (two) times daily. (Patient not taking: Reported on 05/08/2020) 10 tablet 0   No current facility-administered medications on file prior to visit.   ROS as in subjective    Objective: BP 124/70   Pulse 83   Ht 5' 4"  (1.626 m)   Wt 218 lb 9.6 oz (99.2 kg)   SpO2 100%   BMI 37.52 kg/m   General appearence: alert, no distress, WD/WN,  Neck: supple, no lymphadenopathy, no thyromegaly, no masses Heart: RRR, normal S1, S2, no murmurs Lungs: CTA bilaterally, no wheezes, rhonchi, or rales Abdomen: +bs, soft, non tender, non distended, no masses, no hepatomegaly, no splenomegaly Pulses: 2+ symmetric, upper and lower extremities, normal cap refill No obvious edema Psyhc: pleasant, answers questions appropriately, no obvious confusion or change in affect    Assessment: Encounter Diagnoses  Name Primary?  . Type  2 diabetes mellitus with hyperglycemia, with long-term current use of insulin ([place]) Yes  . Swelling   . Labile blood glucose   . Schizoaffective disorder, bipolar type ([place])   . Requires daily assistance for activities of daily living (ADL) and comfort needs   . Leukocytosis, unspecified type   . High risk medication use   . Essential hypertension   . Bipolar affective disorder, remission status unspecified (  [place])     Plan: Her blood sugar today in office fasting is 195  We discussed her concerns for recent high sugars.  I first asked her to make sure she contacts her diabetes specialist Dr. Kelton Pillar in the future as she is managing her diabetes now and this should be the main avenue she uses for her diabetes management  Not sure what is going on.  She does not have any illness symptoms.  No recent med changes or steroid use.  I advised her to bring her glucometer with her to the next appointment she has with [place] for Dr. Kelton Pillar to make sure her glucometer is reading similar to our glucometer in case there is some defect in her device  She also felt like her pharmacist had concern about her dosing being too high for the mix insulin.  I advise she address these concerns with Dr. Kelton Pillar.  I will send a message to her endocrinologist about today's visit in case they want to reach out with her and get her in next week  We will draw some labs today given her complaining about elevated sugars and edema in case something looks unusual  Of note she was given a V-Go insulin device but had problems using this as she is not currently using it.  She had some complications trying to use it and felt like it was causing itching and rash.   Cheyenne Alvarez was seen today for hyperglycemia.  Diagnoses and all orders for this visit:  Type 2 diabetes mellitus with hyperglycemia, with long-term current use of insulin (H[facility]) -     CBC with Differential/Platelet -     Comprehensive metabolic panel -     POCT  Urinalysis DIP (Proadvantage Device) -     POCT CBG (Fasting - Glucose)  Swelling -     CBC with Differential/Platelet -     Comprehensive metabolic panel -     POCT Urinalysis DIP (Proadvantage Device) -     POCT CBG (Fasting - Glucose)  Labile blood glucose -     CBC with Differential/Platelet -     Comprehensive metabolic panel -     POCT Urinalysis DIP (Proadvantage Device) -     POCT CBG (Fasting - Glucose)  Schizoaffective disorder, bipolar type (HCC)  Requires daily assistance for activities of daily living (ADL) and comfort needs  Leukocytosis, unspecified type  High risk medication use  Essential hypertension  Bipolar affective disorder, remission status unspecified ([place])   F/u pending labs

## 2020-05-08 NOTE — Addendum Note (Signed)
Addended by: Victorio Palm on: 05/08/2020 05:09 PM   Modules accepted: Orders

## 2020-05-09 LAB — COMPREHENSIVE METABOLIC PANEL
ALT: 13 IU/L (ref 0–32)
AST: 10 IU/L (ref 0–40)
Albumin/Globulin Ratio: 1.1 — ABNORMAL LOW (ref 1.2–2.2)
Albumin: 4.1 g/dL (ref 3.8–4.9)
Alkaline Phosphatase: 155 IU/L — ABNORMAL HIGH (ref 44–121)
BUN/Creatinine Ratio: 19 (ref 9–23)
BUN: 21 mg/dL (ref 6–24)
Bilirubin Total: <0.2 mg/dL (ref 0.0–1.2)
CO2: 24 mmol/L (ref 20–29)
Calcium: 9.4 mg/dL (ref 8.7–10.2)
Chloride: 95 mmol/L — ABNORMAL LOW (ref 96–106)
Creatinine, Ser: 1.08 mg/dL — ABNORMAL HIGH (ref 0.57–1.00)
Globulin, Total: 3.8 g/dL (ref 1.5–4.5)
Glucose: 196 mg/dL — ABNORMAL HIGH (ref 65–99)
Potassium: 4.3 mmol/L (ref 3.5–5.2)
Sodium: 135 mmol/L (ref 134–144)
Total Protein: 7.9 g/dL (ref 6.0–8.5)
eGFR: 61 mL/min/{1.73_m2} (ref >59)

## 2020-05-09 LAB — CBC WITH DIFFERENTIAL/PLATELET
Basophils Absolute: 0.1 10*3/uL (ref 0.0–0.2)
Basos: 0 %
EOS (ABSOLUTE): 0.5 10*3/uL — ABNORMAL HIGH (ref 0.0–0.4)
Eos: 4 %
Hematocrit: 34.7 % (ref 34.0–46.6)
Hemoglobin: 11.3 g/dL (ref 11.1–15.9)
Immature Grans (Abs): 0 10*3/uL (ref 0.0–0.1)
Immature Granulocytes: 0 %
Lymphocytes Absolute: 3.6 10*3/uL — ABNORMAL HIGH (ref 0.7–3.1)
Lymphs: 30 %
MCH: 24.9 pg — ABNORMAL LOW (ref 26.6–33.0)
MCHC: 32.6 g/dL (ref 31.5–35.7)
MCV: 77 fL — ABNORMAL LOW (ref 79–97)
Monocytes Absolute: 0.6 10*3/uL (ref 0.1–0.9)
Monocytes: 5 %
Neutrophils Absolute: 7.1 10*3/uL — ABNORMAL HIGH (ref 1.4–7.0)
Neutrophils: 61 %
Platelets: 386 10*3/uL (ref 150–450)
RBC: 4.53 x10E6/uL (ref 3.77–5.28)
RDW: 15.3 % (ref 11.7–15.4)
WBC: 11.9 10*3/uL — ABNORMAL HIGH (ref 3.4–10.8)

## 2020-05-12 ENCOUNTER — Other Ambulatory Visit: Payer: Self-pay

## 2020-05-12 DIAGNOSIS — D72829 Elevated white blood cell count, unspecified: Secondary | ICD-10-CM

## 2020-05-13 ENCOUNTER — Telehealth: Payer: Self-pay | Admitting: Physician Assistant

## 2020-05-13 NOTE — Telephone Encounter (Signed)
Received a new hem referral from Crosby Oyster, PA for leukocytosis. Cheyenne Alvarez has been cld and scheduled to see Karena Addison on 3/23 at 11am. Pt aware to arrive 20 minutes early.

## 2020-05-18 ENCOUNTER — Other Ambulatory Visit: Payer: Self-pay | Admitting: Internal Medicine

## 2020-05-18 ENCOUNTER — Other Ambulatory Visit: Payer: Self-pay | Admitting: Medical

## 2020-05-18 NOTE — Telephone Encounter (Signed)
Ok to approve 

## 2020-05-19 NOTE — Progress Notes (Signed)
[place] Telephone:([phone]) [phone]   Fax:([phone]) [place] NOTE  Patient Care Team: Tysinger, Camelia Eng, PA-C as PCP - General (Family Medicine) End, Harrell Gave, MD as PCP - Cardiology (Cardiology)  Hematological/Oncological History 1) Labs from PCP, Chana Bode PA-C -05/26/2019: WBC 11.0, Hgb 11.6, MCV 78.9, Plt 582 -08/07/2019: WBC 16.4, Hgb 10.0, MCV 79.8, Plt 412 -10/14/2019: WBC 14.2, Hgb 11.6, MCV 79, Plt 502 -01/20/2020: WBC 12.9, Hgb 11.4, MCV 79, Plt 373 -05/08/2020: WBC 11.9, Hgb 11.3, MCV 77, Plt 386  2) 05/20/2020: Establish care with Dede Query PA-C   CHIEF COMPLAINTS/PURPOSE OF CONSULTATION:  "Leukocytosis "  HISTORY OF PRESENTING ILLNESS:  Cheyenne Alvarez 55 y.o. female with medical history significant for COPD, anxiety, depression, bipolar 1 disorder, schizophrenia, GERD, hypertension, diabetes and obesity. Ms. Hallmark presents to the clinic for evaluation for leukocytosis.  She is accompanied by her daughter for this visit.  Patient lives alone but requires assistance to complete ADLs so there is a nurse's aide that comes to her home daily.  In addition, patient has 4 children that are her caretakers.  Patient has a good appetite without any noticeable weight loss.  She denies any nausea or vomiting.  Patient reports mid/epigastric pain secondary to ventral hernia.  Patient reports constipation with a bowel movement 3 to 4 days ago.  She currently uses stool softeners with some improvement.  She denies any hematochezia or melena.  Patient denies any dysuria, hematuria or other urinary changes.  Patient has chronic shortness of breath with exertion secondary to COPD.  Patient denies any fevers, chills, night sweats, chest pain or cough.  She has no other complaints.  Rest of the 10 point ROS is below. MEDICAL HISTORY:  Past Medical History:  Diagnosis Date  . Alopecia   . Anemia   . Anxiety   . Bipolar 1 disorder ([place])   . COPD  (chronic obstructive pulmonary disease) ([place])   . Coronary artery disease   . Depression   . Diabetes mellitus 2010  . Gastritis   . GERD (gastroesophageal reflux disease)   . Headache    migraines  . Hypertension 2010  . Obesity   . Pneumonia   . PONV (postoperative nausea and vomiting)   . PTSD (post-traumatic stress disorder)   . Schizophrenia ([place])   . Tobacco use     SURGICAL HISTORY: Past Surgical History:  Procedure Laterality Date  . ABDOMINAL HYSTERECTOMY     heavy bleeding  . CARDIAC CATHETERIZATION    . CHOLECYSTECTOMY    . COLONOSCOPY  05/14/14   hemorrhoids, otherwise normal; Dr. Wilford Corner  . COLONOSCOPY WITH PROPOFOL N/A 05/14/2014   Procedure: COLONOSCOPY WITH PROPOFOL;  Surgeon: Wilford Corner, MD;  Location: [facility] ENDOSCOPY;  Service: Endoscopy;  Laterality: N/A;  . ESOPHAGOGASTRODUODENOSCOPY (EGD) WITH PROPOFOL N/A 05/14/2014   Procedure: ESOPHAGOGASTRODUODENOSCOPY (EGD) WITH PROPOFOL;  Surgeon: Wilford Corner, MD;  Location: [facility] ENDOSCOPY;  Service: Endoscopy;  Laterality: N/A;  . FRACTURE SURGERY     left leg  . LEG SURGERY    . TUBAL LIGATION      SOCIAL HISTORY: Social History   Socioeconomic History  . Marital status: Divorced    Spouse name: Not on file  . Number of children: Not on file  . Years of education: Not on file  . Highest education level: Not on file  Occupational History  . Not on file  Tobacco Use  . Smoking status: Former Smoker    Quit date:  12/18/2013    Years since quitting: 6.4  . Smokeless tobacco: Never Used  Vaping Use  . Vaping Use: Never used  Substance and Sexual Activity  . Alcohol use: No    Alcohol/week: 0.0 standard drinks  . Drug use: No  . Sexual activity: Not Currently    Partners: Male    Birth control/protection: Surgical  Other Topics Concern  . Not on file  Social History Narrative  . Not on file   Social Determinants of Health   Financial Resource Strain: Not on file  Food Insecurity:  Not on file  Transportation Needs: Not on file  Physical Activity: Not on file  Stress: Not on file  Social Connections: Not on file  Intimate Partner Violence: Not on file    FAMILY HISTORY: Family History  Problem Relation Age of Onset  . Diabetes Mother   . Kidney failure Mother   . Diabetes Other   . Cancer Other   . Hypertension Other   . Breast cancer Sister 54  . Breast cancer Maternal Grandmother     ALLERGIES:  is allergic to dexilant [dexlansoprazole], famotidine, meperidine hcl, dilaudid [hydromorphone hcl], gabapentin, hydrocodone, ramipril, ziprasidone hcl, invokana [canagliflozin], iohexol, and tape.  MEDICATIONS:  Current Outpatient Medications  Medication Sig Dispense Refill  . ACCU-CHEK AVIVA PLUS test strip USE AS DIRECTED THREE TIMES DAILY 100 strip 2  . albuterol (VENTOLIN HFA) 108 (90 Base) MCG/ACT inhaler INHALE 2 PUFFS INTO THE LUNGS EVERY 6 HOURS AS NEEDED FOR WHEEZING OR SHORTNESS OF BREATH 54 g 0  . amantadine (SYMMETREL) 100 MG capsule Take 100 mg by mouth 2 (two) times daily. FOR TREMORS  0  . Amantadine HCl 100 MG tablet Take 100 mg by mouth 2 (two) times daily.    Marland Kitchen amLODipine (NORVASC) 10 MG tablet Take 1 tablet (10 mg total) by mouth daily. TAKE 1 TABLET(10 MG) BY MOUTH DAILY 90 tablet 3  . atenolol (TENORMIN) 50 MG tablet Take 1 tablet (50 mg total) by mouth 2 (two) times daily. 180 tablet 3  . AUSTEDO 6 MG TABS Take 6 mg by mouth 2 (two) times daily.  0  . BD PEN NEEDLE NANO 2ND GEN 32G X 4 MM MISC USE TWICE DAILY 100 each 2  . benztropine (COGENTIN) 2 MG tablet Take 2 mg by mouth 2 (two) times daily. FOR TREMORS AND MOUTH MOVEMENTS  0  . busPIRone (BUSPAR) 15 MG tablet Take 15 mg by mouth 2 (two) times daily. Reported on 04/20/2015    . clonazePAM (KLONOPIN) 0.5 MG tablet Take by mouth.    . clonazePAM (KLONOPIN) 1 MG tablet Take 1 mg by mouth at bedtime as needed.    . doxepin (SINEQUAN) 10 MG capsule Take 10 mg by mouth at bedtime.     .  Dulaglutide (TRULICITY) 1.5 QX/4.5WT SOPN Inject 1.5 mg into the skin once a week. 2 mL 6  . FANAPT 6 MG TABS Take 6 mg by mouth daily.   0  . FLUoxetine (PROZAC) 40 MG capsule Take 40 mg by mouth daily.  0  . Fluticasone-Salmeterol (ADVAIR DISKUS) 250-50 MCG/DOSE AEPB Inhale 1 puff into the lungs 2 (two) times daily. 60 each 5  . fluvoxaMINE (LUVOX) 25 MG tablet Take 25 mg by mouth 2 (two) times daily.    Marland Kitchen HUMALOG MIX 75/25 KWIKPEN (75-25) 100 UNIT/ML Kwikpen INJECT 52 UNITS UNDER THE SKIN EVERY MORNING AND AT BEDTIME 30 mL 6  . INGREZZA 80 MG capsule Take 80  mg by mouth daily.    . Insulin Disposable Pump (V-GO 30) KIT 1 Device by Does not apply route as directed. 30 kit 11  . INVEGA TRINZA 819 MG/2.625ML SUSP Inject 819 mg into the skin daily.   0  . losartan-hydrochlorothiazide (HYZAAR) 100-25 MG tablet TAKE 1 TABLET BY MOUTH DAILY 90 tablet 0  . loxapine (LOXITANE) 25 MG capsule Take 25 mg by mouth at bedtime.   1  . metFORMIN (GLUCOPHAGE-XR) 500 MG 24 hr tablet Take 2 tablets (1,000 mg total) by mouth 2 (two) times daily with a meal. 360 tablet 1  . nitroGLYCERIN (NITROSTAT) 0.4 MG SL tablet Place 0.4 mg under the tongue every 5 (five) minutes as needed for chest pain. Reported on 07/15/2015    . ondansetron (ZOFRAN ODT) 4 MG disintegrating tablet Take 1 tablet (4 mg total) by mouth every 8 (eight) hours as needed for nausea or vomiting. 6 tablet 0  . pantoprazole (PROTONIX) 40 MG tablet Take 1 tablet (40 mg total) by mouth 2 (two) times daily. 180 tablet 3  . rosuvastatin (CRESTOR) 20 MG tablet Take 1 tablet (20 mg total) by mouth daily. 90 tablet 3  . sulfamethoxazole-trimethoprim (BACTRIM DS) 800-160 MG tablet Take 1 tablet by mouth 2 (two) times daily. (Patient not taking: Reported on 05/08/2020) 10 tablet 0  . tiagabine (GABITRIL) 12 MG tablet Take 12 mg by mouth at bedtime.    . traZODone (DESYREL) 50 MG tablet Take 50 mg by mouth at bedtime.    Marland Kitchen VRAYLAR 6 MG CAPS Take 1 capsule by  mouth daily.     No current facility-administered medications for this visit.    REVIEW OF SYSTEMS:   Constitutional: ( - ) fevers, ( - )  chills , ( - ) night sweats Eyes: ( - ) blurriness of vision, ( - ) double vision, ( - ) watery eyes Ears, nose, mouth, throat, and face: ( - ) mucositis, ( - ) sore throat Respiratory: ( - ) cough, ( + ) dyspnea, ( - ) wheezes Cardiovascular: ( - ) palpitation, ( - ) chest discomfort, ( - ) lower extremity swelling Gastrointestinal:  ( - ) nausea, ( - ) heartburn, ( - ) change in bowel habits Skin: ( - ) abnormal skin rashes Lymphatics: ( - ) new lymphadenopathy, ( - ) easy bruising Neurological: ( - ) numbness, ( - ) tingling, ( - ) new weaknesses Behavioral/Psych: ( - ) mood change, ( - ) new changes  All other systems were reviewed with the patient and are negative.  PHYSICAL EXAMINATION: ECOG PERFORMANCE STATUS: 0 - Asymptomatic  Vitals:   05/20/20 1108  BP: 134/73  Pulse: 81  Resp: 18  Temp: (!) 97 F (36.1 C)  SpO2: 100%   Filed Weights   05/20/20 1108  Weight: 220 lb 4.8 oz (99.9 kg)    GENERAL: African American female in NAD  SKIN: skin color, texture, turgor are normal, no rashes or significant lesions EYES: conjunctiva are pink and non-injected, sclera clear OROPHARYNX: no exudate, no erythema; lips, buccal mucosa, and tongue normal  NECK: supple, non-tender LYMPH:  no palpable lymphadenopathy in the cervical, axillary or supraclavicular lymph nodes.  LUNGS: clear to auscultation and percussion with normal breathing effort HEART: regular rate & rhythm and no murmurs and no lower extremity edema ABDOMEN: soft, non-tender, non-distended, normal bowel sounds Musculoskeletal: no cyanosis of digits and no clubbing  PSYCH: alert & oriented x 3, fluent speech NEURO: no focal  motor/sensory deficits  LABORATORY DATA:  I have reviewed the data as listed CBC Latest Ref Rng & Units 05/20/2020 05/08/2020 01/20/2020  WBC 4.0 - 10.5  K/uL 12.1(H) 11.9(H) 12.9(H)  Hemoglobin 12.0 - 15.0 g/dL 10.3(L) 11.3 11.4  Hematocrit 36.0 - 46.0 % 31.5(L) 34.7 34.7  Platelets 150 - 400 K/uL 360 386 373    CMP Latest Ref Rng & Units 05/08/2020 01/22/2020 01/20/2020  Glucose 65 - 99 mg/dL 196(H) - -  BUN 6 - 24 mg/dL 21 - -  Creatinine 0.57 - 1.00 mg/dL 1.08(H) - -  Sodium 134 - 144 mmol/L 135 - -  Potassium 3.5 - 5.2 mmol/L 4.3 - -  Chloride 96 - 106 mmol/L 95(L) - -  CO2 20 - 29 mmol/L 24 - -  Calcium 8.7 - 10.2 mg/dL 9.4 - -  Total Protein 6.0 - 8.5 g/dL 7.9 - -  Total Bilirubin 0.0 - 1.2 mg/dL <0.2 - -  Alkaline Phos 44 - 121 IU/L 155(H) 154(H) 177(H)  AST 0 - 40 IU/L 10 - -  ALT 0 - 32 IU/L 13 - -   ASSESSMENT & PLAN Cheyenne Alvarez is a 55 y.o. female who presents to the clinic for evaluation for leukocytosis.   Upon review of recent labs, WBC has improved over the last several months. I reviewed the possible etiologies including infectious/inflammatory processes, medication induced or bone marrow disorder. Patient denies any signs/symptoms of an active infection. She is on a number of medications to manage her other medical conditions which could contribute to leukocytosis. We will proceed with further workup with labs to check CBC with differential, CMP, ESR, CRP, and save smear.   #Leukocytosis: --Obtain labs to check CBC, CMP, ESR, CRP and save smear.  --RTC in 3 months with labs unless further workup is needed.   #Microcytosis: --Will further evaluate by check iron and TIBC, ferritin and retic panel.   Orders Placed This Encounter  Procedures  . CBC with Differential (Cancer Center Only)    Standing Status:   Future    Number of Occurrences:   1    Standing Expiration Date:   05/20/2021  . Save Smear (SSMR)    Standing Status:   Future    Number of Occurrences:   1    Standing Expiration Date:   05/20/2021  . Sedimentation rate    Standing Status:   Future    Number of Occurrences:   1    Standing  Expiration Date:   05/20/2021  . C-reactive protein    Standing Status:   Future    Number of Occurrences:   1    Standing Expiration Date:   05/20/2021  . Iron and TIBC    Standing Status:   Future    Standing Expiration Date:   05/20/2021  . Ferritin    Standing Status:   Future    Standing Expiration Date:   05/20/2021  . Retic Panel    Standing Status:   Future    Standing Expiration Date:   05/20/2021    All questions were answered. The patient knows to call the clinic with any problems, questions or concerns.  A total of 60 minutes were spent on this encounter and over half of that time was spent on counseling and coordination of care as outlined above.    Dede Query, PA-C Department of Hematology/Oncology [place] at [facility] Phone: [phone]

## 2020-05-20 ENCOUNTER — Inpatient Hospital Stay: Payer: Medicare Other | Attending: Physician Assistant | Admitting: Physician Assistant

## 2020-05-20 ENCOUNTER — Other Ambulatory Visit: Payer: Self-pay

## 2020-05-20 ENCOUNTER — Encounter: Payer: Self-pay | Admitting: Physician Assistant

## 2020-05-20 ENCOUNTER — Inpatient Hospital Stay: Payer: Medicare Other

## 2020-05-20 VITALS — BP 134/73 | HR 81 | Temp 97.0°F | Resp 18 | Ht 64.0 in | Wt 220.3 lb

## 2020-05-20 DIAGNOSIS — D72829 Elevated white blood cell count, unspecified: Secondary | ICD-10-CM

## 2020-05-20 DIAGNOSIS — D509 Iron deficiency anemia, unspecified: Secondary | ICD-10-CM | POA: Diagnosis not present

## 2020-05-20 DIAGNOSIS — Z87891 Personal history of nicotine dependence: Secondary | ICD-10-CM

## 2020-05-20 DIAGNOSIS — R718 Other abnormality of red blood cells: Secondary | ICD-10-CM

## 2020-05-20 LAB — CBC WITH DIFFERENTIAL (CANCER CENTER ONLY)
Abs Immature Granulocytes: 0.06 10*3/uL (ref 0.00–0.07)
Basophils Absolute: 0 10*3/uL (ref 0.0–0.1)
Basophils Relative: 0 %
Eosinophils Absolute: 0.4 10*3/uL (ref 0.0–0.5)
Eosinophils Relative: 3 %
HCT: 31.5 % — ABNORMAL LOW (ref 36.0–46.0)
Hemoglobin: 10.3 g/dL — ABNORMAL LOW (ref 12.0–15.0)
Immature Granulocytes: 1 %
Lymphocytes Relative: 26 %
Lymphs Abs: 3.1 10*3/uL (ref 0.7–4.0)
MCH: 25.4 pg — ABNORMAL LOW (ref 26.0–34.0)
MCHC: 32.7 g/dL (ref 30.0–36.0)
MCV: 77.8 fL — ABNORMAL LOW (ref 80.0–100.0)
Monocytes Absolute: 0.5 10*3/uL (ref 0.1–1.0)
Monocytes Relative: 5 %
Neutro Abs: 8 10*3/uL — ABNORMAL HIGH (ref 1.7–7.7)
Neutrophils Relative %: 65 %
Platelet Count: 360 10*3/uL (ref 150–400)
RBC: 4.05 MIL/uL (ref 3.87–5.11)
RDW: 16.1 % — ABNORMAL HIGH (ref 11.5–15.5)
WBC Count: 12.1 10*3/uL — ABNORMAL HIGH (ref 4.0–10.5)
nRBC: 0 % (ref 0.0–0.2)

## 2020-05-20 LAB — RETIC PANEL
Immature Retic Fract: 17.4 % — ABNORMAL HIGH (ref 2.3–15.9)
RBC.: 4.07 MIL/uL (ref 3.87–5.11)
Retic Count, Absolute: 72.4 10*3/uL (ref 19.0–186.0)
Retic Ct Pct: 1.8 % (ref 0.4–3.1)
Reticulocyte Hemoglobin: 27.3 pg — ABNORMAL LOW (ref >27.9)

## 2020-05-20 LAB — C-REACTIVE PROTEIN: CRP: 9.3 mg/dL — ABNORMAL HIGH (ref <1.0)

## 2020-05-20 LAB — IRON AND TIBC
Iron: 39 ug/dL — ABNORMAL LOW (ref 41–142)
Saturation Ratios: 13 % — ABNORMAL LOW (ref 21–57)
TIBC: 288 ug/dL (ref 236–444)
UIBC: 249 ug/dL (ref 120–384)

## 2020-05-20 LAB — SAVE SMEAR(SSMR), FOR PROVIDER SLIDE REVIEW

## 2020-05-20 LAB — FERRITIN: Ferritin: 165 ng/mL (ref 11–307)

## 2020-05-20 LAB — SEDIMENTATION RATE: Sed Rate: 75 mm/hr — ABNORMAL HIGH (ref 0–22)

## 2020-05-21 ENCOUNTER — Telehealth: Payer: Self-pay | Admitting: Physician Assistant

## 2020-05-21 ENCOUNTER — Other Ambulatory Visit: Payer: Self-pay | Admitting: Medical

## 2020-05-21 DIAGNOSIS — R718 Other abnormality of red blood cells: Secondary | ICD-10-CM | POA: Insufficient documentation

## 2020-05-21 NOTE — Telephone Encounter (Signed)
Scheduled follow-up appointment per 3/23 los. Patient is aware. ?

## 2020-05-21 NOTE — Telephone Encounter (Signed)
Checked out appointment. No LOS notes needing to be scheduled. No changes made. 

## 2020-05-22 ENCOUNTER — Other Ambulatory Visit: Payer: Self-pay | Admitting: Physician Assistant

## 2020-05-22 MED ORDER — FERROUS SULFATE 325 (65 FE) MG PO TBEC: 325.0000 mg | DELAYED_RELEASE_TABLET | Freq: Every day | ORAL | 3 refills | Status: DC

## 2020-05-22 NOTE — Progress Notes (Signed)
I called the patient's daughter, Cheyenne Alvarez, to review the preliminary blood results.  Results revealed mild leukocytosis along with microcytic anemia.  The microcytic anemia is most consistent with iron deficiency.  Patient is currently not taking any oral iron replacement at this time.  I recommend starting oral iron once daily.  I will send a prescription of ferrous sulfate 325 mg to the patient's local pharmacy.  I recommended to take the oral iron with a glass of orange juice and avoid taking the medication within 2 hours from taking Protonix.  Patient's daughter denies any signs of bleeding or easy bruising. She says that there is family history of anemia. I advised to follow up with GI to evaluate for GI bleed. Last colonoscopy and EGD was in 2016. Results from colonoscopy revealed small internal hemorrhoids otherwise normal colonoscopy. Results from EGD revealed minimal candida esophagitis otherwise normal EGD.   After reviewing the remaining labs including the peripheral blood smear, I will follow-up if there needs to be any additional work-up.  The plan will be to see the patient back in 3 months with repeat labs.

## 2020-05-25 ENCOUNTER — Telehealth: Payer: Self-pay | Admitting: Physician Assistant

## 2020-05-25 DIAGNOSIS — R700 Elevated erythrocyte sedimentation rate: Secondary | ICD-10-CM

## 2020-05-25 NOTE — Telephone Encounter (Signed)
I followed up with patient's daughter, Marai Teehan, to discuss the remaining labs including elevated sedimentation rate and C-reactive protein levels that suggests an inflammatory process. The recommendation is a consultation with rheumatology for further workup, which I will place today.   Patient's daughter expressed understanding and satisfaction with the plan provided.

## 2020-05-29 ENCOUNTER — Ambulatory Visit: Payer: Medicare Other | Admitting: Internal Medicine

## 2020-07-01 ENCOUNTER — Telehealth: Payer: Self-pay | Admitting: Medical

## 2020-07-01 NOTE — Telephone Encounter (Signed)
Done

## 2020-07-01 NOTE — Telephone Encounter (Signed)
I received a request for supplies for incontinence , please complete and fax back

## 2020-08-19 ENCOUNTER — Other Ambulatory Visit: Payer: Self-pay | Admitting: Medical

## 2020-08-20 ENCOUNTER — Other Ambulatory Visit: Payer: Self-pay | Admitting: Physician Assistant

## 2020-08-20 DIAGNOSIS — D509 Iron deficiency anemia, unspecified: Secondary | ICD-10-CM

## 2020-08-21 ENCOUNTER — Other Ambulatory Visit: Payer: Medicare Other

## 2020-08-21 ENCOUNTER — Ambulatory Visit: Payer: Medicare Other | Admitting: Physician Assistant

## 2020-08-28 ENCOUNTER — Other Ambulatory Visit: Payer: Self-pay | Admitting: Internal Medicine

## 2020-09-06 ENCOUNTER — Other Ambulatory Visit: Payer: Self-pay | Admitting: Medical

## 2020-09-09 ENCOUNTER — Telehealth: Payer: Self-pay

## 2020-09-09 MED ORDER — AMLODIPINE BESYLATE 10 MG PO TABS
10.0000 mg | ORAL_TABLET | Freq: Every day | ORAL | 0 refills | Status: DC
Start: 2020-09-09 — End: 2020-10-22

## 2020-09-09 MED ORDER — ROSUVASTATIN CALCIUM 20 MG PO TABS: 20.0000 mg | ORAL_TABLET | Freq: Every day | ORAL | 0 refills | Status: DC

## 2020-09-09 NOTE — Telephone Encounter (Signed)
Received fax from [facility] for a refill on the pts. Amlodipine, Rosuvastatin, and aviva plus test strips. Pt. Last apt was 05/08/20 and next apt is 10/19/20.

## 2020-09-09 NOTE — Telephone Encounter (Signed)
Sent meds in but asked adam to send back faxed form letting them know test strips should come from endo

## 2020-09-16 ENCOUNTER — Ambulatory Visit: Payer: Medicare Other | Admitting: Internal Medicine

## 2020-09-18 ENCOUNTER — Other Ambulatory Visit: Payer: Self-pay | Admitting: Internal Medicine

## 2020-09-18 ENCOUNTER — Other Ambulatory Visit: Payer: Self-pay | Admitting: Medical

## 2020-09-21 MED ORDER — TRULICITY 1.5 MG/0.5ML ~~LOC~~ SOAJ: 1.5000 mg | SUBCUTANEOUS | 6 refills | Status: DC

## 2020-10-13 ENCOUNTER — Telehealth: Payer: Self-pay | Admitting: Medical

## 2020-10-13 NOTE — Telephone Encounter (Signed)
Cheyenne Alvarez with [employer] called Pt was seeing psychiatrist today and they wanted to make you aware  Pt has been under a lot of stress recently, daughter in ICU and had to have emergency surgery. Pt has not been eating and taking her meds the last few days.  They have explained to her the need for her to eat regularly and take her meds and she has expressed understanding  Her blood sugar today was in 300's  They wanted to make you aware and see if anything needs to be changed or addressed with the patient   Please contact pt

## 2020-10-19 ENCOUNTER — Encounter: Payer: Medicare Other | Admitting: Medical

## 2020-10-19 ENCOUNTER — Ambulatory Visit: Payer: Medicare Other | Admitting: Medical

## 2020-10-20 ENCOUNTER — Other Ambulatory Visit: Payer: Self-pay | Admitting: Medical

## 2020-10-21 ENCOUNTER — Other Ambulatory Visit: Payer: Self-pay | Admitting: Medical

## 2020-10-21 ENCOUNTER — Encounter: Payer: Medicare Other | Admitting: Medical

## 2020-10-21 ENCOUNTER — Telehealth: Payer: Self-pay | Admitting: Medical

## 2020-10-21 NOTE — Telephone Encounter (Signed)
Please see refill request from yesterday.  Please refill the Flonase.  Yes the diabetes meds need to be coming from endocrinology.  Please send a message to her endocrinology office about the refill request for diabetes medicines.  Or have her pharmacy redirect them to Dr. Lonzo Cloud

## 2020-10-22 ENCOUNTER — Other Ambulatory Visit: Payer: Self-pay | Admitting: Internal Medicine

## 2020-10-22 MED ORDER — AMLODIPINE BESYLATE 10 MG PO TABS: 10.0000 mg | ORAL_TABLET | Freq: Every day | ORAL | 0 refills | Status: DC

## 2020-10-22 MED ORDER — FLUTICASONE-SALMETEROL 250-50 MCG/ACT IN AEPB: 1.0000 | INHALATION_SPRAY | Freq: Two times a day (BID) | RESPIRATORY_TRACT | 0 refills | Status: DC

## 2020-10-22 NOTE — Telephone Encounter (Signed)
I will refill advair and bp and left detailed message for pt to contact endocrimology for her DM meds and supplies

## 2020-10-24 ENCOUNTER — Other Ambulatory Visit: Payer: Self-pay | Admitting: Medical

## 2020-10-25 ENCOUNTER — Other Ambulatory Visit: Payer: Self-pay | Admitting: Medical

## 2020-10-26 ENCOUNTER — Other Ambulatory Visit: Payer: Self-pay

## 2020-10-26 LAB — HEMOGLOBIN A1C: Hemoglobin A1C: 9.7

## 2020-10-26 MED ORDER — ATENOLOL 50 MG PO TABS: ORAL_TABLET | ORAL | 0 refills | Status: DC

## 2020-10-26 NOTE — Telephone Encounter (Signed)
Pt has an appt tomorrow. We will discuss at visit

## 2020-10-27 ENCOUNTER — Encounter: Payer: Medicare Other | Admitting: Medical

## 2020-10-27 ENCOUNTER — Ambulatory Visit (INDEPENDENT_AMBULATORY_CARE_PROVIDER_SITE_OTHER): Payer: Medicare Other | Admitting: Medical

## 2020-10-27 ENCOUNTER — Other Ambulatory Visit: Payer: Self-pay

## 2020-10-27 VITALS — Wt 223.4 lb

## 2020-10-27 DIAGNOSIS — Z23 Encounter for immunization: Secondary | ICD-10-CM

## 2020-10-27 DIAGNOSIS — Z1211 Encounter for screening for malignant neoplasm of colon: Secondary | ICD-10-CM

## 2020-10-27 DIAGNOSIS — E1165 Type 2 diabetes mellitus with hyperglycemia: Secondary | ICD-10-CM | POA: Diagnosis not present

## 2020-10-27 DIAGNOSIS — E785 Hyperlipidemia, unspecified: Secondary | ICD-10-CM

## 2020-10-27 DIAGNOSIS — I10 Essential (primary) hypertension: Secondary | ICD-10-CM | POA: Diagnosis not present

## 2020-10-27 DIAGNOSIS — Z91199 Patient's noncompliance with other medical treatment and regimen due to unspecified reason: Secondary | ICD-10-CM

## 2020-10-27 DIAGNOSIS — Z1231 Encounter for screening mammogram for malignant neoplasm of breast: Secondary | ICD-10-CM

## 2020-10-27 DIAGNOSIS — Z9119 Patient's noncompliance with other medical treatment and regimen: Secondary | ICD-10-CM

## 2020-10-27 DIAGNOSIS — Z79899 Other long term (current) drug therapy: Secondary | ICD-10-CM

## 2020-10-27 LAB — HM HEPATITIS C SCREENING LAB: HM Hepatitis Screen: NEGATIVE

## 2020-10-27 NOTE — Progress Notes (Signed)
Subjective:  Cheyenne Alvarez is a 55 y.o. female who presents for Chief Complaint  Patient presents with   med check    Med check - wants to discuss labs about oncology     Medical team: Georga Kaufmann, [place], hematology/oncology Dr. Terrace Arabia, endocrinology Dr. Raelyn Mora, gynecology Dr. Rinaldo Cloud, cardiology [facility]  Cheyenne Alvarez, Cheyenne Balo, PA-C here for primary care  Here with her daughter Cheyenne Alvarez today.   Cheyenne Alvarez lives in [place].  Cheyenne Alvarez does attend some of her psychiatry visits with her as well.  Concerns: I asked her to come in for med check as I have not seen her recently  Her medical problems include diabetes, coronary artery disease, COPD, bipolar disorder, anxiety, PTSD, schizophrenia, hypertension, anemia, tobacco use, migraines, GERD  Home health nurse came out yesterday for labs HgbA1C was 9.7%. .   She has several concerns today.  She is fearful of new bad news.  Thus in the recent months she has been avoiding going to doctors appointments out of fear forgetting other bad news she does not want to hear.  We referred her recently to hematology due to longstanding elevated white cells.  She was worried about cancer.  They did not find any particular worrisome findings but recommended she see a rheumatologist for other evaluation of elevated sed rate and CRP.  She declined to go as she was worried she had bone cancer although she has no symptoms and no prior scans to suggest this.  Currently she does not want to see the rheumatologist.  She has not seen endocrinology since November 2021.  The last time she was there they discussed the V-Go device for insulin.  She does not want to put anything on her body that has a microchip in it.  She was worried that this represents in someway the mark of the beast.  She is not using the V-Go device.  Her sugars continue to be out of control.  She notes sugars greater than 400 this morning.  She is  currently on 58 units of her mix insulin twice daily along with metformin and Trulicity.  She sees psychiatry regularly.  No recent medication changes from psychiatry.  She denies pain.  She denies any current chest pain.  Occasionally she gets a little swelling in her legs but it goes away by morning, and has not been significant.  She has not seen her heart doctor in over a year and again due to fear of getting more bad news.  No other aggravating or relieving factors.    No other c/o.  Past Medical History:  Diagnosis Date   Alopecia    Anemia    Anxiety    Bipolar 1 disorder (HCC)    COPD (chronic obstructive pulmonary disease) (HCC)    Coronary artery disease    Depression    Diabetes mellitus 2010   Gastritis    GERD (gastroesophageal reflux disease)    Headache    migraines   Hypertension 2010   Obesity    Pneumonia    PONV (postoperative nausea and vomiting)    PTSD (post-traumatic stress disorder)    Schizophrenia (HCC)    Tobacco use    Past Surgical History:  Procedure Laterality Date   ABDOMINAL HYSTERECTOMY     heavy bleeding   CARDIAC CATHETERIZATION     CHOLECYSTECTOMY     COLONOSCOPY  05/14/14   hemorrhoids, otherwise normal; Dr. Charlott Rakes   COLONOSCOPY WITH PROPOFOL N/A  05/14/2014   Procedure: COLONOSCOPY WITH PROPOFOL;  Surgeon: Charlott Rakes, MD;  Location[facility] ENDOSCOPY;  Service: Endoscopy;  Laterality: N/A;   ESOPHAGOGASTRODUODENOSCOPY (EGD) WITH PROPOFOL N/A 05/14/2014   Procedure: ESOPHAGOGASTRODUODENOSCOPY (EGD) WITH PROPOFOL;  Surgeon: Charlott Rakes, MD;  Location: [facility] ENDOSCOPY;  Service: Endoscopy;  Laterality: N/A;   FRACTURE SURGERY     left leg   LEG SURGERY     TUBAL LIGATION       The following portions of the patient's history were reviewed and updated as appropriate: allergies, current medications, past family history, past medical history, past social history, past surgical history and problem list.  ROS Otherwise as  in subjective above  Objective: Wt 223 lb 6.4 oz (101.3 kg)   BMI 38.35 kg/m   General appearance: alert, no distress, well developed, well nourished Neck: supple, no lymphadenopathy, no thyromegaly, no masses Heart: RRR, normal S1, S2, no murmurs Lungs: CTA bilaterally, no wheezes, rhonchi, or rales Pulses: 2+ radial pulses, 2+ pedal pulses, normal cap refill Ext: no edema    Assessment: Encounter Diagnoses  Name Primary?   Uncontrolled type 2 diabetes mellitus with hyperglycemia (HCC)    Need for influenza vaccination Yes   Need for pneumococcal vaccination    Essential hypertension    Hyperlipidemia, unspecified hyperlipidemia type    High risk medication use    Encounter for screening fecal occult blood testing    Noncompliance    Encounter for screening mammogram for malignant neoplasm of breast      Plan: Noncompliance, due to fear, due underlying mental health issues.  Mental health issues impairing care  Bipolar, depression, schizophrenia, anxiety-follow up with psychiatry  High risk medication use  Uncontrolled diabetes-advised she needs to f/u at this time with Dr. Lonzo Cloud to work to get sugars under control.   She is not willing to do V-Go or any device that has a microchip such as free style libre.  Elevated sed rate and CRP-discussed her hematollgy consult few months ago.   She was advised to see rheumatology for other eval but she refuses at this tim  Leukocytosis chronic-reviewed recent hematology consult notes and labs.    Screening for breast cancer-advised updated mammogram  Screening for colon cancer-advised stool cards    Joss was seen today for med check.  Diagnoses and all orders for this visit:  Need for influenza vaccination -     Flu Vaccine QUAD 18mo+IM (Fluarix, Fluzone & Alfiuria Quad PF)  Uncontrolled type 2 diabetes mellitus with hyperglycemia (HCC) -     Comprehensive metabolic panel -     CBC with  Differential/Platelet  Need for pneumococcal vaccination -     Pneumococcal polysaccharide vaccine 23-valent greater than or equal to 2yo subcutaneous/IM  Essential hypertension -     Comprehensive metabolic panel -     CBC with Differential/Platelet  Hyperlipidemia, unspecified hyperlipidemia type -     Lipid panel  High risk medication use  Encounter for screening fecal occult blood testing -     Fecal occult blood, imunochemical  Noncompliance  Encounter for screening mammogram for malignant neoplasm of breast -     MM DIGITAL SCREENING BILATERAL; Future   Follow up: pending lab

## 2020-10-28 LAB — CBC WITH DIFFERENTIAL/PLATELET
Basophils Absolute: 0 10*3/uL (ref 0.0–0.2)
Basos: 0 %
EOS (ABSOLUTE): 0.1 10*3/uL (ref 0.0–0.4)
Eos: 1 %
Hematocrit: 35.7 % (ref 34.0–46.6)
Hemoglobin: 11.5 g/dL (ref 11.1–15.9)
Immature Grans (Abs): 0 10*3/uL (ref 0.0–0.1)
Immature Granulocytes: 0 %
Lymphocytes Absolute: 2.5 10*3/uL (ref 0.7–3.1)
Lymphs: 22 %
MCH: 26.3 pg — ABNORMAL LOW (ref 26.6–33.0)
MCHC: 32.2 g/dL (ref 31.5–35.7)
MCV: 82 fL (ref 79–97)
Monocytes Absolute: 0.6 10*3/uL (ref 0.1–0.9)
Monocytes: 5 %
Neutrophils Absolute: 8.2 10*3/uL — ABNORMAL HIGH (ref 1.4–7.0)
Neutrophils: 72 %
Platelets: 343 10*3/uL (ref 150–450)
RBC: 4.37 x10E6/uL (ref 3.77–5.28)
RDW: 15.3 % (ref 11.7–15.4)
WBC: 11.4 10*3/uL — ABNORMAL HIGH (ref 3.4–10.8)

## 2020-10-28 LAB — COMPREHENSIVE METABOLIC PANEL
ALT: 10 IU/L (ref 0–32)
AST: 12 IU/L (ref 0–40)
Albumin/Globulin Ratio: 1.2 (ref 1.2–2.2)
Albumin: 3.9 g/dL (ref 3.8–4.9)
Alkaline Phosphatase: 128 IU/L — ABNORMAL HIGH (ref 44–121)
BUN/Creatinine Ratio: 12 (ref 9–23)
BUN: 11 mg/dL (ref 6–24)
Bilirubin Total: <0.2 mg/dL (ref 0.0–1.2)
CO2: 21 mmol/L (ref 20–29)
Calcium: 9.4 mg/dL (ref 8.7–10.2)
Chloride: 97 mmol/L (ref 96–106)
Creatinine, Ser: 0.95 mg/dL (ref 0.57–1.00)
Globulin, Total: 3.2 g/dL (ref 1.5–4.5)
Glucose: 275 mg/dL — ABNORMAL HIGH (ref 65–99)
Potassium: 4.3 mmol/L (ref 3.5–5.2)
Sodium: 133 mmol/L — ABNORMAL LOW (ref 134–144)
Total Protein: 7.1 g/dL (ref 6.0–8.5)
eGFR: 70 mL/min/{1.73_m2} (ref >59)

## 2020-10-28 LAB — LIPID PANEL
Chol/HDL Ratio: 3 ratio (ref 0.0–4.4)
Cholesterol, Total: 144 mg/dL (ref 100–199)
HDL: 48 mg/dL (ref >39)
LDL Chol Calc (NIH): 78 mg/dL (ref 0–99)
Triglycerides: 94 mg/dL (ref 0–149)
VLDL Cholesterol Cal: 18 mg/dL (ref 5–40)

## 2020-11-03 ENCOUNTER — Encounter: Payer: Self-pay | Admitting: Internal Medicine

## 2020-11-04 ENCOUNTER — Encounter: Payer: Medicare Other | Admitting: Medical

## 2020-11-04 ENCOUNTER — Other Ambulatory Visit: Payer: Self-pay | Admitting: Internal Medicine

## 2020-11-04 LAB — FECAL OCCULT BLOOD, IMMUNOCHEMICAL: Fecal Occult Bld: NEGATIVE

## 2020-11-05 ENCOUNTER — Encounter: Payer: Self-pay | Admitting: Internal Medicine

## 2020-11-05 LAB — FECAL OCCULT BLOOD, IMMUNOCHEMICAL: IFOBT: NEGATIVE

## 2020-11-05 NOTE — Telephone Encounter (Signed)
Is this okay to the refill patient has no showed to last appt.

## 2020-11-08 DIAGNOSIS — E1165 Type 2 diabetes mellitus with hyperglycemia: Secondary | ICD-10-CM

## 2020-11-10 ENCOUNTER — Other Ambulatory Visit: Payer: Self-pay | Admitting: Medical

## 2020-11-19 ENCOUNTER — Other Ambulatory Visit: Payer: Self-pay | Admitting: Medical

## 2020-11-24 ENCOUNTER — Ambulatory Visit: Payer: Medicare Other | Admitting: Medical

## 2020-11-24 ENCOUNTER — Other Ambulatory Visit: Payer: Self-pay

## 2020-11-24 ENCOUNTER — Ambulatory Visit (INDEPENDENT_AMBULATORY_CARE_PROVIDER_SITE_OTHER): Payer: Medicare Other | Admitting: Medical

## 2020-11-24 VITALS — BP 130/86 | HR 86 | Wt 218.2 lb

## 2020-11-24 DIAGNOSIS — F319 Bipolar disorder, unspecified: Secondary | ICD-10-CM

## 2020-11-24 DIAGNOSIS — T464X5D Adverse effect of angiotensin-converting-enzyme inhibitors, subsequent encounter: Secondary | ICD-10-CM

## 2020-11-24 DIAGNOSIS — Z87891 Personal history of nicotine dependence: Secondary | ICD-10-CM

## 2020-11-24 DIAGNOSIS — Z789 Other specified health status: Secondary | ICD-10-CM

## 2020-11-24 DIAGNOSIS — E8941 Symptomatic postprocedural ovarian failure: Secondary | ICD-10-CM

## 2020-11-24 DIAGNOSIS — Z79899 Other long term (current) drug therapy: Secondary | ICD-10-CM | POA: Diagnosis not present

## 2020-11-24 DIAGNOSIS — F411 Generalized anxiety disorder: Secondary | ICD-10-CM

## 2020-11-24 DIAGNOSIS — D72829 Elevated white blood cell count, unspecified: Secondary | ICD-10-CM

## 2020-11-24 DIAGNOSIS — E669 Obesity, unspecified: Secondary | ICD-10-CM

## 2020-11-24 DIAGNOSIS — E559 Vitamin D deficiency, unspecified: Secondary | ICD-10-CM | POA: Diagnosis not present

## 2020-11-24 DIAGNOSIS — F4310 Post-traumatic stress disorder, unspecified: Secondary | ICD-10-CM

## 2020-11-24 DIAGNOSIS — E1165 Type 2 diabetes mellitus with hyperglycemia: Secondary | ICD-10-CM | POA: Diagnosis not present

## 2020-11-24 DIAGNOSIS — R32 Unspecified urinary incontinence: Secondary | ICD-10-CM

## 2020-11-24 DIAGNOSIS — R7309 Other abnormal glucose: Secondary | ICD-10-CM

## 2020-11-24 DIAGNOSIS — E785 Hyperlipidemia, unspecified: Secondary | ICD-10-CM

## 2020-11-24 DIAGNOSIS — Z0000 Encounter for general adult medical examination without abnormal findings: Secondary | ICD-10-CM

## 2020-11-24 DIAGNOSIS — R748 Abnormal levels of other serum enzymes: Secondary | ICD-10-CM

## 2020-11-24 DIAGNOSIS — Z7185 Encounter for immunization safety counseling: Secondary | ICD-10-CM

## 2020-11-24 DIAGNOSIS — F250 Schizoaffective disorder, bipolar type: Secondary | ICD-10-CM

## 2020-11-24 DIAGNOSIS — I10 Essential (primary) hypertension: Secondary | ICD-10-CM

## 2020-11-24 DIAGNOSIS — R419 Unspecified symptoms and signs involving cognitive functions and awareness: Secondary | ICD-10-CM

## 2020-11-24 DIAGNOSIS — J449 Chronic obstructive pulmonary disease, unspecified: Secondary | ICD-10-CM

## 2020-11-24 DIAGNOSIS — K219 Gastro-esophageal reflux disease without esophagitis: Secondary | ICD-10-CM

## 2020-11-24 DIAGNOSIS — E1169 Type 2 diabetes mellitus with other specified complication: Secondary | ICD-10-CM

## 2020-11-24 DIAGNOSIS — D509 Iron deficiency anemia, unspecified: Secondary | ICD-10-CM

## 2020-11-24 DIAGNOSIS — T783XXD Angioneurotic edema, subsequent encounter: Secondary | ICD-10-CM

## 2020-11-24 DIAGNOSIS — Z741 Need for assistance with personal care: Secondary | ICD-10-CM

## 2020-11-24 DIAGNOSIS — Z794 Long term (current) use of insulin: Secondary | ICD-10-CM

## 2020-11-24 NOTE — Patient Instructions (Signed)
This visit was a preventative care visit, also known as wellness visit or routine physical.   Topics typically include healthy lifestyle, diet, exercise, preventative care, vaccinations, sick and well care, proper use of emergency dept and after hours care, as well as other concerns.     Recommendations: Continue to return yearly for your annual wellness and preventative care visits.  This gives [place] a chance to discuss healthy lifestyle, exercise, vaccinations, review your chart record, and perform screenings where appropriate.  I recommend you see your eye doctor yearly for routine vision care.  I recommend you see your dentist yearly for routine dental care including hygiene visits twice yearly.   Vaccination recommendations were reviewed Immunization History  Administered Date(s) Administered   Influenza Whole 02/04/2010   Influenza,inj,Quad PF,6+ Mos 10/27/2020   Influenza-Unspecified 11/29/2014, 11/28/2017   Moderna SARS-COV2 Booster Vaccination 03/06/2020   Moderna Sars-Covid-2 Vaccination 05/09/2019, 06/11/2019   Pneumococcal Polysaccharide-23 10/27/2020   Td 02/29/2008    Shingles vaccine:  I recommend you have a shingles vaccine to help prevent shingles or herpes zoster outbreak.   Please call your insurer to inquire about coverage for the Shingrix vaccine given in 2 doses.   Some insurers cover this vaccine after age 66, some cover this after age 60.  If your insurer covers this, then call to schedule appointment to have this vaccine here.  Check insurance coverage for tetanus vaccine as you are due for this as well  You are up-to-date on influenza and pneumococcal vaccines   Screening for cancer: Colon cancer screening: I reviewed your colonoscopy on file that is up to date from 2016 Stool cards were negative recently in August 2022  Breast cancer screening: You should perform a self breast exam monthly.   We reviewed recommendations for regular mammograms and breast  cancer screening.  Cervical cancer screening: You have had hysterectomy, so you no longer need Pap smear test   Skin cancer screening: Check your skin regularly for new changes, growing lesions, or other lesions of concern Come in for evaluation if you have skin lesions of concern.  Lung cancer screening: If you have a greater than 20 pack year history of tobacco use, then you may qualify for lung cancer screening with a chest CT scan.   Please call your insurance company to inquire about coverage for this test.  We currently don't have screenings for other cancers besides breast, cervical, colon, and lung cancers.  If you have a strong family history of cancer or have other cancer screening concerns, please let me know.    Bone health: Get at least 150 minutes of aerobic exercise weekly Get weight bearing exercise at least once weekly Bone density test:  A bone density test is an imaging test that uses a type of X-ray to measure the amount of calcium and other minerals in your bones. The test may be used to diagnose or screen you for a condition that causes weak or thin bones (osteoporosis), predict your risk for a broken bone (fracture), or determine how well your osteoporosis treatment is working. The bone density test is recommended for females 65 and older, or females or males <65 if certain risk factors such as thyroid disease, long term use of steroids such as for asthma or rheumatological issues, vitamin D deficiency, estrogen deficiency, family history of osteoporosis, self or family history of fragility fracture in first degree relative.    Heart health: Get at least 150 minutes of aerobic exercise weekly Limit alcohol  It is important to maintain a healthy blood pressure and healthy cholesterol numbers  Heart disease screening: Screening for heart disease includes screening for blood pressure, fasting lipids, glucose/diabetes screening, BMI height to weight ratio, reviewed of  smoking status, physical activity, and diet.    Goals include blood pressure 120/80 or less, maintaining a healthy lipid/cholesterol profile, preventing diabetes or keeping diabetes numbers under good control, not smoking or using tobacco products, exercising most days per week or at least 150 minutes per week of exercise, and eating healthy variety of fruits and vegetables, healthy oils, and avoiding unhealthy food choices like fried food, fast food, high sugar and high cholesterol foods.      Medical care options: I recommend you continue to seek care here first for routine care.  We try really hard to have available appointments Monday through Friday daytime hours for sick visits, acute visits, and physicals.  Urgent care should be used for after hours and weekends for significant issues that cannot wait till the next day.  The emergency department should be used for significant potentially life-threatening emergencies.  The emergency department is expensive, can often have long wait times for less significant concerns, so try to utilize primary care, urgent care, or telemedicine when possible to avoid unnecessary trips to the emergency department.  Virtual visits and telemedicine have been introduced since the pandemic started in 2020, and can be convenient ways to receive medical care.  We offer virtual appointments as well to assist you in a variety of options to seek medical care.   Advanced Directives: I recommend you consider completing a Health Care Power of Attorney and Living Will.   These documents respect your wishes and help alleviate burdens on your loved ones if you were to become terminally ill or be in a position to need those documents enforced.    You can complete Advanced Directives yourself, have them notarized, then have copies made for our office, for you and for anybody you feel should have them in safe keeping.  Or, you can have an attorney prepare these documents.   If you  haven't updated your Last Will and Testament in a while, it may be worthwhile having an attorney prepare these documents together and save on some costs.       Significant separate issues: Diabetes Follow-up with diabetes specialist as planned soon I recommend you see endocrinology every 3 to 4 months It is important to get your blood sugars under better control  Hypertension/high blood pressure Continue amlodipine 10 mg daily Continue atenolol 50 mg daily Continue losartan HCT 100/25 mg daily  Hyperlipidemia Continue Crestor 20 mg daily Your recent blood work looked fine for cholesterol  Continue to see your psychiatrist for your mental health medications  You continue with home health aide daily and your daughter [name] continues to help you manage your medications and care  I reviewed over recent hematology notes from where you had evaluation for leukocytosis-hematology recommended you see rheumatology given elevated sed rate and CRP as they found no other specific worrisome concern for elevated white cells.  We will continue to monitor labs.  You decline rheumatology consult at this time.

## 2020-11-24 NOTE — Progress Notes (Signed)
Subjective:    Cheyenne Alvarez is a 55 y.o. female who presents for Preventative Services visit and chronic medical problems/med check visit.    Primary Care Provider Tysinger, Kermit Balo, PA-C here for primary care  Current Health Care Team: Dentist, - has dentures Eye doctor, [facility] Raelyn Mora CNM with gynecology Dr. Sharyn Lull, cardiology Dr. Omelia Blackwater, psychiatry Dr. Charlott Rakes, GI Establishin with Dr. Talmage Nap, endocrinology Georga Kaufmann, PA, hematology   Medical Services you may have received from other than [facility] providers in the past year (date may be approximate) none  Exercise Current exercise habits:  everyday    Nutrition/Diet Current diet: in general, a "healthy" diet    Depression Screen Depression screen [facility] 2/9 11/24/2020  Decreased Interest 0  Down, Depressed, Hopeless 0  PHQ - 2 Score 0  Altered sleeping 0  Tired, decreased energy 0  Change in appetite 0  Feeling bad or failure about yourself  0  Trouble concentrating 0  Moving slowly or fidgety/restless 0  Suicidal thoughts 0  PHQ-9 Score 0  Difficult doing work/chores Not difficult at all  Some recent data might be hidden    Activities of Daily Living Screen/Functional Status Survey Is the patient deaf or have difficulty hearing?: No Does the patient have difficulty seeing, even when wearing glasses/contacts?: No Does the patient have difficulty concentrating, remembering, or making decisions?: Yes Does the patient have difficulty walking or climbing stairs?: No Does the patient have difficulty dressing or bathing?: Yes Does the patient have difficulty doing errands alone such as visiting a doctor's office or shopping?: Yes  Can patient draw a clock face showing 3:15 oclock, yes  Fall Risk Screen Fall Risk  11/24/2020 10/27/2020 10/14/2019 07/03/2018 07/01/2016  Falls in the past year? 0 0 0 0 No  Number falls in past yr: 0 0 - - -  Injury with Fall? 0 0 - - -  Risk Factor  Category  - - - - -  Risk for fall due to : No Fall Risks No Fall Risks - - -  Follow up Falls evaluation completed Falls evaluation completed - Falls evaluation completed -    Gait Assessment: Normal gait observed yes  Advanced directives Does patient have a Health Care Power of Attorney? Yes Does patient have a Living Will? No   Past Medical History:  Diagnosis Date   Alopecia    Anemia    Anxiety    Bipolar 1 disorder (HCC)    COPD (chronic obstructive pulmonary disease) (HCC)    Coronary artery disease    Depression    Diabetes mellitus 2010   Gastritis    GERD (gastroesophageal reflux disease)    Headache    migraines   Hypertension 2010   Obesity    Pneumonia    PONV (postoperative nausea and vomiting)    PTSD (post-traumatic stress disorder)    Schizophrenia (HCC)    Tobacco use     Past Surgical History:  Procedure Laterality Date   ABDOMINAL HYSTERECTOMY     heavy bleeding   CARDIAC CATHETERIZATION     CHOLECYSTECTOMY     COLONOSCOPY  05/14/14   hemorrhoids, otherwise normal; Dr. Charlott Rakes   COLONOSCOPY WITH PROPOFOL N/A 05/14/2014   Procedure: COLONOSCOPY WITH PROPOFOL;  Surgeon: Charlott Rakes, MD;  Location: [facility] ENDOSCOPY;  Service: Endoscopy;  Laterality: N/A;   ESOPHAGOGASTRODUODENOSCOPY (EGD) WITH PROPOFOL N/A 05/14/2014   Procedure: ESOPHAGOGASTRODUODENOSCOPY (EGD) WITH PROPOFOL;  Surgeon: Charlott Rakes, MD;  Location:  [facility] ENDOSCOPY;  Service: Endoscopy;  Laterality: N/A;   FRACTURE SURGERY     left leg   LEG SURGERY     TUBAL LIGATION      Social History   Socioeconomic History   Marital status: Divorced    Spouse name: Not on file   Number of children: Not on file   Years of education: Not on file   Highest education level: Not on file  Occupational History   Not on file  Tobacco Use   Smoking status: Former    Types: Cigarettes    Quit date: 12/18/2013    Years since quitting: 6.9   Smokeless tobacco: Never  Vaping Use    Vaping Use: Never used  Substance and Sexual Activity   Alcohol use: No    Alcohol/week: 0.0 standard drinks   Drug use: No   Sexual activity: Not Currently    Partners: Male    Birth control/protection: Surgical  Other Topics Concern   Not on file  Social History Narrative   Not on file   Social Determinants of Health   Financial Resource Strain: Not on file  Food Insecurity: Not on file  Transportation Needs: Not on file  Physical Activity: Not on file  Stress: Not on file  Social Connections: Not on file  Intimate Partner Violence: Not on file    Family History  Problem Relation Age of Onset   Diabetes Mother    Kidney failure Mother    Diabetes Other    Cancer Other    Hypertension Other    Breast cancer Sister 33   Breast cancer Maternal Grandmother      Current Outpatient Medications:    ACCU-CHEK AVIVA PLUS test strip, USE AS DIRECTED THREE TIMES DAILY, Disp: 100 strip, Rfl: 2   albuterol (VENTOLIN HFA) 108 (90 Base) MCG/ACT inhaler, INHALE 2 PUFFS INTO THE LUNGS EVERY 6 HOURS AS NEEDED FOR WHEEZING OR SHORTNESS OF BREATH, Disp: 54 g, Rfl: 0   amantadine (SYMMETREL) 100 MG capsule, Take 100 mg by mouth 2 (two) times daily. FOR TREMORS, Disp: , Rfl: 0   Amantadine HCl 100 MG tablet, Take 100 mg by mouth 2 (two) times daily., Disp: , Rfl:    amLODipine (NORVASC) 10 MG tablet, Take 1 tablet (10 mg total) by mouth daily. TAKE 1 TABLET(10 MG) BY MOUTH DAILY, Disp: 30 tablet, Rfl: 0   atenolol (TENORMIN) 50 MG tablet, TAKE 1 TABLET(50 MG) BY MOUTH TWICE DAILY, Disp: 180 tablet, Rfl: 0   AUSTEDO 6 MG TABS, Take 6 mg by mouth 2 (two) times daily., Disp: , Rfl: 0   BD PEN NEEDLE NANO 2ND GEN 32G X 4 MM MISC, USE TWICE DAILY, Disp: 100 each, Rfl: 2   benztropine (COGENTIN) 2 MG tablet, Take 2 mg by mouth 2 (two) times daily. FOR TREMORS AND MOUTH MOVEMENTS, Disp: , Rfl: 0   busPIRone (BUSPAR) 15 MG tablet, Take 15 mg by mouth 2 (two) times daily. Reported on 04/20/2015,  Disp: , Rfl:    clonazePAM (KLONOPIN) 1 MG tablet, Take 1 mg by mouth at bedtime as needed., Disp: , Rfl:    doxepin (SINEQUAN) 10 MG capsule, Take 10 mg by mouth at bedtime. , Disp: , Rfl:    Dulaglutide (TRULICITY) 1.5 MG/0.5ML SOPN, Inject 1.5 mg into the skin once a week., Disp: 2 mL, Rfl: 6   FANAPT 6 MG TABS, Take 6 mg by mouth daily. , Disp: , Rfl: 0   ferrous sulfate  325 (65 FE) MG EC tablet, Take 1 tablet (325 mg total) by mouth daily., Disp: 30 tablet, Rfl: 3   FLUoxetine (PROZAC) 40 MG capsule, Take 40 mg by mouth daily., Disp: , Rfl: 0   fluticasone-salmeterol (ADVAIR) 250-50 MCG/ACT AEPB, INHALE 1 PUFF INTO THE LUNGS IN THE MORNING AND AT BEDTIME, Disp: 60 each, Rfl: 2   fluvoxaMINE (LUVOX) 25 MG tablet, Take 25 mg by mouth 2 (two) times daily., Disp: , Rfl:    INGREZZA 80 MG capsule, Take 80 mg by mouth daily., Disp: , Rfl:    INVEGA TRINZA 819 MG/2.625ML SUSP, Inject 819 mg into the skin daily. , Disp: , Rfl: 0   losartan-hydrochlorothiazide (HYZAAR) 100-25 MG tablet, TAKE 1 TABLET BY MOUTH  DAILY, Disp: 90 tablet, Rfl: 1   loxapine (LOXITANE) 25 MG capsule, Take 25 mg by mouth at bedtime. , Disp: , Rfl: 1   metFORMIN (GLUCOPHAGE-XR) 500 MG 24 hr tablet, Take 2 tablets (1,000 mg total) by mouth 2 (two) times daily with a meal., Disp: 360 tablet, Rfl: 1   nitroGLYCERIN (NITROSTAT) 0.4 MG SL tablet, Place 0.4 mg under the tongue every 5 (five) minutes as needed for chest pain. Reported on 07/15/2015, Disp: , Rfl:    ondansetron (ZOFRAN ODT) 4 MG disintegrating tablet, Take 1 tablet (4 mg total) by mouth every 8 (eight) hours as needed for nausea or vomiting., Disp: 6 tablet, Rfl: 0   pantoprazole (PROTONIX) 40 MG tablet, TAKE 1 TABLET(40 MG) BY MOUTH TWICE DAILY, Disp: 180 tablet, Rfl: 0   rosuvastatin (CRESTOR) 20 MG tablet, Take 1 tablet (20 mg total) by mouth daily., Disp: 90 tablet, Rfl: 0   tiagabine (GABITRIL) 12 MG tablet, Take 12 mg by mouth at bedtime., Disp: , Rfl:     traZODone (DESYREL) 50 MG tablet, Take 50 mg by mouth at bedtime., Disp: , Rfl:    HUMALOG MIX 75/25 KWIKPEN (75-25) 100 UNIT/ML Kwikpen, INJECT 52 UNITS UNDER THE SKIN EVERY MORNING AND AT BEDTIME (Patient not taking: Reported on 11/24/2020), Disp: 30 mL, Rfl: 6  Allergies  Allergen Reactions   Dexilant [Dexlansoprazole] Shortness Of Breath, Diarrhea, Nausea And Vomiting and Other (See Comments)    Chest pain and abdominal pain (also)   Famotidine Anaphylaxis   Meperidine Hcl Anaphylaxis   Dilaudid [Hydromorphone Hcl] Other (See Comments)    Change in mental state   Gabapentin Other (See Comments)    Memory loss   Hydrocodone Nausea And Vomiting   Ramipril Swelling and Other (See Comments)    Angioedema    Ziprasidone Hcl Other (See Comments)    Caused convulsions   Invokana [Canagliflozin] Rash   Iohexol Itching and Rash   Tape Rash    Use paper tape only     History reviewed: allergies, current medications, past family history, past medical history, past social history, past surgical history and problem list  Chronic issues discussed: Former smoker, quit 2 years ago, COPD-doing fine on current medications, Advair daily, albuterol as needed  Diabetes-since last visit she made some changes that we discussed last visit and blood sugars have been looking better.  She sees Dr. Talmage Nap soon for her first consult  Hypertension-compliant with medications without complaint  Hyperlipidemia-compliant with medications without complaint    Acute issues discussed: None   Objective:      Biometrics BP 130/86   Pulse 86   Wt 218 lb 3.2 oz (99 kg)   BMI 37.45 kg/m   BP Readings from Last  3 Encounters:  11/24/20 130/86  05/20/20 134/73  05/08/20 124/70   Wt Readings from Last 3 Encounters:  11/24/20 218 lb 3.2 oz (99 kg)  10/27/20 223 lb 6.4 oz (101.3 kg)  05/20/20 220 lb 4.8 oz (99.9 kg)   General: Well-developed well-nourished no acute distress Alert, oriented to  person and place Displays relatively appropriate judgment as she does rely on daughter and caregiver to manage her day-to-day affairs, medications, finances etc. HEENT: normocephalic, sclerae anicteric, TMs pearly, nares patent, no discharge or erythema, pharynx normal Oral cavity: MMM, no lesions Neck: supple, no lymphadenopathy, no thyromegaly, no masses Heart: RRR, normal S1, S2, no murmurs Lungs: CTA bilaterally, no wheezes, rhonchi, or rales Abdomen: +bs, soft, non tender, non distended, no masses, no hepatomegaly, no splenomegaly Musculoskeletal: nontender, no swelling, no obvious deformity Extremities: no edema, no cyanosis, no clubbing Pulses: 2+ symmetric, upper and lower extremities, normal cap refill Neurological: alert, oriented x 3, CN2-12 intact, strength normal upper extremities and lower extremities, sensation normal throughout, DTRs 2+ throughout, no cerebellar signs, gait normal Psychiatric: normal affect, behavior normal, pleasant  Breast, pelvic, rectal-deferred to gynecology    Assessment:   Encounter Diagnoses  Name Primary?   Type 2 diabetes mellitus with hyperglycemia, with long-term current use of insulin (HCC) Yes   Medicare annual wellness visit, subsequent    High risk medication use    Vitamin D deficiency    Iron deficiency anemia, unspecified iron deficiency anemia type    Leukocytosis, unspecified type    Essential hypertension    Former smoker    Generalized anxiety disorder    Gastroesophageal reflux disease without esophagitis    Hyperlipidemia associated with type 2 diabetes mellitus (HCC)    Incontinence in female    Labile blood glucose    Vaccine counseling    Uncontrolled type 2 diabetes mellitus with hyperglycemia (HCC)    Schizoaffective disorder, bipolar type (HCC)    Requires daily assistance for activities of daily living (ADL) and comfort needs    MENOPAUSE, SURGICAL    Angiotensin converting enzyme inhibitor-aggravated angioedema,  subsequent encounter    Bipolar affective disorder, remission status unspecified (HCC)    Chronic obstructive pulmonary disease, unspecified COPD type (HCC)    Cognitive safety issue    Decreased activities of daily living (ADL)    Elevated alkaline phosphatase level    Obesity with serious comorbidity, unspecified classification, unspecified obesity type    POST TRAUMATIC STRESS SYNDROME      Plan:   A preventative services visit was completed today.  During the course of the visit today, we discussed and counseled about appropriate screening and preventive services.  A health risk assessment was established today that included a review of current medications, allergies, social history, family history, medical and preventative health history, biometrics, and preventative screenings to identify potential safety concerns or impairments.  This visit was a preventative care visit, also known as wellness visit or routine physical.   Topics typically include healthy lifestyle, diet, exercise, preventative care, vaccinations, sick and well care, proper use of emergency dept and after hours care, as well as other concerns.     Recommendations: Continue to return yearly for your annual wellness and preventative care visits.  This gives [place] a chance to discuss healthy lifestyle, exercise, vaccinations, review your chart record, and perform screenings where appropriate.  I recommend you see your eye doctor yearly for routine vision care.  I recommend you see your dentist yearly for routine dental care  including hygiene visits twice yearly.   Vaccination recommendations were reviewed Immunization History  Administered Date(s) Administered   Influenza Whole 02/04/2010   Influenza,inj,Quad PF,6+ Mos 10/27/2020   Influenza-Unspecified 11/29/2014, 11/28/2017   Moderna SARS-COV2 Booster Vaccination 03/06/2020   Moderna Sars-Covid-2 Vaccination 05/09/2019, 06/11/2019   Pneumococcal Polysaccharide-23  10/27/2020   Td 02/29/2008    Shingles vaccine:  I recommend you have a shingles vaccine to help prevent shingles or herpes zoster outbreak.   Please call your insurer to inquire about coverage for the Shingrix vaccine given in 2 doses.   Some insurers cover this vaccine after age 77, some cover this after age 83.  If your insurer covers this, then call to schedule appointment to have this vaccine here.  Check insurance coverage for tetanus vaccine as you are due for this as well  You are up-to-date on influenza and pneumococcal vaccines   Screening for cancer: Colon cancer screening: I reviewed your colonoscopy on file that is up to date from 2016 Stool cards were negative recently in August 2022  Breast cancer screening: You should perform a self breast exam monthly.   We reviewed recommendations for regular mammograms and breast cancer screening.  Cervical cancer screening: You have had hysterectomy, so you no longer need Pap smear test   Skin cancer screening: Check your skin regularly for new changes, growing lesions, or other lesions of concern Come in for evaluation if you have skin lesions of concern.  Lung cancer screening: If you have a greater than 20 pack year history of tobacco use, then you may qualify for lung cancer screening with a chest CT scan.   Please call your insurance company to inquire about coverage for this test.  We currently don't have screenings for other cancers besides breast, cervical, colon, and lung cancers.  If you have a strong family history of cancer or have other cancer screening concerns, please let me know.    Bone health: Get at least 150 minutes of aerobic exercise weekly Get weight bearing exercise at least once weekly Bone density test:  A bone density test is an imaging test that uses a type of X-ray to measure the amount of calcium and other minerals in your bones. The test may be used to diagnose or screen you for a condition that  causes weak or thin bones (osteoporosis), predict your risk for a broken bone (fracture), or determine how well your osteoporosis treatment is working. The bone density test is recommended for females 65 and older, or females or males <65 if certain risk factors such as thyroid disease, long term use of steroids such as for asthma or rheumatological issues, vitamin D deficiency, estrogen deficiency, family history of osteoporosis, self or family history of fragility fracture in first degree relative.    Heart health: Get at least 150 minutes of aerobic exercise weekly Limit alcohol It is important to maintain a healthy blood pressure and healthy cholesterol numbers  Heart disease screening: Screening for heart disease includes screening for blood pressure, fasting lipids, glucose/diabetes screening, BMI height to weight ratio, reviewed of smoking status, physical activity, and diet.    Goals include blood pressure 120/80 or less, maintaining a healthy lipid/cholesterol profile, preventing diabetes or keeping diabetes numbers under good control, not smoking or using tobacco products, exercising most days per week or at least 150 minutes per week of exercise, and eating healthy variety of fruits and vegetables, healthy oils, and avoiding unhealthy food choices like fried food, fast food,  high sugar and high cholesterol foods.      Medical care options: I recommend you continue to seek care here first for routine care.  We try really hard to have available appointments Monday through Friday daytime hours for sick visits, acute visits, and physicals.  Urgent care should be used for after hours and weekends for significant issues that cannot wait till the next day.  The emergency department should be used for significant potentially life-threatening emergencies.  The emergency department is expensive, can often have long wait times for less significant concerns, so try to utilize primary care, urgent  care, or telemedicine when possible to avoid unnecessary trips to the emergency department.  Virtual visits and telemedicine have been introduced since the pandemic started in 2020, and can be convenient ways to receive medical care.  We offer virtual appointments as well to assist you in a variety of options to seek medical care.   Advanced Directives: I recommend you consider completing a Health Care Power of Attorney and Living Will.   These documents respect your wishes and help alleviate burdens on your loved ones if you were to become terminally ill or be in a position to need those documents enforced.    You can complete Advanced Directives yourself, have them notarized, then have copies made for our office, for you and for anybody you feel should have them in safe keeping.  Or, you can have an attorney prepare these documents.   If you haven't updated your Last Will and Testament in a while, it may be worthwhile having an attorney prepare these documents together and save on some costs.       Significant separate issues: Diabetes Follow-up with diabetes specialist as planned soon I recommend you see endocrinology every 3 to 4 months It is important to get your blood sugars under better control  Hypertension/high blood pressure Continue amlodipine 10 mg daily Continue atenolol 50 mg daily Continue losartan HCT 100/25 mg daily  Hyperlipidemia Continue Crestor 20 mg daily Your recent blood work looked fine for cholesterol  Continue to see your psychiatrist for your mental health medications  You continue with home health aide daily and your daughter [name] continues to help you manage your medications and care  I reviewed over recent hematology notes from where you had evaluation for leukocytosis-hematology recommended you see rheumatology given elevated sed rate and CRP as they found no other specific worrisome concern for elevated white cells.  We will continue to monitor labs.   You decline rheumatology consult at this time.  Cheyan was seen today for cpe.  Diagnoses and all orders for this visit:  Type 2 diabetes mellitus with hyperglycemia, with long-term current use of insulin (HCC)  Medicare annual wellness visit, subsequent  High risk medication use  Vitamin D deficiency -     VITAMIN D 25 Hydroxy (Vit-D Deficiency, Fractures)  Iron deficiency anemia, unspecified iron deficiency anemia type -     Sedimentation Rate -     Iron, TIBC and Ferritin Panel  Leukocytosis, unspecified type -     Sedimentation Rate -     Iron, TIBC and Ferritin Panel  Essential hypertension  Former smoker  Generalized anxiety disorder  Gastroesophageal reflux disease without esophagitis  Hyperlipidemia associated with type 2 diabetes mellitus (HCC)  Incontinence in female  Labile blood glucose  Vaccine counseling  Uncontrolled type 2 diabetes mellitus with hyperglycemia (HCC)  Schizoaffective disorder, bipolar type (HCC)  Requires daily assistance for activities of daily  living (ADL) and comfort needs  MENOPAUSE, SURGICAL  Angiotensin converting enzyme inhibitor-aggravated angioedema, subsequent encounter  Bipolar affective disorder, remission status unspecified (HCC)  Chronic obstructive pulmonary disease, unspecified COPD type (HCC)  Cognitive safety issue  Decreased activities of daily living (ADL)  Elevated alkaline phosphatase level  Obesity with serious comorbidity, unspecified classification, unspecified obesity type  POST TRAUMATIC STRESS SYNDROME     Medicare Attestation A preventative services visit was completed today.  During the course of the visit the patient was educated and counseled about appropriate screening and preventive services.  A health risk assessment was established with the patient that included a review of current medications, allergies, social history, family history, medical and preventative health history,  biometrics, and preventative screenings to identify potential safety concerns or impairments.  A personalized plan was printed today for the patient's records and use.   Personalized health advice and education was given today to reduce health risks and promote self management and wellness.  Information regarding end of life planning was discussed today.  Kristian Covey, PA-C   11/24/2020

## 2020-11-25 ENCOUNTER — Other Ambulatory Visit: Payer: Self-pay | Admitting: Medical

## 2020-11-25 LAB — SEDIMENTATION RATE: Sed Rate: 45 mm/hr — ABNORMAL HIGH (ref 0–40)

## 2020-11-25 LAB — IRON,TIBC AND FERRITIN PANEL
Ferritin: 106 ng/mL (ref 15–150)
Iron Saturation: 15 % (ref 15–55)
Iron: 43 ug/dL (ref 27–159)
Total Iron Binding Capacity: 284 ug/dL (ref 250–450)
UIBC: 241 ug/dL (ref 131–425)

## 2020-11-25 LAB — VITAMIN D 25 HYDROXY (VIT D DEFICIENCY, FRACTURES): Vit D, 25-Hydroxy: 39.2 ng/mL (ref 30.0–100.0)

## 2020-11-25 LAB — HEMOGLOBIN A1C: Hemoglobin A1C: 9

## 2020-11-25 MED ORDER — FERROUS SULFATE 325 (65 FE) MG PO TBEC: 325.0000 mg | DELAYED_RELEASE_TABLET | Freq: Every day | ORAL | 1 refills | Status: DC

## 2020-11-26 ENCOUNTER — Encounter: Payer: Self-pay | Admitting: Internal Medicine

## 2020-12-10 ENCOUNTER — Ambulatory Visit
Admission: RE | Admit: 2020-12-10 | Discharge: 2020-12-10 | Disposition: A | Discharge status: Home / Self Care | Payer: Medicare Other | Source: Ambulatory Visit | Attending: Medical | Admitting: Medical

## 2020-12-10 ENCOUNTER — Ambulatory Visit: Payer: Medicare Other

## 2020-12-10 ENCOUNTER — Other Ambulatory Visit: Payer: Self-pay

## 2020-12-10 DIAGNOSIS — Z1231 Encounter for screening mammogram for malignant neoplasm of breast: Secondary | ICD-10-CM

## 2020-12-17 ENCOUNTER — Other Ambulatory Visit: Payer: Self-pay | Admitting: Medical

## 2021-01-22 ENCOUNTER — Other Ambulatory Visit: Payer: Self-pay | Admitting: Medical

## 2021-01-25 ENCOUNTER — Telehealth: Payer: Self-pay

## 2021-01-25 NOTE — Telephone Encounter (Signed)
Pt. Called stating that he BP is reading 213/110 and she said she was having trouble seeing. She wanted to know if you could call her in something to help her BP go down. I told her I would send you an urgent message but she may need to go to the ER.

## 2021-01-25 NOTE — Telephone Encounter (Signed)
Pt. Going to the ED per your request.

## 2021-02-09 ENCOUNTER — Other Ambulatory Visit: Payer: Self-pay | Admitting: Medical

## 2021-02-16 ENCOUNTER — Other Ambulatory Visit: Payer: Self-pay | Admitting: Medical

## 2021-03-17 ENCOUNTER — Other Ambulatory Visit: Payer: Self-pay | Admitting: Medical

## 2021-03-18 ENCOUNTER — Other Ambulatory Visit: Payer: Self-pay

## 2021-03-18 MED ORDER — ATENOLOL 50 MG PO TABS: ORAL_TABLET | ORAL | 0 refills | Status: DC

## 2021-03-19 ENCOUNTER — Telehealth: Payer: Self-pay

## 2021-03-19 ENCOUNTER — Other Ambulatory Visit: Payer: Self-pay | Admitting: Medical

## 2021-03-19 MED ORDER — FLUTICASONE-SALMETEROL 250-50 MCG/ACT IN AEPB: 1.0000 | INHALATION_SPRAY | Freq: Two times a day (BID) | RESPIRATORY_TRACT | 2 refills | Status: DC

## 2021-03-19 NOTE — Telephone Encounter (Signed)
Received fax from [facility] for a refill on the pts. Advair 250-50 last apt was 11/24/20.

## 2021-03-26 ENCOUNTER — Other Ambulatory Visit: Payer: Self-pay | Admitting: Medical

## 2021-04-05 ENCOUNTER — Other Ambulatory Visit: Payer: Self-pay | Admitting: Medical

## 2021-04-23 ENCOUNTER — Other Ambulatory Visit: Payer: Self-pay | Admitting: Medical

## 2021-05-12 ENCOUNTER — Encounter: Payer: Self-pay | Admitting: Obstetrics

## 2021-05-12 ENCOUNTER — Ambulatory Visit (INDEPENDENT_AMBULATORY_CARE_PROVIDER_SITE_OTHER): Payer: Medicare Other | Admitting: Obstetrics

## 2021-05-12 ENCOUNTER — Other Ambulatory Visit: Payer: Self-pay

## 2021-05-12 VITALS — BP 135/83 | HR 81 | Ht 63.0 in | Wt 214.0 lb

## 2021-05-12 DIAGNOSIS — Z90710 Acquired absence of both cervix and uterus: Secondary | ICD-10-CM | POA: Diagnosis not present

## 2021-05-12 DIAGNOSIS — R102 Pelvic and perineal pain: Secondary | ICD-10-CM

## 2021-05-12 DIAGNOSIS — Z01419 Encounter for gynecological examination (general) (routine) without abnormal findings: Secondary | ICD-10-CM | POA: Diagnosis not present

## 2021-05-12 DIAGNOSIS — Z780 Asymptomatic menopausal state: Secondary | ICD-10-CM

## 2021-05-12 DIAGNOSIS — N393 Stress incontinence (female) (male): Secondary | ICD-10-CM

## 2021-05-12 DIAGNOSIS — E669 Obesity, unspecified: Secondary | ICD-10-CM

## 2021-05-12 NOTE — Progress Notes (Signed)
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 ?Reports bilateral lower abdomen, "feels like the bottom of my stomach is gonna fall out" X 1 month. ?Denies vaginal discharge or irritation. Reports " I got my blood sugars under control" and this has resolved recurrent yeast. ? ?Depression and anxiety screen positive: takes medications and sees a Veterinary surgeon. Managed by PCP. ?

## 2021-05-12 NOTE — Progress Notes (Signed)
? ?Subjective: ? ? ?  ?  ? Cheyenne Alvarez is a 56 y.o. female here for a routine exam.  Current complaints: Pelvic pain and leaking of urine with cough, sneeze, etc, and she has to wear a pad because of the urinary incontinence.  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 ? ?Personal health questionnaire:  ?Is patient Ashkenazi Jewish, have a family history of breast and/or ovarian cancer: yes ?Is there a family history of uterine cancer diagnosed at age < 38, gastrointestinal cancer, urinary tract cancer, family member who is a Field seismologist syndrome-associated carrier: no ?Is the patient overweight and hypertensive, family history of diabetes, personal history of gestational diabetes, preeclampsia or PCOS: yes ?Is patient over 61, have PCOS,  family history of premature CHD under age 62, diabetes, smoke, have hypertension or peripheral artery disease:  no ?At any time, has a partner hit, kicked or otherwise hurt or frightened you?: no ?Over the past 2 weeks, have you felt down, depressed or hopeless?: no ?Over the past 2 weeks, have you felt little interest or pleasure in doing things?:no ? ? ?Gynecologic History ?No LMP recorded. Patient has had a hysterectomy. ?Contraception: status post hysterectomy ?Last Pap: 2011 Results were: normal ?Last mammogram: 12-10-2020. Results were: normal ? ?Obstetric History ?OB History  ?Gravida Para Term Preterm AB Living  ?5       1 4   ?SAB IAB Ectopic Multiple Live Births  ?1          ?  ?# Outcome Date GA Lbr Len/2nd Weight Sex Delivery Anes PTL Lv  ?5 Gravida           ?4 Gravida           ?3 Gravida           ?2 Gravida           ?1 SAB           ? ? ?Past Medical History:  ?Diagnosis Date  ? Alopecia   ? Anemia   ? Anxiety   ? Bipolar 1 disorder ([place])   ? COPD (chronic obstructive pulmonary disease) ([place])   ? Coronary artery disease   ? Depression   ? Diabetes mellitus  2010  ? Gastritis   ? GERD (gastroesophageal reflux disease)   ? Headache   ? migraines  ? Hypertension 2010  ? Obesity   ? Pneumonia   ? PONV (postoperative nausea and vomiting)   ? PTSD (post-traumatic stress disorder)   ? Schizophrenia ([place])   ? Tobacco use   ?  ?Past Surgical History:  ?Procedure Laterality Date  ? ABDOMINAL HYSTERECTOMY    ? heavy bleeding  ? CARDIAC CATHETERIZATION    ? CHOLECYSTECTOMY    ? COLONOSCOPY  05/14/14  ? hemorrhoids, otherwise normal; Dr. Wilford Corner  ? COLONOSCOPY WITH PROPOFOL N/A 05/14/2014  ? Procedure: COLONOSCOPY WITH PROPOFOL;  Surgeon: Wilford Corner, MD;  Location: [facility] ENDOSCOPY;  Service: Endoscopy;  Laterality: N/A;  ? ESOPHAGOGASTRODUODENOSCOPY (EGD) WITH PROPOFOL N/A 05/14/2014  ? Procedure: ESOPHAGOGASTRODUODENOSCOPY (EGD) WITH PROPOFOL;  Surgeon: Wilford Corner, MD;  Location: [facility] ENDOSCOPY;  Service: Endoscopy;  Laterality: N/A;  ? FRACTURE SURGERY    ? left leg  ? LEG SURGERY    ? TUBAL LIGATION    ?  ? ?Current Outpatient Medications:  ?  ACCU-CHEK AVIVA PLUS test strip, USE AS DIRECTED THREE TIMES DAILY, Disp:  100 strip, Rfl: 2 ?  albuterol (VENTOLIN HFA) 108 (90 Base) MCG/ACT inhaler, INHALE 2 PUFFS INTO THE LUNGS EVERY 6 HOURS AS NEEDED FOR WHEEZING OR SHORTNESS OF BREATH, Disp: 54 g, Rfl: 0 ?  amantadine (SYMMETREL) 100 MG capsule, Take 100 mg by mouth 2 (two) times daily. FOR TREMORS, Disp: , Rfl: 0 ?  amLODipine (NORVASC) 10 MG tablet, Take 1 tablet (10 mg total) by mouth daily. TAKE 1 TABLET(10 MG) BY MOUTH DAILY, Disp: 30 tablet, Rfl: 0 ?  atenolol (TENORMIN) 50 MG tablet, Take 1 tablet by mouth twice daily, Disp: 60 tablet, Rfl: 2 ?  AUSTEDO 6 MG TABS, Take 6 mg by mouth 2 (two) times daily., Disp: , Rfl: 0 ?  BD PEN NEEDLE NANO 2ND GEN 32G X 4 MM MISC, USE TWICE DAILY, Disp: 100 each, Rfl: 2 ?  benztropine (COGENTIN) 2 MG tablet, Take 2 mg by mouth 2 (two) times daily. FOR TREMORS AND MOUTH MOVEMENTS, Disp: , Rfl: 0 ?  busPIRone (BUSPAR) 15 MG tablet,  Take 15 mg by mouth 2 (two) times daily. Reported on 04/20/2015, Disp: , Rfl:  ?  clonazePAM (KLONOPIN) 1 MG tablet, Take 1 mg by mouth at bedtime as needed., Disp: , Rfl:  ?  doxepin (SINEQUAN) 10 MG capsule, Take 10 mg by mouth at bedtime. , Disp: , Rfl:  ?  Dulaglutide (TRULICITY) 1.5 0000000 SOPN, Inject 1.5 mg into the skin once a week., Disp: 2 mL, Rfl: 6 ?  FANAPT 6 MG TABS, Take 6 mg by mouth daily. , Disp: , Rfl: 0 ?  ferrous sulfate 325 (65 FE) MG EC tablet, Take 1 tablet (325 mg total) by mouth daily., Disp: 90 tablet, Rfl: 1 ?  FLUoxetine (PROZAC) 40 MG capsule, Take 40 mg by mouth daily., Disp: , Rfl: 0 ?  fluticasone-salmeterol (ADVAIR) 250-50 MCG/ACT AEPB, Inhale 1 puff twice daily, morning & at bedtime, Disp: 60 each, Rfl: 2 ?  fluvoxaMINE (LUVOX) 25 MG tablet, Take 25 mg by mouth 2 (two) times daily., Disp: , Rfl:  ?  HUMALOG MIX 75/25 KWIKPEN (75-25) 100 UNIT/ML Kwikpen, INJECT 52 UNITS UNDER THE SKIN EVERY MORNING AND AT BEDTIME, Disp: 30 mL, Rfl: 6 ?  INGREZZA 80 MG capsule, Take 80 mg by mouth daily., Disp: , Rfl:  ?  INVEGA TRINZA 819 MG/2.625ML SUSP, Inject 819 mg into the skin daily. , Disp: , Rfl: 0 ?  losartan-hydrochlorothiazide (HYZAAR) 100-25 MG tablet, TAKE 1 TABLET BY MOUTH  DAILY, Disp: 90 tablet, Rfl: 1 ?  loxapine (LOXITANE) 25 MG capsule, Take 25 mg by mouth at bedtime. , Disp: , Rfl: 1 ?  metFORMIN (GLUCOPHAGE-XR) 500 MG 24 hr tablet, Take 2 tablets (1,000 mg total) by mouth 2 (two) times daily with a meal., Disp: 360 tablet, Rfl: 1 ?  nitroGLYCERIN (NITROSTAT) 0.4 MG SL tablet, Place 0.4 mg under the tongue every 5 (five) minutes as needed for chest pain. Reported on 07/15/2015, Disp: , Rfl:  ?  pantoprazole (PROTONIX) 40 MG tablet, TAKE 1 TABLET BY MOUTH  TWICE DAILY, Disp: 180 tablet, Rfl: 3 ?  rosuvastatin (CRESTOR) 20 MG tablet, Take 1 tablet by mouth every day, Disp: 30 tablet, Rfl: 2 ?  tiagabine (GABITRIL) 12 MG tablet, Take 12 mg by mouth at bedtime., Disp: , Rfl:  ?   traZODone (DESYREL) 50 MG tablet, Take 50 mg by mouth at bedtime., Disp: , Rfl:  ?  Amantadine HCl 100 MG tablet, Take 100 mg by mouth 2 (  two) times daily., Disp: , Rfl:  ?  ondansetron (ZOFRAN ODT) 4 MG disintegrating tablet, Take 1 tablet (4 mg total) by mouth every 8 (eight) hours as needed for nausea or vomiting. (Patient not taking: Reported on 05/12/2021), Disp: 6 tablet, Rfl: 0 ?Allergies  ?Allergen Reactions  ? Dexilant [Dexlansoprazole] Shortness Of Breath, Diarrhea, Nausea And Vomiting and Other (See Comments)  ?  Chest pain and abdominal pain (also)  ? Famotidine Anaphylaxis  ? Meperidine Hcl Anaphylaxis  ? Dilaudid [Hydromorphone Hcl] Other (See Comments)  ?  Change in mental state  ? Gabapentin Other (See Comments)  ?  Memory loss  ? Hydrocodone Nausea And Vomiting  ? Ramipril Swelling and Other (See Comments)  ?  Angioedema ?  ? Ziprasidone Hcl Other (See Comments)  ?  Caused convulsions  ? Invokana [Canagliflozin] Rash  ? Iohexol Itching and Rash  ? Tape Rash  ?  Use paper tape only   ?  ?Social History  ? ?Tobacco Use  ? Smoking status: Former  ?  Types: Cigarettes  ?  Quit date: 12/18/2013  ?  Years since quitting: 7.4  ? Smokeless tobacco: Never  ?Substance Use Topics  ? Alcohol use: No  ?  Alcohol/week: 0.0 standard drinks  ?  ?Family History  ?Problem Relation Age of Onset  ? Breast cancer Maternal Grandmother   ? Seizures Father   ? Diabetes Mother   ? Kidney failure Mother   ? Cancer Brother   ? Mental illness Brother   ? Breast cancer Sister 27  ? Diabetes Sister   ? Drug abuse Sister   ? Hypertension Sister   ? Diabetes Other   ? Cancer Other   ? Hypertension Other   ?  ? ? ?Review of Systems ? ?Constitutional: negative for fatigue and weight loss ?Respiratory: negative for cough and wheezing ?Cardiovascular: negative for chest pain, fatigue and palpitations ?Gastrointestinal: negative for abdominal pain and change in bowel habits ?Musculoskeletal:negative for myalgias ?Neurological: negative  for gait problems and tremors ?Behavioral/Psych: negative for abusive relationship, depression ?Endocrine: negative for temperature intolerance    ?Genitourinary:negative for abnormal menstrual periods, gen

## 2021-05-13 ENCOUNTER — Ambulatory Visit
Admission: RE | Admit: 2021-05-13 | Discharge: 2021-05-13 | Disposition: A | Discharge status: Home / Self Care | Payer: Medicare Other | Source: Ambulatory Visit | Attending: Obstetrics | Admitting: Obstetrics

## 2021-05-20 ENCOUNTER — Telehealth: Payer: Self-pay | Admitting: Family Medicine

## 2021-05-20 NOTE — Telephone Encounter (Signed)
Patient left message on voice mail she needs her fluid pill refilled as her feet are swollen. [phone]  ?

## 2021-05-24 ENCOUNTER — Other Ambulatory Visit: Payer: Self-pay | Admitting: Medical

## 2021-05-24 MED ORDER — AMLODIPINE BESYLATE 10 MG PO TABS: 10.0000 mg | ORAL_TABLET | Freq: Every day | ORAL | 1 refills | Status: DC

## 2021-05-24 MED ORDER — FERROUS SULFATE 325 (65 FE) MG PO TBEC: 325.0000 mg | DELAYED_RELEASE_TABLET | Freq: Every day | ORAL | 1 refills | Status: DC

## 2021-05-24 MED ORDER — LOSARTAN POTASSIUM-HCTZ 100-25 MG PO TABS: 1.0000 | ORAL_TABLET | Freq: Every day | ORAL | 1 refills | Status: DC

## 2021-05-24 NOTE — Telephone Encounter (Signed)
Left message for pt to call me back 

## 2021-05-26 ENCOUNTER — Ambulatory Visit: Payer: Medicare Other | Admitting: Obstetrics

## 2021-05-26 NOTE — Telephone Encounter (Signed)
Left message for pt to call me back 

## 2021-06-02 ENCOUNTER — Ambulatory Visit (INDEPENDENT_AMBULATORY_CARE_PROVIDER_SITE_OTHER): Payer: Medicare Other | Admitting: Obstetrics

## 2021-06-02 ENCOUNTER — Encounter: Payer: Self-pay | Admitting: Obstetrics

## 2021-06-02 DIAGNOSIS — Z780 Asymptomatic menopausal state: Secondary | ICD-10-CM | POA: Diagnosis not present

## 2021-06-02 DIAGNOSIS — Z90710 Acquired absence of both cervix and uterus: Secondary | ICD-10-CM

## 2021-06-02 DIAGNOSIS — N393 Stress incontinence (female) (male): Secondary | ICD-10-CM

## 2021-06-02 DIAGNOSIS — R102 Pelvic and perineal pain: Secondary | ICD-10-CM | POA: Diagnosis not present

## 2021-06-02 DIAGNOSIS — E669 Obesity, unspecified: Secondary | ICD-10-CM

## 2021-06-02 NOTE — Progress Notes (Signed)
? ?TELEHEALTH GYNECOLOGY VISIT ENCOUNTER NOTE ? ?Provider location: [facility] at [place]  ? ?Patient location: Home ? ?I connected with Cheyenne Alvarez on 06/02/21 at  8:55 AM EDT by telephone and verified that I am speaking with the correct person using two identifiers. Patient was unable to do MyChart audiovisual encounter due to technical difficulties, she tried several times.  ?  ?I discussed the limitations, risks, security and privacy concerns of performing an evaluation and management service by telephone and the availability of in person appointments. I also discussed with the patient that there may be a patient responsible charge related to this service. The patient expressed understanding and agreed to proceed. ?  ?History:  ?Cheyenne Alvarez is a 56 y.o. [id number] female being evaluated today for pelvic pain.  Ultrasound done and she also presents for ultrasound results.  She denies any abnormal vaginal discharge, bleeding, pelvic pain or other concerns.   ?  ?  ?Past Medical History:  ?Diagnosis Date  ? Alopecia   ? Anemia   ? Anxiety   ? Bipolar 1 disorder ([place])   ? COPD (chronic obstructive pulmonary disease) ([place])   ? Coronary artery disease   ? Depression   ? Diabetes mellitus 2010  ? Gastritis   ? GERD (gastroesophageal reflux disease)   ? Headache   ? migraines  ? Hypertension 2010  ? Obesity   ? Pneumonia   ? PONV (postoperative nausea and vomiting)   ? PTSD (post-traumatic stress disorder)   ? Schizophrenia ([place])   ? Tobacco use   ? ?Past Surgical History:  ?Procedure Laterality Date  ? ABDOMINAL HYSTERECTOMY    ? heavy bleeding  ? CARDIAC CATHETERIZATION    ? CHOLECYSTECTOMY    ? COLONOSCOPY  05/14/14  ? hemorrhoids, otherwise normal; Dr. Wilford Corner  ? COLONOSCOPY WITH PROPOFOL N/A 05/14/2014  ? Procedure: COLONOSCOPY WITH PROPOFOL;  Surgeon: Wilford Corner, MD;  Location: [facility] ENDOSCOPY;  Service: Endoscopy;  Laterality: N/A;  ? ESOPHAGOGASTRODUODENOSCOPY (EGD)  WITH PROPOFOL N/A 05/14/2014  ? Procedure: ESOPHAGOGASTRODUODENOSCOPY (EGD) WITH PROPOFOL;  Surgeon: Wilford Corner, MD;  Location: [facility] ENDOSCOPY;  Service: Endoscopy;  Laterality: N/A;  ? FRACTURE SURGERY    ? left leg  ? LEG SURGERY    ? TUBAL LIGATION    ? ?The following portions of the patient's history were reviewed and updated as appropriate: allergies, current medications, past family history, past medical history, past social history, past surgical history and problem list.  ? ?Health Maintenance: Normal mammogram on 12-10-2020.  ? ?Review of Systems:  ?Pertinent items noted in HPI and remainder of comprehensive ROS otherwise negative. ? ?Physical Exam:  ? ?General:  Alert, oriented and cooperative.   ?Mental Status: Normal mood and affect perceived. Normal judgment and thought content.  ?Physical exam deferred due to nature of the encounter ? ?Labs and Imaging ?No results found for this or any previous visit (from the past 336 hour(s)). ?US PELVIC COMPLETE WITH TRANSVAGINAL ? ?Result Date: 05/13/2021 ?CLINICAL DATA:  Pelvic pain EXAM: TRANSABDOMINAL AND TRANSVAGINAL ULTRASOUND OF PELVIS DOPPLER ULTRASOUND OF OVARIES TECHNIQUE: Both transabdominal and transvaginal ultrasound examinations of the pelvis were performed. Transabdominal technique was performed for global imaging of the pelvis including uterus, ovaries, adnexal regions, and pelvic cul-de-sac. It was necessary to proceed with endovaginal exam following the transabdominal exam to visualize the ovaries. Color and duplex Doppler ultrasound was utilized to evaluate blood flow to the ovaries. COMPARISON:  None. FINDINGS: Uterus Uterus is not  seen consistent with hysterectomy. Right ovary Measurements: 3.6 x 1.9 x 2 cm = volume: 7.1 mL. There is 2.4 x 1.1 x 1.2 cm hypoechoic area, possibly hemorrhagic cyst/follicle. Left ovary Measurements: 3.9 x 2.4 x 2.3 cm = volume: 11 mL. There is 3.5 x 2.6 cm complex cyst, possibly hemorrhagic cyst/follicle. Pulsed  Doppler evaluation of both ovaries demonstrates normal low-resistance arterial and venous waveforms. Other findings No abnormal free fluid. IMPRESSION: Status post hysterectomy. There are no dominant adnexal masses. There is vascular flow in both adnexal regions. Electronically Signed   By: Elmer Picker M.D.   On: 05/13/2021 14:48      ? ? ?Assessment and Plan:  ?   ?  1. Pelvic pain ?- ultrasound is normal ?- will follow clinically.  Ibuprofen or Tylenol prn ? ?2. History of hysterectomy ? ?3. SUI (stress urinary incontinence, female) ?- referred to Urogynecology ?Rx: ?- POCT urinalysis dipstick ?- Urine Culture; Future ? ?4. Menopause ?- clinically stable ? ?5. Obesity (BMI 35.0-39.9 without comorbidity) ?  ?  ?I discussed the assessment and treatment plan with the patient. The patient was provided an opportunity to ask questions and all were answered. The patient agreed with the plan and demonstrated an understanding of the instructions. ?  ?The patient was advised to call back or seek an in-person evaluation/go to the ED if the symptoms worsen or if the condition fails to improve as anticipated. ? ?I have spent a total of 10 minutes of face-to-face time, excluding clinical staff time, reviewing notes and preparing to see patient, ordering tests and/or medications, and counseling the patient.  ? ? ?Baltazar Najjar, MD ?[facility] for [place], [place], [facility] ?06/02/21  ?

## 2021-06-03 ENCOUNTER — Ambulatory Visit (INDEPENDENT_AMBULATORY_CARE_PROVIDER_SITE_OTHER): Payer: Medicare Other | Admitting: *Deleted

## 2021-06-03 DIAGNOSIS — R102 Pelvic and perineal pain: Secondary | ICD-10-CM | POA: Diagnosis not present

## 2021-06-03 LAB — POCT URINALYSIS DIPSTICK
Bilirubin, UA: NEGATIVE
Blood, UA: NEGATIVE
Glucose, UA: NEGATIVE
Leukocytes, UA: NEGATIVE
Nitrite, UA: NEGATIVE
Protein, UA: NEGATIVE
Spec Grav, UA: 1.02 (ref 1.010–1.025)
Urobilinogen, UA: 0.2 E.U./dL
pH, UA: 5 (ref 5.0–8.0)

## 2021-06-03 NOTE — Progress Notes (Addendum)
Urine sample only. MyChart visit with Dr. Clearance Coots 06/02/21. He requested urine for dip and culture from patient. Orders placed. Dip performed. Sent for culture. ?

## 2021-06-07 LAB — URINE CULTURE

## 2021-06-08 ENCOUNTER — Other Ambulatory Visit: Payer: Self-pay | Admitting: Obstetrics

## 2021-06-08 DIAGNOSIS — N3000 Acute cystitis without hematuria: Secondary | ICD-10-CM

## 2021-06-08 MED ORDER — SULFAMETHOXAZOLE-TRIMETHOPRIM 800-160 MG PO TABS: 1.0000 | ORAL_TABLET | Freq: Two times a day (BID) | ORAL | 0 refills | Status: DC

## 2021-06-23 ENCOUNTER — Telehealth: Payer: Self-pay | Admitting: Medical

## 2021-06-23 NOTE — Telephone Encounter (Signed)
Daughter was notified handicap placard is available for pick up ?

## 2021-06-23 NOTE — Telephone Encounter (Signed)
Pt request this and needs this.  ? ?Also wants permanent handicap placard ?

## 2021-06-23 NOTE — Telephone Encounter (Signed)
I received a renewal on adult diapers and other supplies.  Please call to make sure this is legitimate and not a scam.  If so complete is much as you can and I will sign ?

## 2021-07-14 ENCOUNTER — Ambulatory Visit: Payer: Medicare Other | Admitting: Obstetrics

## 2021-07-14 DIAGNOSIS — R102 Pelvic and perineal pain: Secondary | ICD-10-CM

## 2021-07-15 NOTE — Progress Notes (Signed)
Patient did not answer phone

## 2021-07-21 ENCOUNTER — Other Ambulatory Visit: Payer: Self-pay | Admitting: Medical

## 2021-07-22 ENCOUNTER — Other Ambulatory Visit: Payer: Self-pay | Admitting: Medical

## 2021-09-20 ENCOUNTER — Other Ambulatory Visit: Payer: Self-pay | Admitting: Medical

## 2021-09-20 ENCOUNTER — Other Ambulatory Visit: Payer: Self-pay | Admitting: Surgery

## 2021-09-20 DIAGNOSIS — R1010 Upper abdominal pain, unspecified: Secondary | ICD-10-CM

## 2021-10-18 ENCOUNTER — Other Ambulatory Visit: Payer: Medicare Other

## 2021-10-19 ENCOUNTER — Other Ambulatory Visit: Payer: Medicare Other

## 2021-10-20 ENCOUNTER — Other Ambulatory Visit: Payer: Self-pay | Admitting: Medical

## 2021-10-21 ENCOUNTER — Ambulatory Visit (INDEPENDENT_AMBULATORY_CARE_PROVIDER_SITE_OTHER): Payer: Medicare Other

## 2021-10-21 VITALS — BP 104/73 | HR 91 | Ht 62.0 in | Wt 175.6 lb

## 2021-10-21 DIAGNOSIS — N3001 Acute cystitis with hematuria: Secondary | ICD-10-CM | POA: Diagnosis not present

## 2021-10-21 LAB — POCT URINALYSIS DIPSTICK
Bilirubin, UA: POSITIVE
Glucose, UA: NEGATIVE
Ketones, UA: NEGATIVE
Nitrite, UA: POSITIVE
Protein, UA: POSITIVE — AB
Spec Grav, UA: 1.025 (ref 1.010–1.025)
Urobilinogen, UA: 0.2 E.U./dL
pH, UA: 6 (ref 5.0–8.0)

## 2021-10-21 MED ORDER — PHENAZOPYRIDINE HCL 200 MG PO TABS: 200.0000 mg | ORAL_TABLET | Freq: Three times a day (TID) | ORAL | 0 refills | Status: DC | PRN

## 2021-10-21 MED ORDER — SULFAMETHOXAZOLE-TRIMETHOPRIM 800-160 MG PO TABS: 1.0000 | ORAL_TABLET | Freq: Two times a day (BID) | ORAL | 0 refills | Status: AC

## 2021-10-21 NOTE — Progress Notes (Signed)
SUBJECTIVE: Cheyenne Alvarez is a 56 y.o. female who complains of urinary frequency, urgency and dysuria x 7 days, without flank pain, fever, chills, or abnormal vaginal discharge or bleeding.   OBJECTIVE: Appears well, in no apparent distress.  Vital signs are normal. Urine dipstick shows positive for RBC's, positive for protein, positive for nitrates, and positive for leukocytes.    ASSESSMENT: Dysuria  PLAN: Treatment per orders.  Call or return to clinic prn if these symptoms worsen or fail to improve as anticipated.

## 2021-10-25 ENCOUNTER — Other Ambulatory Visit: Payer: Self-pay

## 2021-10-25 DIAGNOSIS — B9620 Unspecified Escherichia coli [E. coli] as the cause of diseases classified elsewhere: Secondary | ICD-10-CM

## 2021-10-25 LAB — URINE CULTURE

## 2021-10-25 MED ORDER — CEPHALEXIN 500 MG PO CAPS: 500.0000 mg | ORAL_CAPSULE | Freq: Four times a day (QID) | ORAL | 2 refills | Status: DC

## 2021-11-08 ENCOUNTER — Telehealth: Payer: Self-pay | Admitting: Medical

## 2021-11-08 NOTE — Telephone Encounter (Signed)
Left message for patient to call back and schedule Medicare Annual Wellness Visit (AWV) either virtually or in office. I left my number for patient to call [phone].  Last AWV ;11/24/20  please schedule at anytime with health coach

## 2021-11-09 ENCOUNTER — Telehealth: Payer: Self-pay | Admitting: Medical

## 2021-11-09 DIAGNOSIS — Z23 Encounter for immunization: Secondary | ICD-10-CM

## 2021-11-09 NOTE — Telephone Encounter (Signed)
Daughter/POA called & wants to bring pt in for flu shot and wanted to know if pt needs another pneumonia shot? Please advise

## 2021-11-11 ENCOUNTER — Inpatient Hospital Stay: Admission: RE | Admit: 2021-11-11 | Payer: Medicare Other | Source: Ambulatory Visit

## 2021-11-11 NOTE — Telephone Encounter (Signed)
Left message for [name] to call and schedule appt for flu & pneumonia shot

## 2021-11-12 NOTE — Telephone Encounter (Signed)
done

## 2021-11-24 ENCOUNTER — Other Ambulatory Visit: Payer: Self-pay | Admitting: Medical

## 2021-11-24 DIAGNOSIS — Z1231 Encounter for screening mammogram for malignant neoplasm of breast: Secondary | ICD-10-CM

## 2021-11-25 ENCOUNTER — Other Ambulatory Visit: Payer: Self-pay | Admitting: Family Medicine

## 2021-11-26 ENCOUNTER — Ambulatory Visit (INDEPENDENT_AMBULATORY_CARE_PROVIDER_SITE_OTHER): Payer: Medicare Other

## 2021-11-26 VITALS — Ht 62.0 in | Wt 167.0 lb

## 2021-11-26 DIAGNOSIS — Z0000 Encounter for general adult medical examination without abnormal findings: Secondary | ICD-10-CM | POA: Diagnosis not present

## 2021-11-26 NOTE — Progress Notes (Signed)
I connected with Cheyenne Alvarez today by telephone and verified that I am speaking with the correct person using two identifiers. Location patient: home Location provider: work Persons participating in the virtual visit: Cheyenne, Alvarez (daughter), Elisha Ponder LPN.   I discussed the limitations, risks, security and privacy concerns of performing an evaluation and management service by telephone and the availability of in person appointments. I also discussed with the patient that there may be a patient responsible charge related to this service. The patient expressed understanding and verbally consented to this telephonic visit.    Interactive audio and video telecommunications were attempted between this provider and patient, however failed, due to patient having technical difficulties OR patient did not have access to video capability.  We continued and completed visit with audio only.     Vital signs may be patient reported or missing.  Subjective:   Cheyenne Alvarez is a 56 y.o. female who presents for Medicare Annual (Subsequent) preventive examination.  Review of Systems     Cardiac Risk Factors include: diabetes mellitus;hypertension;obesity (BMI >30kg/m2)     Objective:    Today's Vitals   11/26/21 1011 11/26/21 1012  Weight: 167 lb (75.8 kg)   Height: 5\' 2"  (1.575 m)   PainSc:  8    Body mass index is 30.54 kg/m.     11/26/2021   10:25 AM 10/14/2019   11:11 AM 05/26/2019   12:12 AM 12/06/2016    3:53 PM 04/07/2015   12:28 PM 01/20/2015    1:40 PM 12/18/2014   10:05 AM  Advanced Directives  Does Patient Have a Medical Advance Directive? Yes No No No No No No  Type of 12/20/2014 of [place];Living will        Copy of Healthcare Power of Attorney in Chart? Yes - validated most recent copy scanned in chart (See row information)        Would patient like information on creating a medical advance directive?   No - Patient declined No -  Patient declined   No - patient declined information    Current Medications (verified) Outpatient Encounter Medications as of 11/26/2021  Medication Sig   ACCU-CHEK AVIVA PLUS test strip USE AS DIRECTED THREE TIMES DAILY   albuterol (VENTOLIN HFA) 108 (90 Base) MCG/ACT inhaler INHALE 2 PUFFS INTO THE LUNGS EVERY 6 HOURS AS NEEDED FOR WHEEZING OR SHORTNESS OF BREATH   amantadine (SYMMETREL) 100 MG capsule Take 100 mg by mouth 2 (two) times daily. FOR TREMORS   Amantadine HCl 100 MG tablet Take 100 mg by mouth 2 (two) times daily.   amLODipine (NORVASC) 10 MG tablet Take 1 tablet (10 mg total) by mouth daily. TAKE 1 TABLET(10 MG) BY MOUTH DAILY   atenolol (TENORMIN) 50 MG tablet Take 1 tablet by mouth twice daily   AUSTEDO 6 MG TABS Take 6 mg by mouth 2 (two) times daily.   BD PEN NEEDLE NANO 2ND GEN 32G X 4 MM MISC USE TWICE DAILY   benztropine (COGENTIN) 2 MG tablet Take 2 mg by mouth 2 (two) times daily. FOR TREMORS AND MOUTH MOVEMENTS   busPIRone (BUSPAR) 15 MG tablet Take 15 mg by mouth 2 (two) times daily. Reported on 04/20/2015   clonazePAM (KLONOPIN) 1 MG tablet Take 1 mg by mouth at bedtime as needed.   doxepin (SINEQUAN) 10 MG capsule Take 10 mg by mouth at bedtime.    ferrous sulfate 325 (65 FE) MG EC tablet Take 1 tablet (  325 mg total) by mouth daily.   FLUoxetine (PROZAC) 40 MG capsule Take 40 mg by mouth daily.   fluticasone-salmeterol (ADVAIR) 250-50 MCG/ACT AEPB Inhale 1 puff twice daily, morning & at bedtime   fluvoxaMINE (LUVOX) 25 MG tablet Take 25 mg by mouth 2 (two) times daily.   INGREZZA 80 MG capsule Take 80 mg by mouth daily.   losartan-hydrochlorothiazide (HYZAAR) 100-25 MG tablet Take 1 tablet by mouth daily.   metFORMIN (GLUCOPHAGE-XR) 500 MG 24 hr tablet Take 2 tablets (1,000 mg total) by mouth 2 (two) times daily with a meal.   nitroGLYCERIN (NITROSTAT) 0.4 MG SL tablet Place 0.4 mg under the tongue every 5 (five) minutes as needed for chest pain. Reported on  07/15/2015   ondansetron (ZOFRAN ODT) 4 MG disintegrating tablet Take 1 tablet (4 mg total) by mouth every 8 (eight) hours as needed for nausea or vomiting.   pantoprazole (PROTONIX) 40 MG tablet Take 1 capsule by mouth twice daily   rosuvastatin (CRESTOR) 20 MG tablet Take 1 tablet by mouth every day   Suvorexant (BELSOMRA) 20 MG TABS Take by mouth.   tiagabine (GABITRIL) 12 MG tablet Take 12 mg by mouth at bedtime.   tirzepatide (MOUNJARO) 7.5 MG/0.5ML Pen 7.5mg  Subcutaneous Once a week for 30 days   traZODone (DESYREL) 50 MG tablet Take 50 mg by mouth at bedtime.   cephALEXin (KEFLEX) 500 MG capsule Take 1 capsule (500 mg total) by mouth 4 (four) times daily. (Patient not taking: Reported on 11/26/2021)   Dulaglutide (TRULICITY) 1.5 MG/0.5ML SOPN Inject 1.5 mg into the skin once a week. (Patient not taking: Reported on 11/26/2021)   FANAPT 6 MG TABS Take 6 mg by mouth daily.  (Patient not taking: Reported on 11/26/2021)   HUMALOG MIX 75/25 KWIKPEN (75-25) 100 UNIT/ML Kwikpen INJECT 52 UNITS UNDER THE SKIN EVERY MORNING AND AT BEDTIME (Patient not taking: Reported on 11/26/2021)   INVEGA TRINZA 819 MG/2.625ML SUSP Inject 819 mg into the skin daily.  (Patient not taking: Reported on 11/26/2021)   loxapine (LOXITANE) 25 MG capsule Take 25 mg by mouth at bedtime.  (Patient not taking: Reported on 11/26/2021)   phenazopyridine (PYRIDIUM) 200 MG tablet Take 1 tablet (200 mg total) by mouth 3 (three) times daily as needed for pain (urethral spasm). (Patient not taking: Reported on 11/26/2021)   sulfamethoxazole-trimethoprim (BACTRIM DS) 800-160 MG tablet Take 1 tablet by mouth 2 (two) times daily. (Patient not taking: Reported on 10/21/2021)   No facility-administered encounter medications on file as of 11/26/2021.    Allergies (verified) Dexilant [dexlansoprazole], Famotidine, Meperidine hcl, Dilaudid [hydromorphone hcl], Gabapentin, Hydrocodone, Ramipril, Ziprasidone hcl, Invokana [canagliflozin], Iohexol,  and Tape   History: Past Medical History:  Diagnosis Date   Alopecia    Anemia    Anxiety    Bipolar 1 disorder (HCC)    COPD (chronic obstructive pulmonary disease) (HCC)    Coronary artery disease    Depression    Diabetes mellitus 2010   Gastritis    GERD (gastroesophageal reflux disease)    Headache    migraines   Hypertension 2010   Obesity    Pneumonia    PONV (postoperative nausea and vomiting)    PTSD (post-traumatic stress disorder)    Schizophrenia (HCC)    Tobacco use    Past Surgical History:  Procedure Laterality Date   ABDOMINAL HYSTERECTOMY     heavy bleeding   CARDIAC CATHETERIZATION     CHOLECYSTECTOMY     COLONOSCOPY  05/14/14   hemorrhoids, otherwise normal; Dr. Charlott Rakes   COLONOSCOPY WITH PROPOFOL N/A 05/14/2014   Procedure: COLONOSCOPY WITH PROPOFOL;  Surgeon: Charlott Rakes, MD;  Location: [facility] ENDOSCOPY;  Service: Endoscopy;  Laterality: N/A;   ESOPHAGOGASTRODUODENOSCOPY (EGD) WITH PROPOFOL N/A 05/14/2014   Procedure: ESOPHAGOGASTRODUODENOSCOPY (EGD) WITH PROPOFOL;  Surgeon: Charlott Rakes, MD;  Location: [facility] ENDOSCOPY;  Service: Endoscopy;  Laterality: N/A;   FRACTURE SURGERY     left leg   LEG SURGERY     TUBAL LIGATION     Family History  Problem Relation Age of Onset   Breast cancer Maternal Grandmother    Seizures Father    Diabetes Mother    Kidney failure Mother    Cancer Brother    Mental illness Brother    Breast cancer Sister 109   Diabetes Sister    Drug abuse Sister    Hypertension Sister    Diabetes Other    Cancer Other    Hypertension Other    Social History   Socioeconomic History   Marital status: Divorced    Spouse name: Not on file   Number of children: Not on file   Years of education: Not on file   Highest education level: Not on file  Occupational History   Not on file  Tobacco Use   Smoking status: Former    Types: Cigarettes    Quit date: 12/18/2013    Years since quitting: 7.9   Smokeless  tobacco: Never  Vaping Use   Vaping Use: Never used  Substance and Sexual Activity   Alcohol use: No    Alcohol/week: 0.0 standard drinks of alcohol   Drug use: No   Sexual activity: Not Currently    Partners: Male    Birth control/protection: Surgical  Other Topics Concern   Not on file  Social History Narrative   Not on file   Social Determinants of Health   Financial Resource Strain: Low Risk  (11/26/2021)   Overall Financial Resource Strain (CARDIA)    Difficulty of Paying Living Expenses: Not hard at all  Food Insecurity: No Food Insecurity (11/26/2021)   Hunger Vital Sign    Worried About Running Out of Food in the Last Year: Never true    Ran Out of Food in the Last Year: Never true  Transportation Needs: No Transportation Needs (11/26/2021)   PRAPARE - Administrator, Civil Service (Medical): No    Lack of Transportation (Non-Medical): No  Physical Activity: Inactive (11/26/2021)   Exercise Vital Sign    Days of Exercise per Week: 0 days    Minutes of Exercise per Session: 0 min  Stress: Stress Concern Present (11/26/2021)   [employer] of Occupational Health - Occupational Stress Questionnaire    Feeling of Stress : To some extent  Social Connections: Not on file    Tobacco Counseling Counseling given: Not Answered   Clinical Intake:  Pre-visit preparation completed: Yes  Pain : 0-10 Pain Score: 8  Pain Type: Chronic pain Pain Location: Leg Pain Orientation: Left, Right Pain Onset: More than a month ago Pain Frequency: Constant     Nutritional Status: BMI > 30  Obese Nutritional Risks: Nausea/ vomitting/ diarrhea (nausea from medication) Diabetes: Yes  How often do you need to have someone help you when you read instructions, pamphlets, or other written materials from your doctor or pharmacy?: 3 - Sometimes  Diabetic? Yes Nutrition Risk Assessment:  Has the patient had any N/V/D within the last  2 months?  Yes  Does the patient  have any non-healing wounds?  No  Has the patient had any unintentional weight loss or weight gain?  No   Diabetes:  Is the patient diabetic?  Yes  If diabetic, was a CBG obtained today?  No  Did the patient bring in their glucometer from home?  No  How often do you monitor your CBG's? daily.   Financial Strains and Diabetes Management:  Are you having any financial strains with the device, your supplies or your medication? No .  Does the patient want to be seen by Chronic Care Management for management of their diabetes?  No  Would the patient like to be referred to a Nutritionist or for Diabetic Management?  No   Diabetic Exams:  Diabetic Eye Exam: Overdue for diabetic eye exam. Pt has been advised about the importance in completing this exam. Patient advised to call and schedule an eye exam. Diabetic Foot Exam: Overdue, Pt has been advised about the importance in completing this exam. Pt is scheduled for diabetic foot exam on next appointment.   Interpreter Needed?: No  Information entered by :: NAllen LPN   Activities of Daily Living    11/26/2021   10:30 AM  In your present state of health, do you have any difficulty performing the following activities:  Hearing? 0  Vision? 1  Difficulty concentrating or making decisions? 1  Walking or climbing stairs? 1  Dressing or bathing? 1  Doing errands, shopping? 1  Preparing Food and eating ? Y  Using the Toilet? Y  In the past six months, have you accidently leaked urine? Y  Do you have problems with loss of bowel control? Y  Managing your Medications? Y  Managing your Finances? Y  Housekeeping or managing your Housekeeping? Y    Patient Care Team: Tysinger, Kermit Balo, PA-C as PCP - General (Family Medicine) End, Cristal Deer, MD as PCP - Cardiology (Cardiology)  Indicate any recent Medical Services you may have received from other than [facility] providers in the past year (date may be approximate).     Assessment:   This  is a routine wellness examination for Karolyn.  Hearing/Vision screen Vision Screening - Comments:: No regular eye exams, [facility]  Dietary issues and exercise activities discussed: Current Exercise Habits: The patient does not participate in regular exercise at present   Goals Addressed             This Visit's Progress    Patient Stated       11/26/2021, maintain a good a1C       Depression Screen    11/26/2021   10:27 AM 05/12/2021    8:42 AM 11/24/2020    9:22 AM 10/27/2020   10:21 AM 10/14/2019   11:14 AM 07/03/2018   10:04 AM 07/01/2016    2:33 PM  PHQ 2/9 Scores  PHQ - 2 Score 6 2 0 6 6 6  0  PHQ- 9 Score 9 12 0 24 12 19      Fall Risk    11/26/2021   10:26 AM 11/24/2020    9:22 AM 10/27/2020   10:21 AM 10/14/2019   11:13 AM 07/03/2018   10:04 AM  Fall Risk   Falls in the past year? 0 0 0 0 0  Number falls in past yr: 0 0 0    Injury with Fall? 0 0 0    Risk for fall due to : Impaired balance/gait;Medication side effect No  Fall Risks No Fall Risks    Follow up Falls prevention discussed;Education provided;Falls evaluation completed Falls evaluation completed Falls evaluation completed  Falls evaluation completed    FALL RISK PREVENTION PERTAINING TO THE HOME:  Any stairs in or around the home? Yes  If so, are there any without handrails? No  Home free of loose throw rugs in walkways, pet beds, electrical cords, etc? Yes  Adequate lighting in your home to reduce risk of falls? Yes   ASSISTIVE DEVICES UTILIZED TO PREVENT FALLS:  Life alert? No  Use of a cane, walker or w/c? No  Grab bars in the bathroom? No  Shower chair or bench in shower? Yes  Elevated toilet seat or a handicapped toilet? No   TIMED UP AND GO:  Was the test performed? No .      Cognitive Function:        11/26/2021   10:31 AM  6CIT Screen  What Year? 4 points  What month? 0 points  What time? 3 points  Count back from 20 4 points  Months in reverse 4 points  Repeat phrase  10 points  Total Score 25 points    Immunizations Immunization History  Administered Date(s) Administered   Influenza Whole 02/04/2010   Influenza,inj,Quad PF,6+ Mos 10/27/2020   Influenza-Unspecified 11/29/2014, 11/28/2017   Moderna SARS-COV2 Booster Vaccination 03/06/2020   Moderna Sars-Covid-2 Vaccination 05/09/2019, 06/11/2019   Pneumococcal Polysaccharide-23 10/27/2020   Td 02/29/2008    TDAP status: Due, Education has been provided regarding the importance of this vaccine. Advised may receive this vaccine at local pharmacy or Health Dept. Aware to provide a copy of the vaccination record if obtained from local pharmacy or Health Dept. Verbalized acceptance and understanding.  Flu Vaccine status: Due, Education has been provided regarding the importance of this vaccine. Advised may receive this vaccine at local pharmacy or Health Dept. Aware to provide a copy of the vaccination record if obtained from local pharmacy or Health Dept. Verbalized acceptance and understanding.  Pneumococcal vaccine status: Due, Education has been provided regarding the importance of this vaccine. Advised may receive this vaccine at local pharmacy or Health Dept. Aware to provide a copy of the vaccination record if obtained from local pharmacy or Health Dept. Verbalized acceptance and understanding.  Covid-19 vaccine status: Completed vaccines  Qualifies for Shingles Vaccine? Yes   Zostavax completed No   Shingrix Completed?: No.    Education has been provided regarding the importance of this vaccine. Patient has been advised to call insurance company to determine out of pocket expense if they have not yet received this vaccine. Advised may also receive vaccine at local pharmacy or Health Dept. Verbalized acceptance and understanding.  Screening Tests Health Maintenance  Topic Date Due   Zoster Vaccines- Shingrix (1 of 2) Never done   TETANUS/TDAP  02/28/2018   COVID-19 Vaccine (3 - Moderna risk  series) 04/03/2020   Diabetic kidney evaluation - Urine ACR  10/13/2020   FOOT EXAM  10/13/2020   OPHTHALMOLOGY EXAM  01/07/2021   HEMOGLOBIN A1C  05/25/2021   Diabetic kidney evaluation - GFR measurement  10/27/2021   MAMMOGRAM  12/11/2022   COLONOSCOPY (Pts [id number] Insurance coverage will need to be confirmed)  05/13/2024   Hepatitis C Screening  Completed   HIV Screening  Completed   HPV VACCINES  Aged Out   INFLUENZA VACCINE  Discontinued   PAP SMEAR-Modifier  Discontinued    Health Maintenance  Health Maintenance Due  Topic Date Due  Zoster Vaccines- Shingrix (1 of 2) Never done   TETANUS/TDAP  02/28/2018   COVID-19 Vaccine (3 - Moderna risk series) 04/03/2020   Diabetic kidney evaluation - Urine ACR  10/13/2020   FOOT EXAM  10/13/2020   OPHTHALMOLOGY EXAM  01/07/2021   HEMOGLOBIN A1C  05/25/2021   Diabetic kidney evaluation - GFR measurement  10/27/2021    Colorectal cancer screening: Type of screening: Colonoscopy. Completed 2020. Repeat every 10 years  Mammogram status: scheduled for 12/30/2021  Bone Density status: n/a  Lung Cancer Screening: (Low Dose CT Chest recommended if Age 12-80 years, 30 pack-year currently smoking OR have quit w/in 15years.) does not qualify.   Lung Cancer Screening Referral: no  Additional Screening:  Hepatitis C Screening: does qualify; Completed 10/27/2020  Vision Screening: Recommended annual ophthalmology exams for early detection of glaucoma and other disorders of the eye. Is the patient up to date with their annual eye exam?  No  Who is the provider or what is the name of the office in which the patient attends annual eye exams? [facility] If pt is not established with a provider, would they like to be referred to a provider to establish care? No .   Dental Screening: Recommended annual dental exams for proper oral hygiene  Community Resource Referral / Chronic Care Management: CRR required this visit?  No   CCM  required this visit?  No      Plan:     I have personally reviewed and noted the following in the patient's chart:   Medical and social history Use of alcohol, tobacco or illicit drugs  Current medications and supplements including opioid prescriptions. Patient is not currently taking opioid prescriptions. Functional ability and status Nutritional status Physical activity Advanced directives List of other physicians Hospitalizations, surgeries, and ER visits in previous 12 months Vitals Screenings to include cognitive, depression, and falls Referrals and appointments  In addition, I have reviewed and discussed with patient certain preventive protocols, quality metrics, and best practice recommendations. A written personalized care plan for preventive services as well as general preventive health recommendations were provided to patient.     Kellie Simmering, LPN   04/21/9796   Nurse Notes: none  Due to this being a virtual visit, the after visit summary with patients personalized plan was offered to patient via mail or my-chart.  Patient would like to access on my-chart

## 2021-11-26 NOTE — Patient Instructions (Signed)
Cheyenne Alvarez , Thank you for taking time to come for your Medicare Wellness Visit. I appreciate your ongoing commitment to your health goals. Please review the following plan we discussed and let me know if I can assist you in the future.   Screening recommendations/referrals: Colonoscopy: completed 2020 per daughter Mammogram: scheduled for 12/30/2021 Bone Density: n/a Recommended yearly ophthalmology/optometry visit for glaucoma screening and checkup Recommended yearly dental visit for hygiene and checkup  Vaccinations: Influenza vaccine: due Pneumococcal vaccine: n/a Tdap vaccine: due Shingles vaccine: discussed  Covid-19:  03/06/2020, 06/11/2019, 05/09/2019  Advanced directives: copy in chart  Conditions/risks identified: none  Next appointment: Follow up in one year for your annual wellness visit.   Preventive Care 40-64 Years, Female Preventive care refers to lifestyle choices and visits with your health care provider that can promote health and wellness. What does preventive care include? A yearly physical exam. This is also called an annual well check. Dental exams once or twice a year. Routine eye exams. Ask your health care provider how often you should have your eyes checked. Personal lifestyle choices, including: Daily care of your teeth and gums. Regular physical activity. Eating a healthy diet. Avoiding tobacco and drug use. Limiting alcohol use. Practicing safe sex. Taking low-dose aspirin daily starting at age 24. Taking vitamin and mineral supplements as recommended by your health care provider. What happens during an annual well check? The services and screenings done by your health care provider during your annual well check will depend on your age, overall health, lifestyle risk factors, and family history of disease. Counseling  Your health care provider may ask you questions about your: Alcohol use. Tobacco use. Drug use. Emotional well-being. Home and  relationship well-being. Sexual activity. Eating habits. Work and work Statistician. Method of birth control. Menstrual cycle. Pregnancy history. Screening  You may have the following tests or measurements: Height, weight, and BMI. Blood pressure. Lipid and cholesterol levels. These may be checked every 5 years, or more frequently if you are over 10 years old. Skin check. Lung cancer screening. You may have this screening every year starting at age 102 if you have a 30-pack-year history of smoking and currently smoke or have quit within the past 15 years. Fecal occult blood test (FOBT) of the stool. You may have this test every year starting at age 15. Flexible sigmoidoscopy or colonoscopy. You may have a sigmoidoscopy every 5 years or a colonoscopy every 10 years starting at age 52. Hepatitis C blood test. Hepatitis B blood test. Sexually transmitted disease (STD) testing. Diabetes screening. This is done by checking your blood sugar (glucose) after you have not eaten for a while (fasting). You may have this done every 1-3 years. Mammogram. This may be done every 1-2 years. Talk to your health care provider about when you should start having regular mammograms. This may depend on whether you have a family history of breast cancer. BRCA-related cancer screening. This may be done if you have a family history of breast, ovarian, tubal, or peritoneal cancers. Pelvic exam and Pap test. This may be done every 3 years starting at age 83. Starting at age 33, this may be done every 5 years if you have a Pap test in combination with an HPV test. Bone density scan. This is done to screen for osteoporosis. You may have this scan if you are at high risk for osteoporosis. Discuss your test results, treatment options, and if necessary, the need for more tests with your health  care provider. Vaccines  Your health care provider may recommend certain vaccines, such as: Influenza vaccine. This is recommended  every year. Tetanus, diphtheria, and acellular pertussis (Tdap, Td) vaccine. You may need a Td booster every 10 years. Zoster vaccine. You may need this after age 67. Pneumococcal 13-valent conjugate (PCV13) vaccine. You may need this if you have certain conditions and were not previously vaccinated. Pneumococcal polysaccharide (PPSV23) vaccine. You may need one or two doses if you smoke cigarettes or if you have certain conditions. Talk to your health care provider about which screenings and vaccines you need and how often you need them. This information is not intended to replace advice given to you by your health care provider. Make sure you discuss any questions you have with your health care provider. Document Released: 03/13/2015 Document Revised: 11/04/2015 Document Reviewed: 12/16/2014 Elsevier Interactive Patient Education  2017 [employer] Prevention in the Home Falls can cause injuries. They can happen to people of all ages. There are many things you can do to make your home safe and to help prevent falls. What can I do on the outside of my home? Regularly fix the edges of walkways and driveways and fix any cracks. Remove anything that might make you trip as you walk through a door, such as a raised step or threshold. Trim any bushes or trees on the path to your home. Use bright outdoor lighting. Clear any walking paths of anything that might make someone trip, such as rocks or tools. Regularly check to see if handrails are loose or broken. Make sure that both sides of any steps have handrails. Any raised decks and porches should have guardrails on the edges. Have any leaves, snow, or ice cleared regularly. Use sand or salt on walking paths during winter. Clean up any spills in your garage right away. This includes oil or grease spills. What can I do in the bathroom? Use night lights. Install grab bars by the toilet and in the tub and shower. Do not use towel bars as  grab bars. Use non-skid mats or decals in the tub or shower. If you need to sit down in the shower, use a plastic, non-slip stool. Keep the floor dry. Clean up any water that spills on the floor as soon as it happens. Remove soap buildup in the tub or shower regularly. Attach bath mats securely with double-sided non-slip rug tape. Do not have throw rugs and other things on the floor that can make you trip. What can I do in the bedroom? Use night lights. Make sure that you have a light by your bed that is easy to reach. Do not use any sheets or blankets that are too big for your bed. They should not hang down onto the floor. Have a firm chair that has side arms. You can use this for support while you get dressed. Do not have throw rugs and other things on the floor that can make you trip. What can I do in the kitchen? Clean up any spills right away. Avoid walking on wet floors. Keep items that you use a lot in easy-to-reach places. If you need to reach something above you, use a strong step stool that has a grab bar. Keep electrical cords out of the way. Do not use floor polish or wax that makes floors slippery. If you must use wax, use non-skid floor wax. Do not have throw rugs and other things on the floor that can  make you trip. What can I do with my stairs? Do not leave any items on the stairs. Make sure that there are handrails on both sides of the stairs and use them. Fix handrails that are broken or loose. Make sure that handrails are as long as the stairways. Check any carpeting to make sure that it is firmly attached to the stairs. Fix any carpet that is loose or worn. Avoid having throw rugs at the top or bottom of the stairs. If you do have throw rugs, attach them to the floor with carpet tape. Make sure that you have a light switch at the top of the stairs and the bottom of the stairs. If you do not have them, ask someone to add them for you. What else can I do to help prevent  falls? Wear shoes that: Do not have high heels. Have rubber bottoms. Are comfortable and fit you well. Are closed at the toe. Do not wear sandals. If you use a stepladder: Make sure that it is fully opened. Do not climb a closed stepladder. Make sure that both sides of the stepladder are locked into place. Ask someone to hold it for you, if possible. Clearly mark and make sure that you can see: Any grab bars or handrails. First and last steps. Where the edge of each step is. Use tools that help you move around (mobility aids) if they are needed. These include: Canes. Walkers. Scooters. Crutches. Turn on the lights when you go into a dark area. Replace any light bulbs as soon as they burn out. Set up your furniture so you have a clear path. Avoid moving your furniture around. If any of your floors are uneven, fix them. If there are any pets around you, be aware of where they are. Review your medicines with your doctor. Some medicines can make you feel dizzy. This can increase your chance of falling. Ask your doctor what other things that you can do to help prevent falls. This information is not intended to replace advice given to you by your health care provider. Make sure you discuss any questions you have with your health care provider. Document Released: 12/11/2008 Document Revised: 07/23/2015 Document Reviewed: 03/21/2014 Elsevier Interactive Patient Education  2017 [employer].

## 2021-11-28 ENCOUNTER — Other Ambulatory Visit: Payer: Self-pay | Admitting: Family Medicine

## 2021-11-29 ENCOUNTER — Other Ambulatory Visit (INDEPENDENT_AMBULATORY_CARE_PROVIDER_SITE_OTHER): Payer: Medicare Other

## 2021-11-29 DIAGNOSIS — Z23 Encounter for immunization: Secondary | ICD-10-CM

## 2021-12-04 ENCOUNTER — Other Ambulatory Visit: Payer: Self-pay | Admitting: Medical

## 2021-12-06 ENCOUNTER — Inpatient Hospital Stay: Admission: RE | Admit: 2021-12-06 | Payer: Medicare Other | Source: Ambulatory Visit

## 2021-12-07 ENCOUNTER — Encounter: Payer: Self-pay | Admitting: Internal Medicine

## 2021-12-26 ENCOUNTER — Other Ambulatory Visit: Payer: Self-pay | Admitting: Medical

## 2021-12-30 ENCOUNTER — Ambulatory Visit: Payer: Medicare Other

## 2022-01-14 ENCOUNTER — Ambulatory Visit
Admission: RE | Admit: 2022-01-14 | Discharge: 2022-01-14 | Disposition: A | Discharge status: Home / Self Care | Payer: Medicare Other | Source: Ambulatory Visit | Attending: Surgery | Admitting: Surgery

## 2022-01-14 DIAGNOSIS — R1010 Upper abdominal pain, unspecified: Secondary | ICD-10-CM

## 2022-01-18 ENCOUNTER — Other Ambulatory Visit: Payer: Self-pay | Admitting: Medical

## 2022-01-25 ENCOUNTER — Other Ambulatory Visit: Payer: Self-pay | Admitting: Medical

## 2022-02-15 ENCOUNTER — Ambulatory Visit: Payer: Medicare Other

## 2022-03-11 ENCOUNTER — Other Ambulatory Visit: Payer: Self-pay | Admitting: Medical

## 2022-03-11 NOTE — Telephone Encounter (Signed)
Is this okay to refill>? 

## 2022-03-19 ENCOUNTER — Other Ambulatory Visit: Payer: Self-pay | Admitting: Medical

## 2022-03-21 NOTE — Telephone Encounter (Signed)
Pt was notified and can wait until appointment tomorrow

## 2022-03-22 ENCOUNTER — Ambulatory Visit (INDEPENDENT_AMBULATORY_CARE_PROVIDER_SITE_OTHER): Payer: 59 | Admitting: Medical

## 2022-03-22 VITALS — BP 124/70 | HR 86 | Ht 64.0 in | Wt 176.2 lb

## 2022-03-22 DIAGNOSIS — I10 Essential (primary) hypertension: Secondary | ICD-10-CM

## 2022-03-22 DIAGNOSIS — M62830 Muscle spasm of back: Secondary | ICD-10-CM

## 2022-03-22 DIAGNOSIS — E1165 Type 2 diabetes mellitus with hyperglycemia: Secondary | ICD-10-CM | POA: Diagnosis not present

## 2022-03-22 DIAGNOSIS — D509 Iron deficiency anemia, unspecified: Secondary | ICD-10-CM

## 2022-03-22 DIAGNOSIS — R251 Tremor, unspecified: Secondary | ICD-10-CM

## 2022-03-22 DIAGNOSIS — F4310 Post-traumatic stress disorder, unspecified: Secondary | ICD-10-CM

## 2022-03-22 DIAGNOSIS — F411 Generalized anxiety disorder: Secondary | ICD-10-CM

## 2022-03-22 DIAGNOSIS — Z741 Need for assistance with personal care: Secondary | ICD-10-CM

## 2022-03-22 DIAGNOSIS — Z794 Long term (current) use of insulin: Secondary | ICD-10-CM

## 2022-03-22 DIAGNOSIS — K219 Gastro-esophageal reflux disease without esophagitis: Secondary | ICD-10-CM | POA: Diagnosis not present

## 2022-03-22 DIAGNOSIS — Z79899 Other long term (current) drug therapy: Secondary | ICD-10-CM

## 2022-03-22 DIAGNOSIS — F319 Bipolar disorder, unspecified: Secondary | ICD-10-CM

## 2022-03-22 DIAGNOSIS — E559 Vitamin D deficiency, unspecified: Secondary | ICD-10-CM

## 2022-03-22 DIAGNOSIS — Z0000 Encounter for general adult medical examination without abnormal findings: Secondary | ICD-10-CM | POA: Diagnosis not present

## 2022-03-22 DIAGNOSIS — E1169 Type 2 diabetes mellitus with other specified complication: Secondary | ICD-10-CM

## 2022-03-22 DIAGNOSIS — T783XXD Angioneurotic edema, subsequent encounter: Secondary | ICD-10-CM | POA: Diagnosis not present

## 2022-03-22 DIAGNOSIS — E8941 Symptomatic postprocedural ovarian failure: Secondary | ICD-10-CM

## 2022-03-22 DIAGNOSIS — R748 Abnormal levels of other serum enzymes: Secondary | ICD-10-CM | POA: Diagnosis not present

## 2022-03-22 DIAGNOSIS — R32 Unspecified urinary incontinence: Secondary | ICD-10-CM

## 2022-03-22 DIAGNOSIS — Z789 Other specified health status: Secondary | ICD-10-CM

## 2022-03-22 DIAGNOSIS — E785 Hyperlipidemia, unspecified: Secondary | ICD-10-CM

## 2022-03-22 DIAGNOSIS — M791 Myalgia, unspecified site: Secondary | ICD-10-CM

## 2022-03-22 DIAGNOSIS — T464X5D Adverse effect of angiotensin-converting-enzyme inhibitors, subsequent encounter: Secondary | ICD-10-CM

## 2022-03-22 DIAGNOSIS — J449 Chronic obstructive pulmonary disease, unspecified: Secondary | ICD-10-CM | POA: Diagnosis not present

## 2022-03-22 DIAGNOSIS — Z7185 Encounter for immunization safety counseling: Secondary | ICD-10-CM

## 2022-03-22 DIAGNOSIS — M2550 Pain in unspecified joint: Secondary | ICD-10-CM

## 2022-03-22 DIAGNOSIS — F250 Schizoaffective disorder, bipolar type: Secondary | ICD-10-CM

## 2022-03-22 LAB — POCT URINALYSIS DIP (PROADVANTAGE DEVICE)
Bilirubin, UA: NEGATIVE
Blood, UA: NEGATIVE
Glucose, UA: NEGATIVE mg/dL
Ketones, POC UA: NEGATIVE mg/dL
Leukocytes, UA: NEGATIVE
Nitrite, UA: NEGATIVE
Protein Ur, POC: NEGATIVE mg/dL
Specific Gravity, Urine: 1.02
Urobilinogen, Ur: NEGATIVE
pH, UA: 6 (ref 5.0–8.0)

## 2022-03-22 LAB — LDL CHOLESTEROL, DIRECT

## 2022-03-22 NOTE — Progress Notes (Signed)
Subjective:    Cheyenne Alvarez is a 57 y.o. female who presents for Preventative Services visit and chronic medical problems/med check visit.    Here with daughter [facility]  Primary Care Provider Kaheem Halleck, Kermit Balo, PA-C here for primary care  Current Health Care Team: Dentist, - has dentures Eye doctor, [facility] Raelyn Mora CNM with gynecology Dr. Sharyn Lull, cardiology Dr. Omelia Blackwater, psychiatry Dr. Charlott Rakes Dr. Twana First, general surgery Endocrinology Georga Kaufmann, PA, hematology   Past Medical History:  Diagnosis Date   Alopecia    Anemia    Anxiety    Bipolar 1 disorder (HCC)    COPD (chronic obstructive pulmonary disease) (HCC)    Coronary artery disease    Depression    Diabetes mellitus 2010   Gastritis    GERD (gastroesophageal reflux disease)    Headache    migraines   Hypertension 2010   Obesity    Pneumonia    PONV (postoperative nausea and vomiting)    PTSD (post-traumatic stress disorder)    Schizophrenia (HCC)    Tobacco use     Past Surgical History:  Procedure Laterality Date   ABDOMINAL HYSTERECTOMY     heavy bleeding   CARDIAC CATHETERIZATION     CHOLECYSTECTOMY     COLONOSCOPY  05/14/14   hemorrhoids, otherwise normal; Dr. Charlott Rakes   COLONOSCOPY WITH PROPOFOL N/A 05/14/2014   Procedure: COLONOSCOPY WITH PROPOFOL;  Surgeon: Charlott Rakes, MD;  Location: [facility] ENDOSCOPY;  Service: Endoscopy;  Laterality: N/A;   ESOPHAGOGASTRODUODENOSCOPY (EGD) WITH PROPOFOL N/A 05/14/2014   Procedure: ESOPHAGOGASTRODUODENOSCOPY (EGD) WITH PROPOFOL;  Surgeon: Charlott Rakes, MD;  Location: [facility] ENDOSCOPY;  Service: Endoscopy;  Laterality: N/A;   FRACTURE SURGERY     left leg   LEG SURGERY     TUBAL LIGATION       Family History  Problem Relation Age of Onset   Breast cancer Maternal Grandmother    Seizures Father    Diabetes Mother    Kidney failure Mother    Cancer Brother    Mental illness Brother    Breast  cancer Sister 64   Diabetes Sister    Drug abuse Sister    Hypertension Sister    Diabetes Other    Cancer Other    Hypertension Other      Current Outpatient Medications:    albuterol (VENTOLIN HFA) 108 (90 Base) MCG/ACT inhaler, INHALE 2 PUFFS INTO THE LUNGS EVERY 6 HOURS AS NEEDED FOR WHEEZING OR SHORTNESS OF BREATH, Disp: 54 g, Rfl: 0   amLODipine (NORVASC) 10 MG tablet, Take 1 tablet (10 mg total) by mouth daily. TAKE 1 TABLET(10 MG) BY MOUTH DAILY, Disp: 90 tablet, Rfl: 1   atenolol (TENORMIN) 50 MG tablet, Take 1 tablet by mouth twice daily, Disp: 60 tablet, Rfl: 0   ferrous sulfate 325 (65 FE) MG EC tablet, Take 1 tablet (325 mg total) by mouth daily., Disp: 90 tablet, Rfl: 1   fluticasone-salmeterol (ADVAIR) 250-50 MCG/ACT AEPB, Inhale 1 puff twice daily, morning & at bedtime, Disp: 60 each, Rfl: 0   fluvoxaMINE (LUVOX) 25 MG tablet, Take 25 mg by mouth 2 (two) times daily., Disp: , Rfl:    INGREZZA 80 MG capsule, Take 80 mg by mouth daily., Disp: , Rfl:    nitroGLYCERIN (NITROSTAT) 0.4 MG SL tablet, Place 0.4 mg under the tongue every 5 (five) minutes as needed for chest pain. Reported on 07/15/2015, Disp: , Rfl:    pantoprazole (PROTONIX) 40  MG tablet, Take 1 capsule by mouth twice daily, Disp: 60 tablet, Rfl: 2   rosuvastatin (CRESTOR) 20 MG tablet, Take 1 tablet by mouth every day, Disp: 30 tablet, Rfl: 0   Suvorexant (BELSOMRA) 20 MG TABS, Take by mouth., Disp: , Rfl:    tirzepatide (MOUNJARO) 7.5 MG/0.5ML Pen, 7.5mg  Subcutaneous Once a week for 30 days, Disp: , Rfl:    traZODone (DESYREL) 50 MG tablet, Take 50 mg by mouth at bedtime., Disp: , Rfl:    losartan-hydrochlorothiazide (HYZAAR) 100-25 MG tablet, Take 1 tablet by mouth daily. (Patient not taking: Reported on 03/22/2022), Disp: 90 tablet, Rfl: 1  Allergies  Allergen Reactions   Dexilant [Dexlansoprazole] Shortness Of Breath, Diarrhea, Nausea And Vomiting and Other (See Comments)    Chest pain and abdominal pain  (also)   Famotidine Anaphylaxis   Meperidine Hcl Anaphylaxis   Dilaudid [Hydromorphone Hcl] Other (See Comments)    Change in mental state   Gabapentin Other (See Comments)    Memory loss   Hydrocodone Nausea And Vomiting   Ramipril Swelling and Other (See Comments)    Angioedema    Ziprasidone Hcl Other (See Comments)    Caused convulsions   Invokana [Canagliflozin] Rash   Iohexol Itching and Rash   Tape Rash    Use paper tape only     History reviewed: allergies, current medications, past family history, past medical history, past social history, past surgical history and problem list  Chronic issues discussed: Diabetes-seeing endocrinology.  Compliant with Mounjaro.  Has done well with this medication.  Hypertension-compliant with medications without complaint, however she says endocrinology stopped her losartan HCT so she has not been taking the.  She is not sure why this was stopped  Hyperlipidemia-compliant with medications without complaint  Sees psychiatry regularly, compliant with medications  Acute issues discussed: Having some low back muscles, fell out of the bed last week.     Feels swollen in arms, legs, that just started.  No recent salt intake.  No chest pain, no dyspnea.  No joint sweling.  Has pain in both knees and feet   Objective:    Biometrics BP 124/70   Pulse 86   Ht 5\' 4"  (1.626 m)   Wt 176 lb 3.2 oz (79.9 kg)   BMI 30.24 kg/m   BP Readings from Last 3 Encounters:  03/22/22 124/70  10/21/21 104/73  05/12/21 135/83   Wt Readings from Last 3 Encounters:  03/22/22 176 lb 3.2 oz (79.9 kg)  11/26/21 167 lb (75.8 kg)  10/21/21 175 lb 9.6 oz (79.7 kg)   General: Well-developed well-nourished no acute distress Alert, oriented to person and place Displays relatively appropriate judgment as she does rely on daughter and caregiver to manage her day-to-day affairs, medications, finances etc. HEENT: normocephalic, sclerae anicteric, TMs pearly,  nares patent, no discharge or erythema, pharynx normal Oral cavity: MMM, no lesions Neck: supple, no lymphadenopathy, no thyromegaly, no masses, no bruits Heart: RRR, normal S1, S2, no murmurs Lungs: CTA bilaterally, no wheezes, rhonchi, or rales Abdomen: +bs, soft, non tender, non distended, no masses, no hepatomegaly, no splenomegaly Back nontender lumbar spine, mild pain with range of motion which is about 80% of normal Musculoskeletal: No obvious deformity, nontender, no swelling, no obvious deformity Extremities: no edema, no cyanosis, no clubbing Pulses: 2+ symmetric, upper and lower extremities, normal cap refill Neurological: alert, oriented x 3, CN2-12 intact, strength normal upper extremities and lower extremities, sensation normal throughout, DTRs 2+ throughout, no cerebellar signs,  gait normal Psychiatric: normal affect, behavior normal, pleasant  Breast, pelvic, rectal-deferred to gynecology  Diabetic Foot Exam - Simple   Simple Foot Form Diabetic Foot exam was performed with the following findings: Yes 03/22/2022 10:43 AM  Visual Inspection No deformities, no ulcerations, no other skin breakdown bilaterally: Yes Sensation Testing Intact to touch and monofilament testing bilaterally: Yes Pulse Check Posterior Tibialis and Dorsalis pulse intact bilaterally: Yes Comments      Assessment:   Encounter Diagnoses  Name Primary?   Encounter for health maintenance examination in adult Yes   Essential hypertension    Chronic obstructive pulmonary disease, unspecified COPD type ([place])    Gastroesophageal reflux disease without esophagitis    Hyperlipidemia associated with type 2 diabetes mellitus (HCC)    Type 2 diabetes mellitus with hyperglycemia, with long-term current use of insulin (HCC)    Iron deficiency anemia, unspecified iron deficiency anemia type    Angiotensin converting enzyme inhibitor-aggravated angioedema, subsequent encounter    Bipolar affective disorder,  remission status unspecified ([place])    Decreased activities of daily living (ADL)    Elevated alkaline phosphatase level    Generalized anxiety disorder    High risk medication use    Incontinence in female    MENOPAUSE, SURGICAL    POST TRAUMATIC STRESS SYNDROME    Requires daily assistance for activities of daily living (ADL) and comfort needs    Schizoaffective disorder, bipolar type (HCC)    Vitamin D deficiency    Vaccine counseling    Tremor    Myalgia    Polyarthralgia    Back spasm       Plan:    Recommendations: Continue to return yearly for your annual wellness and preventative care visits.  This gives [place] a chance to discuss healthy lifestyle, exercise, vaccinations, review your chart record, and perform screenings where appropriate.  I recommend you see your eye doctor yearly for routine vision care.  I recommend you see your dentist yearly for routine dental care including hygiene visits twice yearly.   Vaccination recommendations were reviewed Immunization History  Administered Date(s) Administered   Influenza Whole 02/04/2010   Influenza,inj,Quad PF,6+ Mos 10/27/2020, 11/29/2021   Influenza-Unspecified 11/29/2014, 11/28/2017   Moderna SARS-COV2 Booster Vaccination 03/06/2020   Moderna Sars-Covid-2 Vaccination 05/09/2019, 06/11/2019   PNEUMOCOCCAL CONJUGATE-20 11/29/2021   Pneumococcal Polysaccharide-23 10/27/2020   Td 02/29/2008   I recommend you get a tetanus booster vaccine and shingles vaccine at your local pharmacy  She declines shingrix.  She will get Tdap at pharmacy given new grandchild  You are up-to-date on influenza and pneumococcal vaccine   Screening for cancer: Colon cancer screening: I reviewed your colonoscopy on file that is up to date from 2016 Stool cards were negative August 2022  Breast cancer screening: You should perform a self breast exam monthly.   We reviewed recommendations for regular mammograms and breast cancer  screening.  Cervical cancer screening: You have had hysterectomy, so you no longer need Pap smear test   Skin cancer screening: Check your skin regularly for new changes, growing lesions, or other lesions of concern Come in for evaluation if you have skin lesions of concern.  Lung cancer screening: If you have a greater than 20 pack year history of tobacco use, then you may qualify for lung cancer screening with a chest CT scan.   Please call your insurance company to inquire about coverage for this test.  We currently don't have screenings for other cancers besides breast, cervical,  colon, and lung cancers.  If you have a strong family history of cancer or have other cancer screening concerns, please let me know.    Bone health: Get at least 150 minutes of aerobic exercise weekly Get weight bearing exercise at least once weekly Bone density test:  A bone density test is an imaging test that uses a type of X-ray to measure the amount of calcium and other minerals in your bones. The test may be used to diagnose or screen you for a condition that causes weak or thin bones (osteoporosis), predict your risk for a broken bone (fracture), or determine how well your osteoporosis treatment is working. The bone density test is recommended for females 65 and older, or females or males <65 if certain risk factors such as thyroid disease, long term use of steroids such as for asthma or rheumatological issues, vitamin D deficiency, estrogen deficiency, family history of osteoporosis, self or family history of fragility fracture in first degree relative.    Heart health: Get at least 150 minutes of aerobic exercise weekly Limit alcohol It is important to maintain a healthy blood pressure and healthy cholesterol numbers  Heart disease screening: Screening for heart disease includes screening for blood pressure, fasting lipids, glucose/diabetes screening, BMI height to weight ratio, reviewed of  smoking status, physical activity, and diet.    Goals include blood pressure 120/80 or less, maintaining a healthy lipid/cholesterol profile, preventing diabetes or keeping diabetes numbers under good control, not smoking or using tobacco products, exercising most days per week or at least 150 minutes per week of exercise, and eating healthy variety of fruits and vegetables, healthy oils, and avoiding unhealthy food choices like fried food, fast food, high sugar and high cholesterol foods.      Medical care options: I recommend you continue to seek care here first for routine care.  We try really hard to have available appointments Monday through Friday daytime hours for sick visits, acute visits, and physicals.  Urgent care should be used for after hours and weekends for significant issues that cannot wait till the next day.  The emergency department should be used for significant potentially life-threatening emergencies.  The emergency department is expensive, can often have long wait times for less significant concerns, so try to utilize primary care, urgent care, or telemedicine when possible to avoid unnecessary trips to the emergency department.  Virtual visits and telemedicine have been introduced since the pandemic started in 2020, and can be convenient ways to receive medical care.  We offer virtual appointments as well to assist you in a variety of options to seek medical care.   Advanced Directives: I recommend you consider completing a Health Care Power of Attorney and Living Will.   These documents respect your wishes and help alleviate burdens on your loved ones if you were to become terminally ill or be in a position to need those documents enforced.    You can complete Advanced Directives yourself, have them notarized, then have copies made for our office, for you and for anybody you feel should have them in safe keeping.  Or, you can have an attorney prepare these documents.   If you  haven't updated your Last Will and Testament in a while, it may be worthwhile having an attorney prepare these documents together and save on some costs.       Significant separate issues: Diabetes -managed by endocrinology, updated labs today.  Her next visit with him was in March 2024  Hypertension/high blood pressure-blood pressure is okay today.  Currently on amlodipine 10 mg and atenolol 50 mg daily.  She notes losartan HCT was discontinued by endocrinology.  She has had prior swelling with lisinopril.  Hyperlipidemia Continue Crestor 20 mg daily Nonfasting today, LDL direct lab done instead of full lipid panel  Continue to see your psychiatrist for your mental health medications  Vitamin D deficiency-updated labs today  History of iron deficiency anemia-updated labs today  COPD-doing fine on Advair, uses albuterol as needed  GERD-she feels like she continues to need her Protonix, would like refill today  You continue with home health aide daily and your daughter [name] continues to help you manage your medications and care  Back pain, back spasm, joint pain-advise routine exercise and regular stretching.  Shareka was seen today for nonfasting cpe.  Diagnoses and all orders for this visit:  Encounter for health maintenance examination in adult -     Comprehensive metabolic panel -     CBC with Differential/Platelet -     Microalbumin/Creatinine Ratio, Urine -     POCT Urinalysis DIP (Proadvantage Device) -     Hemoglobin A1c -     Iron, TIBC and Ferritin Panel -     LDL Cholesterol, Direct -     MM DIGITAL SCREENING BILATERAL; Future -     VITAMIN D 25 Hydroxy (Vit-D Deficiency, Fractures) -     Sedimentation rate -     CK  Essential hypertension  Chronic obstructive pulmonary disease, unspecified COPD type (HCC)  Gastroesophageal reflux disease without esophagitis  Hyperlipidemia associated with type 2 diabetes mellitus (HCC) -     LDL Cholesterol, Direct  Type  2 diabetes mellitus with hyperglycemia, with long-term current use of insulin (HCC) -     Microalbumin/Creatinine Ratio, Urine -     POCT Urinalysis DIP (Proadvantage Device) -     Hemoglobin A1c  Iron deficiency anemia, unspecified iron deficiency anemia type -     CBC with Differential/Platelet -     Iron, TIBC and Ferritin Panel  Angiotensin converting enzyme inhibitor-aggravated angioedema, subsequent encounter  Bipolar affective disorder, remission status unspecified (HCC)  Decreased activities of daily living (ADL)  Elevated alkaline phosphatase level -     VITAMIN D 25 Hydroxy (Vit-D Deficiency, Fractures)  Generalized anxiety disorder  High risk medication use  Incontinence in female  MENOPAUSE, SURGICAL  POST TRAUMATIC STRESS SYNDROME  Requires daily assistance for activities of daily living (ADL) and comfort needs  Schizoaffective disorder, bipolar type (HCC)  Vitamin D deficiency -     VITAMIN D 25 Hydroxy (Vit-D Deficiency, Fractures)  Vaccine counseling  Tremor  Myalgia -     Sedimentation rate -     CK  Polyarthralgia -     Sedimentation rate -     CK  Back spasm    F/u pending labs, 48mo

## 2022-03-23 ENCOUNTER — Telehealth: Payer: Self-pay | Admitting: Internal Medicine

## 2022-03-23 ENCOUNTER — Other Ambulatory Visit: Payer: Self-pay | Admitting: Medical

## 2022-03-23 LAB — CBC WITH DIFFERENTIAL/PLATELET
Basophils Absolute: 0 10*3/uL (ref 0.0–0.2)
Basos: 0 %
EOS (ABSOLUTE): 0.2 10*3/uL (ref 0.0–0.4)
Eos: 2 %
Hematocrit: 33.5 % — ABNORMAL LOW (ref 34.0–46.6)
Hemoglobin: 10.8 g/dL — ABNORMAL LOW (ref 11.1–15.9)
Immature Grans (Abs): 0 10*3/uL (ref 0.0–0.1)
Immature Granulocytes: 0 %
Lymphocytes Absolute: 3.1 10*3/uL (ref 0.7–3.1)
Lymphs: 33 %
MCH: 26.7 pg (ref 26.6–33.0)
MCHC: 32.2 g/dL (ref 31.5–35.7)
MCV: 83 fL (ref 79–97)
Monocytes Absolute: 0.5 10*3/uL (ref 0.1–0.9)
Monocytes: 5 %
Neutrophils Absolute: 5.5 10*3/uL (ref 1.4–7.0)
Neutrophils: 60 %
Platelets: 338 10*3/uL (ref 150–450)
RBC: 4.05 x10E6/uL (ref 3.77–5.28)
RDW: 13.9 % (ref 11.7–15.4)
WBC: 9.3 10*3/uL (ref 3.4–10.8)

## 2022-03-23 LAB — COMPREHENSIVE METABOLIC PANEL
ALT: 12 IU/L (ref 0–32)
AST: 10 IU/L (ref 0–40)
Albumin/Globulin Ratio: 1.3 (ref 1.2–2.2)
Albumin: 3.5 g/dL — ABNORMAL LOW (ref 3.8–4.9)
Alkaline Phosphatase: 94 IU/L (ref 44–121)
BUN/Creatinine Ratio: 9 (ref 9–23)
BUN: 10 mg/dL (ref 6–24)
Bilirubin Total: <0.2 mg/dL (ref 0.0–1.2)
CO2: 21 mmol/L (ref 20–29)
Calcium: 9.4 mg/dL (ref 8.7–10.2)
Chloride: 103 mmol/L (ref 96–106)
Creatinine, Ser: 1.07 mg/dL — ABNORMAL HIGH (ref 0.57–1.00)
Globulin, Total: 2.6 g/dL (ref 1.5–4.5)
Glucose: 152 mg/dL — ABNORMAL HIGH (ref 70–99)
Potassium: 4.7 mmol/L (ref 3.5–5.2)
Sodium: 139 mmol/L (ref 134–144)
Total Protein: 6.1 g/dL (ref 6.0–8.5)
eGFR: 61 mL/min/{1.73_m2} (ref >59)

## 2022-03-23 LAB — HEMOGLOBIN A1C
Est. average glucose Bld gHb Est-mCnc: 134 mg/dL
Hgb A1c MFr Bld: 6.3 % — ABNORMAL HIGH (ref 4.8–5.6)

## 2022-03-23 LAB — MICROALBUMIN / CREATININE URINE RATIO
Creatinine, Urine: 169.7 mg/dL
Microalb/Creat Ratio: 4 mg/g creat (ref 0–29)
Microalbumin, Urine: 7.3 ug/mL

## 2022-03-23 LAB — IRON,TIBC AND FERRITIN PANEL
Ferritin: 71 ng/mL (ref 15–150)
Iron Saturation: 20 % (ref 15–55)
Iron: 64 ug/dL (ref 27–159)
Total Iron Binding Capacity: 313 ug/dL (ref 250–450)
UIBC: 249 ug/dL (ref 131–425)

## 2022-03-23 LAB — LDL CHOLESTEROL, DIRECT: LDL Direct: 105 mg/dL — ABNORMAL HIGH (ref 0–99)

## 2022-03-23 LAB — VITAMIN D 25 HYDROXY (VIT D DEFICIENCY, FRACTURES): Vit D, 25-Hydroxy: 26.8 ng/mL — ABNORMAL LOW (ref 30.0–100.0)

## 2022-03-23 LAB — SEDIMENTATION RATE: Sed Rate: 29 mm/hr (ref 0–40)

## 2022-03-23 LAB — CK: Total CK: 76 U/L (ref 32–182)

## 2022-03-23 MED ORDER — AMLODIPINE BESYLATE 10 MG PO TABS: 10.0000 mg | ORAL_TABLET | Freq: Every day | ORAL | 3 refills | Status: DC

## 2022-03-23 MED ORDER — FERROUS SULFATE 325 (65 FE) MG PO TBEC: 325.0000 mg | DELAYED_RELEASE_TABLET | Freq: Every day | ORAL | 1 refills | Status: DC

## 2022-03-23 MED ORDER — VITAMIN D 50 MCG (2000 UT) PO CAPS: 1.0000 | ORAL_CAPSULE | Freq: Every day | ORAL | 3 refills | Status: DC

## 2022-03-23 MED ORDER — FLUTICASONE-SALMETEROL 250-50 MCG/ACT IN AEPB: INHALATION_SPRAY | RESPIRATORY_TRACT | 11 refills | Status: DC

## 2022-03-23 MED ORDER — ROSUVASTATIN CALCIUM 20 MG PO TABS: 20.0000 mg | ORAL_TABLET | Freq: Every day | ORAL | 3 refills | Status: DC

## 2022-03-23 MED ORDER — ATENOLOL 50 MG PO TABS: 50.0000 mg | ORAL_TABLET | Freq: Two times a day (BID) | ORAL | 3 refills | Status: DC

## 2022-03-23 NOTE — Telephone Encounter (Signed)
Pt called and wants all her meds refilled that you refill for her. To [facility]

## 2022-03-23 NOTE — Progress Notes (Signed)
Labs show mild anemia, diabetes marker stable at 6.3%, iron was okay, urine findings okay.  Still pending microalbumin kidney marker.  Regarding anemia, if any blood in stool, then we need to get back into gastroenterology.    Vitamin D level is low. I would like you to begin prescription Vitamin D 2000 IU daily.  We will plan to check vitamin D periodically along with calcium, such as repeating labs in 3 months.  Foods that contain vitamin D include seafood such as oysters, shrimp, salmon, herring, cod, egg yolks, mushrooms, milk, and foods fortified with Vitamin D such as orange juice, cereals.  Its also important to get some sun exposure regularly to absorb vitamin D.  Continue current medications.   Follow up with diabetes doctor as planned.  Follow up here in 6 months for med check, yearly for well visit.

## 2022-03-23 NOTE — Progress Notes (Signed)
Results sent through MyChart

## 2022-03-25 ENCOUNTER — Encounter: Payer: Self-pay | Admitting: Internal Medicine

## 2022-03-26 ENCOUNTER — Other Ambulatory Visit: Payer: Self-pay | Admitting: Medical

## 2022-04-06 ENCOUNTER — Telehealth: Payer: Self-pay | Admitting: Medical

## 2022-04-06 NOTE — Telephone Encounter (Signed)
Left message for pt to call me back to see if she needs this to go to another pharmacy

## 2022-04-06 NOTE — Telephone Encounter (Signed)
Recv'd call from [place] requesting refill on Atenolol 50mg 

## 2022-04-08 MED ORDER — FLUTICASONE-SALMETEROL 250-50 MCG/ACT IN AEPB: INHALATION_SPRAY | RESPIRATORY_TRACT | 11 refills | Status: DC

## 2022-04-08 MED ORDER — VITAMIN D 50 MCG (2000 UT) PO CAPS: 1.0000 | ORAL_CAPSULE | Freq: Every day | ORAL | 3 refills | Status: DC

## 2022-04-08 MED ORDER — ATENOLOL 50 MG PO TABS: 50.0000 mg | ORAL_TABLET | Freq: Two times a day (BID) | ORAL | 3 refills | Status: DC

## 2022-04-08 MED ORDER — AMLODIPINE BESYLATE 10 MG PO TABS: 10.0000 mg | ORAL_TABLET | Freq: Every day | ORAL | 3 refills | Status: DC

## 2022-04-08 MED ORDER — ROSUVASTATIN CALCIUM 20 MG PO TABS: 20.0000 mg | ORAL_TABLET | Freq: Every day | ORAL | 3 refills | Status: DC

## 2022-04-08 NOTE — Telephone Encounter (Signed)
Pt would like all meds to go to [facility] pharmacy so they can deliver directly to her.   Pt would like to know if she can use an iron pill over the counter as the one you rx was 90 dollars

## 2022-04-08 NOTE — Addendum Note (Signed)
Addended by: Minette Headland A on: 04/08/2022 03:39 PM   Modules accepted: Orders

## 2022-04-11 NOTE — Telephone Encounter (Signed)
Pt's daughter was notified

## 2022-04-18 ENCOUNTER — Other Ambulatory Visit: Payer: Self-pay | Admitting: Medical

## 2022-04-25 ENCOUNTER — Encounter: Payer: Self-pay | Admitting: *Deleted

## 2022-04-25 DIAGNOSIS — E1165 Type 2 diabetes mellitus with hyperglycemia: Secondary | ICD-10-CM | POA: Diagnosis not present

## 2022-04-25 DIAGNOSIS — E7800 Pure hypercholesterolemia, unspecified: Secondary | ICD-10-CM | POA: Diagnosis not present

## 2022-04-25 DIAGNOSIS — M792 Neuralgia and neuritis, unspecified: Secondary | ICD-10-CM | POA: Diagnosis not present

## 2022-05-06 ENCOUNTER — Ambulatory Visit (INDEPENDENT_AMBULATORY_CARE_PROVIDER_SITE_OTHER): Payer: 59 | Admitting: Podiatry

## 2022-05-06 DIAGNOSIS — I10 Essential (primary) hypertension: Secondary | ICD-10-CM | POA: Diagnosis not present

## 2022-05-06 DIAGNOSIS — E1165 Type 2 diabetes mellitus with hyperglycemia: Secondary | ICD-10-CM | POA: Diagnosis not present

## 2022-05-06 DIAGNOSIS — E7800 Pure hypercholesterolemia, unspecified: Secondary | ICD-10-CM | POA: Diagnosis not present

## 2022-05-06 DIAGNOSIS — B070 Plantar wart: Secondary | ICD-10-CM | POA: Diagnosis not present

## 2022-05-06 NOTE — Progress Notes (Signed)
Chief Complaint  Patient presents with   Diabetes    NP- Callus right 1st and 5th met - wart vs callus left arch - diabetic    Subjective: 57 y.o. female PMHx diabetes mellitus presenting to the office today to the office for evaluation of symptomatic calluses to the bilateral feet.  Patient also states that she has developed a skin lesion to the plantar aspect of the left foot that is very painful and symptomatic.  She does admit to walking around the house barefoot.  Denies history.   Past Medical History:  Diagnosis Date   Alopecia    Anemia    Anxiety    Bipolar 1 disorder ([place])    COPD (chronic obstructive pulmonary disease) (HCC)    Coronary artery disease    Depression    Diabetes mellitus 2010   Gastritis    GERD (gastroesophageal reflux disease)    Headache    migraines   Hypertension 2010   Obesity    Pneumonia    PONV (postoperative nausea and vomiting)    PTSD (post-traumatic stress disorder)    Schizophrenia ([place])    Tobacco use     Past Surgical History:  Procedure Laterality Date   ABDOMINAL HYSTERECTOMY     heavy bleeding   CARDIAC CATHETERIZATION     CHOLECYSTECTOMY     COLONOSCOPY  05/14/14   hemorrhoids, otherwise normal; Dr. Wilford Corner   COLONOSCOPY WITH PROPOFOL N/A 05/14/2014   Procedure: COLONOSCOPY WITH PROPOFOL;  Surgeon: Wilford Corner, MD;  Location: [facility] ENDOSCOPY;  Service: Endoscopy;  Laterality: N/A;   ESOPHAGOGASTRODUODENOSCOPY (EGD) WITH PROPOFOL N/A 05/14/2014   Procedure: ESOPHAGOGASTRODUODENOSCOPY (EGD) WITH PROPOFOL;  Surgeon: Wilford Corner, MD;  Location: [facility] ENDOSCOPY;  Service: Endoscopy;  Laterality: N/A;   FRACTURE SURGERY     left leg   LEG SURGERY     TUBAL LIGATION      Allergies  Allergen Reactions   Dexilant [Dexlansoprazole] Shortness Of Breath, Diarrhea, Nausea And Vomiting and Other (See Comments)    Chest pain and abdominal pain (also)   Famotidine Anaphylaxis   Meperidine Hcl Anaphylaxis   Dilaudid  [Hydromorphone Hcl] Other (See Comments)    Change in mental state   Gabapentin Other (See Comments)    Memory loss   Hydrocodone Nausea And Vomiting   Ramipril Swelling and Other (See Comments)    Angioedema    Ziprasidone Hcl Other (See Comments)    Caused convulsions   Invokana [Canagliflozin] Rash   Iohexol Itching and Rash   Tape Rash    Use paper tape only      Objective:  Physical Exam General: Alert and oriented x3 in no acute distress  Dermatology: Hyperkeratotic lesion(s) present on the bilateral feet. Pain on palpation with a central nucleated core noted to the left plantar arch. Skin is warm, dry and supple bilateral lower extremities. Negative for open lesions or macerations.  Vascular: Palpable pedal pulses bilaterally. No edema or erythema noted. Capillary refill within normal limits.  Neurological: Light touch and protective threshold grossly intact bilaterally.   Musculoskeletal Exam: Pain on palpation at the keratotic lesion(s) noted. Range of motion within normal limits bilateral. Muscle strength 5/5 in all groups bilateral.  Assessment: 1.  Symptomatic callus; benign skin lesion; plantar verruca left plantar arch 2.  Diabetes mellitus   Plan of Care:  1. Patient evaluated 2. Excisional debridement of keratoic lesion(s) using a chisel blade was performed without incident.  Salicylic acid applied to the plantar verruca  of the left foot 3. Dressed area with light dressing. 4.  Advised against going barefoot.  Recommend good supportive shoes and sneakers  5.  Continue diabetes management with PCP  6.  Patient is to return to the clinic PRN.   Edrick Kins, DPM [facility]  Dr. Edrick Kins, DPM    [address], [place] [address]                Office [phone]  Fax [phone]

## 2022-05-11 ENCOUNTER — Other Ambulatory Visit: Payer: Self-pay | Admitting: Medical

## 2022-05-11 DIAGNOSIS — Z0000 Encounter for general adult medical examination without abnormal findings: Secondary | ICD-10-CM

## 2022-05-16 ENCOUNTER — Ambulatory Visit: Payer: Medicare Other

## 2022-06-10 DIAGNOSIS — I10 Essential (primary) hypertension: Secondary | ICD-10-CM | POA: Diagnosis not present

## 2022-06-10 DIAGNOSIS — E119 Type 2 diabetes mellitus without complications: Secondary | ICD-10-CM | POA: Diagnosis not present

## 2022-06-10 DIAGNOSIS — R0789 Other chest pain: Secondary | ICD-10-CM | POA: Diagnosis not present

## 2022-06-10 DIAGNOSIS — E782 Mixed hyperlipidemia: Secondary | ICD-10-CM | POA: Diagnosis not present

## 2022-06-16 DIAGNOSIS — Z79899 Other long term (current) drug therapy: Secondary | ICD-10-CM | POA: Diagnosis not present

## 2022-06-17 DIAGNOSIS — Z79899 Other long term (current) drug therapy: Secondary | ICD-10-CM | POA: Diagnosis not present

## 2022-06-20 DIAGNOSIS — Z79899 Other long term (current) drug therapy: Secondary | ICD-10-CM | POA: Diagnosis not present

## 2022-06-24 ENCOUNTER — Ambulatory Visit
Admission: RE | Admit: 2022-06-24 | Discharge: 2022-06-24 | Disposition: A | Discharge status: Home / Self Care | Payer: 59 | Source: Ambulatory Visit | Attending: Medical | Admitting: Medical

## 2022-06-24 DIAGNOSIS — Z1231 Encounter for screening mammogram for malignant neoplasm of breast: Secondary | ICD-10-CM | POA: Diagnosis not present

## 2022-06-24 DIAGNOSIS — Z0000 Encounter for general adult medical examination without abnormal findings: Secondary | ICD-10-CM

## 2022-06-28 NOTE — Progress Notes (Signed)
Mammogram shows area of concern and other imaging recommended.  She should be getting a call back.  If not heard back within 1 week, call us back.

## 2022-06-29 ENCOUNTER — Other Ambulatory Visit: Payer: Self-pay | Admitting: Medical

## 2022-06-29 DIAGNOSIS — R928 Other abnormal and inconclusive findings on diagnostic imaging of breast: Secondary | ICD-10-CM

## 2022-07-15 ENCOUNTER — Ambulatory Visit
Admission: RE | Admit: 2022-07-15 | Discharge: 2022-07-15 | Disposition: A | Discharge status: Home / Self Care | Payer: 59 | Source: Ambulatory Visit | Attending: Medical | Admitting: Medical

## 2022-07-15 ENCOUNTER — Other Ambulatory Visit: Payer: Self-pay | Admitting: Medical

## 2022-07-15 DIAGNOSIS — R921 Mammographic calcification found on diagnostic imaging of breast: Secondary | ICD-10-CM | POA: Diagnosis not present

## 2022-07-15 DIAGNOSIS — Z803 Family history of malignant neoplasm of breast: Secondary | ICD-10-CM | POA: Diagnosis not present

## 2022-07-15 DIAGNOSIS — R928 Other abnormal and inconclusive findings on diagnostic imaging of breast: Secondary | ICD-10-CM

## 2022-07-16 NOTE — Progress Notes (Signed)
Mammogram shows area of concern and other imaging recommended.  She should be getting a call back.  If not heard back within 1 week, call us back.

## 2022-07-21 ENCOUNTER — Ambulatory Visit
Admission: RE | Admit: 2022-07-21 | Discharge: 2022-07-21 | Disposition: A | Discharge status: Home / Self Care | Payer: 59 | Source: Ambulatory Visit | Attending: Medical | Admitting: Medical

## 2022-07-21 DIAGNOSIS — R921 Mammographic calcification found on diagnostic imaging of breast: Secondary | ICD-10-CM

## 2022-07-21 DIAGNOSIS — N6489 Other specified disorders of breast: Secondary | ICD-10-CM | POA: Diagnosis not present

## 2022-07-21 DIAGNOSIS — Z803 Family history of malignant neoplasm of breast: Secondary | ICD-10-CM | POA: Diagnosis not present

## 2022-07-21 HISTORY — PX: BREAST BIOPSY: SHX20

## 2022-07-22 ENCOUNTER — Telehealth: Payer: Self-pay | Admitting: Medical

## 2022-07-22 NOTE — Telephone Encounter (Signed)
Pt was notified of resutls 

## 2022-07-22 NOTE — Telephone Encounter (Signed)
Cheyenne Alvarez called and states she had her biopsy done and she sees the results on My Chart and she wanted them to be explained to her. I informed that once Vincenza Hews looks over them someone would give her a call. She would like to be called at [phone].

## 2022-07-28 DIAGNOSIS — E119 Type 2 diabetes mellitus without complications: Secondary | ICD-10-CM | POA: Diagnosis not present

## 2022-07-28 DIAGNOSIS — H2513 Age-related nuclear cataract, bilateral: Secondary | ICD-10-CM | POA: Diagnosis not present

## 2022-07-28 DIAGNOSIS — H04123 Dry eye syndrome of bilateral lacrimal glands: Secondary | ICD-10-CM | POA: Diagnosis not present

## 2022-07-28 LAB — HM DIABETES EYE EXAM

## 2022-07-29 ENCOUNTER — Encounter: Payer: Self-pay | Admitting: Internal Medicine

## 2022-09-09 DIAGNOSIS — E782 Mixed hyperlipidemia: Secondary | ICD-10-CM | POA: Diagnosis not present

## 2022-09-09 DIAGNOSIS — I10 Essential (primary) hypertension: Secondary | ICD-10-CM | POA: Diagnosis not present

## 2022-09-09 DIAGNOSIS — E559 Vitamin D deficiency, unspecified: Secondary | ICD-10-CM | POA: Diagnosis not present

## 2022-09-09 DIAGNOSIS — E785 Hyperlipidemia, unspecified: Secondary | ICD-10-CM | POA: Diagnosis not present

## 2022-09-09 DIAGNOSIS — E119 Type 2 diabetes mellitus without complications: Secondary | ICD-10-CM | POA: Diagnosis not present

## 2022-09-09 LAB — HEMOGLOBIN A1C
EGFR: 58
Hemoglobin A1C: 6.9

## 2022-09-15 ENCOUNTER — Other Ambulatory Visit: Payer: Self-pay | Admitting: Medical

## 2022-09-26 ENCOUNTER — Ambulatory Visit (INDEPENDENT_AMBULATORY_CARE_PROVIDER_SITE_OTHER): Payer: 59 | Admitting: Medical

## 2022-09-26 VITALS — BP 120/78 | HR 82 | Wt 189.8 lb

## 2022-09-26 DIAGNOSIS — D509 Iron deficiency anemia, unspecified: Secondary | ICD-10-CM | POA: Diagnosis not present

## 2022-09-26 DIAGNOSIS — I10 Essential (primary) hypertension: Secondary | ICD-10-CM

## 2022-09-26 DIAGNOSIS — Z79899 Other long term (current) drug therapy: Secondary | ICD-10-CM

## 2022-09-26 DIAGNOSIS — E1165 Type 2 diabetes mellitus with hyperglycemia: Secondary | ICD-10-CM

## 2022-09-26 DIAGNOSIS — G5793 Unspecified mononeuropathy of bilateral lower limbs: Secondary | ICD-10-CM

## 2022-09-26 DIAGNOSIS — E1169 Type 2 diabetes mellitus with other specified complication: Secondary | ICD-10-CM | POA: Diagnosis not present

## 2022-09-26 DIAGNOSIS — Z7185 Encounter for immunization safety counseling: Secondary | ICD-10-CM

## 2022-09-26 DIAGNOSIS — J449 Chronic obstructive pulmonary disease, unspecified: Secondary | ICD-10-CM

## 2022-09-26 DIAGNOSIS — M79605 Pain in left leg: Secondary | ICD-10-CM

## 2022-09-26 DIAGNOSIS — E785 Hyperlipidemia, unspecified: Secondary | ICD-10-CM | POA: Diagnosis not present

## 2022-09-26 DIAGNOSIS — M79604 Pain in right leg: Secondary | ICD-10-CM

## 2022-09-26 DIAGNOSIS — F319 Bipolar disorder, unspecified: Secondary | ICD-10-CM

## 2022-09-26 DIAGNOSIS — Z794 Long term (current) use of insulin: Secondary | ICD-10-CM

## 2022-09-26 DIAGNOSIS — F411 Generalized anxiety disorder: Secondary | ICD-10-CM

## 2022-09-26 LAB — CBC WITH DIFFERENTIAL/PLATELET
Basophils Absolute: 0 10*3/uL (ref 0.0–0.2)
Basos: 0 %
EOS (ABSOLUTE): 0.3 10*3/uL (ref 0.0–0.4)
Eos: 3 %
Hematocrit: 34.4 % (ref 34.0–46.6)
Hemoglobin: 11.7 g/dL (ref 11.1–15.9)
Immature Grans (Abs): 0 10*3/uL (ref 0.0–0.1)
Immature Granulocytes: 0 %
Lymphocytes Absolute: 3.8 10*3/uL — ABNORMAL HIGH (ref 0.7–3.1)
Lymphs: 40 %
MCH: 27.9 pg (ref 26.6–33.0)
MCHC: 34 g/dL (ref 31.5–35.7)
MCV: 82 fL (ref 79–97)
Monocytes Absolute: 0.7 10*3/uL (ref 0.1–0.9)
Monocytes: 7 %
Neutrophils Absolute: 4.7 10*3/uL (ref 1.4–7.0)
Neutrophils: 50 %
Platelets: 337 10*3/uL (ref 150–450)
RBC: 4.19 x10E6/uL (ref 3.77–5.28)
RDW: 13.4 % (ref 11.7–15.4)
WBC: 9.5 10*3/uL (ref 3.4–10.8)

## 2022-09-26 MED ORDER — AMITRIPTYLINE HCL 10 MG PO TABS: 10.0000 mg | ORAL_TABLET | Freq: Every day | ORAL | 1 refills | Status: DC

## 2022-09-26 NOTE — Progress Notes (Addendum)
Subjective:  Cheyenne Alvarez is a 57 y.o. female who presents for Chief Complaint  Patient presents with   Medical Management of Chronic Issues    6 month follow-up, had blood work done 2 weeks ago at Dr. Sharyn Lull office, trying to get records faxed     Here for chronic disease follow-up.  Current Health Care Team: Dr. Dorisann Frames, endocrinology Dr. Raelyn Mora, CNM with gynecology Dr. Sharyn Lull, cardiology Dr. Omelia Blackwater, psychiatry Dr. Charlott Rakes, GI Dr. Gala Lewandowsky, podiatry Dentist No eye doctor  Concerns: Here for chronic issues.  Here alone today, daughter in the lobby.  She notes lately legs and feet giving her a lot of trouble. Sees podiatry, but they asked her to ask [place] about neuropathy.   Gets up in middle of night in pain.  In recent months had breast biopsy after abnormal imaging.    Asthma - using advair daily, no recent issues  History of anemia - no recent blood in stool.  Just had labs from cardiology.  Diabetes - sees endocrine.  Currently on Trulicity, glimepiride,  Compliant with medication for BP, cholesterol.    No other aggravating or relieving factors.    No other c/o.  Past Medical History:  Diagnosis Date   Alopecia    Anemia    Anxiety    Bipolar 1 disorder (HCC)    COPD (chronic obstructive pulmonary disease) (HCC)    Coronary artery disease    Depression    Diabetes mellitus 2010   Gastritis    GERD (gastroesophageal reflux disease)    Headache    migraines   Hypertension 2010   Obesity    Pneumonia    PONV (postoperative nausea and vomiting)    PTSD (post-traumatic stress disorder)    Schizophrenia (HCC)    Tobacco use    Current Outpatient Medications on File Prior to Visit  Medication Sig Dispense Refill   albuterol (VENTOLIN HFA) 108 (90 Base) MCG/ACT inhaler INHALE 2 PUFFS INTO THE LUNGS EVERY 6 HOURS AS NEEDED FOR WHEEZING OR SHORTNESS OF BREATH 54 g 0   amLODipine (NORVASC) 10 MG tablet Take 1 tablet (10 mg  total) by mouth daily. TAKE 1 TABLET(10 MG) BY MOUTH DAILY 90 tablet 3   atenolol (TENORMIN) 50 MG tablet Take 1 tablet (50 mg total) by mouth 2 (two) times daily. 180 tablet 3   Cholecalciferol (VITAMIN D) 50 MCG (2000 UT) CAPS Take 1 capsule (2,000 Units total) by mouth daily. 90 capsule 3   ferrous sulfate 325 (65 FE) MG EC tablet Take 1 tablet (325 mg total) by mouth daily. 90 tablet 1   fluticasone-salmeterol (ADVAIR) 250-50 MCG/ACT AEPB Inhale 1 puff twice daily, morning & at bedtime 60 each 11   fluvoxaMINE (LUVOX) 25 MG tablet Take 25 mg by mouth 2 (two) times daily.     glimepiride (AMARYL) 2 MG tablet      INGREZZA 80 MG capsule Take 80 mg by mouth daily.     nitroGLYCERIN (NITROSTAT) 0.4 MG SL tablet Place 0.4 mg under the tongue every 5 (five) minutes as needed for chest pain. Reported on 07/15/2015     pantoprazole (PROTONIX) 40 MG tablet Take 1 capsule by mouth twice daily 60 tablet 2   rosuvastatin (CRESTOR) 20 MG tablet Take 1 tablet (20 mg total) by mouth daily. 90 tablet 3   Suvorexant (BELSOMRA) 20 MG TABS Take by mouth.     traZODone (DESYREL) 50 MG tablet Take 50 mg by mouth at  bedtime.     TRULICITY 3 MG/0.5ML SOPN Inject into the skin.     No current facility-administered medications on file prior to visit.     The following portions of the patient's history were reviewed and updated as appropriate: allergies, current medications, past family history, past medical history, past social history, past surgical history and problem list.  ROS Otherwise as in subjective above    Objective: BP 120/78   Pulse 82   Wt 189 lb 12.8 oz (86.1 kg)   BMI 32.58 kg/m   Wt Readings from Last 3 Encounters:  09/26/22 189 lb 12.8 oz (86.1 kg)  03/22/22 176 lb 3.2 oz (79.9 kg)  11/26/21 167 lb (75.8 kg)   BP Readings from Last 3 Encounters:  09/26/22 120/78  03/22/22 124/70  10/21/21 104/73    General appearance: alert, no distress, well developed, well nourished Neck:  supple, no lymphadenopathy, no thyromegaly, no masses, no JVD Heart: RRR, normal S1, S2, no murmurs Lungs: CTA bilaterally, no wheezes, rhonchi, or rales Pulses: 2+ radial pulses, 2+ pedal pulses, normal cap refill Ext: no edema  Diabetic Foot Exam - Simple   Simple Foot Form Diabetic Foot exam was performed with the following findings: Yes 09/26/2022 11:12 AM  Visual Inspection See comments: Yes Sensation Testing See comments: Yes Pulse Check Posterior Tibialis and Dorsalis pulse intact bilaterally: Yes Comments Bunion bilat, decreased monofilament sensation subtly of both feet     Assessment: Encounter Diagnoses  Name Primary?   Pain in both lower extremities Yes   Iron deficiency anemia, unspecified iron deficiency anemia type    Bipolar affective disorder, remission status unspecified (HCC)    Chronic obstructive pulmonary disease, unspecified COPD type (HCC)    Essential hypertension    Generalized anxiety disorder    High risk medication use    Hyperlipidemia associated with type 2 diabetes mellitus (HCC)    Type 2 diabetes mellitus with hyperglycemia, with long-term current use of insulin (HCC)    Vaccine counseling    Neuropathy involving both lower extremities      Plan: Bilateral leg pain and neuropathy of feet Does not tolerate gabapentin Discussed other possible options.  She wants to avoid any weight gain so likely will not pursue Lyrica Continue walking regularly for exercise Begin trial of Amitriptyline,  failed gabapentin, doesn't want to try Lyrica given potential weight gain  I reviewed labs that she had done on September 09, 2022 through cardiology.  At that time lipid panel showed LDL 88, total cholesterol 563, triglycerides 85, hemoglobin A1c 6.9%, GFR 58, creatinine 1.11, liver panel normal except for slightly low bilirubin indirect, vitamin D was 50  We will update a CBC today given history of anemia.  Not due for follow-up with gastro until 2026 however  if hemoglobin has dropped, consider sooner eval  Hypertension-continue current medications, amlodipine 10 mg daily, atenolol 50 mg daily, sees cardiology  Hyperlipidemia-continue rosuvastatin 20 mg daily, sees cardiology  History of bipolar disorder, anxiety-managed by psychiatry  Vaccine counseling: I recommend an updated shingles and COVID-vaccine at your pharmacy     Jaynie was seen today for medical management of chronic issues.  Diagnoses and all orders for this visit:  Pain in both lower extremities  Iron deficiency anemia, unspecified iron deficiency anemia type -     CBC with Differential/Platelet  Bipolar affective disorder, remission status unspecified (HCC)  Chronic obstructive pulmonary disease, unspecified COPD type (HCC)  Essential hypertension  Generalized anxiety disorder  High risk  medication use  Hyperlipidemia associated with type 2 diabetes mellitus (HCC)  Type 2 diabetes mellitus with hyperglycemia, with long-term current use of insulin (HCC)  Vaccine counseling  Neuropathy involving both lower extremities  Other orders -     Cancel: DG Bone Density; Future    Follow up: pending lab

## 2022-09-26 NOTE — Progress Notes (Signed)
Pt was notified.  

## 2022-09-27 ENCOUNTER — Encounter: Payer: Self-pay | Admitting: Internal Medicine

## 2022-09-27 ENCOUNTER — Other Ambulatory Visit: Payer: Self-pay | Admitting: Medical

## 2022-09-27 MED ORDER — ALBUTEROL SULFATE HFA 108 (90 BASE) MCG/ACT IN AERS: 2.0000 | INHALATION_SPRAY | Freq: Four times a day (QID) | RESPIRATORY_TRACT | 0 refills | Status: DC | PRN

## 2022-09-27 MED ORDER — FERROUS SULFATE 325 (65 FE) MG PO TBEC: 325.0000 mg | DELAYED_RELEASE_TABLET | Freq: Every day | ORAL | 1 refills | Status: DC

## 2022-09-27 NOTE — Progress Notes (Signed)
Results sent through MyChart

## 2022-10-10 ENCOUNTER — Ambulatory Visit: Payer: 59 | Admitting: Podiatry

## 2022-10-21 ENCOUNTER — Ambulatory Visit: Payer: 59 | Admitting: Podiatry

## 2022-11-04 ENCOUNTER — Ambulatory Visit (INDEPENDENT_AMBULATORY_CARE_PROVIDER_SITE_OTHER): Payer: 59 | Admitting: Medical

## 2022-11-04 VITALS — BP 128/80 | HR 81 | Wt 197.2 lb

## 2022-11-04 DIAGNOSIS — E1165 Type 2 diabetes mellitus with hyperglycemia: Secondary | ICD-10-CM

## 2022-11-04 DIAGNOSIS — G5793 Unspecified mononeuropathy of bilateral lower limbs: Secondary | ICD-10-CM

## 2022-11-04 DIAGNOSIS — F250 Schizoaffective disorder, bipolar type: Secondary | ICD-10-CM

## 2022-11-04 DIAGNOSIS — Z79899 Other long term (current) drug therapy: Secondary | ICD-10-CM | POA: Diagnosis not present

## 2022-11-04 DIAGNOSIS — E1169 Type 2 diabetes mellitus with other specified complication: Secondary | ICD-10-CM

## 2022-11-04 DIAGNOSIS — R252 Cramp and spasm: Secondary | ICD-10-CM | POA: Diagnosis not present

## 2022-11-04 DIAGNOSIS — Z23 Encounter for immunization: Secondary | ICD-10-CM

## 2022-11-04 DIAGNOSIS — E785 Hyperlipidemia, unspecified: Secondary | ICD-10-CM

## 2022-11-04 MED ORDER — AMITRIPTYLINE HCL 50 MG PO TABS: ORAL_TABLET | ORAL | 1 refills | Status: DC

## 2022-11-04 NOTE — Progress Notes (Signed)
Subjective:  Cheyenne Alvarez is a 57 y.o. female who presents for Chief Complaint  Patient presents with   1 month follow-up    1 month follow-up on pain in legs and feet, feet are burning and itching. Has upcoming appt with foot doctor     Here for chronic disease follow-up.  Current Health Care Team: Dr. Dorisann Frames, endocrinology Dr. Raelyn Mora, CNM with gynecology Dr. Sharyn Lull, cardiology Dr. Omelia Blackwater, psychiatry Dr. Charlott Rakes, GI Dr. Gala Lewandowsky, podiatry   Concerns: Here for recheck on pain.  I saw her a month ago for ongoing issues with legs, pain suggestive of neuropathy.  Last visit we started a trial of amitriptyline but she does not like it is helping.  In the past she has not tolerated gabapentin and she want to stay away from Lyrica for potential for weight gain.  She notes lately legs and feet giving her a lot of trouble. Sees podiatry, but they asked her to ask [place] about neuropathy.   Gets up in middle of night in pain.  Gets burning sensations of her feet  She has cramps in her legs and her foot drawls up at times.  She has been doing some mustard which helped a little bit.  No other aggravating or relieving factors.    No other c/o.  Past Medical History:  Diagnosis Date   Alopecia    Anemia    Anxiety    Bipolar 1 disorder (HCC)    COPD (chronic obstructive pulmonary disease) (HCC)    Coronary artery disease    Depression    Diabetes mellitus 2010   Gastritis    GERD (gastroesophageal reflux disease)    Headache    migraines   Hypertension 2010   Obesity    Pneumonia    PONV (postoperative nausea and vomiting)    PTSD (post-traumatic stress disorder)    Schizophrenia (HCC)    Tobacco use    Current Outpatient Medications on File Prior to Visit  Medication Sig Dispense Refill   albuterol (VENTOLIN HFA) 108 (90 Base) MCG/ACT inhaler Inhale 2 puffs into the lungs every 6 (six) hours as needed for wheezing or shortness of breath. 54 g  0   amitriptyline (ELAVIL) 10 MG tablet Take 1 tablet (10 mg total) by mouth at bedtime. 30 tablet 1   amLODipine (NORVASC) 10 MG tablet Take 1 tablet (10 mg total) by mouth daily. TAKE 1 TABLET(10 MG) BY MOUTH DAILY 90 tablet 3   atenolol (TENORMIN) 50 MG tablet Take 1 tablet (50 mg total) by mouth 2 (two) times daily. 180 tablet 3   Cholecalciferol (VITAMIN D) 50 MCG (2000 UT) CAPS Take 1 capsule (2,000 Units total) by mouth daily. 90 capsule 3   ferrous sulfate 325 (65 FE) MG EC tablet Take 1 tablet (325 mg total) by mouth daily. 90 tablet 1   fluticasone-salmeterol (ADVAIR) 250-50 MCG/ACT AEPB Inhale 1 puff twice daily, morning & at bedtime 60 each 11   fluvoxaMINE (LUVOX) 25 MG tablet Take 25 mg by mouth 2 (two) times daily.     glimepiride (AMARYL) 2 MG tablet      INGREZZA 80 MG capsule Take 80 mg by mouth daily.     nitroGLYCERIN (NITROSTAT) 0.4 MG SL tablet Place 0.4 mg under the tongue every 5 (five) minutes as needed for chest pain. Reported on 07/15/2015     pantoprazole (PROTONIX) 40 MG tablet Take 1 capsule by mouth twice daily 60 tablet 2  rosuvastatin (CRESTOR) 20 MG tablet Take 1 tablet (20 mg total) by mouth daily. 90 tablet 3   Suvorexant (BELSOMRA) 20 MG TABS Take by mouth.     traZODone (DESYREL) 50 MG tablet Take 50 mg by mouth at bedtime.     TRULICITY 3 MG/0.5ML SOPN Inject into the skin.     No current facility-administered medications on file prior to visit.     The following portions of the patient's history were reviewed and updated as appropriate: allergies, current medications, past family history, past medical history, past social history, past surgical history and problem list.  ROS Otherwise as in subjective above    Objective: BP 128/80   Pulse 81   Wt 197 lb 3.2 oz (89.4 kg)   BMI 33.85 kg/m   Wt Readings from Last 3 Encounters:  11/04/22 197 lb 3.2 oz (89.4 kg)  09/26/22 189 lb 12.8 oz (86.1 kg)  03/22/22 176 lb 3.2 oz (79.9 kg)   BP  Readings from Last 3 Encounters:  11/04/22 128/80  09/26/22 120/78  03/22/22 124/70    General appearance: alert, no distress, well developed, well nourished Bunions bilaterally, decreased possible sensation of both feet, pulses normal, no edema, legs otherwise nontender with normal range of motion     Assessment: Encounter Diagnoses  Name Primary?   Neuropathy involving both lower extremities Yes   Uncontrolled type 2 diabetes mellitus with hyperglycemia (HCC)    Needs flu shot    Cramping of feet    Schizoaffective disorder, bipolar type (HCC)    Hyperlipidemia associated with type 2 diabetes mellitus (HCC)    High risk medication use       Plan: Bilateral leg pain and neuropathy of feet Prior has not tolerated gabapentin.  She declines Lyrica.   Increase dose of amitriptyline to 25 mg or 1/2 tablet of the 50 mg daily.  After 3 weeks if not improving can go to 50 mg.  I advised I do not feel comfortable going over 50 mg of this medication. we will go ahead and place referral to physical medication/pain medicine as well. Continue walking regularly for exercise  Muscle cramping-additional labs as below today.  Continue current therapies for diabetes, lipids, mental health medications.  Counseled on the influenza virus vaccine.  Vaccine information sheet given.  Influenza vaccine given after consent obtained.   Cheyenne Alvarez was seen today for 1 month follow-up.  Diagnoses and all orders for this visit:  Neuropathy involving both lower extremities -     Comprehensive metabolic panel -     Vitamin B12 -     CK -     Ambulatory referral to Physical Medicine Rehab  Uncontrolled type 2 diabetes mellitus with hyperglycemia (HCC)  Needs flu shot -     Flu vaccine trivalent PF, 6mos and older(Flulaval,Afluria,Fluarix,Fluzone)  Cramping of feet -     Comprehensive metabolic panel -     Vitamin B12 -     CK -     Magnesium -     Ambulatory referral to Physical Medicine  Rehab  Schizoaffective disorder, bipolar type (HCC)  Hyperlipidemia associated with type 2 diabetes mellitus (HCC)  High risk medication use -     Comprehensive metabolic panel -     Vitamin B12 -     CK  Other orders -     amitriptyline (ELAVIL) 50 MG tablet; 1/2 tablet daily for the next 3 weeks.  Can increase to 1 whole tablet after 3 weeks  Follow up: pending labs

## 2022-11-06 LAB — COMPREHENSIVE METABOLIC PANEL
ALT: 13 IU/L (ref 0–32)
AST: 13 IU/L (ref 0–40)
Albumin: 3.8 g/dL (ref 3.8–4.9)
Alkaline Phosphatase: 106 IU/L (ref 44–121)
BUN/Creatinine Ratio: 10 (ref 9–23)
BUN: 11 mg/dL (ref 6–24)
Bilirubin Total: <0.2 mg/dL (ref 0.0–1.2)
CO2: 22 mmol/L (ref 20–29)
Calcium: 9.3 mg/dL (ref 8.7–10.2)
Chloride: 104 mmol/L (ref 96–106)
Creatinine, Ser: 1.1 mg/dL — ABNORMAL HIGH (ref 0.57–1.00)
Globulin, Total: 2.7 g/dL (ref 1.5–4.5)
Glucose: 148 mg/dL — ABNORMAL HIGH (ref 70–99)
Potassium: 4.1 mmol/L (ref 3.5–5.2)
Sodium: 139 mmol/L (ref 134–144)
Total Protein: 6.5 g/dL (ref 6.0–8.5)
eGFR: 58 mL/min/{1.73_m2} — ABNORMAL LOW (ref >59)

## 2022-11-06 LAB — MAGNESIUM: Magnesium: 1.7 mg/dL (ref 1.6–2.3)

## 2022-11-06 LAB — CK: Total CK: 189 U/L — ABNORMAL HIGH (ref 32–182)

## 2022-11-06 LAB — VITAMIN B12: Vitamin B-12: 473 pg/mL (ref 232–1245)

## 2022-11-07 NOTE — Progress Notes (Signed)
Results sent through MyChart

## 2022-11-09 ENCOUNTER — Other Ambulatory Visit (HOSPITAL_COMMUNITY): Payer: Self-pay

## 2022-11-09 DIAGNOSIS — I10 Essential (primary) hypertension: Secondary | ICD-10-CM | POA: Diagnosis not present

## 2022-11-09 DIAGNOSIS — E1165 Type 2 diabetes mellitus with hyperglycemia: Secondary | ICD-10-CM | POA: Diagnosis not present

## 2022-11-09 DIAGNOSIS — E7800 Pure hypercholesterolemia, unspecified: Secondary | ICD-10-CM | POA: Diagnosis not present

## 2022-11-09 MED ORDER — MOUNJARO 7.5 MG/0.5ML ~~LOC~~ SOAJ
7.5000 mg | SUBCUTANEOUS | 3 refills | Status: DC
  Filled 2022-11-09 – 2022-11-10 (×2): qty 6, 84d supply, fill #0
  Filled 2022-11-15: qty 2, 28d supply, fill #0

## 2022-11-10 ENCOUNTER — Other Ambulatory Visit (HOSPITAL_COMMUNITY): Payer: Self-pay

## 2022-11-14 ENCOUNTER — Other Ambulatory Visit (HOSPITAL_COMMUNITY): Payer: Self-pay

## 2022-11-14 ENCOUNTER — Encounter: Payer: Self-pay | Admitting: Physical Medicine & Rehabilitation

## 2022-11-15 ENCOUNTER — Other Ambulatory Visit (HOSPITAL_COMMUNITY): Payer: Self-pay

## 2022-11-15 ENCOUNTER — Other Ambulatory Visit: Payer: Self-pay

## 2022-11-21 ENCOUNTER — Other Ambulatory Visit: Payer: Self-pay | Admitting: Medical

## 2022-11-22 ENCOUNTER — Ambulatory Visit: Payer: 59

## 2022-11-22 DIAGNOSIS — Z0000 Encounter for general adult medical examination without abnormal findings: Secondary | ICD-10-CM

## 2022-11-22 NOTE — Patient Instructions (Signed)
[name] , Thank you for taking time to come for your Medicare Wellness Visit. I appreciate your ongoing commitment to your health goals. Please review the following plan we discussed and let me know if I can assist you in the future.   Referrals/Orders/Follow-Ups/Clinician Recommendations: none  This is a list of the screening recommended for you and due dates:  Health Maintenance  Topic Date Due   Pap with HPV screening  08/03/2012   COVID-19 Vaccine (3 - Moderna risk series) 04/03/2020   Zoster (Shingles) Vaccine (1 of 2) 02/03/2023*   Hemoglobin A1C  03/12/2023   Yearly kidney health urinalysis for diabetes  03/23/2023   Eye exam for diabetics  07/28/2023   Complete foot exam   09/26/2023   Yearly kidney function blood test for diabetes  11/04/2023   Medicare Annual Wellness Visit  11/22/2023   Colon Cancer Screening  05/13/2024   Mammogram  07/14/2024   Hepatitis C Screening  Completed   HIV Screening  Completed   HPV Vaccine  Aged Out   DTaP/Tdap/Td vaccine  Discontinued   Flu Shot  Discontinued  *Topic was postponed. The date shown is not the original due date.    Advanced directives: (In Chart) A copy of your advanced directives are scanned into your chart should your provider ever need it.  Next Medicare Annual Wellness Visit scheduled for next year: Yes  insert Preventive Care Attachment Reference

## 2022-11-22 NOTE — Progress Notes (Signed)
Subjective:   Cheyenne Alvarez is a 57 y.o. female who presents for Medicare Annual (Subsequent) preventive examination.  Visit Complete: Virtual  I connected with  Leward Quan on 11/22/22 by a audio enabled telemedicine application and verified that I am speaking with the correct person using two identifiers. Daughter [name] was also on the call.  Patient Location: Home  Provider Location: Office/Clinic  I discussed the limitations of evaluation and management by telemedicine. The patient expressed understanding and agreed to proceed.  Vital Signs: Unable to obtain new vitals due to this being a telehealth visit.  Cardiac Risk Factors include: advanced age (>75men, >42 women);diabetes mellitus;dyslipidemia;hypertension     Objective:    Today's Vitals   There is no height or weight on file to calculate BMI.     11/22/2022    2:16 PM 11/26/2021   10:25 AM 10/14/2019   11:11 AM 05/26/2019   12:12 AM 12/06/2016    3:53 PM 04/07/2015   12:28 PM 01/20/2015    1:40 PM  Advanced Directives  Does Patient Have a Medical Advance Directive? Yes Yes No No No No No  Type of Estate agent of [place];Living will Healthcare Power of [place];Living will       Copy of Healthcare Power of Attorney in Chart? Yes - validated most recent copy scanned in chart (See row information) Yes - validated most recent copy scanned in chart (See row information)       Would patient like information on creating a medical advance directive?    No - Patient declined No - Patient declined      Current Medications (verified) Outpatient Encounter Medications as of 11/22/2022  Medication Sig   albuterol (VENTOLIN HFA) 108 (90 Base) MCG/ACT inhaler Inhale 2 puffs into the lungs every 6 (six) hours as needed for wheezing or shortness of breath.   amitriptyline (ELAVIL) 10 MG tablet Take 1 tablet (10 mg total) by mouth at bedtime.   amitriptyline (ELAVIL) 50 MG tablet 1/2 tablet  daily for the next 3 weeks.  Can increase to 1 whole tablet after 3 weeks   amLODipine (NORVASC) 10 MG tablet Take 1 tablet (10 mg total) by mouth daily. TAKE 1 TABLET(10 MG) BY MOUTH DAILY   atenolol (TENORMIN) 50 MG tablet Take 1 tablet (50 mg total) by mouth 2 (two) times daily.   Cholecalciferol (VITAMIN D) 50 MCG (2000 UT) CAPS Take 1 capsule (2,000 Units total) by mouth daily.   ferrous sulfate 325 (65 FE) MG EC tablet Take 1 tablet (325 mg total) by mouth daily.   fluticasone-salmeterol (ADVAIR) 250-50 MCG/ACT AEPB Inhale 1 puff twice daily, morning & at bedtime   fluvoxaMINE (LUVOX) 25 MG tablet Take 25 mg by mouth 2 (two) times daily.   glimepiride (AMARYL) 2 MG tablet    INGREZZA 80 MG capsule Take 80 mg by mouth daily.   nitroGLYCERIN (NITROSTAT) 0.4 MG SL tablet Place 0.4 mg under the tongue every 5 (five) minutes as needed for chest pain. Reported on 07/15/2015   pantoprazole (PROTONIX) 40 MG tablet Take 1 capsule by mouth twice daily   rosuvastatin (CRESTOR) 20 MG tablet Take 1 tablet (20 mg total) by mouth daily.   Suvorexant (BELSOMRA) 20 MG TABS Take by mouth.   tirzepatide (MOUNJARO) 7.5 MG/0.5ML Pen Inject 7.5 mg into the skin once a week as directed   traZODone (DESYREL) 50 MG tablet Take 50 mg by mouth at bedtime.   TRULICITY 3 MG/0.5ML SOPN  Inject into the skin.   No facility-administered encounter medications on file as of 11/22/2022.    Allergies (verified) Dexilant [dexlansoprazole], Famotidine, Meperidine hcl, Dilaudid [hydromorphone hcl], Gabapentin, Hydrocodone, Ramipril, Ziprasidone hcl, Invokana [canagliflozin], Iohexol, and Tape   History: Past Medical History:  Diagnosis Date   Alopecia    Anemia    Anxiety    Bipolar 1 disorder (HCC)    COPD (chronic obstructive pulmonary disease) (HCC)    Coronary artery disease    Depression    Diabetes mellitus 2010   Gastritis    GERD (gastroesophageal reflux disease)    Headache    migraines   Hypertension  2010   Obesity    Pneumonia    PONV (postoperative nausea and vomiting)    PTSD (post-traumatic stress disorder)    Schizophrenia (HCC)    Tobacco use    Past Surgical History:  Procedure Laterality Date   ABDOMINAL HYSTERECTOMY     heavy bleeding   BREAST BIOPSY Right 07/21/2022   MM RT BREAST BX W LOC DEV 1ST LESION IMAGE BX SPEC STEREO GUIDE 07/21/2022 [facility]   CARDIAC CATHETERIZATION     CHOLECYSTECTOMY     COLONOSCOPY  05/14/14   hemorrhoids, otherwise normal; Dr. Charlott Rakes   COLONOSCOPY WITH PROPOFOL N/A 05/14/2014   Procedure: COLONOSCOPY WITH PROPOFOL;  Surgeon: Charlott Rakes, MD;  Location: [facility]COPY;  Service: Endoscopy;  Laterality: N/A;   ESOPHAGOGASTRODUODENOSCOPY (EGD) WITH PROPOFOL N/A 05/14/2014   Procedure: ESOPHAGOGASTRODUODENOSCOPY (EGD) WITH PROPOFOL;  Surgeon: Charlott Rakes, MD;  Location: [facility] ENDOSCOPY;  Service: Endoscopy;  Laterality: N/A;   FRACTURE SURGERY     left leg   LEG SURGERY     TUBAL LIGATION     Family History  Problem Relation Age of Onset   Breast cancer Maternal Grandmother    Seizures Father    Diabetes Mother    Kidney failure Mother    Cancer Brother    Mental illness Brother    Breast cancer Sister 69   Diabetes Sister    Drug abuse Sister    Hypertension Sister    Diabetes Other    Cancer Other    Hypertension Other    Social History   Socioeconomic History   Marital status: Divorced    Spouse name: Not on file   Number of children: Not on file   Years of education: Not on file   Highest education level: Not on file  Occupational History   Not on file  Tobacco Use   Smoking status: Former    Current packs/day: 0.00    Types: Cigarettes    Quit date: 12/18/2013    Years since quitting: 8.9   Smokeless tobacco: Never  Vaping Use   Vaping status: Never Used  Substance and Sexual Activity   Alcohol use: No    Alcohol/week: 0.0 standard drinks of alcohol   Drug use: No   Sexual activity: Not  Currently    Partners: Male    Birth control/protection: Surgical  Other Topics Concern   Not on file  Social History Narrative   Not on file   Social Determinants of Health   Financial Resource Strain: Low Risk  (11/22/2022)   Overall Financial Resource Strain (CARDIA)    Difficulty of Paying Living Expenses: Not hard at all  Food Insecurity: No Food Insecurity (11/22/2022)   Hunger Vital Sign    Worried About Running Out of Food in the Last Year: Never true    Ran Out  of Food in the Last Year: Never true  Transportation Needs: No Transportation Needs (11/22/2022)   PRAPARE - Administrator, Civil Service (Medical): No    Lack of Transportation (Non-Medical): No  Physical Activity: Insufficiently Active (11/22/2022)   Exercise Vital Sign    Days of Exercise per Week: 3 days    Minutes of Exercise per Session: 20 min  Stress: Stress Concern Present (11/22/2022)   [employer] of Occupational Health - Occupational Stress Questionnaire    Feeling of Stress : Rather much  Social Connections: Moderately Isolated (11/22/2022)   Social Connection and Isolation Panel [NHANES]    Frequency of Communication with Friends and Family: More than three times a week    Frequency of Social Gatherings with Friends and Family: More than three times a week    Attends Religious Services: More than 4 times per year    Active Member of [employer] or Organizations: No    Attends Engineer, structural: Never    Marital Status: Divorced    Tobacco Counseling Counseling given: Not Answered   Clinical Intake:  Pre-visit preparation completed: Yes  Pain : No/denies pain     Nutritional Risks: None Diabetes: Yes CBG done?: No Did pt. bring in CBG monitor from home?: No  How often do you need to have someone help you when you read instructions, pamphlets, or other written materials from your doctor or pharmacy?: 1 - Never  Interpreter Needed?: No  Information entered by ::  NAllen LPN   Activities of Daily Living    11/22/2022    2:06 PM 11/26/2021   10:30 AM  In your present state of health, do you have any difficulty performing the following activities:  Hearing? 0 0  Vision? 0 1  Difficulty concentrating or making decisions? 1 1  Comment due to mental health   Walking or climbing stairs? 1 1  Comment due to legs   Dressing or bathing? 1 1  Doing errands, shopping? 1 1  Preparing Food and eating ? Y Y  Using the Toilet? N Y  In the past six months, have you accidently leaked urine? Y Y  Comment incontinent   Do you have problems with loss of bowel control? Y Y  Managing your Medications? Y Y  Managing your Finances? Malvin Johns  Housekeeping or managing your Housekeeping? Malvin Johns    Patient Care Team: Tysinger, Kermit Balo, PA-C as PCP - General (Family Medicine) End, Cristal Deer, MD as PCP - Cardiology (Cardiology) Rinaldo Cloud, MD as Consulting Physician (Cardiology)  Indicate any recent Medical Services you may have received from other than [facility] providers in the past year (date may be approximate).     Assessment:   This is a routine wellness examination for Jewelz.  Hearing/Vision screen Hearing Screening - Comments:: Denies hearing issues Vision Screening - Comments:: [facility]   Goals Addressed             This Visit's Progress    Patient Stated       11/22/2022, control racing thoughts       Depression Screen    11/22/2022    2:18 PM 03/22/2022   10:09 AM 11/26/2021   10:27 AM 05/12/2021    8:42 AM 11/24/2020    9:22 AM 10/27/2020   10:21 AM 10/14/2019   11:14 AM  PHQ 2/9 Scores  PHQ - 2 Score  6 6 2  0 6 6  PHQ- 9 Score  22 9  12 0 24 12  Exception Documentation Patient refusal          Fall Risk    11/22/2022    2:17 PM 03/22/2022   10:08 AM 11/26/2021   10:26 AM 11/24/2020    9:22 AM 10/27/2020   10:21 AM  Fall Risk   Falls in the past year? 1 1 0 0 0  Comment due to legs      Number falls in past yr: 1 1 0 0 0  Injury  with Fall? 1 1 0 0 0  Risk for fall due to : History of fall(s);Impaired mobility;Medication side effect;Impaired balance/gait Impaired balance/gait Impaired balance/gait;Medication side effect No Fall Risks No Fall Risks  Follow up Falls prevention discussed;Falls evaluation completed Falls evaluation completed Falls prevention discussed;Education provided;Falls evaluation completed Falls evaluation completed Falls evaluation completed    MEDICARE RISK AT HOME: Medicare Risk at Home Any stairs in or around the home?: No If so, are there any without handrails?: No Home free of loose throw rugs in walkways, pet beds, electrical cords, etc?: Yes Adequate lighting in your home to reduce risk of falls?: Yes Life alert?: Yes Use of a cane, walker or w/c?: Yes Grab bars in the bathroom?: Yes Shower chair or bench in shower?: Yes Elevated toilet seat or a handicapped toilet?: Yes  TIMED UP AND GO:  Was the test performed?  No    Cognitive Function:        11/22/2022    2:19 PM 11/26/2021   10:31 AM  6CIT Screen  What Year? 0 points 4 points  What month? 0 points 0 points  What time? 3 points 3 points  Count back from 20 4 points 4 points  Months in reverse 4 points 4 points  Repeat phrase 8 points 10 points  Total Score 19 points 25 points    Immunizations Immunization History  Administered Date(s) Administered   Influenza Whole 02/04/2010   Influenza, Seasonal, Injecte, Preservative Fre 11/04/2022   Influenza,inj,Quad PF,6+ Mos 10/27/2020, 11/29/2021   Influenza-Unspecified 11/29/2014, 11/28/2017   Moderna SARS-COV2 Booster Vaccination 03/06/2020   Moderna Sars-Covid-2 Vaccination 05/09/2019, 06/11/2019   PNEUMOCOCCAL CONJUGATE-20 11/29/2021   Pneumococcal Polysaccharide-23 10/27/2020   Td 02/29/2008    TDAP status: Due, Education has been provided regarding the importance of this vaccine. Advised may receive this vaccine at local pharmacy or Health Dept. Aware to provide  a copy of the vaccination record if obtained from local pharmacy or Health Dept. Verbalized acceptance and understanding.  Flu Vaccine status: Up to date  Pneumococcal vaccine status: Up to date  Covid-19 vaccine status: Declined, Education has been provided regarding the importance of this vaccine but patient still declined. Advised may receive this vaccine at local pharmacy or Health Dept.or vaccine clinic. Aware to provide a copy of the vaccination record if obtained from local pharmacy or Health Dept. Verbalized acceptance and understanding.  Qualifies for Shingles Vaccine? Yes   Zostavax completed No   Shingrix Completed?: No.    Education has been provided regarding the importance of this vaccine. Patient has been advised to call insurance company to determine out of pocket expense if they have not yet received this vaccine. Advised may also receive vaccine at local pharmacy or Health Dept. Verbalized acceptance and understanding.  Screening Tests Health Maintenance  Topic Date Due   Cervical Cancer Screening (HPV/Pap Cotest)  08/03/2012   COVID-19 Vaccine (3 - Moderna risk series) 04/03/2020   Zoster Vaccines- Shingrix (1 of 2) 02/03/2023 (  Originally 09/21/1984)   HEMOGLOBIN A1C  03/12/2023   Diabetic kidney evaluation - Urine ACR  03/23/2023   OPHTHALMOLOGY EXAM  07/28/2023   FOOT EXAM  09/26/2023   Diabetic kidney evaluation - eGFR measurement  11/04/2023   Medicare Annual Wellness (AWV)  11/22/2023   Colonoscopy  05/13/2024   MAMMOGRAM  07/14/2024   Hepatitis C Screening  Completed   HIV Screening  Completed   HPV VACCINES  Aged Out   DTaP/Tdap/Td  Discontinued   INFLUENZA VACCINE  Discontinued    Health Maintenance  Health Maintenance Due  Topic Date Due   Cervical Cancer Screening (HPV/Pap Cotest)  08/03/2012   COVID-19 Vaccine (3 - Moderna risk series) 04/03/2020    Colorectal cancer screening: Type of screening: Colonoscopy. Completed 05/14/2014. Repeat every 10  years  Mammogram status: Completed 06/24/2022. Repeat every year  Bone Density status: n/a  Lung Cancer Screening: (Low Dose CT Chest recommended if Age 63-80 years, 20 pack-year currently smoking OR have quit w/in 15years.) does not qualify.   Lung Cancer Screening Referral: no  Additional Screening:  Hepatitis C Screening: does qualify; Completed 10/27/2020  Vision Screening: Recommended annual ophthalmology exams for early detection of glaucoma and other disorders of the eye. Is the patient up to date with their annual eye exam?  Yes  Who is the provider or what is the name of the office in which the patient attends annual eye exams? [facility] If pt is not established with a provider, would they like to be referred to a provider to establish care? No .   Dental Screening: Recommended annual dental exams for proper oral hygiene  Diabetic Foot Exam: Diabetic Foot Exam: Completed 09/26/2022  Community Resource Referral / Chronic Care Management: CRR required this visit?  No   CCM required this visit?  No     Plan:     I have personally reviewed and noted the following in the patient's chart:   Medical and social history Use of alcohol, tobacco or illicit drugs  Current medications and supplements including opioid prescriptions. Patient is not currently taking opioid prescriptions. Functional ability and status Nutritional status Physical activity Advanced directives List of other physicians Hospitalizations, surgeries, and ER visits in previous 12 months Vitals Screenings to include cognitive, depression, and falls Referrals and appointments  In addition, I have reviewed and discussed with patient certain preventive protocols, quality metrics, and best practice recommendations. A written personalized care plan for preventive services as well as general preventive health recommendations were provided to patient.     Barb Merino, LPN   10/08/9145   After Visit  Summary: (MyChart) Due to this being a telephonic visit, the after visit summary with patients personalized plan was offered to patient via MyChart   Nurse Notes: none

## 2022-11-25 ENCOUNTER — Other Ambulatory Visit (HOSPITAL_COMMUNITY): Payer: Self-pay

## 2022-12-09 ENCOUNTER — Encounter: Payer: Self-pay | Admitting: Physical Medicine & Rehabilitation

## 2022-12-09 ENCOUNTER — Encounter: Payer: 59 | Attending: Physical Medicine & Rehabilitation | Admitting: Physical Medicine & Rehabilitation

## 2022-12-09 VITALS — BP 167/91 | HR 73 | Ht 64.0 in | Wt 200.0 lb

## 2022-12-09 DIAGNOSIS — M79672 Pain in left foot: Secondary | ICD-10-CM | POA: Diagnosis not present

## 2022-12-09 DIAGNOSIS — F319 Bipolar disorder, unspecified: Secondary | ICD-10-CM | POA: Insufficient documentation

## 2022-12-09 DIAGNOSIS — M79671 Pain in right foot: Secondary | ICD-10-CM | POA: Insufficient documentation

## 2022-12-09 DIAGNOSIS — Z794 Long term (current) use of insulin: Secondary | ICD-10-CM

## 2022-12-09 DIAGNOSIS — I10 Essential (primary) hypertension: Secondary | ICD-10-CM | POA: Diagnosis not present

## 2022-12-09 DIAGNOSIS — E1142 Type 2 diabetes mellitus with diabetic polyneuropathy: Secondary | ICD-10-CM | POA: Insufficient documentation

## 2022-12-09 DIAGNOSIS — E559 Vitamin D deficiency, unspecified: Secondary | ICD-10-CM | POA: Diagnosis not present

## 2022-12-09 DIAGNOSIS — E1169 Type 2 diabetes mellitus with other specified complication: Secondary | ICD-10-CM | POA: Diagnosis not present

## 2022-12-09 DIAGNOSIS — E782 Mixed hyperlipidemia: Secondary | ICD-10-CM | POA: Diagnosis not present

## 2022-12-09 MED ORDER — AMITRIPTYLINE HCL 75 MG PO TABS: 75.0000 mg | ORAL_TABLET | Freq: Every evening | ORAL | 3 refills | Status: DC | PRN

## 2022-12-09 NOTE — Patient Instructions (Signed)
We will plan to do Qutenza next visit You should get a call about the TENS unit

## 2022-12-09 NOTE — Progress Notes (Signed)
Subjective:    Patient ID: Cheyenne Alvarez, female    DOB: 08/16/1965, 57 y.o.   MRN: [id number]  HPI HPI  Adel Kourtnie Sachs is a 57 y.o. year old female  who  has a past medical history of Alopecia, Anemia, Anxiety, Bipolar 1 disorder (HCC), COPD (chronic obstructive pulmonary disease) (HCC), Coronary artery disease, Depression, Diabetes mellitus (2010), Gastritis, GERD (gastroesophageal reflux disease), Headache, Hypertension (2010), Obesity, Pneumonia, PONV (postoperative nausea and vomiting), PTSD (post-traumatic stress disorder), Schizophrenia (HCC), and Tobacco use.   They are presenting to PM&R clinic as a new patient for pain management evaluation. They were referred by Dr. Aleen Campi for treatment of bilateral lower extremity pain.  Patient is here today with her uncle.  She reports having pain in her feet that has been worsening for the past 6 to 7 months.  She reports pain is worse at night and usually starts as itching and becomes burning.  She says pain feels like fire.  She reports the pain is so bad she cannot wear shoes.  Pain will sometimes shoot up to her calves.  She reports decreased sensation in her bilateral feet.  She has a history of diabetes mellitus, A1c 6.9 on 09/09/2022 however it was much less controlled several years in the past.  She has been started on amitriptyline and dose has been gradually increased to 50 mg daily.  Patient also reports having some soreness in her left heel, ball of her right foot.  She thinks she may have had gout in the past but says she was never diagnosed with this.  She reports muscle cramps in her feet, improved with eating mustard.   Red flag symptoms: No red flags for back pain endorsed in Hx or ROS  Medications tried: Topical medications - denies, volaren gel,not helping anymore  Nsaids ibuprofen- told to stop due to hernia/GI issues Tylenol  didn't help  Opiates  denies  Gabapentin-  didn't help pain and caused memory loss   Lyrica-does not want to do it due to concerns of weight gain TCAs  - amitriptyline 50mg  not helping, denies side effects with this medication SNRIs - Denies    Other treatments: PT/OT - Denies  TENs unit denies   Prior UDS results:     Component Value Date/Time   LABOPIA NONE DETECTED 12/06/2016 1613   COCAINSCRNUR NONE DETECTED 12/06/2016 1613   LABBENZ POSITIVE (A) 12/06/2016 1613   AMPHETMU NONE DETECTED 12/06/2016 1613   THCU NONE DETECTED 12/06/2016 1613   LABBARB NONE DETECTED 12/06/2016 1613       Pain Inventory Average Pain 8 Pain Right Now 8 My pain is constant, sharp, burning, dull, stabbing, tingling, and aching  In the last 24 hours, has pain interfered with the following? General activity 10 Relation with others 10 Enjoyment of life 10 What TIME of day is your pain at its worst? daytime and night Sleep (in general) Poor  Pain is worse with: walking and standing Pain improves with: rest Relief from Meds:  N/A  walk without assistance ability to climb steps?  no do you drive?  no  disabled: date disabled . I need assistance with the following:  dressing, meal prep, household duties, and shopping  bladder control problems numbness tremor tingling trouble walking spasms dizziness confusion depression anxiety  Any changes since last visit?  no  Any changes since last visit?  no    Family History  Problem Relation Age of Onset   Breast cancer Maternal Grandmother  Seizures Father    Diabetes Mother    Kidney failure Mother    Cancer Brother    Mental illness Brother    Breast cancer Sister 52   Diabetes Sister    Drug abuse Sister    Hypertension Sister    Diabetes Other    Cancer Other    Hypertension Other    Social History   Socioeconomic History   Marital status: Divorced    Spouse name: Not on file   Number of children: Not on file   Years of education: Not on file   Highest education level: Not on file   Occupational History   Not on file  Tobacco Use   Smoking status: Former    Current packs/day: 0.00    Types: Cigarettes    Quit date: 12/18/2013    Years since quitting: 8.9   Smokeless tobacco: Never  Vaping Use   Vaping status: Never Used  Substance and Sexual Activity   Alcohol use: No    Alcohol/week: 0.0 standard drinks of alcohol   Drug use: No   Sexual activity: Not Currently    Partners: Male    Birth control/protection: Surgical  Other Topics Concern   Not on file  Social History Narrative   Not on file   Social Determinants of Health   Financial Resource Strain: Low Risk  (11/22/2022)   Overall Financial Resource Strain (CARDIA)    Difficulty of Paying Living Expenses: Not hard at all  Food Insecurity: No Food Insecurity (11/22/2022)   Hunger Vital Sign    Worried About Running Out of Food in the Last Year: Never true    Ran Out of Food in the Last Year: Never true  Transportation Needs: No Transportation Needs (11/22/2022)   PRAPARE - Administrator, Civil Service (Medical): No    Lack of Transportation (Non-Medical): No  Physical Activity: Insufficiently Active (11/22/2022)   Exercise Vital Sign    Days of Exercise per Week: 3 days    Minutes of Exercise per Session: 20 min  Stress: Stress Concern Present (11/22/2022)   [employer] of Occupational Health - Occupational Stress Questionnaire    Feeling of Stress : Rather much  Social Connections: Moderately Isolated (11/22/2022)   Social Connection and Isolation Panel [NHANES]    Frequency of Communication with Friends and Family: More than three times a week    Frequency of Social Gatherings with Friends and Family: More than three times a week    Attends Religious Services: More than 4 times per year    Active Member of [employer] or Organizations: No    Attends Engineer, structural: Never    Marital Status: Divorced   Past Surgical History:  Procedure Laterality Date   ABDOMINAL  HYSTERECTOMY     heavy bleeding   BREAST BIOPSY Right 07/21/2022   MM RT BREAST BX W LOC DEV 1ST LESION IMAGE BX SPEC STEREO GUIDE 07/21/2022 [facility]   CARDIAC CATHETERIZATION     CHOLECYSTECTOMY     COLONOSCOPY  05/14/14   hemorrhoids, otherwise normal; Dr. Charlott Rakes   COLONOSCOPY WITH PROPOFOL N/A 05/14/2014   Procedure: COLONOSCOPY WITH PROPOFOL;  Surgeon: Charlott Rakes, MD;  Location: [facility] ENDOSCOPY;  Service: Endoscopy;  Laterality: N/A;   ESOPHAGOGASTRODUODENOSCOPY (EGD) WITH PROPOFOL N/A 05/14/2014   Procedure: ESOPHAGOGASTRODUODENOSCOPY (EGD) WITH PROPOFOL;  Surgeon: Charlott Rakes, MD;  Location: [facility] ENDOSCOPY;  Service: Endoscopy;  Laterality: N/A;   FRACTURE SURGERY     left  leg   LEG SURGERY     TUBAL LIGATION     Past Medical History:  Diagnosis Date   Alopecia    Anemia    Anxiety    Bipolar 1 disorder (HCC)    COPD (chronic obstructive pulmonary disease) (HCC)    Coronary artery disease    Depression    Diabetes mellitus 2010   Gastritis    GERD (gastroesophageal reflux disease)    Headache    migraines   Hypertension 2010   Obesity    Pneumonia    PONV (postoperative nausea and vomiting)    PTSD (post-traumatic stress disorder)    Schizophrenia (HCC)    Tobacco use    There were no vitals taken for this visit.  Opioid Risk Score:   Fall Risk Score:  `1  Depression screen [facility] 2/9     11/22/2022    2:18 PM 03/22/2022   10:09 AM 11/26/2021   10:27 AM 05/12/2021    8:42 AM 11/24/2020    9:22 AM 10/27/2020   10:21 AM 10/14/2019   11:14 AM  Depression screen PHQ 2/9  Decreased Interest -- 3 3 1  0 3 3  Down, Depressed, Hopeless  3 3 1  0 3 3  PHQ - 2 Score  6 6 2  0 6 6  Altered sleeping  3 1 3  0 3 3  Tired, decreased energy  3 1 3  0 3 3  Change in appetite  3 0 1 0 3 0  Feeling bad or failure about yourself   2 0 1 0 3 0  Trouble concentrating  2 1 2  0 3 0  Moving slowly or fidgety/restless  3 0 0 0 3 0  Suicidal thoughts  0 0 0 0 0 0   PHQ-9 Score  22 9 12  0 24 12  Difficult doing work/chores  Not difficult at all Somewhat difficult  Not difficult at all Very difficult Extremely dIfficult     Review of Systems  Musculoskeletal:  Positive for gait problem.  All other systems reviewed and are negative.     Objective:   Physical Exam  Gen: no distress, normal appearing HEENT: oral mucosa pink and moist, NCAT Chest: normal effort, normal rate of breathing Abd: soft, non-distended Ext: no edema Psych: pleasant, normal affect Skin: intact Neuro:  RUE: 5/5 Deltoid, 5/5 Biceps, 5/5 Triceps, 5/5 Wrist Ext, 5/5 Grip LUE: 5/5 Deltoid, 5/5 Biceps, 5/5 Triceps, 5/5 Wrist Ext, 5/5 Grip RLE: HF 5/5, KE 5/5,  ADF 5/5, APF 5/5 LLE: HF 5/5, KE 5/5, ADF 5/5, APF 5/5  No limb ataxia or cerebellar signs. No abnormal tone appreciated.  DTR decreased throughout bilateral upper and lower extremities Musculoskeletal:  Patient noted to have some tenderness heel of the left foot, over the metatarsal heads of her left foot.  No significant redness or swelling noted today. 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  Patient thinks she has gout in this area and reports that she has had swelling in these areas and the past.  She denies being formally diagnosed with gout in the past.  She has been attempting diet modifications to help. 3) Bipolar disorder/schizoaffective disorder. -Caution with new antidepressant class medications use  1) Discussed foods that can  be helpful for pain 2) TENS unit ordered, Zynex Nexwave 3) Will increase nortriptyline to 75 mg 4)-Discussed Qutenza as an option for neuropathic pain control. Discussed that this is a capsaicin patch, stronger than capsaicin  cream. Discussed that it is currently approved for diabetic peripheral neuropathy and post-herpetic neuralgia, but that it has also shown benefit in treating other forms of neuropathy. Provided patient with link to site to learn more about the patch: https://www.clark.biz/. Discussed that the patch would be placed in office and benefits usually last 3 months. Discussed that unintended exposure to capsaicin can cause severe irritation of eyes, mucous membranes, respiratory tract, and skin, but that Qutenza is a local treatment and does not have the systemic side effects of other nerve medications. Discussed that there may be pain, itching, erythema, and decreased sensory function associated with the application of Qutenza. Side effects usually subside within 1 week. A cold pack of analgesic medications can help with these side effects. Blood pressure can also be increased due to pain associated with administration of the patch. -Will schedule for Qutenza treatment

## 2022-12-14 ENCOUNTER — Other Ambulatory Visit: Payer: Self-pay | Admitting: Medical

## 2022-12-16 ENCOUNTER — Ambulatory Visit: Payer: 59 | Admitting: Medical

## 2022-12-21 ENCOUNTER — Ambulatory Visit (INDEPENDENT_AMBULATORY_CARE_PROVIDER_SITE_OTHER): Payer: 59 | Admitting: Medical

## 2022-12-21 VITALS — BP 122/80 | HR 98 | Wt 202.6 lb

## 2022-12-21 DIAGNOSIS — M25561 Pain in right knee: Secondary | ICD-10-CM | POA: Diagnosis not present

## 2022-12-21 DIAGNOSIS — Z794 Long term (current) use of insulin: Secondary | ICD-10-CM

## 2022-12-21 DIAGNOSIS — L608 Other nail disorders: Secondary | ICD-10-CM | POA: Diagnosis not present

## 2022-12-21 DIAGNOSIS — M25579 Pain in unspecified ankle and joints of unspecified foot: Secondary | ICD-10-CM | POA: Diagnosis not present

## 2022-12-21 DIAGNOSIS — E1140 Type 2 diabetes mellitus with diabetic neuropathy, unspecified: Secondary | ICD-10-CM

## 2022-12-21 DIAGNOSIS — E669 Obesity, unspecified: Secondary | ICD-10-CM

## 2022-12-21 DIAGNOSIS — E1165 Type 2 diabetes mellitus with hyperglycemia: Secondary | ICD-10-CM

## 2022-12-21 DIAGNOSIS — M79671 Pain in right foot: Secondary | ICD-10-CM | POA: Diagnosis not present

## 2022-12-21 DIAGNOSIS — M79672 Pain in left foot: Secondary | ICD-10-CM | POA: Diagnosis not present

## 2022-12-21 MED ORDER — TIRZEPATIDE 10 MG/0.5ML ~~LOC~~ SOAJ: 10.0000 mg | SUBCUTANEOUS | 0 refills | Status: DC

## 2022-12-21 MED ORDER — TIRZEPATIDE 12.5 MG/0.5ML ~~LOC~~ SOAJ: 12.5000 mg | SUBCUTANEOUS | 1 refills | Status: DC

## 2022-12-21 NOTE — Progress Notes (Signed)
Subjective:  Cheyenne Alvarez is a 56 y.o. female who presents for Chief Complaint  Patient presents with   Follow-up    3 week follow-up. Changed trucility to mounjaro and changed [employer] to [employer]     Here for med check, follow-up from recent visit.  Last visit we increase Mounjaro to help with blood sugar control and weight loss.  She is tolerating this just fine.  Ready to go to the next higher dose.  She feels like she eats pretty healthy but does do sugar-free candy quite a bit.  She denies eating a lot of food in general  Since last visit she did see neurorehab.  They increased her amitriptyline dose to help with pain.  They talked about doing some topical treatment as well.  There was discussion about possible gout.  Here to have screening for gout  Insomnia-doing okay on Belsomra.  Was on trazodone prior  She feels like her toenails have gotten more darker brown in recent weeks  Gets some right knee pain every now and then.  No injury.  No swelling.  No other aggravating or relieving factors.    No other c/o.  Past Medical History:  Diagnosis Date   Alopecia    Anemia    Anxiety    Bipolar 1 disorder (HCC)    COPD (chronic obstructive pulmonary disease) (HCC)    Coronary artery disease    Depression    Diabetes mellitus 2010   Gastritis    GERD (gastroesophageal reflux disease)    Headache    migraines   Hypertension 2010   Obesity    Pneumonia    PONV (postoperative nausea and vomiting)    PTSD (post-traumatic stress disorder)    Schizophrenia (HCC)    Tobacco use    Current Outpatient Medications on File Prior to Visit  Medication Sig Dispense Refill   albuterol (VENTOLIN HFA) 108 (90 Base) MCG/ACT inhaler Inhale 2 puffs into the lungs every 6 (six) hours as needed for wheezing or shortness of breath. 54 g 0   amitriptyline (ELAVIL) 75 MG tablet Take 1 tablet (75 mg total) by mouth at bedtime as needed for sleep. 30 tablet 3   amLODipine (NORVASC)  10 MG tablet Take 1 tablet (10 mg total) by mouth daily. TAKE 1 TABLET(10 MG) BY MOUTH DAILY 90 tablet 3   atenolol (TENORMIN) 50 MG tablet Take 1 tablet (50 mg total) by mouth 2 (two) times daily. 180 tablet 3   Cholecalciferol (VITAMIN D) 50 MCG (2000 UT) CAPS Take 1 capsule (2,000 Units total) by mouth daily. 90 capsule 3   ferrous sulfate 325 (65 FE) MG EC tablet Take 1 tablet (325 mg total) by mouth daily. 90 tablet 1   fluticasone-salmeterol (ADVAIR) 250-50 MCG/ACT AEPB Inhale 1 puff twice daily, morning & at bedtime 60 each 11   fluvoxaMINE (LUVOX) 25 MG tablet Take 25 mg by mouth 2 (two) times daily.     glimepiride (AMARYL) 2 MG tablet      INGREZZA 80 MG capsule Take 80 mg by mouth daily.     pantoprazole (PROTONIX) 40 MG tablet Take 1 capsule by mouth twice daily 60 tablet 5   rosuvastatin (CRESTOR) 20 MG tablet Take 1 tablet (20 mg total) by mouth daily. 90 tablet 3   Suvorexant (BELSOMRA) 20 MG TABS Take by mouth.     nitroGLYCERIN (NITROSTAT) 0.4 MG SL tablet Place 0.4 mg under the tongue every 5 (five) minutes as needed for chest  pain. Reported on 07/15/2015     No current facility-administered medications on file prior to visit.     The following portions of the patient's history were reviewed and updated as appropriate: allergies, current medications, past family history, past medical history, past social history, past surgical history and problem list.  ROS Otherwise as in subjective above  Objective: BP 122/80   Pulse 98   Wt 202 lb 9.6 oz (91.9 kg)   BMI 34.78 kg/m   General appearance: alert, no distress, well developed, well nourished Somewhat discoloration of the distal third of the toenails suggesting possible changes with onychomycosis     Assessment: Encounter Diagnoses  Name Primary?   Arthralgia of foot, unspecified laterality Yes   Type 2 diabetes mellitus with hyperglycemia, with long-term current use of insulin (HCC)    Foot pain, bilateral     Obesity with serious comorbidity, unspecified class, unspecified obesity type    Type 2 diabetes mellitus with diabetic neuropathy, without long-term current use of insulin (HCC)    Toenail deformity    Right knee pain, unspecified chronicity      Plan: Uric acid screen today.  Foot pain, neuropathy-follow-up with physical med.  Obesity-continues to lose weight through healthy diet and exercise.  Increase Mounjaro dose regarding diabetes and obesity  Diabetes-follow-up with endocrinology, increase Mounjaro dose to 10 mg for the next month then go to 12.5 mg for the next 2 months  Toenail deformity-consider possible treatment for toenail fungus    Cheyenne Alvarez was seen today for follow-up.  Diagnoses and all orders for this visit:  Arthralgia of foot, unspecified laterality -     Uric acid  Type 2 diabetes mellitus with hyperglycemia, with long-term current use of insulin (HCC) -     Hemoglobin A1c  Foot pain, bilateral  Obesity with serious comorbidity, unspecified class, unspecified obesity type  Type 2 diabetes mellitus with diabetic neuropathy, without long-term current use of insulin (HCC)  Toenail deformity  Right knee pain, unspecified chronicity  Other orders -     tirzepatide (MOUNJARO) 10 MG/0.5ML Pen; Inject 10 mg into the skin once a week. -     tirzepatide (MOUNJARO) 12.5 MG/0.5ML Pen; Inject 12.5 mg into the skin once a week.   Follow up: pending labs

## 2022-12-22 LAB — HEMOGLOBIN A1C
Est. average glucose Bld gHb Est-mCnc: 169 mg/dL
Hgb A1c MFr Bld: 7.5 % — ABNORMAL HIGH (ref 4.8–5.6)

## 2022-12-22 LAB — URIC ACID: Uric Acid: 3.6 mg/dL (ref 3.0–7.2)

## 2022-12-22 NOTE — Progress Notes (Signed)
Results sent through MyChart

## 2023-01-02 ENCOUNTER — Other Ambulatory Visit: Payer: Self-pay | Admitting: Medical

## 2023-01-06 ENCOUNTER — Encounter: Payer: 59 | Attending: Physical Medicine & Rehabilitation | Admitting: Physical Medicine & Rehabilitation

## 2023-01-06 DIAGNOSIS — F319 Bipolar disorder, unspecified: Secondary | ICD-10-CM | POA: Insufficient documentation

## 2023-01-06 DIAGNOSIS — M79671 Pain in right foot: Secondary | ICD-10-CM | POA: Insufficient documentation

## 2023-01-06 DIAGNOSIS — E1142 Type 2 diabetes mellitus with diabetic polyneuropathy: Secondary | ICD-10-CM | POA: Insufficient documentation

## 2023-01-06 DIAGNOSIS — M79672 Pain in left foot: Secondary | ICD-10-CM | POA: Insufficient documentation

## 2023-01-14 ENCOUNTER — Other Ambulatory Visit: Payer: Self-pay | Admitting: Medical

## 2023-01-16 NOTE — Telephone Encounter (Signed)
Dose was increased

## 2023-02-08 ENCOUNTER — Other Ambulatory Visit: Payer: Self-pay | Admitting: Medical

## 2023-02-20 ENCOUNTER — Other Ambulatory Visit (HOSPITAL_COMMUNITY): Payer: Self-pay

## 2023-02-20 ENCOUNTER — Telehealth: Payer: Self-pay

## 2023-02-20 NOTE — Telephone Encounter (Signed)
Pharmacy Patient Advocate Encounter   Received notification from CoverMyMeds that prior authorization for [facility] is required/requested.   Insurance verification completed.   The patient is insured through [facility] .   Per test claim: PA required; PA submitted to above mentioned insurance via CoverMyMeds Key/confirmation #/EOC Key: [id number]  Status is pending

## 2023-02-21 NOTE — Telephone Encounter (Signed)
Pharmacy Patient Advocate Encounter  Received notification from [facility] that Prior Authorization for Cheyenne Alvarez has been APPROVED  to 12.31.2025

## 2023-03-17 ENCOUNTER — Other Ambulatory Visit: Payer: Self-pay | Admitting: Medical

## 2023-03-17 LAB — HEMOGLOBIN A1C: Hemoglobin A1C: 7.5

## 2023-03-23 ENCOUNTER — Ambulatory Visit (INDEPENDENT_AMBULATORY_CARE_PROVIDER_SITE_OTHER): Payer: 59 | Admitting: Medical

## 2023-03-23 VITALS — BP 122/82 | HR 81 | Wt 202.8 lb

## 2023-03-23 DIAGNOSIS — R609 Edema, unspecified: Secondary | ICD-10-CM | POA: Diagnosis not present

## 2023-03-23 DIAGNOSIS — Z794 Long term (current) use of insulin: Secondary | ICD-10-CM

## 2023-03-23 DIAGNOSIS — E1169 Type 2 diabetes mellitus with other specified complication: Secondary | ICD-10-CM

## 2023-03-23 DIAGNOSIS — D509 Iron deficiency anemia, unspecified: Secondary | ICD-10-CM

## 2023-03-23 DIAGNOSIS — E1165 Type 2 diabetes mellitus with hyperglycemia: Secondary | ICD-10-CM | POA: Diagnosis not present

## 2023-03-23 DIAGNOSIS — I10 Essential (primary) hypertension: Secondary | ICD-10-CM

## 2023-03-23 DIAGNOSIS — E785 Hyperlipidemia, unspecified: Secondary | ICD-10-CM

## 2023-03-23 DIAGNOSIS — F250 Schizoaffective disorder, bipolar type: Secondary | ICD-10-CM

## 2023-03-23 DIAGNOSIS — Z741 Need for assistance with personal care: Secondary | ICD-10-CM

## 2023-03-23 MED ORDER — ATENOLOL 50 MG PO TABS: 50.0000 mg | ORAL_TABLET | Freq: Two times a day (BID) | ORAL | 1 refills | Status: DC

## 2023-03-23 MED ORDER — TIRZEPATIDE 15 MG/0.5ML ~~LOC~~ SOAJ: 15.0000 mg | SUBCUTANEOUS | 2 refills | Status: DC

## 2023-03-23 MED ORDER — GLIMEPIRIDE 2 MG PO TABS: 2.0000 mg | ORAL_TABLET | Freq: Every day | ORAL | 1 refills | Status: DC

## 2023-03-23 MED ORDER — ROSUVASTATIN CALCIUM 20 MG PO TABS: 20.0000 mg | ORAL_TABLET | Freq: Every day | ORAL | 2 refills | Status: DC

## 2023-03-23 MED ORDER — AMLODIPINE BESYLATE 10 MG PO TABS: 10.0000 mg | ORAL_TABLET | Freq: Every day | ORAL | 2 refills | Status: DC

## 2023-03-23 MED ORDER — VITAMIN D3 50 MCG (2000 UT) PO CAPS: 2000.0000 [IU] | ORAL_CAPSULE | Freq: Every day | ORAL | 2 refills | Status: DC

## 2023-03-23 MED ORDER — FERROUS SULFATE 325 (65 FE) MG PO TBEC: 325.0000 mg | DELAYED_RELEASE_TABLET | Freq: Every day | ORAL | 0 refills | Status: DC

## 2023-03-23 NOTE — Progress Notes (Signed)
I have had add this on

## 2023-03-23 NOTE — Progress Notes (Signed)
Subjective:  Cheyenne Alvarez is a 58 y.o. female who presents for Chief Complaint  Patient presents with   Follow-up    3 month follow-up. Had A1c done yesterday and it was 7.5%. Circulation was normal on right and left leg.      Here alone today.  Here for med check  Hyperlipidemia-a few months ago she had an elevated CK so we discontinued her cholesterol medicine short-term since she was having some cramps.  She has resumed her Crestor at this point  The cramps were never really improved or worsened by stopping Crestor.  She still gets some cramps  She says she is drinking plenty of water daily  Hypertension-compliant with amlodipine and atenolol  Diabetes-compliant with Mounjaro 12.5 mg weekly and glimepiride 2 mg daily.  She does check blood sugars and they have been a little bit elevated lately.  She feels like she is eating healthy.  She is not losing weight like she thought she would on the [facility].  Typically eats 2 meals a day  She continues with routine follow-up with psychiatry and her pain doctor  She notes that the home health nurse came out per insurance this past week.  Blood pressure was elevated at home and her hemoglobin A1c was 7.5%.  There was a concern about swelling and she says the nurse told her that her amlodipine was causing some swelling  No other aggravating or relieving factors.    No other c/o.  Past Medical History:  Diagnosis Date   Alopecia    Anemia    Anxiety    Bipolar 1 disorder (HCC)    COPD (chronic obstructive pulmonary disease) (HCC)    Coronary artery disease    Depression    Diabetes mellitus 2010   Gastritis    GERD (gastroesophageal reflux disease)    Headache    migraines   Hypertension 2010   Obesity    Pneumonia    PONV (postoperative nausea and vomiting)    PTSD (post-traumatic stress disorder)    Schizophrenia (HCC)    Tobacco use    Current Outpatient Medications on File Prior to Visit  Medication Sig Dispense  Refill   amitriptyline (ELAVIL) 75 MG tablet Take 1 tablet (75 mg total) by mouth at bedtime as needed for sleep. 30 tablet 3   busPIRone (BUSPAR) 15 MG tablet Take 15 mg by mouth 2 (two) times daily.     fluticasone-salmeterol (ADVAIR) 250-50 MCG/ACT AEPB Inhale 1 puff twice daily, morning & at bedtime 60 each 2   fluvoxaMINE (LUVOX) 25 MG tablet Take 25 mg by mouth 2 (two) times daily.     INGREZZA 80 MG capsule Take 80 mg by mouth daily.     lamoTRIgine Starter Kit-Orange 42 x 25 MG & 7 x 100 MG KIT Take by mouth as directed.     pantoprazole (PROTONIX) 40 MG tablet Take 1 capsule by mouth twice daily 60 tablet 5   Suvorexant (BELSOMRA) 20 MG TABS Take by mouth.     VENTOLIN HFA 108 (90 Base) MCG/ACT inhaler INHALE 2 PUFFS INTO THE LUNGS EVERY 6 HOURS AS NEEDED FOR WHEEZING OR SHORTNESS OF BREATH 54 g 0   nitroGLYCERIN (NITROSTAT) 0.4 MG SL tablet Place 0.4 mg under the tongue every 5 (five) minutes as needed for chest pain. Reported on 07/15/2015     No current facility-administered medications on file prior to visit.     The following portions of the patient's history were reviewed and  updated as appropriate: allergies, current medications, past family history, past medical history, past social history, past surgical history and problem list.  ROS Otherwise as in subjective above  Objective: BP 122/82   Pulse 81   Wt 202 lb 12.8 oz (92 kg)   SpO2 99%   BMI 34.81 kg/m   General appearance: alert, no distress, well developed, well nourished Neck: supple, no lymphadenopathy, no thyromegaly, no masses Heart: RRR, normal S1, S2, no murmurs Lungs: CTA bilaterally, no wheezes, rhonchi, or rales Pulses: 2+ radial pulses, 2+ pedal pulses, normal cap refill Ext: no edema Psych: A bit more anxious and fidgety today but answers questions appropriately    Assessment: Encounter Diagnoses  Name Primary?   Type 2 diabetes mellitus with hyperglycemia, with long-term current use of  insulin (HCC) Yes   Hyperlipidemia associated with type 2 diabetes mellitus (HCC)    Edema, unspecified type    Essential hypertension    Schizoaffective disorder, bipolar type (HCC)    Requires daily assistance for activities of daily living (ADL) and comfort needs    Iron deficiency anemia, unspecified iron deficiency anemia type      Plan: Diabetes hemoglobin A1c 7.5% recorded by home health nurse a week ago She will finish out the 3 weeks she has of Mounjaro 12.5 mg weekly then go up to Mounjaro 15 mg dose weekly Continue glimepiride 2 mg daily Continue glucose monitoring  Hyperlipidemia continue rosuvastatin/Crestor 20 mg daily Labs today.  We have not done labs on prior visits because she has always come in nonfasting  Hypertension Continue amlodipine 10 mg daily Continue atenolol 50 mg twice daily  Edema reported but none on exam.  Labs today for evaluation.  Reassured the amlodipine does not seem to be causing any major problems with swelling   Schizoaffective disorder, anxiety-sees psychiatry who manages her medications   She has a home health aide that comes in to help with daily ADLs    Cheyenne Alvarez was seen today for follow-up.  Diagnoses and all orders for this visit:  Type 2 diabetes mellitus with hyperglycemia, with long-term current use of insulin (HCC) -     Microalbumin/Creatinine Ratio, Urine  Hyperlipidemia associated with type 2 diabetes mellitus (HCC) -     Lipid panel  Edema, unspecified type -     Basic metabolic panel  Essential hypertension  Schizoaffective disorder, bipolar type (HCC)  Requires daily assistance for activities of daily living (ADL) and comfort needs  Iron deficiency anemia, unspecified iron deficiency anemia type -     Iron, TIBC and Ferritin Panel  Other orders -     glimepiride (AMARYL) 2 MG tablet; Take 1 tablet (2 mg total) by mouth daily with breakfast. -     tirzepatide (MOUNJARO) 15 MG/0.5ML Pen; Inject 15 mg into  the skin once a week. -     rosuvastatin (CRESTOR) 20 MG tablet; Take 1 tablet (20 mg total) by mouth daily. -     amLODipine (NORVASC) 10 MG tablet; Take 1 tablet (10 mg total) by mouth daily. -     atenolol (TENORMIN) 50 MG tablet; Take 1 tablet (50 mg total) by mouth 2 (two) times daily. -     ferrous sulfate 325 (65 FE) MG EC tablet; Take 1 tablet (325 mg total) by mouth daily. -     Cholecalciferol (VITAMIN D3) 50 MCG (2000 UT) capsule; Take 1 capsule (2,000 Units total) by mouth daily.    Follow up: pending labs

## 2023-03-25 LAB — LIPID PANEL
Chol/HDL Ratio: 2.4 {ratio} (ref 0.0–4.4)
Cholesterol, Total: 159 mg/dL (ref 100–199)
HDL: 66 mg/dL (ref >39)
LDL Chol Calc (NIH): 78 mg/dL (ref 0–99)
Triglycerides: 77 mg/dL (ref 0–149)
VLDL Cholesterol Cal: 15 mg/dL (ref 5–40)

## 2023-03-25 LAB — BASIC METABOLIC PANEL
BUN/Creatinine Ratio: 13 (ref 9–23)
BUN: 16 mg/dL (ref 6–24)
CO2: 21 mmol/L (ref 20–29)
Calcium: 9.5 mg/dL (ref 8.7–10.2)
Chloride: 100 mmol/L (ref 96–106)
Creatinine, Ser: 1.26 mg/dL — ABNORMAL HIGH (ref 0.57–1.00)
Glucose: 151 mg/dL — ABNORMAL HIGH (ref 70–99)
Potassium: 4.7 mmol/L (ref 3.5–5.2)
Sodium: 138 mmol/L (ref 134–144)
eGFR: 49 mL/min/{1.73_m2} — ABNORMAL LOW (ref >59)

## 2023-03-25 LAB — MICROALBUMIN / CREATININE URINE RATIO
Creatinine, Urine: 173.8 mg/dL
Microalb/Creat Ratio: 8 mg/g{creat} (ref 0–29)
Microalbumin, Urine: 13.7 ug/mL

## 2023-03-26 NOTE — Progress Notes (Signed)
Labs show chronic kidney disease which we see eventually with people with diabetes and high blood pressure.    In general drink at least 100 ounces of water daily.    Due to abnormal kidney function, and in order to protect your kidneys, I recommend you avoid medications that can harm the kidneys such as ibuprofen, Aleve, Advil, Motrin, Naprosyn, or prescription anti-inflammatories which are used for pain, inflammation, and arthritis.   You should avoid dehydration which can harm the kidneys.  Rest of labs are stable.  Continue medications as usual  Follow up in 4 months

## 2023-03-27 ENCOUNTER — Other Ambulatory Visit: Payer: Self-pay | Admitting: Physical Medicine & Rehabilitation

## 2023-03-28 ENCOUNTER — Telehealth: Payer: Self-pay | Admitting: Medical

## 2023-03-28 DIAGNOSIS — E1165 Type 2 diabetes mellitus with hyperglycemia: Secondary | ICD-10-CM

## 2023-03-28 MED ORDER — BLOOD GLUCOSE TEST VI STRP: ORAL_STRIP | 0 refills | Status: DC

## 2023-03-28 MED ORDER — LANCET DEVICE MISC: 0 refills | Status: DC

## 2023-03-28 MED ORDER — LANCETS MISC. MISC: 0 refills | Status: DC

## 2023-03-28 MED ORDER — BLOOD GLUCOSE MONITORING SUPPL DEVI: 0 refills | Status: DC

## 2023-03-28 NOTE — Telephone Encounter (Signed)
Pt left message with answering service  Needs more test strips and needles, she needs to test her sugars more often with the new machine

## 2023-03-28 NOTE — Telephone Encounter (Signed)
Sent in meter, test strips and lancets

## 2023-03-29 ENCOUNTER — Other Ambulatory Visit: Payer: Self-pay | Admitting: Medical

## 2023-03-29 DIAGNOSIS — E1165 Type 2 diabetes mellitus with hyperglycemia: Secondary | ICD-10-CM

## 2023-04-05 ENCOUNTER — Encounter: Payer: Self-pay | Admitting: Internal Medicine

## 2023-04-13 ENCOUNTER — Telehealth: Payer: 59 | Admitting: Medical

## 2023-04-13 VITALS — Wt 200.0 lb

## 2023-04-13 DIAGNOSIS — R6889 Other general symptoms and signs: Secondary | ICD-10-CM

## 2023-04-13 DIAGNOSIS — J029 Acute pharyngitis, unspecified: Secondary | ICD-10-CM | POA: Diagnosis not present

## 2023-04-13 DIAGNOSIS — R509 Fever, unspecified: Secondary | ICD-10-CM | POA: Diagnosis not present

## 2023-04-13 DIAGNOSIS — R059 Cough, unspecified: Secondary | ICD-10-CM | POA: Diagnosis not present

## 2023-04-13 MED ORDER — OSELTAMIVIR PHOSPHATE 75 MG PO CAPS: 75.0000 mg | ORAL_CAPSULE | Freq: Two times a day (BID) | ORAL | 0 refills | Status: DC

## 2023-04-13 NOTE — Progress Notes (Signed)
Subjective:     Patient ID: Cheyenne Alvarez, female   DOB: 1965/08/10, 58 y.o.   MRN: [id number]  This visit type was conducted due to national recommendations for restrictions regarding the COVID-19 Pandemic (e.g. social distancing) in an effort to limit this patient's exposure and mitigate transmission in our community.  Due to their co-morbid illnesses, this patient is at least at moderate risk for complications without adequate follow up.  This format is felt to be most appropriate for this patient at this time.    Documentation for virtual audio and video telecommunications through [facility] encounter:  The patient was located at home. The provider was located in the office. The patient did consent to this visit and is aware of possible charges through their insurance for this visit.  The other persons participating in this telemedicine service were daughter [name]. Time spent on call was 20 minutes and in review of previous records 20 minutes total.  This virtual service is not related to other E/M service within previous 7 days.   HPI Chief Complaint  Patient presents with   flu like symptoms    Flu like sympoms- cough, sore throat, chills, body aches, fever, symptoms started yesterday   Virtual consult for illness.  Symptoms started yesterday with flulike symptoms.  She denies cough, sore throat, body aches, chills, fever.  Having a lot of cough.  Feels fatigue and weak.  No nausea, vomiting, but some loose stool.  Using dayquil and nyquil and mucinex, tylenol for symptoms.  No sick contacts.  No other aggravating or relieving factors. No other complaint.  Past Medical History:  Diagnosis Date   Alopecia    Anemia    Anxiety    Bipolar 1 disorder (HCC)    COPD (chronic obstructive pulmonary disease) (HCC)    Coronary artery disease    Depression    Diabetes mellitus 2010   Gastritis    GERD (gastroesophageal reflux disease)    Headache    migraines    Hypertension 2010   Obesity    Pneumonia    PONV (postoperative nausea and vomiting)    PTSD (post-traumatic stress disorder)    Schizophrenia (HCC)    Tobacco use    Current Outpatient Medications on File Prior to Visit  Medication Sig Dispense Refill   amitriptyline (ELAVIL) 75 MG tablet TAKE 1 TABLET(75 MG) BY MOUTH AT BEDTIME AS NEEDED FOR SLEEP 30 tablet 3   amLODipine (NORVASC) 10 MG tablet Take 1 tablet (10 mg total) by mouth daily. 90 tablet 2   atenolol (TENORMIN) 50 MG tablet Take 1 tablet (50 mg total) by mouth 2 (two) times daily. 180 tablet 1   busPIRone (BUSPAR) 15 MG tablet Take 15 mg by mouth 2 (two) times daily.     Cholecalciferol (VITAMIN D3) 50 MCG (2000 UT) capsule Take 1 capsule (2,000 Units total) by mouth daily. 90 capsule 2   ferrous sulfate 325 (65 FE) MG EC tablet Take 1 tablet (325 mg total) by mouth daily. 90 tablet 0   fluticasone-salmeterol (ADVAIR) 250-50 MCG/ACT AEPB Inhale 1 puff twice daily, morning & at bedtime 60 each 2   fluvoxaMINE (LUVOX) 25 MG tablet Take 25 mg by mouth 2 (two) times daily.     glimepiride (AMARYL) 2 MG tablet Take 1 tablet (2 mg total) by mouth daily with breakfast. 90 tablet 1   INGREZZA 80 MG capsule Take 80 mg by mouth daily.     pantoprazole (PROTONIX) 40 MG tablet Take  1 capsule by mouth twice daily 60 tablet 5   rosuvastatin (CRESTOR) 20 MG tablet Take 1 tablet (20 mg total) by mouth daily. 90 tablet 2   Suvorexant (BELSOMRA) 20 MG TABS Take by mouth.     tirzepatide (MOUNJARO) 15 MG/0.5ML Pen Inject 15 mg into the skin once a week. 6 mL 2   VENTOLIN HFA 108 (90 Base) MCG/ACT inhaler INHALE 2 PUFFS INTO THE LUNGS EVERY 6 HOURS AS NEEDED FOR WHEEZING OR SHORTNESS OF BREATH 54 g 0   Accu-Chek Softclix Lancets lancets TEST 1 TO 2 TIMES DAILY 100 each 1   Blood Glucose Monitoring Suppl DEVI Test 1-2 times day. Pend on Insurance 1 each 0   glucose blood (ACCU-CHEK GUIDE TEST) test strip TEST 1 TO 2 TIMES DAILY 100 strip 1    lamoTRIgine Starter Kit-Orange 42 x 25 MG & 7 x 100 MG KIT Take by mouth as directed.     Lancet Device MISC 1-2 times daily 1 each 0   Lancets Misc. MISC 1-2 times a day 100 each 0   nitroGLYCERIN (NITROSTAT) 0.4 MG SL tablet Place 0.4 mg under the tongue every 5 (five) minutes as needed for chest pain. Reported on 07/15/2015     No current facility-administered medications on file prior to visit.    Review of Systems As in subjective    Objective:   Physical Exam Due to coronavirus pandemic stay at home measures, patient visit was virtual and they were not examined in person.   Wt 200 lb (90.7 kg)   BMI 34.33 kg/m   General: Well-developed, well-nourished, no acute distress No labored breathing or wheezing Answers questions appropriately    Assessment:     Encounter Diagnoses  Name Primary?   Flu-like symptoms Yes   Fever and chills        Plan:     We discussed symptoms and concerns.  We discussed the high prevalence of fluid in the community currently.  Her symptoms suggest influenza.  Prescription given for Tamiflu, discussed risks/benefits of medication.    Discussed diagnosis of influenza. Discussed supportive care including rest, hydration, OTC Tylenol or NSAID for fever, aches, and malaise.   She can continue over-the-counter remedy for cough and congestion that she days but do not overlap multiple over-the-counter products.  Avoid decongestants as they can raise the blood pressure.  Discussed period of contagion, self quarantine at home away from others to avoid spread of disease, discussed means of transmission, and possible complications including pneumonia.  If worse or not improving within the next 4-5 days, then call or return.  Patient voiced understanding of diagnosis, recommendations, and treatment plan.  I also advised she have her close contacts to call their doctor about getting on Tamiflu prophylactically.   [name] was seen today for flu like  symptoms.  Diagnoses and all orders for this visit:  Flu-like symptoms  Fever and chills  Other orders -     oseltamivir (TAMIFLU) 75 MG capsule; Take 1 capsule (75 mg total) by mouth 2 (two) times daily.  F/u prn

## 2023-04-15 LAB — IRON,TIBC AND FERRITIN PANEL
Ferritin: 93 ng/mL (ref 15–150)
Iron Saturation: 13 % — ABNORMAL LOW (ref 15–55)
Iron: 43 ug/dL (ref 27–159)
Total Iron Binding Capacity: 322 ug/dL (ref 250–450)
UIBC: 279 ug/dL (ref 131–425)

## 2023-04-15 LAB — SPECIMEN STATUS REPORT

## 2023-04-17 ENCOUNTER — Emergency Department (HOSPITAL_COMMUNITY)
Admission: EM | Admit: 2023-04-17 | Discharge: 2023-04-17 | Disposition: A | Discharge status: Home / Self Care | Payer: 59 | Attending: Emergency Medicine | Admitting: Emergency Medicine

## 2023-04-17 ENCOUNTER — Other Ambulatory Visit: Payer: Self-pay

## 2023-04-17 ENCOUNTER — Encounter (HOSPITAL_COMMUNITY): Payer: Self-pay

## 2023-04-17 ENCOUNTER — Emergency Department (HOSPITAL_COMMUNITY): Payer: 59

## 2023-04-17 ENCOUNTER — Telehealth: Payer: Self-pay | Admitting: Medical

## 2023-04-17 DIAGNOSIS — R42 Dizziness and giddiness: Secondary | ICD-10-CM | POA: Diagnosis present

## 2023-04-17 DIAGNOSIS — R0789 Other chest pain: Secondary | ICD-10-CM | POA: Insufficient documentation

## 2023-04-17 DIAGNOSIS — R030 Elevated blood-pressure reading, without diagnosis of hypertension: Secondary | ICD-10-CM | POA: Diagnosis not present

## 2023-04-17 DIAGNOSIS — R052 Subacute cough: Secondary | ICD-10-CM | POA: Diagnosis not present

## 2023-04-17 DIAGNOSIS — I509 Heart failure, unspecified: Secondary | ICD-10-CM | POA: Diagnosis not present

## 2023-04-17 DIAGNOSIS — R519 Headache, unspecified: Secondary | ICD-10-CM | POA: Diagnosis present

## 2023-04-17 LAB — BASIC METABOLIC PANEL
Anion gap: 8 (ref 5–15)
BUN: 15 mg/dL (ref 6–20)
CO2: 25 mmol/L (ref 22–32)
Calcium: 9.5 mg/dL (ref 8.9–10.3)
Chloride: 102 mmol/L (ref 98–111)
Creatinine, Ser: 1.02 mg/dL — ABNORMAL HIGH (ref 0.44–1.00)
GFR, Estimated: >60 mL/min (ref >60)
Glucose, Bld: 146 mg/dL — ABNORMAL HIGH (ref 70–99)
Potassium: 4 mmol/L (ref 3.5–5.1)
Sodium: 135 mmol/L (ref 135–145)

## 2023-04-17 LAB — CBC
HCT: 34.1 % — ABNORMAL LOW (ref 36.0–46.0)
Hemoglobin: 10.8 g/dL — ABNORMAL LOW (ref 12.0–15.0)
MCH: 26.7 pg (ref 26.0–34.0)
MCHC: 31.7 g/dL (ref 30.0–36.0)
MCV: 84.2 fL (ref 80.0–100.0)
Platelets: 341 10*3/uL (ref 150–400)
RBC: 4.05 MIL/uL (ref 3.87–5.11)
RDW: 15.7 % — ABNORMAL HIGH (ref 11.5–15.5)
WBC: 11.1 10*3/uL — ABNORMAL HIGH (ref 4.0–10.5)
nRBC: 0 % (ref 0.0–0.2)

## 2023-04-17 LAB — TROPONIN I (HIGH SENSITIVITY)
Troponin I (High Sensitivity): 6 ng/L (ref <18)
Troponin I (High Sensitivity): 4 ng/L (ref <18)

## 2023-04-17 LAB — RESP PANEL BY RT-PCR (RSV, FLU A&B, COVID)  RVPGX2
Influenza A by PCR: NEGATIVE
Influenza B by PCR: NEGATIVE
Resp Syncytial Virus by PCR: NEGATIVE
SARS Coronavirus 2 by RT PCR: NEGATIVE

## 2023-04-17 LAB — CBG MONITORING, ED: Glucose-Capillary: 94 mg/dL (ref 70–99)

## 2023-04-17 LAB — GROUP A STREP BY PCR: Group A Strep by PCR: NOT DETECTED

## 2023-04-17 MED ORDER — DEXAMETHASONE 4 MG PO TABS: 4.0000 mg | ORAL_TABLET | Freq: Every day | ORAL | 0 refills | Status: DC

## 2023-04-17 MED ORDER — ACETAMINOPHEN 500 MG PO TABS
1000.0000 mg | ORAL_TABLET | Freq: Once | ORAL | Status: AC
  Administered 2023-04-17: 1000 mg via ORAL
  Filled 2023-04-17: qty 2

## 2023-04-17 MED ORDER — DEXAMETHASONE SODIUM PHOSPHATE 10 MG/ML IJ SOLN
10.0000 mg | Freq: Once | INTRAMUSCULAR | Status: AC
Start: 2023-04-17 — End: 2023-04-17
  Administered 2023-04-17: 10 mg via INTRAVENOUS
  Filled 2023-04-17: qty 1

## 2023-04-17 MED ORDER — HYDRALAZINE HCL 20 MG/ML IJ SOLN
20.0000 mg | Freq: Once | INTRAMUSCULAR | Status: AC
Start: 2023-04-17 — End: 2023-04-17
  Administered 2023-04-17: 20 mg via INTRAVENOUS
  Filled 2023-04-17: qty 1

## 2023-04-17 MED ORDER — DOXYCYCLINE HYCLATE 100 MG PO CAPS: 100.0000 mg | ORAL_CAPSULE | Freq: Two times a day (BID) | ORAL | 0 refills | Status: DC

## 2023-04-17 MED ORDER — AMOXICILLIN-POT CLAVULANATE 875-125 MG PO TABS: 1.0000 | ORAL_TABLET | Freq: Two times a day (BID) | ORAL | 0 refills | Status: DC

## 2023-04-17 NOTE — Discharge Instructions (Addendum)
 You were evaluated in the emergency room for chest heaviness and flulike symptoms.  Your lab work did not show any significant abnormality.  Your respiratory panel was negative.  Your chest x-ray does suggest possible pneumonia.  Given persistent symptoms prescription for antibiotics was sent into your pharmacy.  Please be sure to complete the full course of antibiotics.  Please follow-up with your primary care doctor within the next 3 days to discuss your blood pressure management.

## 2023-04-17 NOTE — Telephone Encounter (Signed)
 [name] called and wanted to let you know that they took Cheyenne Alvarez to the hosp due to them not being able to her her BP down She just wanted to inform you

## 2023-04-17 NOTE — Telephone Encounter (Signed)
 Pt daughter left voicemail that she was taken to hospital and they are keeping her due to bp and heaviness in chest

## 2023-04-17 NOTE — ED Triage Notes (Signed)
 Pt arrived reporting she got sent from [facility]. States she has has shob, dizziness, headache, flu like symptoms and blurred vision. Blurred vision has been for the last two days. Reports intermittent hand tingling. Hypertensive in triage and reports she has taken her medications today.

## 2023-04-17 NOTE — ED Provider Triage Note (Signed)
 Emergency Medicine Provider Triage Evaluation Note  Cheyenne Alvarez , a 58 y.o. female  was evaluated in triage.  Pt complains of CP, HA, congestion .  Review of Systems  Positive: HA, CP, cough, congestion  Negative: Abdominal pain   Physical Exam  BP (!) 202/101 (BP Location: Left Arm)   Pulse 97   Temp 98.1 F (36.7 C) (Oral)   Resp 18   SpO2 100%  Gen:   Awake, no distress   Resp:  Normal effort  MSK:   Moves extremities without difficulty  Other: No focal neurological deficits however 2 days of headache and blurred vision different blood pressure is elevated will get patient in room for concern of HTN emergency.   Medical Decision Making  Medically screening exam initiated at 2:56 PM.  Appropriate orders placed.  Cheyenne Alvarez was informed that the remainder of the evaluation will be completed by another provider, this initial triage assessment does not replace that evaluation, and the importance of remaining in the ED until their evaluation is complete.  Patient with upper respiratory tract illness as well as chest pain, blurry vision, headache.  Blood pressure is very elevated in triage.  Will order CT scan no focal neurological deficits.  Concern for hypertensive emergency given symptoms well get patient to room as soon as possible.    Smitty Knudsen, PA-C 04/17/23 1458

## 2023-04-17 NOTE — ED Provider Notes (Signed)
 [facility] EMERGENCY DEPARTMENT AT [facility] Provider Note   CSN: [id number] Arrival date & time: 04/17/23  1353     History  No chief complaint on file.   Cheyenne Alvarez is a 58 y.o. female who presents with flulike symptoms for the past week.  Reports that she started to feel improved but over the past 2 days symptoms worsened again.  She describes chest heaviness.  She does have a history of CHF otherwise no significant cardiac history.  No history of blood clots.  States that yesterday she went to stand up and she briefly had vision floaters and her right hand went numb.  States this was a isolated episode and has not had any recurrent episodes.  Denies any current symptoms no headache, dizziness, blurred vision, difficulty ambulating.  Patient reports she is on numerous blood pressure medications which she has been compliant.  HPI     Home Medications Prior to Admission medications   Medication Sig Start Date End Date Taking? Authorizing Provider  Accu-Chek Softclix Lancets lancets TEST 1 TO 2 TIMES DAILY 03/29/23   Tysinger, Kermit Balo, PA-C  amitriptyline (ELAVIL) 75 MG tablet TAKE 1 TABLET(75 MG) BY MOUTH AT BEDTIME AS NEEDED FOR SLEEP 03/28/23   Fanny Dance, MD  amLODipine (NORVASC) 10 MG tablet Take 1 tablet (10 mg total) by mouth daily. 03/23/23   Tysinger, Kermit Balo, PA-C  amoxicillin-clavulanate (AUGMENTIN) 875-125 MG tablet Take 1 tablet by mouth every 12 (twelve) hours. 04/17/23  Yes Halford Decamp, PA-C  atenolol (TENORMIN) 50 MG tablet Take 1 tablet (50 mg total) by mouth 2 (two) times daily. 03/23/23   Tysinger, Kermit Balo, PA-C  Blood Glucose Monitoring Suppl DEVI Test 1-2 times day. Pend on Insurance 03/28/23   Tysinger, Kermit Balo, PA-C  busPIRone (BUSPAR) 15 MG tablet Take 15 mg by mouth 2 (two) times daily. 03/17/23   [provider]  Cholecalciferol (VITAMIN D3) 50 MCG (2000 UT) capsule Take 1 capsule (2,000 Units total) by mouth daily. 03/23/23    Tysinger, Kermit Balo, PA-C  dexamethasone (DECADRON) 4 MG tablet Take 1 tablet (4 mg total) by mouth daily. 04/17/23  Yes Halford Decamp, PA-C  doxycycline (VIBRAMYCIN) 100 MG capsule Take 1 capsule (100 mg total) by mouth 2 (two) times daily. 04/17/23  Yes Halford Decamp, PA-C  ferrous sulfate 325 (65 FE) MG EC tablet Take 1 tablet (325 mg total) by mouth daily. 03/23/23   Tysinger, Kermit Balo, PA-C  fluticasone-salmeterol (ADVAIR) 250-50 MCG/ACT AEPB Inhale 1 puff twice daily, morning & at bedtime 02/08/23   Tysinger, Kermit Balo, PA-C  fluvoxaMINE (LUVOX) 25 MG tablet Take 25 mg by mouth 2 (two) times daily.    [provider]  glimepiride (AMARYL) 2 MG tablet Take 1 tablet (2 mg total) by mouth daily with breakfast. 03/23/23   Tysinger, Kermit Balo, PA-C  glucose blood (ACCU-CHEK GUIDE TEST) test strip TEST 1 TO 2 TIMES DAILY 03/29/23   Tysinger, Kermit Balo, PA-C  INGREZZA 80 MG capsule Take 80 mg by mouth daily. 05/19/20   [provider]  lamoTRIgine Starter Kit-Orange 42 x 25 MG & 7 x 100 MG KIT Take by mouth as directed. 03/20/23   [provider]  Lancet Device MISC 1-2 times daily 03/28/23   Tysinger, Kermit Balo, PA-C  Lancets Misc. MISC 1-2 times a day 03/28/23   Tysinger, Kermit Balo, PA-C  nitroGLYCERIN (NITROSTAT) 0.4 MG SL tablet Place 0.4 mg under the tongue every  5 (five) minutes as needed for chest pain. Reported on 07/15/2015    [provider]  oseltamivir (TAMIFLU) 75 MG capsule Take 1 capsule (75 mg total) by mouth 2 (two) times daily. 04/13/23   Tysinger, Kermit Balo, PA-C  pantoprazole (PROTONIX) 40 MG tablet Take 1 capsule by mouth twice daily 12/14/22   Tysinger, Kermit Balo, PA-C  rosuvastatin (CRESTOR) 20 MG tablet Take 1 tablet (20 mg total) by mouth daily. 03/23/23   Tysinger, Kermit Balo, PA-C  Suvorexant (BELSOMRA) 20 MG TABS Take by mouth.    [provider]  tirzepatide Greggory Keen) 15 MG/0.5ML Pen Inject 15 mg into the skin once a week. 03/23/23   Tysinger,  Kermit Balo, PA-C  VENTOLIN HFA 108 (90 Base) MCG/ACT inhaler INHALE 2 PUFFS INTO THE LUNGS EVERY 6 HOURS AS NEEDED FOR WHEEZING OR SHORTNESS OF BREATH 03/20/23   Tysinger, Kermit Balo, PA-C      Allergies    Dexilant [dexlansoprazole], Famotidine, Meperidine hcl, Dilaudid [hydromorphone hcl], Gabapentin, Hydrocodone, Ramipril, Ziprasidone hcl, Invokana [canagliflozin], Iodinated contrast media, Iohexol, and Tape    Review of Systems   Review of Systems  Respiratory:  Positive for cough.     Physical Exam Updated Vital Signs BP (!) 206/98 (BP Location: Left Arm)   Pulse (!) 102   Temp 98.3 F (36.8 C) (Oral)   Resp (!) 24   SpO2 100%  Physical Exam Vitals and nursing note reviewed.  Constitutional:      General: She is not in acute distress.    Appearance: She is well-developed.  HENT:     Head: Normocephalic and atraumatic.  Eyes:     Conjunctiva/sclera: Conjunctivae normal.  Cardiovascular:     Rate and Rhythm: Normal rate and regular rhythm.     Heart sounds: No murmur heard. Pulmonary:     Effort: Pulmonary effort is normal. No respiratory distress.     Breath sounds: Normal breath sounds.  Abdominal:     Palpations: Abdomen is soft.     Tenderness: There is no abdominal tenderness.  Musculoskeletal:        General: No swelling.     Cervical back: Neck supple.  Skin:    General: Skin is warm and dry.     Capillary Refill: Capillary refill takes less than 2 seconds.  Neurological:     General: No focal deficit present.     Mental Status: She is alert and oriented to person, place, and time.     Cranial Nerves: No cranial nerve deficit.     Sensory: No sensory deficit.     Motor: No weakness.     Coordination: Coordination normal.     Comments: No facial droop or abnormal phonation, no nystagmus, EOMI, PERRL, finger-to-nose without difficulty  Psychiatric:        Mood and Affect: Mood normal.     ED Results / Procedures / Treatments   Labs (all labs ordered are  listed, but only abnormal results are displayed) Labs Reviewed  BASIC METABOLIC PANEL - Abnormal; Notable for the following components:      Result Value   Glucose, Bld 146 (*)    Creatinine, Ser 1.02 (*)    All other components within normal limits  CBC - Abnormal; Notable for the following components:   WBC 11.1 (*)    Hemoglobin 10.8 (*)    HCT 34.1 (*)    RDW 15.7 (*)    All other components within normal limits  RESP PANEL BY RT-PCR (  RSV, FLU A&B, COVID)  [id number]  GROUP A STREP BY PCR  CBG MONITORING, ED  TROPONIN I (HIGH SENSITIVITY)  TROPONIN I (HIGH SENSITIVITY)    EKG EKG Interpretation Date/Time:  Monday April 17 2023 15:07:56 EST Ventricular Rate:  89 PR Interval:  152 QRS Duration:  80 QT Interval:  344 QTC Calculation: 419 R Axis:   -24  Text Interpretation: Sinus rhythm Borderline left axis deviation No acute changes No significant change since last tracing Confirmed by Derwood Kaplan ([id number]) on 04/17/2023 4:46:10 PM  Radiology CT Head Wo Contrast Result Date: 04/17/2023 CLINICAL DATA:  Headache.  Shortness of breath.  Dizziness. EXAM: CT HEAD WITHOUT CONTRAST TECHNIQUE: Contiguous axial images were obtained from the base of the skull through the vertex without intravenous contrast. RADIATION DOSE REDUCTION: This exam was performed according to the departmental dose-optimization program which includes automated exposure control, adjustment of the mA and/or kV according to patient size and/or use of iterative reconstruction technique. COMPARISON:  12/06/2016 FINDINGS: Brain: The brain shows a normal appearance without evidence of malformation, atrophy, old or acute small or large vessel infarction, mass lesion, hemorrhage, hydrocephalus or extra-axial collection. Vascular: No hyperdense vessel. No evidence of atherosclerotic calcification. Skull: Normal.  No traumatic finding.  No focal bone lesion. Sinuses/Orbits: Sinuses are clear. Orbits appear normal. Mastoids are  clear. Other: None significant IMPRESSION: Normal head CT. Electronically Signed   By: Paulina Fusi M.D.   On: 04/17/2023 17:28   DG Chest 2 View Result Date: 04/17/2023 CLINICAL DATA:  Chest pain.  Cough. EXAM: CHEST - 2 VIEW COMPARISON:  08/07/2019. FINDINGS: Mild diffuse interstitial prominence. It is uncertain whether this represents artifactual "crowding" of the interstitium related to suboptimal inspiration versus pneumonitis/atypical pneumonia. Bilateral lung fields are otherwise clear. No dense consolidation or lung collapse. Bilateral costophrenic angles are clear. Normal cardio-mediastinal silhouette. No acute osseous abnormalities. The soft tissues are within normal limits. IMPRESSION: Mild diffuse interstitial prominence. It is uncertain whether this represents artifactual "crowding" of the interstitium related to suboptimal inspiration versus pneumonitis/atypical pneumonia. Bilateral lung fields are otherwise clear. Electronically Signed   By: Jules Schick M.D.   On: 04/17/2023 17:07    Procedures Procedures    Medications Ordered in ED Medications  hydrALAZINE (APRESOLINE) injection 20 mg (20 mg Intravenous Given 04/17/23 1810)  dexamethasone (DECADRON) injection 10 mg (10 mg Intravenous Given 04/17/23 1809)  acetaminophen (TYLENOL) tablet 1,000 mg (1,000 mg Oral Given 04/17/23 1809)    ED Course/ Medical Decision Making/ A&P Clinical Course as of 04/17/23 1823  Mon Apr 17, 2023  1540 CT Angio Head W or Wo Contrast [JT]    Clinical Course User Index [JT] Halford Decamp, PA-C                                 Medical Decision Making Amount and/or Complexity of Data Reviewed Radiology: ordered. Decision-making details documented in ED Course.  Risk Prescription drug management.   This patient presents to the ED with chief complaint(s) of flulike symptoms, chest pain.  The complaint involves an extensive differential diagnosis and also carries with it a high risk of  complications and morbidity.   pertinent past medical history as listed in HPI  The differential diagnosis includes  ACS, PE, aortic dissection, pneumonia, pneumothorax, myocarditis, pericarditis, musculoskeletal, GERD, esophageal dissection, URI, CVA, TIA, hypertensive emergency/urgency, symptomatic hypertension The initial plan is to  Will start with basic labs, EKG,  chest x-ray Additional history obtained: Additional history obtained from family Records reviewed previous admission documents and Care Everywhere/External Records  Initial Assessment:   Patient presents hypertensive with systolics over 200/100 with complaints of flulike symptoms over the past few days.  Reports that yesterday she did have an episode of brief floaters and right arm numbness when she stood.  This was an isolated episode.  She has not had any recurrent symptoms since.  She denies any headache, blurry vision, dizziness, difficulty ambulating.  Neuro exam without focal deficits.  Her lung sounds are clear.  Primarily complains of chest heaviness.  She does have a history of CHF, otherwise no other cardiac history.  No history of blood clots.  Troponins are within normal limits, EKG sinus rhythm.  Independent ECG interpretation:  Sinus rhythm without ischemic changes  Independent labs interpretation:  The following labs were independently interpreted:  Troponins without elevation, respiratory panel/strep negative, BMP without significant abnormality, CBC with mild leukocytosis of 11.1  Independent visualization and interpretation of imaging: I independently visualized the following imaging with scope of interpretation limited to determining acute life threatening conditions related to emergency care: Chest x-ray, which revealed mild diffuse interstitial prominence, may represent atypical pneumonia, CT head without acute findings  Treatment and Reassessment: Patient given Decadron 10 mg, Tylenol 1000 mg and  hydralazine 20 mg following first assessment  Upon reassessment patient reports that she is ready to go home.  Discussed discharge planning she is agreeable.  Consultations obtained:   none  Disposition:   Patient will be discharged home on course of augmentin and Doxy with short course of steroids.  Encouraged follow-up with primary care doctor within the next 3 days to discuss blood pressure management. The patient has been appropriately medically screened and/or stabilized in the ED. I have low suspicion for any other emergent medical condition which would require further screening, evaluation or treatment in the ED or require inpatient management. At time of discharge the patient is hemodynamically stable and in no acute distress. I have discussed work-up results and diagnosis with patient and answered all questions. Patient is agreeable with discharge plan. We discussed strict return precautions for returning to the emergency department and they verbalized understanding.    Social Determinants of Health:   none  This note was dictated with voice recognition software.  Despite best efforts at proofreading, errors may have occurred which can change the documentation meaning.          Final Clinical Impression(s) / ED Diagnoses Final diagnoses:  Subacute cough  Elevated blood pressure reading    Rx / DC Orders ED Discharge Orders          Ordered    doxycycline (VIBRAMYCIN) 100 MG capsule  2 times daily        04/17/23 1822    dexamethasone (DECADRON) 4 MG tablet  Daily        04/17/23 1822    amoxicillin-clavulanate (AUGMENTIN) 875-125 MG tablet  Every 12 hours        04/17/23 1822              Fabienne Bruns 04/17/23 Vita Erm, MD 04/18/23 [phone]

## 2023-04-18 ENCOUNTER — Telehealth: Payer: Self-pay

## 2023-04-18 NOTE — Transitions of Care (Post Inpatient/ED Visit) (Signed)
   04/18/2023  Name: Cheyenne Alvarez MRN: [id number] DOB: September 22, 1965  Today's TOC FU Call Status: Today's TOC FU Call Status:: Unsuccessful Call (1st Attempt) Unsuccessful Call (1st Attempt) Date: 04/18/23  Attempted to reach the patient regarding the most recent Inpatient/ED visit.  Follow Up Plan: Additional outreach attempts will be made to reach the patient to complete the Transitions of Care (Post Inpatient/ED visit) call.   Signature Karena Addison, LPN [facility] Nurse Health Advisor Direct Dial [phone]

## 2023-04-18 NOTE — Transitions of Care (Post Inpatient/ED Visit) (Signed)
 04/18/2023  Name: Cheyenne Alvarez MRN: [id number] DOB: 29-Jan-1966  Today's TOC FU Call Status: Today's TOC FU Call Status:: Successful TOC FU Call Completed Unsuccessful Call (1st Attempt) Date: 04/18/23 [facility] FU Call Complete Date: 04/18/23 Patient's Name and Date of Birth confirmed.  Transition Care Management Follow-up Telephone Call Date of Discharge: 04/17/23 Discharge Facility: [facility]) Type of Discharge: Emergency Department Reason for ED Visit: Other: (cough) How have you been since you were released from the hospital?: Same Any questions or concerns?: No  Items Reviewed: Did you receive and understand the discharge instructions provided?: Yes Medications obtained,verified, and reconciled?: Yes (Medications Reviewed) Any new allergies since your discharge?: No Dietary orders reviewed?: NA Do you have support at home?: No  Medications Reviewed Today: Medications Reviewed Today     Reviewed by Karena Addison, LPN (Licensed Practical Nurse) on 04/18/23 at 1643  Med List Status: <None>   Medication Order Taking? Sig Documenting Provider Last Dose Status Informant  Accu-Chek Softclix Lancets lancets [id number]  TEST 1 TO 2 TIMES DAILY Jac Canavan, PA-C  Active   amitriptyline (ELAVIL) 75 MG tablet [id number] No TAKE 1 TABLET(75 MG) BY MOUTH AT BEDTIME AS NEEDED FOR SLEEP Fanny Dance, MD Taking Active   amLODipine (NORVASC) 10 MG tablet [id number] No Take 1 tablet (10 mg total) by mouth daily. Tysinger, Kermit Balo, PA-C Taking Active   amoxicillin-clavulanate (AUGMENTIN) 875-125 MG tablet [id number]  Take 1 tablet by mouth every 12 (twelve) hours. Halford Decamp, PA-C  Active   atenolol (TENORMIN) 50 MG tablet [id number] No Take 1 tablet (50 mg total) by mouth 2 (two) times daily. Jac Canavan, PA-C Taking Active   Blood Glucose Monitoring Suppl DEVI [id number]  Test 1-2 times day. Pend on [employer], Cleda Mccreedy  Active   busPIRone (BUSPAR)  15 MG tablet [id number] No Take 15 mg by mouth 2 (two) times daily. [provider] Taking Active   Cholecalciferol (VITAMIN D3) 50 MCG (2000 UT) capsule [id number] No Take 1 capsule (2,000 Units total) by mouth daily. Tysinger, Kermit Balo, PA-C Taking Active   dexamethasone (DECADRON) 4 MG tablet [id number]  Take 1 tablet (4 mg total) by mouth daily. Halford Decamp, PA-C  Active   doxycycline (VIBRAMYCIN) 100 MG capsule [id number]  Take 1 capsule (100 mg total) by mouth 2 (two) times daily. Halford Decamp, PA-C  Active   ferrous sulfate 325 (65 FE) MG EC tablet [id number] No Take 1 tablet (325 mg total) by mouth daily. Tysinger, Kermit Balo, PA-C Taking Active   fluticasone-salmeterol (ADVAIR) 250-50 MCG/ACT AEPB [id number] No Inhale 1 puff twice daily, morning & at bedtime Jac Canavan, PA-C Taking Active   fluvoxaMINE (LUVOX) 25 MG tablet [id number] No Take 25 mg by mouth 2 (two) times daily. [provider] Taking Active Self           Med Note Antony Madura, Arn Medal   Tue Dec 06, 2016  7:29 PM)    glimepiride (AMARYL) 2 MG tablet [id number] No Take 1 tablet (2 mg total) by mouth daily with breakfast. Jac Canavan, PA-C Taking Active   glucose blood (ACCU-CHEK GUIDE TEST) test strip [id number]  TEST 1 TO 2 TIMES DAILY Genia Del  Active   INGREZZA 80 MG capsule [id number] No Take 80 mg by mouth daily. [provider] Taking Active   lamoTRIgine Starter Kit-Orange 42 x 25 MG & 7 x 100 MG KIT [id number]  Take by mouth  as directed. [provider]  Active   Lancet Device MISC [id number]  1-2 times daily Tysinger, Kermit Balo, PA-C  Active   Lancets Misc. MISC [id number]  1-2 times a day Genia Del  Active   nitroGLYCERIN (NITROSTAT) 0.4 MG SL tablet [id number] No Place 0.4 mg under the tongue every 5 (five) minutes as needed for chest pain. Reported on 07/15/2015 [provider] Taking Active Self           Med Note Joella Prince A   Tue Apr 07, 2015  4:39 PM)    oseltamivir (TAMIFLU) 75 MG capsule [id number]  Take 1 capsule (75 mg total) by mouth 2 (two) times daily. Tysinger, Kermit Balo, PA-C  Active   pantoprazole (PROTONIX) 40 MG tablet [id number] No Take 1 capsule by mouth twice daily Tysinger, Kermit Balo, PA-C Taking Active   rosuvastatin (CRESTOR) 20 MG tablet [id number] No Take 1 tablet (20 mg total) by mouth daily. Tysinger, Kermit Balo, PA-C Taking Active   Suvorexant (BELSOMRA) 20 MG TABS [id number] No Take by mouth. [provider] Taking Active Self  tirzepatide [facility]) 15 MG/0.5ML Pen [id number] No Inject 15 mg into the skin once a week. Jac Canavan, PA-C Taking Active   VENTOLIN HFA 108 (90 Base) MCG/ACT inhaler [id number] No INHALE 2 PUFFS INTO THE LUNGS EVERY 6 HOURS AS NEEDED FOR WHEEZING OR SHORTNESS OF BREATH Tysinger, Kermit Balo, PA-C Taking Active             Home Care and Equipment/Supplies: Were Home Health Services Ordered?: NA Any new equipment or medical supplies ordered?: NA  Functional Questionnaire: Do you need assistance with bathing/showering or dressing?: No Do you need assistance with meal preparation?: No Do you need assistance with eating?: No Do you have difficulty maintaining continence: No Do you need assistance with getting out of bed/getting out of a chair/moving?: No Do you have difficulty managing or taking your medications?: No  Follow up appointments reviewed: PCP Follow-up appointment confirmed?: Yes Date of PCP follow-up appointment?: 04/21/23 Follow-up Provider: [facility] Follow-up appointment confirmed?: NA Do you need transportation to your follow-up appointment?: No Do you understand care options if your condition(s) worsen?: Yes-patient verbalized understanding    SIGNATURE Karena Addison, LPN [facility] Nurse Health Advisor Direct Dial [phone]

## 2023-04-21 ENCOUNTER — Ambulatory Visit (INDEPENDENT_AMBULATORY_CARE_PROVIDER_SITE_OTHER): Payer: 59 | Admitting: Medical

## 2023-04-21 VITALS — BP 160/90 | HR 74 | Temp 97.0°F | Resp 16 | Wt 204.0 lb

## 2023-04-21 DIAGNOSIS — Z794 Long term (current) use of insulin: Secondary | ICD-10-CM

## 2023-04-21 DIAGNOSIS — I10 Essential (primary) hypertension: Secondary | ICD-10-CM

## 2023-04-21 DIAGNOSIS — D509 Iron deficiency anemia, unspecified: Secondary | ICD-10-CM | POA: Diagnosis not present

## 2023-04-21 DIAGNOSIS — R058 Other specified cough: Secondary | ICD-10-CM

## 2023-04-21 DIAGNOSIS — E1165 Type 2 diabetes mellitus with hyperglycemia: Secondary | ICD-10-CM

## 2023-04-21 DIAGNOSIS — R030 Elevated blood-pressure reading, without diagnosis of hypertension: Secondary | ICD-10-CM

## 2023-04-21 NOTE — Progress Notes (Signed)
 Subjective:  Cheyenne Alvarez is a 58 y.o. female who presents for Chief Complaint  Patient presents with   Cough    Cough and congestion. Negative for covid and flu on 2/17 at hospital. Xray was abnormal.      Here for emergency department follow-up.  Here with her female friend  She was seen February 17 at urgent care but was transferred to the emergency department for quite elevated blood pressure, respiratory tract symptoms but also complaints of hand numbness and ataxia.  She has been having cough and congestion.  She was negative for COVID and flu at the hospital.  She had a chest x-ray.  She is some improved but still having some congestion, having some trouble breathing, still quite achy.   Regarding BP, using Amlodipine 10mg  daily, Atenolol 50mg  BID, and she notes Spironolactone 3 mo per Dr. Sharyn Lull, cardiology.  No other aggravating or relieving factors.    No other c/o.  Past Medical History:  Diagnosis Date   Alopecia    Anemia    Anxiety    Bipolar 1 disorder (HCC)    COPD (chronic obstructive pulmonary disease) (HCC)    Coronary artery disease    Depression    Diabetes mellitus 2010   Gastritis    GERD (gastroesophageal reflux disease)    Headache    migraines   Hypertension 2010   Obesity    Pneumonia    PONV (postoperative nausea and vomiting)    PTSD (post-traumatic stress disorder)    Schizophrenia (HCC)    Tobacco use    Current Outpatient Medications on File Prior to Visit  Medication Sig Dispense Refill   Accu-Chek Softclix Lancets lancets TEST 1 TO 2 TIMES DAILY 100 each 1   amitriptyline (ELAVIL) 75 MG tablet TAKE 1 TABLET(75 MG) BY MOUTH AT BEDTIME AS NEEDED FOR SLEEP 30 tablet 3   amLODipine (NORVASC) 10 MG tablet Take 1 tablet (10 mg total) by mouth daily. 90 tablet 2   amoxicillin-clavulanate (AUGMENTIN) 875-125 MG tablet Take 1 tablet by mouth every 12 (twelve) hours. 14 tablet 0   atenolol (TENORMIN) 50 MG tablet Take 1 tablet (50 mg  total) by mouth 2 (two) times daily. 180 tablet 1   Blood Glucose Monitoring Suppl DEVI Test 1-2 times day. Pend on Insurance 1 each 0   busPIRone (BUSPAR) 15 MG tablet Take 15 mg by mouth 2 (two) times daily.     Cholecalciferol (VITAMIN D3) 50 MCG (2000 UT) capsule Take 1 capsule (2,000 Units total) by mouth daily. 90 capsule 2   dexamethasone (DECADRON) 4 MG tablet Take 1 tablet (4 mg total) by mouth daily. 4 tablet 0   doxycycline (VIBRAMYCIN) 100 MG capsule Take 1 capsule (100 mg total) by mouth 2 (two) times daily. 10 capsule 0   ferrous sulfate 325 (65 FE) MG EC tablet Take 1 tablet (325 mg total) by mouth daily. 90 tablet 0   fluticasone-salmeterol (ADVAIR) 250-50 MCG/ACT AEPB Inhale 1 puff twice daily, morning & at bedtime 60 each 2   fluvoxaMINE (LUVOX) 25 MG tablet Take 25 mg by mouth 2 (two) times daily.     glimepiride (AMARYL) 2 MG tablet Take 1 tablet (2 mg total) by mouth daily with breakfast. 90 tablet 1   glucose blood (ACCU-CHEK GUIDE TEST) test strip TEST 1 TO 2 TIMES DAILY 100 strip 1   INGREZZA 80 MG capsule Take 80 mg by mouth daily.     lamoTRIgine Starter Kit-Orange 42 x 25  MG & 7 x 100 MG KIT Take by mouth as directed.     Lancet Device MISC 1-2 times daily 1 each 0   Lancets Misc. MISC 1-2 times a day 100 each 0   nitroGLYCERIN (NITROSTAT) 0.4 MG SL tablet Place 0.4 mg under the tongue every 5 (five) minutes as needed for chest pain. Reported on 07/15/2015     pantoprazole (PROTONIX) 40 MG tablet Take 1 capsule by mouth twice daily 60 tablet 5   rosuvastatin (CRESTOR) 20 MG tablet Take 1 tablet (20 mg total) by mouth daily. 90 tablet 2   Suvorexant (BELSOMRA) 20 MG TABS Take by mouth.     tirzepatide (MOUNJARO) 15 MG/0.5ML Pen Inject 15 mg into the skin once a week. 6 mL 2   VENTOLIN HFA 108 (90 Base) MCG/ACT inhaler INHALE 2 PUFFS INTO THE LUNGS EVERY 6 HOURS AS NEEDED FOR WHEEZING OR SHORTNESS OF BREATH 54 g 0   No current facility-administered medications on file  prior to visit.    The following portions of the patient's history were reviewed and updated as appropriate: allergies, current medications, past family history, past medical history, past social history, past surgical history and problem list.  ROS Otherwise as in subjective above    Objective: BP (!) 160/90 (Cuff Size: Large)   Pulse 74   Temp (!) 97 F (36.1 C)   Resp 16   Wt 204 lb (92.5 kg)   SpO2 100%   BMI 35.02 kg/m   BP Readings from Last 3 Encounters:  04/21/23 (!) 160/90  04/17/23 (!) 206/98  03/23/23 122/82    Wt Readings from Last 3 Encounters:  04/21/23 204 lb (92.5 kg)  04/13/23 200 lb (90.7 kg)  03/23/23 202 lb 12.8 oz (92 kg)   General appearance: alert, no distress, well developed, well nourished HEENT: normocephalic, sclerae anicteric, conjunctiva pink and moist, TMs pearly, nares patent, no discharge or erythema, pharynx normal Oral cavity: dry MM, no lesions Neck: supple, no lymphadenopathy, no thyromegaly, no masses Heart: RRR, normal S1, S2, no murmurs Lungs: CTA bilaterally, no wheezes, rhonchi, or rales Pulses: 2+ radial pulses, 2+ pedal pulses, normal cap refill Ext: no edema    Assessment: Encounter Diagnoses  Name Primary?   Cough productive of purulent sputum Yes   Essential hypertension    Elevated blood pressure reading    Iron deficiency anemia, unspecified iron deficiency anemia type    Uncontrolled type 2 diabetes mellitus with hyperglycemia (HCC)    Type 2 diabetes mellitus with hyperglycemia, with long-term current use of insulin (HCC)      Plan: I reviewed back over the emergency department notes.  She was seen April 17, 2023.  She went in with flulike symptoms for a week, chest heaviness and even had an episode of hand numbness and blurred vision difficulty walking.  At the emergency department blood pressure was quite high, she was little bit tachypneic.  She had an evaluation including EKG, labs.  She was given  medication in the emergency department including hydralazine for blood pressure, dexamethasone, Tylenol.  She is discharged with doxycycline, dexamethasone and Augmentin  Clinically much improved.  Blood pressure not at goal   Recommendations Your lungs sound okay today, but you appear dehydrated Finish out the doxycycline antibiotic and the Augmentin antibiotic Finish out the prednisone called dexamethasone Increase your water intake.  Try to drink at least 100 ounces of water daily Continue your Advair lung breathing medicine twice daily for maintenance You can  use your rescue inhaler Ventolin/albuterol every 4-6 hours Regarding your elevated blood pressure, follow-up with Dr. Sharyn Lull.  I was not aware that you are on spironolactone.  That is a fluid pill.  Continue amlodipine and atenolol as you have been doing but see Dr. Sharyn Lull in the next 2 weeks if your blood pressures are still staying too high Talk to psychiatrist about coming down on the amitriptyline dose Regarding diabetes, continue Mounjaro 15 mg weekly, continue glimepiride 2 mg daily Follow-up in 2 months regarding diabetes and blood sugar unless your numbers are not staying consistently between 80 and 130 fasting     Srihitha was seen today for cough.  Diagnoses and all orders for this visit:  Cough productive of purulent sputum  Essential hypertension  Elevated blood pressure reading  Iron deficiency anemia, unspecified iron deficiency anemia type  Uncontrolled type 2 diabetes mellitus with hyperglycemia (HCC)  Type 2 diabetes mellitus with hyperglycemia, with long-term current use of insulin (HCC)    Follow up: 21mo

## 2023-04-21 NOTE — Patient Instructions (Signed)
 Recommendations Your lungs sound okay today, but you appear dehydrated Finish out the doxycycline antibiotic and the Augmentin antibiotic Finish out the prednisone called dexamethasone Increase your water intake.  Try to drink at least 100 ounces of water daily Continue your Advair lung breathing medicine twice daily for maintenance You can use your rescue inhaler Ventolin/albuterol every 4-6 hours Regarding your elevated blood pressure, follow-up with Dr. Sharyn Lull.  I was not aware that you are on spironolactone.  That is a fluid pill.  Continue amlodipine and atenolol as you have been doing but see Dr. Sharyn Lull in the next 2 weeks if your blood pressures are still staying too high Talk to psychiatrist about coming down on the amitriptyline dose Regarding diabetes, continue Mounjaro 15 mg weekly, continue glimepiride 2 mg daily Follow-up in 2 months regarding diabetes and blood sugar unless your numbers are not staying consistently between 80 and 130 fasting

## 2023-05-12 ENCOUNTER — Other Ambulatory Visit (HOSPITAL_COMMUNITY): Payer: Self-pay

## 2023-05-13 ENCOUNTER — Other Ambulatory Visit: Payer: Self-pay | Admitting: Medical

## 2023-05-19 ENCOUNTER — Ambulatory Visit: Admitting: Medical

## 2023-05-19 VITALS — BP 122/70 | HR 52 | Wt 202.2 lb

## 2023-05-19 DIAGNOSIS — F319 Bipolar disorder, unspecified: Secondary | ICD-10-CM | POA: Diagnosis not present

## 2023-05-19 DIAGNOSIS — F411 Generalized anxiety disorder: Secondary | ICD-10-CM

## 2023-05-19 DIAGNOSIS — E1165 Type 2 diabetes mellitus with hyperglycemia: Secondary | ICD-10-CM | POA: Diagnosis not present

## 2023-05-19 DIAGNOSIS — Z794 Long term (current) use of insulin: Secondary | ICD-10-CM

## 2023-05-19 DIAGNOSIS — F250 Schizoaffective disorder, bipolar type: Secondary | ICD-10-CM

## 2023-05-19 NOTE — Progress Notes (Signed)
 Subjective: Chief Complaint  Patient presents with   Consult    Consult- needs letter stating that she can pay own bills by herself.     Current Health Care Team: Dr. Dorisann Frames, endocrinology Dr. Raelyn Mora, CNM with gynecology Dr. Sharyn Lull, cardiology Dr. Kallie Locks, NP, psychiatry, [employer] Dr. Charlott Rakes, GI Dentist No eye doctor   Here for concerns about having a letter to attest to her ability to handle her own affairs.   She notes that she needs letter stating she can handle her own affairs, handle her own bills.     For years her eldest [name] has been power of attorney , helping Shantika handle her finances, income, bills, etc.   Megean notes that she realized that she was at a point in her life where she could not handle these things.  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  For period time she was on medication and not able to take care of her parents.  Her daughter has looked after all these years helping make sure her bills are paid and that she has things that she needs.  However Lisett notes that it is time for her to live her life again and regain control over her own affairs.  She states that she is ready to handle her own finances, her own paycheck, her own bills and day-to-day activities.  She feels like she has received healing.  She feels like her current medicines are doing quite well.  She notes that her daughter called in a few days ago to our office asking about memory concerns and possible evaluation.  However, Ellasyn feels like she can handle her own things now and does not feel she is in the same sickness and mental states she was in the past.   She has 4 children, [name] is oldest child.  She states that she got a letter from [employer] stating that she would need her doctor to sign off on her mental state and mental capabilities.  No other aggravating or  relieving factors. No other complaint.   Past Medical History:  Diagnosis Date   Alopecia    Anemia    Anxiety    Bipolar 1 disorder (HCC)    COPD (chronic obstructive pulmonary disease) (HCC)    Coronary artery disease    Depression    Diabetes mellitus 2010   Gastritis    GERD (gastroesophageal reflux disease)    Headache    migraines   Hypertension 2010   Obesity    Pneumonia    PONV (postoperative nausea and vomiting)    PTSD (post-traumatic stress disorder)    Schizophrenia (HCC)    Tobacco use    Current Outpatient Medications on File Prior to Visit  Medication Sig Dispense Refill   Accu-Chek Softclix Lancets lancets TEST 1 TO 2 TIMES DAILY 100 each 1   amitriptyline (ELAVIL) 75 MG tablet TAKE 1 TABLET(75 MG) BY MOUTH AT BEDTIME AS NEEDED FOR SLEEP 30 tablet 3   amLODipine (NORVASC) 10 MG tablet Take 1 tablet by mouth every day 30 tablet 1   atenolol (TENORMIN) 50 MG tablet Take 1 tablet by mouth twice daily 60 tablet 1   Blood Glucose Monitoring Suppl DEVI Test 1-2 times day. Pend on Insurance 1 each 0   busPIRone (BUSPAR) 15 MG tablet Take 15 mg by mouth 2 (two) times daily.  Cholecalciferol (VITAMIN D3) 50 MCG (2000 UT) capsule Take 1 softgel by mouth every day 30 capsule 1   ferrous sulfate 325 (65 FE) MG EC tablet Take 1 tablet (325 mg total) by mouth daily. 90 tablet 0   fluticasone-salmeterol (ADVAIR) 250-50 MCG/ACT AEPB Inhale 1 puff twice daily, morning & at bedtime 60 each 1   fluvoxaMINE (LUVOX) 25 MG tablet Take 25 mg by mouth 2 (two) times daily.     glimepiride (AMARYL) 2 MG tablet Take 1 tablet (2 mg total) by mouth daily with breakfast. 90 tablet 1   glucose blood (ACCU-CHEK GUIDE TEST) test strip TEST 1 TO 2 TIMES DAILY 100 strip 1   INGREZZA 80 MG capsule Take 80 mg by mouth daily.     lamoTRIgine Starter Kit-Orange 42 x 25 MG & 7 x 100 MG KIT Take by mouth as directed.     Lancet Device MISC 1-2 times daily 1 each 0   Lancets Misc. MISC 1-2 times  a day 100 each 0   nitroGLYCERIN (NITROSTAT) 0.4 MG SL tablet Place 0.4 mg under the tongue every 5 (five) minutes as needed for chest pain. Reported on 07/15/2015     pantoprazole (PROTONIX) 40 MG tablet Take 1 capsule by mouth twice daily 60 tablet 5   rosuvastatin (CRESTOR) 20 MG tablet Take 1 tablet by mouth every day 30 tablet 1   Suvorexant (BELSOMRA) 20 MG TABS Take by mouth.     tirzepatide (MOUNJARO) 15 MG/0.5ML Pen Inject 15 mg into the skin once a week. 6 mL 2   VENTOLIN HFA 108 (90 Base) MCG/ACT inhaler INHALE 2 PUFFS INTO THE LUNGS EVERY 6 HOURS AS NEEDED FOR WHEEZING OR SHORTNESS OF BREATH 54 g 0   No current facility-administered medications on file prior to visit.   ROS as in subjective    Objective: BP 122/70   Pulse (!) 52   Wt 202 lb 3.2 oz (91.7 kg)   BMI 34.71 kg/m   Gen: wd, wn ,nad Psych: Pleasant, answers questions appropriately     Assessment: Encounter Diagnoses  Name Primary?   Bipolar affective disorder, remission status unspecified (HCC) Yes   Type 2 diabetes mellitus with hyperglycemia, with long-term current use of insulin (HCC)    Schizoaffective disorder, bipolar type (HCC)    Generalized anxiety disorder      Plan: We discussed her concerns.  I advised Ms. Hendrie that her psychiatrist really should be the person to make a statement or on her capabilities in this regard.  I advised I cannot in good faith state whether she is fully capable of handling all of her affairs including her finances.  I have noted that in the last year particular in the last 6 months she has seen more coherent, more clearheaded with her decision making and responses in our visits.  I think her current medication regimen seems to be doing a lot better compared to other regimens she has been on in the past.  She has not seen a neurologist.  We discussed that she could do a formal evaluation with neurology  I reiterated that her daughter has kept up with her affairs  for years and done well for her.  We discussed that if she had a major depression episode of manic episodes that that was certainly impact her ability to take care of herself overall.  We also discussed that she had a medical illness such as pneumonia or stroke or otherwise, that would also potentially  impact her ability to take care of herself.  I also discussed the power of attorney issues or legal issues and we cannot really fully make that evaluation on her behalf but her psychiatrist may be able to.  She will see her psychiatrist as a next step.    Cathye was seen today for consult.  Diagnoses and all orders for this visit:  Bipolar affective disorder, remission status unspecified (HCC)  Type 2 diabetes mellitus with hyperglycemia, with long-term current use of insulin (HCC)  Schizoaffective disorder, bipolar type (HCC)  Generalized anxiety disorder    F/u prn

## 2023-05-26 ENCOUNTER — Telehealth: Payer: Self-pay | Admitting: *Deleted

## 2023-05-26 NOTE — Telephone Encounter (Signed)
 Called patient back, she does not want to see a neuro. She states she simply wants her daughter off her "paperwork" and nothing more.

## 2023-05-26 NOTE — Telephone Encounter (Signed)
 Copied from CRM #[id number]. Topic: Clinical - Medication Question >> May 26, 2023  8:47 AM Alessandra Bevels wrote: Reason for CRM: Patient is calling to report that she went to see her psychiatrist yesterday. Advised that Vincenza Hews had be trying to get in touch with her about going to see a doctor for her head. Patient states that was not discussed at the appointment with [facility]. Please advise

## 2023-05-29 NOTE — Telephone Encounter (Signed)
 Referral canceled

## 2023-06-07 ENCOUNTER — Other Ambulatory Visit: Payer: Self-pay | Admitting: Medical

## 2023-06-12 ENCOUNTER — Other Ambulatory Visit: Payer: Self-pay | Admitting: Medical

## 2023-06-15 ENCOUNTER — Other Ambulatory Visit: Payer: Self-pay | Admitting: Medical

## 2023-06-15 DIAGNOSIS — Z0000 Encounter for general adult medical examination without abnormal findings: Secondary | ICD-10-CM

## 2023-06-16 ENCOUNTER — Ambulatory Visit
Admission: RE | Admit: 2023-06-16 | Discharge: 2023-06-16 | Disposition: A | Discharge status: Home / Self Care | Source: Ambulatory Visit | Attending: Medical | Admitting: Medical

## 2023-06-16 DIAGNOSIS — Z0000 Encounter for general adult medical examination without abnormal findings: Secondary | ICD-10-CM

## 2023-06-20 ENCOUNTER — Ambulatory Visit: Payer: 59 | Admitting: Medical

## 2023-06-28 ENCOUNTER — Encounter: Payer: Self-pay | Admitting: Podiatry

## 2023-06-28 ENCOUNTER — Ambulatory Visit (INDEPENDENT_AMBULATORY_CARE_PROVIDER_SITE_OTHER): Admitting: Podiatry

## 2023-06-28 DIAGNOSIS — B353 Tinea pedis: Secondary | ICD-10-CM | POA: Diagnosis not present

## 2023-06-28 MED ORDER — CLOTRIMAZOLE-BETAMETHASONE 1-0.05 % EX CREA: 1.0000 | TOPICAL_CREAM | Freq: Every day | CUTANEOUS | 3 refills | Status: DC

## 2023-06-28 NOTE — Progress Notes (Signed)
 Chief Complaint  Patient presents with   Callouses    RM#9 bilateral foot calluses dry skin on feet concerned about skin cracking on bottom of feet.Patient is a diabetic and has concerns of skin issues.    Subjective: 58 y.o. female PMHx diabetes mellitus presenting to the office today to the office for follow-up evaluation of symptomatic calluses to the bilateral feet.  Patient also states that she has developed a skin lesion to the plantar aspect of the left foot that is very painful and symptomatic.  She does admit to walking around the house barefoot.     Past Medical History:  Diagnosis Date   Alopecia    Anemia    Anxiety    Bipolar 1 disorder (HCC)    COPD (chronic obstructive pulmonary disease) (HCC)    Coronary artery disease    Depression    Diabetes mellitus 2010   Gastritis    GERD (gastroesophageal reflux disease)    Headache    migraines   Hypertension 2010   Obesity    Pneumonia    PONV (postoperative nausea and vomiting)    PTSD (post-traumatic stress disorder)    Schizophrenia (HCC)    Tobacco use     Past Surgical History:  Procedure Laterality Date   ABDOMINAL HYSTERECTOMY     heavy bleeding   BREAST BIOPSY Right 07/21/2022   MM RT BREAST BX W LOC DEV 1ST LESION IMAGE BX SPEC STEREO GUIDE 07/21/2022 [facility]   CARDIAC CATHETERIZATION     CHOLECYSTECTOMY     COLONOSCOPY  05/14/14   hemorrhoids, otherwise normal; Dr. Baldo Bonds   COLONOSCOPY WITH PROPOFOL  N/A 05/14/2014   Procedure: COLONOSCOPY WITH PROPOFOL ;  Surgeon: Baldo Bonds, MD;  Location: [facility] ENDOSCOPY;  Service: Endoscopy;  Laterality: N/A;   ESOPHAGOGASTRODUODENOSCOPY (EGD) WITH PROPOFOL  N/A 05/14/2014   Procedure: ESOPHAGOGASTRODUODENOSCOPY (EGD) WITH PROPOFOL ;  Surgeon: Baldo Bonds, MD;  Location: [facility] ENDOSCOPY;  Service: Endoscopy;  Laterality: N/A;   FRACTURE SURGERY     left leg   LEG SURGERY     TUBAL LIGATION      Allergies  Allergen Reactions   Dexilant  [Dexlansoprazole] Shortness Of Breath, Diarrhea, Nausea And Vomiting and Other (See Comments)    Chest pain and abdominal pain (also)   Famotidine Anaphylaxis   Meperidine Hcl Anaphylaxis   Dilaudid  [Hydromorphone  Hcl] Other (See Comments)    Change in mental state   Gabapentin  Other (See Comments)    Memory loss   Hydrocodone  Nausea And Vomiting   Ramipril Swelling and Other (See Comments)    Angioedema    Ziprasidone Hcl Other (See Comments)    Caused convulsions   Invokana [Canagliflozin] Rash   Iodinated Contrast Media Itching and Rash    Other Reaction(s): Other (See Comments)   Iohexol  Itching and Rash   Tape Rash    Use paper tape only     Objective:  Physical Exam General: Alert and oriented x3 in no acute distress  Dermatology: Hyperkeratotic lesion(s) present on the bilateral feet. Pain on palpation with a central nucleated core noted to the left plantar arch. Skin is warm, dry and supple bilateral lower extremities. Negative for open lesions or macerations.  Vascular: Palpable pedal pulses bilaterally. No edema or erythema noted. Capillary refill within normal limits.  Neurological: Light touch and protective threshold grossly intact bilaterally.   Musculoskeletal Exam: Pain on palpation at the keratotic lesion(s) noted. Range of motion within normal limits bilateral. Muscle strength 5/5 in all groups  bilateral.  Assessment: 1.  Symptomatic calluses bilateral feet 2.  Tinea pedis bilateral feet  3.  Diabetes mellitus   Plan of Care:  -Patient evaluated -Excisional debridement of keratoic lesion(s) using a chisel blade was performed without incident.   -Prescription for Lotrisone cream apply twice daily -Refrain from going barefoot or wearing socks only.  Recommended socks and tennis shoes for the next month around the house to see if this improves the condition of her feet -Continue management with PCP for diabetes -Return to clinic 1 month  Dot Gazella,  DPM [facility]  Dr. Dot Gazella, DPM    [address] [place], [place] [address]                Office [phone]  Fax [phone]

## 2023-07-09 ENCOUNTER — Other Ambulatory Visit: Payer: Self-pay | Admitting: Medical

## 2023-07-25 ENCOUNTER — Telehealth: Payer: Self-pay | Admitting: Medical

## 2023-07-25 ENCOUNTER — Other Ambulatory Visit: Payer: Self-pay | Admitting: Medical

## 2023-07-25 MED ORDER — FLUTICASONE-SALMETEROL 250-50 MCG/ACT IN AEPB: INHALATION_SPRAY | RESPIRATORY_TRACT | 5 refills | Status: DC

## 2023-07-25 MED ORDER — VITAMIN D3 50 MCG (2000 UT) PO CAPS: 2000.0000 [IU] | ORAL_CAPSULE | Freq: Every day | ORAL | 0 refills | Status: DC

## 2023-07-25 NOTE — Telephone Encounter (Signed)
 Refill request   [email or web address]  Fluticasone    Vitamin D # 200

## 2023-07-31 ENCOUNTER — Ambulatory Visit: Admitting: Podiatry

## 2023-08-26 ENCOUNTER — Other Ambulatory Visit: Payer: Self-pay | Admitting: Medical

## 2023-09-06 ENCOUNTER — Ambulatory Visit (INDEPENDENT_AMBULATORY_CARE_PROVIDER_SITE_OTHER): Admitting: Medical

## 2023-09-06 VITALS — BP 130/88 | HR 68 | Temp 97.7°F | Wt 205.8 lb

## 2023-09-06 DIAGNOSIS — R1013 Epigastric pain: Secondary | ICD-10-CM | POA: Diagnosis not present

## 2023-09-06 DIAGNOSIS — Z8639 Personal history of other endocrine, nutritional and metabolic disease: Secondary | ICD-10-CM

## 2023-09-06 DIAGNOSIS — Z79899 Other long term (current) drug therapy: Secondary | ICD-10-CM | POA: Diagnosis not present

## 2023-09-06 DIAGNOSIS — Z87898 Personal history of other specified conditions: Secondary | ICD-10-CM | POA: Diagnosis not present

## 2023-09-06 MED ORDER — SUCRALFATE 1 G PO TABS
1.0000 g | ORAL_TABLET | Freq: Three times a day (TID) | ORAL | 0 refills | Status: DC
Start: 2023-09-06 — End: 2023-10-12

## 2023-09-06 MED ORDER — TRAMADOL HCL 50 MG PO TABS: 50.0000 mg | ORAL_TABLET | Freq: Two times a day (BID) | ORAL | 0 refills | Status: AC | PRN

## 2023-09-06 NOTE — Progress Notes (Signed)
 Subjective:  Cheyenne Alvarez is a 58 y.o. female who presents for Chief Complaint  Patient presents with   Acute Visit    Stomach pain x 3 days, feels like knife in stomach with burning. Pain in legs for a week and losing her balance. Had an appointment with cardiology today and BP was elevated.      Current Health Care Team: Dr. Littie Caffey, endocrinology Dr. Ala Cart, CNM with gynecology Dr. Levern, cardiology Dr. Linnie, psychiatry Dr. Jerrell Sol, GI Dentist  Here for stomach pain, ulcers acting up x 3 days.  Is on Protonix  BID, and since 3 days ago using pepto bismol BID.   Has had ulcers in past, and this feels the same.  Quite bad pain, wants something for pain.  Has had some black stool since starting pepto bismol.    Saw cardiology today, BP not controlled.   Spironolactone was added.   She has been feeling worse with balance, bumping into walls, no slurred speech, confusion, no new numbness tingling or weakness.  No vision changes.  Compliant with medicaiton in general.   Has ongoing leg pains.     No other aggravating or relieving factors.    No other c/o.  Past Medical History:  Diagnosis Date   Alopecia    Anemia    Anxiety    Bipolar 1 disorder (HCC)    COPD (chronic obstructive pulmonary disease) (HCC)    Coronary artery disease    Depression    Diabetes mellitus 2010   Gastritis    GERD (gastroesophageal reflux disease)    Headache    migraines   Hypertension 2010   Obesity    Pneumonia    PONV (postoperative nausea and vomiting)    PTSD (post-traumatic stress disorder)    Schizophrenia (HCC)    Tobacco use    Current Outpatient Medications on File Prior to Visit  Medication Sig Dispense Refill   amitriptyline  (ELAVIL ) 75 MG tablet TAKE 1 TABLET(75 MG) BY MOUTH AT BEDTIME AS NEEDED FOR SLEEP 30 tablet 3   amLODipine  (NORVASC ) 10 MG tablet Take 1 tablet by mouth every day 30 tablet 2   atenolol  (TENORMIN ) 50 MG tablet Take 1  tablet by mouth twice daily 60 tablet 2   busPIRone (BUSPAR) 15 MG tablet Take 15 mg by mouth 2 (two) times daily.     Cholecalciferol (VITAMIN D3) 50 MCG (2000 UT) capsule Take 1 capsule (2,000 Units total) by mouth daily. 90 capsule 0   ferrous sulfate  325 (65 FE) MG EC tablet Take 1 tablet (325 mg total) by mouth daily. 90 tablet 0   fluticasone -salmeterol (ADVAIR) 250-50 MCG/ACT AEPB Inhale 1 puff twice daily, morning & at bedtime 60 each 5   fluvoxaMINE (LUVOX) 25 MG tablet Take 25 mg by mouth 2 (two) times daily.     glimepiride  (AMARYL ) 2 MG tablet Take 1 tablet (2 mg total) by mouth daily with breakfast. 90 tablet 1   INGREZZA 80 MG capsule Take 80 mg by mouth daily.     pantoprazole  (PROTONIX ) 40 MG tablet Take 1 capsule by mouth twice daily 60 tablet 11   rosuvastatin  (CRESTOR ) 20 MG tablet Take 1 tablet by mouth every day 30 tablet 2   Suvorexant (BELSOMRA) 20 MG TABS Take by mouth.     tirzepatide  (MOUNJARO ) 15 MG/0.5ML Pen Inject 15 mg into the skin once a week. 6 mL 2   VENTOLIN  HFA 108 (90 Base) MCG/ACT inhaler INHALE 2 PUFFS INTO  THE LUNGS EVERY 6 HOURS AS NEEDED FOR WHEEZING OR SHORTNESS OF BREATH 54 g 0   Accu-Chek Softclix Lancets lancets TEST 1 TO 2 TIMES DAILY 100 each 1   Blood Glucose Monitoring Suppl DEVI Test 1-2 times day. Pend on Insurance 1 each 0   clotrimazole -betamethasone  (LOTRISONE ) cream Apply 1 Application topically daily. 45 g 3   glucose blood (ACCU-CHEK GUIDE TEST) test strip TEST 1 TO 2 TIMES DAILY 100 strip 1   lamoTRIgine Starter Kit-Orange 42 x 25 MG & 7 x 100 MG KIT Take by mouth as directed.     Lancet Device MISC 1-2 times daily 1 each 0   Lancets Misc. MISC 1-2 times a day 100 each 0   nitroGLYCERIN (NITROSTAT) 0.4 MG SL tablet Place 0.4 mg under the tongue every 5 (five) minutes as needed for chest pain. Reported on 07/15/2015     No current facility-administered medications on file prior to visit.     The following portions of the patient's  history were reviewed and updated as appropriate: allergies, current medications, past family history, past medical history, past social history, past surgical history and problem list.  ROS Otherwise as in subjective above  Objective: BP 130/88   Pulse 68   Temp 97.7 F (36.5 C)   Wt 205 lb 12.8 oz (93.4 kg)   SpO2 98%   BMI 35.33 kg/m   General appearance: alert, no distress, well developed, well nourished Abdomen: +bs, soft, +epigastric tenderness, otherwise non tender, non distended, no masses, no hepatomegaly, no splenomegaly Pulses: 2+ radial pulses, 2+ pedal pulses, normal cap refill Ext: no edema    Assessment: Encounter Diagnoses  Name Primary?   Epigastric pain Yes   History of ulcer disease    High risk medication use    History of iron deficiency      Plan: Discussed symptoms, possible causes.  She notes hx/o ulcer.  She has quite a bit of epigastric tendnerss. She is on mounjaro  high dose which could also aggravated GERD.   Continue Protonix  BID, and for next 1-2 weeks, use Pepto Bismol BID OTC.  Begin sucralfate  as discussed.  Avoid trigger foods.  Ultram  short term for pain, caution on sedation  Referral back to GI  Hx/o iron deficiency. She is still taking iron daily.  Updated labs today for CBC and iron.  Susi was seen today for acute visit.  Diagnoses and all orders for this visit:  Epigastric pain -     Ambulatory referral to Gastroenterology -     CBC with Differential/Platelet -     Iron, TIBC and Ferritin Panel  History of ulcer disease -     Ambulatory referral to Gastroenterology -     CBC with Differential/Platelet -     Iron, TIBC and Ferritin Panel  High risk medication use  History of iron deficiency -     CBC with Differential/Platelet -     Iron, TIBC and Ferritin Panel  Other orders -     sucralfate  (CARAFATE ) 1 g tablet; Take 1 tablet (1 g total) by mouth 4 (four) times daily -  with meals and at bedtime. -     traMADol   (ULTRAM ) 50 MG tablet; Take 1 tablet (50 mg total) by mouth 2 (two) times daily as needed for up to 5 days.    Follow up: pending labs, referral

## 2023-09-07 ENCOUNTER — Telehealth: Payer: Self-pay

## 2023-09-07 ENCOUNTER — Other Ambulatory Visit: Payer: Self-pay | Admitting: Medical

## 2023-09-07 ENCOUNTER — Ambulatory Visit: Payer: Self-pay | Admitting: Medical

## 2023-09-07 LAB — CBC WITH DIFFERENTIAL/PLATELET
Basophils Absolute: 0 x10E3/uL (ref 0.0–0.2)
Basos: 0 %
EOS (ABSOLUTE): 0 x10E3/uL (ref 0.0–0.4)
Eos: 0 %
Hematocrit: 34 % (ref 34.0–46.6)
Hemoglobin: 10.7 g/dL — ABNORMAL LOW (ref 11.1–15.9)
Immature Grans (Abs): 0 x10E3/uL (ref 0.0–0.1)
Immature Granulocytes: 0 %
Lymphocytes Absolute: 3.4 x10E3/uL — ABNORMAL HIGH (ref 0.7–3.1)
Lymphs: 31 %
MCH: 27 pg (ref 26.6–33.0)
MCHC: 31.5 g/dL (ref 31.5–35.7)
MCV: 86 fL (ref 79–97)
Monocytes Absolute: 0.7 x10E3/uL (ref 0.1–0.9)
Monocytes: 6 %
Neutrophils Absolute: 6.8 x10E3/uL (ref 1.4–7.0)
Neutrophils: 63 %
Platelets: 338 x10E3/uL (ref 150–450)
RBC: 3.96 x10E6/uL (ref 3.77–5.28)
RDW: 14.2 % (ref 11.7–15.4)
WBC: 10.9 x10E3/uL — ABNORMAL HIGH (ref 3.4–10.8)

## 2023-09-07 LAB — IRON,TIBC AND FERRITIN PANEL
Ferritin: 89 ng/mL (ref 15–150)
Iron Saturation: 10 — AB (ref 15–55)
Iron: 33 ug/dL (ref 27–159)
Total Iron Binding Capacity: 320 ug/dL (ref 250–450)
UIBC: 287 ug/dL (ref 131–425)

## 2023-09-07 MED ORDER — VENTOLIN HFA 108 (90 BASE) MCG/ACT IN AERS: 2.0000 | INHALATION_SPRAY | Freq: Four times a day (QID) | RESPIRATORY_TRACT | 0 refills | Status: DC | PRN

## 2023-09-07 MED ORDER — ROSUVASTATIN CALCIUM 20 MG PO TABS: 20.0000 mg | ORAL_TABLET | Freq: Every day | ORAL | 1 refills | Status: DC

## 2023-09-07 MED ORDER — TIRZEPATIDE 15 MG/0.5ML ~~LOC~~ SOAJ: 15.0000 mg | SUBCUTANEOUS | 1 refills | Status: DC

## 2023-09-07 MED ORDER — FERROUS SULFATE 325 (65 FE) MG PO TBEC: 325.0000 mg | DELAYED_RELEASE_TABLET | Freq: Every day | ORAL | 1 refills | Status: DC

## 2023-09-07 MED ORDER — GLIMEPIRIDE 2 MG PO TABS: 2.0000 mg | ORAL_TABLET | Freq: Every day | ORAL | 1 refills | Status: DC

## 2023-09-07 MED ORDER — AMLODIPINE BESYLATE 10 MG PO TABS
10.0000 mg | ORAL_TABLET | Freq: Every day | ORAL | 1 refills | Status: DC
Start: 2023-09-07 — End: 2023-12-18

## 2023-09-07 NOTE — Progress Notes (Signed)
 Blood counts are stable.  Your hemoglobin is showing anemia but stable.  Iron still not where it needs to be.  Expect phone call about referral back to gastroenterology  Lets see if the sucralfate  new medication yesterday helps with the belly pain  Continue oral iron and also recommend doing an iron infusion at the infusion center over the next month.  If agreeable we will work to set this up

## 2023-09-07 NOTE — Telephone Encounter (Signed)
 Dr. Bulah and Samule, patient will be scheduled as soon as possible.  Auth Submission: NO AUTH NEEDED Site of care: Site of care: [facility] Payer: [employer] medicare dual complete Medication & CPT/J Code(s) submitted: Venofer (Iron Sucrose) J1756 Diagnosis Code:  Route of submission (phone, fax, portal):  Phone # Fax # Auth type: Buy/Bill PB Units/visits requested: 200mg  x 5 doses Reference number:  Approval from: 09/07/23 to 01/08/24

## 2023-09-11 ENCOUNTER — Ambulatory Visit

## 2023-09-11 VITALS — BP 160/82 | HR 62 | Temp 98.2°F | Resp 18 | Ht 64.0 in | Wt 202.6 lb

## 2023-09-11 DIAGNOSIS — D509 Iron deficiency anemia, unspecified: Secondary | ICD-10-CM

## 2023-09-11 MED ORDER — IRON SUCROSE 20 MG/ML IV SOLN
200.0000 mg | Freq: Once | INTRAVENOUS | Status: AC
  Administered 2023-09-11: 200 mg via INTRAVENOUS
  Filled 2023-09-11: qty 10

## 2023-09-11 NOTE — Progress Notes (Signed)
 Diagnosis: Acute Anemia  Provider:  Chilton Greathouse MD  Procedure: IV Push  IV Type: Peripheral, IV Location: L Antecubital  Venofer (Iron Sucrose), Dose: 200 mg  Post Infusion IV Care: Observation period completed and Peripheral IV Discontinued  Discharge: Condition: Good, Destination: Home . AVS Declined  Performed by:  Nat Math, RN

## 2023-09-13 ENCOUNTER — Ambulatory Visit

## 2023-09-13 VITALS — BP 129/82 | HR 63 | Temp 98.1°F | Resp 18 | Ht 64.0 in | Wt 206.0 lb

## 2023-09-13 DIAGNOSIS — D509 Iron deficiency anemia, unspecified: Secondary | ICD-10-CM

## 2023-09-13 MED ORDER — SODIUM CHLORIDE 0.9 % IV BOLUS
250.0000 mL | Freq: Once | INTRAVENOUS | Status: DC
  Filled 2023-09-13: qty 250

## 2023-09-13 MED ORDER — IRON SUCROSE 20 MG/ML IV SOLN
200.0000 mg | Freq: Once | INTRAVENOUS | Status: AC
  Administered 2023-09-13: 200 mg via INTRAVENOUS
  Filled 2023-09-13: qty 10

## 2023-09-13 NOTE — Progress Notes (Signed)
 Diagnosis: Iron Deficiency Anemia  Provider:  Chilton Greathouse MD  Procedure: IV Push  IV Type: Peripheral, IV Location: L Antecubital  Venofer (Iron Sucrose), Dose: 200 mg  Post Infusion IV Care: Observation period completed and Peripheral IV Discontinued  Discharge: Condition: Stable, Destination: Home . AVS Declined  Performed by:  Wyvonne Lenz, RN

## 2023-09-15 ENCOUNTER — Ambulatory Visit

## 2023-09-15 VITALS — BP 173/85 | HR 72 | Temp 98.0°F | Resp 16 | Ht 64.0 in | Wt 203.8 lb

## 2023-09-15 DIAGNOSIS — D509 Iron deficiency anemia, unspecified: Secondary | ICD-10-CM

## 2023-09-15 MED ORDER — SODIUM CHLORIDE 0.9 % IV BOLUS
250.0000 mL | Freq: Once | INTRAVENOUS | Status: DC
  Filled 2023-09-15: qty 250

## 2023-09-15 MED ORDER — IRON SUCROSE 20 MG/ML IV SOLN
200.0000 mg | Freq: Once | INTRAVENOUS | Status: AC
  Administered 2023-09-15: 200 mg via INTRAVENOUS
  Filled 2023-09-15: qty 10

## 2023-09-15 NOTE — Progress Notes (Signed)
 Diagnosis: Iron  Deficiency Anemia  Provider:  Praveen Mannam MD  Procedure: IV Push  IV Type: Peripheral, IV Location: L Antecubital  Venofer  (Iron  Sucrose), Dose: 200 mg  Post Infusion IV Care: Observation period completed  Discharge: Condition: Good, Destination: Home . AVS Declined  Performed by:  Rachelle Bue, RN

## 2023-09-18 ENCOUNTER — Ambulatory Visit

## 2023-09-18 VITALS — BP 167/88 | HR 70 | Temp 98.0°F | Resp 18 | Ht 63.0 in | Wt 204.4 lb

## 2023-09-18 DIAGNOSIS — D509 Iron deficiency anemia, unspecified: Secondary | ICD-10-CM | POA: Diagnosis not present

## 2023-09-18 MED ORDER — SODIUM CHLORIDE 0.9 % IV BOLUS
250.0000 mL | Freq: Once | INTRAVENOUS | Status: DC
  Filled 2023-09-18: qty 250

## 2023-09-18 MED ORDER — IRON SUCROSE 20 MG/ML IV SOLN
200.0000 mg | Freq: Once | INTRAVENOUS | Status: AC
Start: 2023-09-18 — End: 2023-09-18
  Administered 2023-09-18: 200 mg via INTRAVENOUS
  Filled 2023-09-18: qty 10

## 2023-09-18 NOTE — Progress Notes (Signed)
 Diagnosis: Iron Deficiency Anemia  Provider:  Chilton Greathouse MD  Procedure: IV Push  IV Type: Peripheral, IV Location: L Antecubital  Venofer (Iron Sucrose), Dose: 200 mg  Post Infusion IV Care: Patient declined observation and Peripheral IV Discontinued  Discharge: Condition: Good, Destination: Home . AVS Declined  Performed by:  Rico Ala, LPN

## 2023-09-20 ENCOUNTER — Telehealth: Payer: Self-pay | Admitting: Internal Medicine

## 2023-09-20 ENCOUNTER — Ambulatory Visit

## 2023-09-20 ENCOUNTER — Other Ambulatory Visit: Payer: Self-pay | Admitting: Medical

## 2023-09-20 VITALS — BP 150/81 | HR 63 | Temp 98.1°F | Resp 16 | Ht 63.0 in | Wt 204.4 lb

## 2023-09-20 DIAGNOSIS — D509 Iron deficiency anemia, unspecified: Secondary | ICD-10-CM

## 2023-09-20 MED ORDER — TRAMADOL HCL 50 MG PO TABS: 50.0000 mg | ORAL_TABLET | Freq: Two times a day (BID) | ORAL | 0 refills | Status: DC

## 2023-09-20 MED ORDER — SODIUM CHLORIDE 0.9 % IV BOLUS
250.0000 mL | Freq: Once | INTRAVENOUS | Status: DC
  Filled 2023-09-20: qty 250

## 2023-09-20 MED ORDER — IRON SUCROSE 20 MG/ML IV SOLN
200.0000 mg | Freq: Once | INTRAVENOUS | Status: AC
  Administered 2023-09-20: 200 mg via INTRAVENOUS
  Filled 2023-09-20: qty 10

## 2023-09-20 NOTE — Telephone Encounter (Signed)
 Copied from CRM [id number]. Topic: Clinical - Medical Advice >> Sep 20, 2023 10:52 AM Cheyenne Alvarez wrote: Reason for CRM: patient is calling in because she is having an procedure done on 10/11/23 and is requesting for more traMADol  (ULTRAM ) 50 MG tablet. Please contact patient regarding medication.

## 2023-09-20 NOTE — Progress Notes (Signed)
 Diagnosis: Iron  Deficiency Anemia  Provider:  Mannam, Praveen MD  Procedure: IV Push  IV Type: Peripheral, IV Location: L Antecubital  Venofer  (Iron  Sucrose), Dose: 200 mg  Post Infusion IV Care: Patient declined observation and Peripheral IV Discontinued  Discharge: Condition: Good, Destination: Home . AVS Declined  Performed by:  Lendel Quant, RN

## 2023-09-22 ENCOUNTER — Other Ambulatory Visit: Payer: Self-pay | Admitting: Medical

## 2023-09-25 NOTE — Telephone Encounter (Signed)
 This was sent in on 7/23 by shane

## 2023-09-26 ENCOUNTER — Telehealth: Payer: Self-pay | Admitting: Internal Medicine

## 2023-09-26 NOTE — Telephone Encounter (Signed)
 Left message for pt to call back.  Per Ludie- Hemoglobin is showing anemia but stable. Iron  still not where it needs to be. she is continue oral iron  and also recommend doing an iron  infusion at the infusion center over the next month to help get iron  level up. Looks like she did iron  infusions on 09/11/23, 09/13/23, 09/15/23, 09/18/23,09/20/23.   Trying to find out what she is confused about her labs and iron  infusions-    Copied from CRM [id number]. Topic: Clinical - Lab/Test Results >> Sep 26, 2023  9:18 AM Berwyn MATSU wrote: Reason for CRM:  Patient called in to ask to go over labs and she is confused on the iron  infusion. Patient also would like to advise that she will have endoscopy and a colonoscopy on the same day 10/11/23. Patient is requesting a calla back.   May you please assist.

## 2023-09-27 ENCOUNTER — Other Ambulatory Visit: Payer: Self-pay | Admitting: Medical

## 2023-09-27 MED ORDER — TRAMADOL HCL ER 100 MG PO TB24: 100.0000 mg | ORAL_TABLET | Freq: Two times a day (BID) | ORAL | 0 refills | Status: DC

## 2023-09-27 MED ORDER — FERROUS SULFATE 325 (65 FE) MG PO TBEC: 325.0000 mg | DELAYED_RELEASE_TABLET | Freq: Every day | ORAL | 1 refills | Status: DC

## 2023-09-27 NOTE — Telephone Encounter (Signed)
 Spoke with patient about her iron  and resent in iron  medication to local pharmacy as rx was sent to mail order on 7/10.    Spoke to patient and she is scheduled for colonoscopy and Endoscopy on 8/13 but in the meantime she would like something stronger than the Tramadol  as its not working. He has been in the bed for 2 days as her pain is severe. She is not able to take Ibuprofen . Can you prescribe something for her pain to local pharmacy

## 2023-09-28 ENCOUNTER — Telehealth: Payer: Self-pay

## 2023-09-28 ENCOUNTER — Other Ambulatory Visit (HOSPITAL_COMMUNITY): Payer: Self-pay

## 2023-09-28 NOTE — Telephone Encounter (Signed)
 Spoke to patient and she says that [facility] needs approval so her meds can be covered   Copied from CRM [id number]. Topic: Clinical - Prescription Issue >> Sep 28, 2023 12:08 PM Gustabo D wrote: The pharmacy is trying to charge her she said to please send it to [employer] - Saying she needs it so they will cover the cost ferrous sulfate  325 (65 FE) MG EC tablet traMADol  (ULTRAM -ER) 100 MG 24 hr tablet Patient says she's in a lot of pain.

## 2023-09-28 NOTE — Telephone Encounter (Signed)
 Pharmacy Patient Advocate Encounter   Received notification from CoverMyMeds that prior authorization for traMADol  HCl ER 100MG  er tablets is required/requested.   Insurance verification completed.   The patient is insured through [facility] MEDICARE PARTD .   Per test claim: PA required; PA submitted to above mentioned insurance via CoverMyMeds Key/confirmation #/EOC  (Key: [id number])     Status is pending

## 2023-09-28 NOTE — Telephone Encounter (Signed)
 Pharmacy Patient Advocate Encounter           Received notification from Pt Calls Messages that prior authorization for ferrous sulfate  325 (65 FE) MG EC tablet  is required/requested.   Insurance verification completed.   The patient is insured through [facility] .   Per test claim: OTC Medications are not covered under PartD Law

## 2023-09-29 ENCOUNTER — Other Ambulatory Visit: Payer: Self-pay | Admitting: Medical

## 2023-09-29 MED ORDER — ACETAMINOPHEN-CODEINE 300-30 MG PO TABS: 1.0000 | ORAL_TABLET | Freq: Two times a day (BID) | ORAL | 0 refills | Status: DC | PRN

## 2023-09-29 NOTE — Telephone Encounter (Signed)
 Pharmacy Patient Advocate Encounter  Received notification from [employer] that Prior Authorization for TRAMADOL  has been DENIED.  Full denial letter will be uploaded to the media tab. See denial reason below.     Insurance will only/continue to cover up to 30tablets a month without A Prior Authorization needed. Anything that exceeds that amount will need to be prescribed by a Pain Specialist.         PA #/Case ID/Reference #: Key: [id number]

## 2023-09-29 NOTE — Telephone Encounter (Signed)
 Patient was notified.

## 2023-10-07 ENCOUNTER — Other Ambulatory Visit: Payer: Self-pay | Admitting: Medical

## 2023-10-10 ENCOUNTER — Other Ambulatory Visit: Payer: Self-pay | Admitting: Medical

## 2023-10-10 NOTE — Telephone Encounter (Signed)
 Copied from CRM [id number]. Topic: Clinical - Medication Refill >> Oct 10, 2023  3:57 PM Charlet HERO wrote: Medication: acetaminophen -codeine  (TYLENOL  #3) 300-30 MG tablet  Has the patient contacted their pharmacy? Yes Informed pcp would need to call in , patient is stating she had surgery tomorrow says she is in pain now.  This is the patient's preferred pharmacy:    [phone] - [place], [place] - [address] AT [facility] [address] [place] [address] Phone: [phone] Fax: [phone]  Is this the correct pharmacy for this prescription? Yes If no, delete pharmacy and type the correct one.   Has the prescription been filled recently? Yes  Is the patient out of the medication? Yes  Has the patient been seen for an appointment in the last year OR does the patient have an upcoming appointment? Yes  Can we respond through MyChart? Yes  Agent: Please be advised that Rx refills may take up to 3 business days. We ask that you follow-up with your pharmacy.

## 2023-10-11 ENCOUNTER — Encounter (HOSPITAL_BASED_OUTPATIENT_CLINIC_OR_DEPARTMENT_OTHER): Payer: Self-pay

## 2023-10-11 ENCOUNTER — Observation Stay (HOSPITAL_COMMUNITY)

## 2023-10-11 ENCOUNTER — Other Ambulatory Visit: Payer: Self-pay

## 2023-10-11 ENCOUNTER — Observation Stay (HOSPITAL_BASED_OUTPATIENT_CLINIC_OR_DEPARTMENT_OTHER)
Admission: EM | Admit: 2023-10-11 | Discharge: 2023-10-12 | Disposition: A | Discharge status: Home / Self Care | Attending: Gastroenterology | Admitting: Gastroenterology

## 2023-10-11 DIAGNOSIS — R100 Acute abdomen: Secondary | ICD-10-CM | POA: Diagnosis not present

## 2023-10-11 DIAGNOSIS — E1165 Type 2 diabetes mellitus with hyperglycemia: Secondary | ICD-10-CM | POA: Diagnosis present

## 2023-10-11 DIAGNOSIS — Z79899 Other long term (current) drug therapy: Secondary | ICD-10-CM | POA: Insufficient documentation

## 2023-10-11 DIAGNOSIS — D649 Anemia, unspecified: Secondary | ICD-10-CM | POA: Insufficient documentation

## 2023-10-11 DIAGNOSIS — J449 Chronic obstructive pulmonary disease, unspecified: Secondary | ICD-10-CM | POA: Diagnosis present

## 2023-10-11 DIAGNOSIS — F17210 Nicotine dependence, cigarettes, uncomplicated: Secondary | ICD-10-CM | POA: Insufficient documentation

## 2023-10-11 DIAGNOSIS — I2510 Atherosclerotic heart disease of native coronary artery without angina pectoris: Secondary | ICD-10-CM | POA: Diagnosis not present

## 2023-10-11 DIAGNOSIS — F411 Generalized anxiety disorder: Secondary | ICD-10-CM | POA: Diagnosis present

## 2023-10-11 DIAGNOSIS — I10 Essential (primary) hypertension: Secondary | ICD-10-CM | POA: Diagnosis not present

## 2023-10-11 DIAGNOSIS — K922 Gastrointestinal hemorrhage, unspecified: Secondary | ICD-10-CM | POA: Diagnosis present

## 2023-10-11 DIAGNOSIS — F250 Schizoaffective disorder, bipolar type: Secondary | ICD-10-CM | POA: Diagnosis present

## 2023-10-11 DIAGNOSIS — K5900 Constipation, unspecified: Secondary | ICD-10-CM | POA: Diagnosis not present

## 2023-10-11 DIAGNOSIS — R109 Unspecified abdominal pain: Secondary | ICD-10-CM | POA: Diagnosis present

## 2023-10-11 DIAGNOSIS — F319 Bipolar disorder, unspecified: Secondary | ICD-10-CM | POA: Diagnosis present

## 2023-10-11 DIAGNOSIS — K219 Gastro-esophageal reflux disease without esophagitis: Secondary | ICD-10-CM | POA: Diagnosis present

## 2023-10-11 DIAGNOSIS — K920 Hematemesis: Secondary | ICD-10-CM | POA: Diagnosis present

## 2023-10-11 DIAGNOSIS — E1169 Type 2 diabetes mellitus with other specified complication: Secondary | ICD-10-CM | POA: Diagnosis present

## 2023-10-11 DIAGNOSIS — K2900 Acute gastritis without bleeding: Secondary | ICD-10-CM

## 2023-10-11 DIAGNOSIS — Z743 Need for continuous supervision: Secondary | ICD-10-CM | POA: Diagnosis not present

## 2023-10-11 LAB — CBC WITH DIFFERENTIAL/PLATELET
Abs Immature Granulocytes: 0.05 K/uL (ref 0.00–0.07)
Basophils Absolute: 0 K/uL (ref 0.0–0.1)
Basophils Relative: 0 %
Eosinophils Absolute: 0.1 K/uL (ref 0.0–0.5)
Eosinophils Relative: 1 %
HCT: 36.6 % (ref 36.0–46.0)
Hemoglobin: 12 g/dL (ref 12.0–15.0)
Immature Granulocytes: 0 %
Lymphocytes Relative: 30 %
Lymphs Abs: 3.8 K/uL (ref 0.7–4.0)
MCH: 27.4 pg (ref 26.0–34.0)
MCHC: 32.8 g/dL (ref 30.0–36.0)
MCV: 83.6 fL (ref 80.0–100.0)
Monocytes Absolute: 0.9 K/uL (ref 0.1–1.0)
Monocytes Relative: 7 %
Neutro Abs: 8 K/uL — ABNORMAL HIGH (ref 1.7–7.7)
Neutrophils Relative %: 62 %
Platelets: 288 K/uL (ref 150–400)
RBC: 4.38 MIL/uL (ref 3.87–5.11)
RDW: 15.5 % (ref 11.5–15.5)
WBC: 12.9 K/uL — ABNORMAL HIGH (ref 4.0–10.5)
nRBC: 0 % (ref 0.0–0.2)

## 2023-10-11 LAB — GLUCOSE, CAPILLARY
Glucose-Capillary: 101 mg/dL — ABNORMAL HIGH (ref 70–99)
Glucose-Capillary: 102 mg/dL — ABNORMAL HIGH (ref 70–99)
Glucose-Capillary: 107 mg/dL — ABNORMAL HIGH (ref 70–99)
Glucose-Capillary: 200 mg/dL — ABNORMAL HIGH (ref 70–99)
Glucose-Capillary: 93 mg/dL (ref 70–99)

## 2023-10-11 LAB — COMPREHENSIVE METABOLIC PANEL WITH GFR
ALT: 36 U/L (ref 0–44)
AST: 30 U/L (ref 15–41)
Albumin: 3.9 g/dL (ref 3.5–5.0)
Alkaline Phosphatase: 139 U/L — ABNORMAL HIGH (ref 38–126)
Anion gap: 12 (ref 5–15)
BUN: 14 mg/dL (ref 6–20)
CO2: 25 mmol/L (ref 22–32)
Calcium: 9.4 mg/dL (ref 8.9–10.3)
Chloride: 102 mmol/L (ref 98–111)
Creatinine, Ser: 1.27 mg/dL — ABNORMAL HIGH (ref 0.44–1.00)
GFR, Estimated: 48 mL/min — ABNORMAL LOW (ref >60)
Glucose, Bld: 142 mg/dL — ABNORMAL HIGH (ref 70–99)
Potassium: 3.6 mmol/L (ref 3.5–5.1)
Sodium: 139 mmol/L (ref 135–145)
Total Bilirubin: <0.2 mg/dL (ref 0.0–1.2)
Total Protein: 7.3 g/dL (ref 6.5–8.1)

## 2023-10-11 LAB — HEMOGLOBIN AND HEMATOCRIT, BLOOD
HCT: 34 % — ABNORMAL LOW (ref 36.0–46.0)
HCT: 35 % — ABNORMAL LOW (ref 36.0–46.0)
HCT: 35.5 % — ABNORMAL LOW (ref 36.0–46.0)
Hemoglobin: 10.8 g/dL — ABNORMAL LOW (ref 12.0–15.0)
Hemoglobin: 10.8 g/dL — ABNORMAL LOW (ref 12.0–15.0)
Hemoglobin: 11.3 g/dL — ABNORMAL LOW (ref 12.0–15.0)

## 2023-10-11 LAB — OCCULT BLOOD X 1 CARD TO LAB, STOOL: Fecal Occult Bld: NEGATIVE

## 2023-10-11 LAB — TROPONIN T, HIGH SENSITIVITY
Troponin T High Sensitivity: <15 ng/L (ref 0–19)
Troponin T High Sensitivity: <15 ng/L (ref 0–19)

## 2023-10-11 LAB — HEMOGLOBIN A1C
Hgb A1c MFr Bld: 6.8 % — ABNORMAL HIGH (ref 4.8–5.6)
Mean Plasma Glucose: 148 mg/dL

## 2023-10-11 MED ORDER — PEG 3350-KCL-NA BICARB-NACL 420 G PO SOLR: 2000.0000 mL | Freq: Once | ORAL | Status: DC

## 2023-10-11 MED ORDER — CALCIUM CARBONATE ANTACID 500 MG PO CHEW
1.0000 | CHEWABLE_TABLET | Freq: Three times a day (TID) | ORAL | Status: DC
  Administered 2023-10-11 (×2): 200 mg via ORAL
  Filled 2023-10-11: qty 1

## 2023-10-11 MED ORDER — ESCITALOPRAM OXALATE 20 MG PO TABS
10.0000 mg | ORAL_TABLET | Freq: Every day | ORAL | Status: DC
  Administered 2023-10-11 – 2023-10-12 (×3): 10 mg via ORAL
  Filled 2023-10-11 (×2): qty 1

## 2023-10-11 MED ORDER — PANTOPRAZOLE SODIUM 40 MG IV SOLR
80.0000 mg | Freq: Once | INTRAVENOUS | Status: AC
  Administered 2023-10-11 (×2): 80 mg via INTRAVENOUS
  Filled 2023-10-11: qty 20

## 2023-10-11 MED ORDER — ROSUVASTATIN CALCIUM 20 MG PO TABS
20.0000 mg | ORAL_TABLET | Freq: Every day | ORAL | Status: DC
  Administered 2023-10-11 – 2023-10-12 (×3): 20 mg via ORAL
  Filled 2023-10-11 (×2): qty 1

## 2023-10-11 MED ORDER — ALUM & MAG HYDROXIDE-SIMETH 200-200-20 MG/5ML PO SUSP
30.0000 mL | Freq: Once | ORAL | Status: AC
  Administered 2023-10-11 (×2): 30 mL via ORAL
  Filled 2023-10-11: qty 30

## 2023-10-11 MED ORDER — HYDRALAZINE HCL 20 MG/ML IJ SOLN: 5.0000 mg | Freq: Four times a day (QID) | INTRAMUSCULAR | Status: DC | PRN

## 2023-10-11 MED ORDER — SPIRONOLACTONE 25 MG PO TABS
25.0000 mg | ORAL_TABLET | Freq: Every day | ORAL | Status: DC
  Administered 2023-10-11 – 2023-10-12 (×3): 25 mg via ORAL
  Filled 2023-10-11 (×2): qty 1

## 2023-10-11 MED ORDER — ONDANSETRON HCL 4 MG/2ML IJ SOLN
4.0000 mg | Freq: Once | INTRAMUSCULAR | Status: DC
  Filled 2023-10-11: qty 2

## 2023-10-11 MED ORDER — AMLODIPINE BESYLATE 10 MG PO TABS
10.0000 mg | ORAL_TABLET | Freq: Every day | ORAL | Status: DC
  Administered 2023-10-11 – 2023-10-12 (×3): 10 mg via ORAL
  Filled 2023-10-11 (×2): qty 1

## 2023-10-11 MED ORDER — ACETAMINOPHEN 325 MG PO TABS: 650.0000 mg | ORAL_TABLET | Freq: Four times a day (QID) | ORAL | Status: DC | PRN

## 2023-10-11 MED ORDER — FLEET ENEMA RE ENEM
1.0000 | ENEMA | Freq: Once | RECTAL | Status: AC
  Administered 2023-10-11 (×2): 1 via RECTAL
  Filled 2023-10-11 (×2): qty 1

## 2023-10-11 MED ORDER — ONDANSETRON HCL 4 MG/2ML IJ SOLN
4.0000 mg | Freq: Four times a day (QID) | INTRAMUSCULAR | Status: DC | PRN
Start: 2023-10-11 — End: 2023-10-12

## 2023-10-11 MED ORDER — ALBUTEROL SULFATE (2.5 MG/3ML) 0.083% IN NEBU: 2.5000 mg | INHALATION_SOLUTION | RESPIRATORY_TRACT | Status: DC | PRN

## 2023-10-11 MED ORDER — ONDANSETRON HCL 4 MG PO TABS: 4.0000 mg | ORAL_TABLET | Freq: Four times a day (QID) | ORAL | Status: DC | PRN

## 2023-10-11 MED ORDER — ACETAMINOPHEN 650 MG RE SUPP
650.0000 mg | Freq: Four times a day (QID) | RECTAL | Status: DC | PRN
  Administered 2023-10-12: 650 mg via RECTAL
  Filled 2023-10-11: qty 1

## 2023-10-11 MED ORDER — ATENOLOL 25 MG PO TABS
50.0000 mg | ORAL_TABLET | Freq: Two times a day (BID) | ORAL | Status: DC
Start: 2023-10-11 — End: 2023-10-12
  Administered 2023-10-11 – 2023-10-12 (×5): 50 mg via ORAL
  Filled 2023-10-11 (×3): qty 2

## 2023-10-11 MED ORDER — ALBUTEROL SULFATE (2.5 MG/3ML) 0.083% IN NEBU: 2.5000 mg | INHALATION_SOLUTION | Freq: Four times a day (QID) | RESPIRATORY_TRACT | Status: DC | PRN

## 2023-10-11 MED ORDER — PANTOPRAZOLE SODIUM 40 MG IV SOLR
40.0000 mg | Freq: Two times a day (BID) | INTRAVENOUS | Status: DC
  Administered 2023-10-11 – 2023-10-12 (×5): 40 mg via INTRAVENOUS
  Filled 2023-10-11 (×4): qty 10

## 2023-10-11 MED ORDER — PEG 3350-KCL-NA BICARB-NACL 420 G PO SOLR
2000.0000 mL | Freq: Once | ORAL | Status: AC
  Administered 2023-10-11 (×2): 2000 mL via ORAL

## 2023-10-11 MED ORDER — ONDANSETRON HCL 4 MG/2ML IJ SOLN
4.0000 mg | Freq: Once | INTRAMUSCULAR | Status: AC
  Administered 2023-10-11 (×2): 4 mg via INTRAVENOUS
  Filled 2023-10-11: qty 2

## 2023-10-11 MED ORDER — BUSPIRONE HCL 5 MG PO TABS
15.0000 mg | ORAL_TABLET | Freq: Two times a day (BID) | ORAL | Status: DC
  Administered 2023-10-11 – 2023-10-12 (×5): 15 mg via ORAL
  Filled 2023-10-11 (×3): qty 3

## 2023-10-11 MED ORDER — SODIUM CHLORIDE 0.9 % IV SOLN: INTRAVENOUS | Status: DC

## 2023-10-11 MED ORDER — TRAZODONE HCL 50 MG PO TABS: 25.0000 mg | ORAL_TABLET | Freq: Every evening | ORAL | Status: DC | PRN

## 2023-10-11 MED ORDER — INSULIN ASPART 100 UNIT/ML IJ SOLN
0.0000 [IU] | INTRAMUSCULAR | Status: DC
  Administered 2023-10-11 (×2): 3 [IU] via SUBCUTANEOUS

## 2023-10-11 MED ORDER — FLUTICASONE FUROATE-VILANTEROL 200-25 MCG/ACT IN AEPB
1.0000 | INHALATION_SPRAY | Freq: Every day | RESPIRATORY_TRACT | Status: DC
  Administered 2023-10-12: 1 via RESPIRATORY_TRACT
  Filled 2023-10-11: qty 28

## 2023-10-11 MED ORDER — LOSARTAN POTASSIUM 50 MG PO TABS
50.0000 mg | ORAL_TABLET | Freq: Every day | ORAL | Status: DC
  Administered 2023-10-11 – 2023-10-12 (×3): 50 mg via ORAL
  Filled 2023-10-11 (×2): qty 1

## 2023-10-11 NOTE — H&P (Signed)
 History and Physical  Cheyenne Alvarez FMW:[id number] DOB: 10/01/1965 DOA: 10/11/2023  PCP: Bulah Alm RAMAN, PA-C   Chief Complaint: Abdominal pain, hematemesis  HPI: Cheyenne Alvarez is a 58 y.o. female with medical history significant for bipolar 1 disorder, COPD on room air, iron  deficiency anemia, CAD, depression, known ventral hernia admitted to the hospital with hematemesis abdominal pain and constipation.  The patient told her that she had a EGD and colonoscopy with Dr. Dianna about 6 years ago, she was scheduled for an outpatient EGD and colonoscopy today 8/13.  States that over the last few weeks, the bump in the middle of her abdomen has become larger and more tender to palpation.  She started taking her suppositories and bowel prep yesterday as instructed in preparation for colonoscopy today, but she did not have any significant bowel movement.  Instead, she started to feel very bloated/distended, severe heartburn symptoms, and nausea.  In the evening, she had a large amount of emesis at home, with streaks of blood.  She then had some small hard bowel movements.  She presented to the emergency department where initial workup was relatively benign, initial hemoglobin 12.0, hemodynamically stable.  She had stool guaiac which was negative for occult blood.  Repeat hemoglobin this morning is down slightly to 10.8.  States that she has had no further bowel movement or flatus this morning.  Currently she denies any significant nausea, or heartburn symptoms.  Review of Systems: Please see HPI for pertinent positives and negatives. A complete 10 system review of systems are otherwise negative.  Past Medical History:  Diagnosis Date   Alopecia    Anemia    Anxiety    Bipolar 1 disorder (HCC)    COPD (chronic obstructive pulmonary disease) (HCC)    Coronary artery disease    Depression    Diabetes mellitus 2010   Gastritis    GERD (gastroesophageal reflux disease)    Headache     migraines   Hypertension 2010   Obesity    Pneumonia    PONV (postoperative nausea and vomiting)    PTSD (post-traumatic stress disorder)    Schizophrenia (HCC)    Tobacco use    Past Surgical History:  Procedure Laterality Date   ABDOMINAL HYSTERECTOMY     heavy bleeding   BREAST BIOPSY Right 07/21/2022   MM RT BREAST BX W LOC DEV 1ST LESION IMAGE BX SPEC STEREO GUIDE 07/21/2022 [facility]   CARDIAC CATHETERIZATION     CHOLECYSTECTOMY     COLONOSCOPY  05/14/14   hemorrhoids, otherwise normal; Dr. Jerrell Dianna   COLONOSCOPY WITH PROPOFOL  N/A 05/14/2014   Procedure: COLONOSCOPY WITH PROPOFOL ;  Surgeon: Jerrell Dianna, MD;  Location: [facility] ENDOSCOPY;  Service: Endoscopy;  Laterality: N/A;   ESOPHAGOGASTRODUODENOSCOPY (EGD) WITH PROPOFOL  N/A 05/14/2014   Procedure: ESOPHAGOGASTRODUODENOSCOPY (EGD) WITH PROPOFOL ;  Surgeon: Jerrell Dianna, MD;  Location: [facility] ENDOSCOPY;  Service: Endoscopy;  Laterality: N/A;   FRACTURE SURGERY     left leg   LEG SURGERY     TUBAL LIGATION     Social History:  reports that she quit smoking about 9 years ago. Her smoking use included cigarettes. She has never used smokeless tobacco. She reports that she does not drink alcohol and does not use drugs.  Allergies  Allergen Reactions   Dexilant [Dexlansoprazole] Shortness Of Breath, Diarrhea, Nausea And Vomiting and Other (See Comments)    Chest pain and abdominal pain (also)   Famotidine Anaphylaxis   Meperidine Hcl Anaphylaxis  Dilaudid  [Hydromorphone  Hcl] Other (See Comments)    Change in mental state   Gabapentin  Other (See Comments)    Memory loss   Hydrocodone  Nausea And Vomiting   Ramipril Swelling and Other (See Comments)    Angioedema    Ziprasidone Hcl Other (See Comments)    Caused convulsions   Invokana [Canagliflozin] Rash   Iodinated Contrast Media Itching and Rash   Iohexol  Itching and Rash   Tape Rash    Use paper tape only    Family History  Problem Relation Age  of Onset   Breast cancer Maternal Grandmother    Seizures Father    Diabetes Mother    Kidney failure Mother    Cancer Brother    Mental illness Brother    Breast cancer Sister 84   Diabetes Sister    Drug abuse Sister    Hypertension Sister    Diabetes Other    Cancer Other    Hypertension Other      Prior to Admission medications   Medication Sig Start Date End Date Taking? Authorizing Provider  escitalopram  (LEXAPRO ) 10 MG tablet Take 10 mg by mouth daily. 09/04/23  Yes [provider]  losartan  (COZAAR ) 50 MG tablet Take 50 mg by mouth daily. 09/07/23  Yes [provider]  spironolactone  (ALDACTONE ) 25 MG tablet Take 25 mg by mouth daily. 09/04/23  Yes [provider]  Accu-Chek Softclix Lancets lancets TEST 1 TO 2 TIMES DAILY 03/29/23   Tysinger, Alm RAMAN, PA-C  acetaminophen -codeine  (TYLENOL  #3) 300-30 MG tablet Take 1 tablet by mouth 2 (two) times daily as needed for moderate pain (pain score 4-6). 09/29/23   Tysinger, Alm RAMAN, PA-C  amitriptyline  (ELAVIL ) 75 MG tablet TAKE 1 TABLET(75 MG) BY MOUTH AT BEDTIME AS NEEDED FOR SLEEP 03/28/23   Urbano Albright, MD  amLODipine  (NORVASC ) 10 MG tablet Take 1 tablet (10 mg total) by mouth daily. 09/07/23   Tysinger, Alm RAMAN, PA-C  atenolol  (TENORMIN ) 50 MG tablet Take 1 tablet by mouth twice daily 10/09/23   Tysinger, Alm RAMAN, PA-C  Blood Glucose Monitoring Suppl DEVI Test 1-2 times day. Pend on Insurance 03/28/23   Tysinger, Alm RAMAN, PA-C  busPIRone  (BUSPAR ) 15 MG tablet Take 15 mg by mouth 2 (two) times daily. 03/17/23   [provider]  Cholecalciferol (VITAMIN D3) 50 MCG (2000 UT) capsule Take 1 capsule by mouth every day 10/09/23   Tysinger, Alm RAMAN, PA-C  clotrimazole -betamethasone  (LOTRISONE ) cream Apply 1 Application topically daily. 06/28/23   Janit Thresa HERO, DPM  ferrous sulfate  325 (65 FE) MG EC tablet Take 1 tablet (325 mg total) by mouth daily. 09/27/23   Tysinger, Alm RAMAN, PA-C  fluticasone -salmeterol  (ADVAIR) 250-50 MCG/ACT AEPB Inhale 1 puff twice daily, morning & at bedtime 07/25/23   Tysinger, Alm RAMAN, PA-C  fluvoxaMINE (LUVOX) 25 MG tablet Take 25 mg by mouth 2 (two) times daily.    [provider]  glimepiride  (AMARYL ) 2 MG tablet Take 1 tablet (2 mg total) by mouth daily with breakfast. 09/07/23   Tysinger, Alm RAMAN, PA-C  glucose blood (ACCU-CHEK GUIDE TEST) test strip TEST 1 TO 2 TIMES DAILY 03/29/23   Tysinger, Alm RAMAN, PA-C  INGREZZA 80 MG capsule Take 80 mg by mouth daily. 05/19/20   [provider]  lamoTRIgine Starter Kit-Orange 42 x 25 MG & 7 x 100 MG KIT Take by mouth as directed. 03/20/23   [provider]  nitroGLYCERIN (NITROSTAT) 0.4 MG SL tablet Place  0.4 mg under the tongue every 5 (five) minutes as needed for chest pain. Reported on 07/15/2015    [provider]  pantoprazole  (PROTONIX ) 40 MG tablet Take 1 capsule by mouth twice daily 06/12/23   Tysinger, Alm RAMAN, PA-C  rosuvastatin  (CRESTOR ) 20 MG tablet Take 1 tablet (20 mg total) by mouth daily. 09/07/23   Tysinger, Alm RAMAN, PA-C  sucralfate  (CARAFATE ) 1 g tablet Take 1 tablet (1 g total) by mouth 4 (four) times daily -  with meals and at bedtime. 09/06/23   Tysinger, Alm RAMAN, PA-C  Suvorexant (BELSOMRA) 20 MG TABS Take by mouth.    [provider]  tirzepatide  (MOUNJARO ) 15 MG/0.5ML Pen ADMINISTER 15 MG UNDER THE SKIN 1 TIME A WEEK 10/11/23   Tysinger, Alm RAMAN, PA-C  VENTOLIN  HFA 108 (90 Base) MCG/ACT inhaler Inhale 2 puffs into the lungs every 6 (six) hours as needed for wheezing or shortness of breath. 09/07/23   Tysinger, Alm RAMAN, PA-C    Physical Exam: BP (!) 154/72 (BP Location: Right Arm)   Pulse 81   Temp 98 F (36.7 C)   Resp 18   Ht 5' 3 (1.6 m)   Wt 91.6 kg   SpO2 98%   BMI 35.78 kg/m  General:  Alert, oriented, calm and pleasant but quite anxious, in no acute distress  Eyes: EOMI, clear conjuctivae, white sclerea Cardiovascular: RRR, no murmurs or rubs, no  peripheral edema  Respiratory: clear to auscultation bilaterally, no wheezes, no crackles  Abdomen: soft, she has a soft tender reducible mass in the epigastrium, abdomen is slightly distended, slow bowel tones heard on auscultation Skin: dry, no rashes  Musculoskeletal: no joint effusions, normal range of motion  Psychiatric: appropriate affect, normal speech  Neurologic: extraocular muscles intact, clear speech, moving all extremities with intact sensorium         Labs on Admission:  Basic Metabolic Panel: Recent Labs  Lab 10/11/23 0231  NA 139  K 3.6  CL 102  CO2 25  GLUCOSE 142*  BUN 14  CREATININE 1.27*  CALCIUM  9.4   Liver Function Tests: Recent Labs  Lab 10/11/23 0231  AST 30  ALT 36  ALKPHOS 139*  BILITOT <0.2  PROT 7.3  ALBUMIN 3.9   No results for input(s): LIPASE, AMYLASE in the last 168 hours. No results for input(s): AMMONIA in the last 168 hours. CBC: Recent Labs  Lab 10/11/23 0117 10/11/23 0817  WBC 12.9*  --   NEUTROABS 8.0*  --   HGB 12.0 10.8*  HCT 36.6 34.0*  MCV 83.6  --   PLT 288  --    Cardiac Enzymes: No results for input(s): CKTOTAL, CKMB, CKMBINDEX, TROPONINI in the last 168 hours. BNP (last 3 results) No results for input(s): BNP in the last 8760 hours.  ProBNP (last 3 results) No results for input(s): PROBNP in the last 8760 hours.  CBG: Recent Labs  Lab 10/11/23 0539  GLUCAP 93    Radiological Exams on Admission: No results found. Assessment/Plan Dodi Modean Mccullum is a 58 y.o. female with medical history significant for bipolar 1 disorder, COPD on room air, iron  deficiency anemia, CAD, depression, known ventral hernia admitted to the hospital with hematemesis abdominal pain and constipation.  Hematemesis-with blood loss anemia, patient is hemodynamically stable.  Etiology of her hematemesis is unclear, could be due to PUD, gastritis/esophagitis or Mallory-Weiss tear. -Observation  admission -Telemetry monitor -IV PPI twice daily -Monitor every 8 hours hemoglobin -Avoid blood thinners -Keep  n.p.o. for now -Discussed with [facility] Dr. Elicia, who has seen in consultation this morning  Abdominal pain with constipation-on examination patient appears to have ventral hernia, given her severe abdominal pain and lack of significant bowel movement as well as complaints of distention, will need to rule out obstruction although hernia appears to be reducible -CT abdomen pelvis without contrast (due to contrast allergy) -Ice chips okay, otherwise strict n.p.o. for the time being until CT scan is completed  COPD-patient is stable on room air, without evidence of exacerbation  Non-insulin -dependent type 2 diabetes-carb modified diet when eating, with sliding scale insulin   Hypertension-we will hold p.o. medications for the time being until CT scan is completed, in the meantime we will place on IV hydralazine  as needed  Depression-will continue home medications once reconciled, and once CT abdomen is completed to rule out bowel obstruction  DVT prophylaxis: SCDs only    Code Status: Full Code  Consults called: [facility] Dr. Elicia  Admission status: Observation  Time spent: 56 minutes  Derrich Gaby CHRISTELLA Gail MD [facility] Pager [phone]  If 7PM-7AM, please contact night-coverage [email or web address] Password [email or web address]  10/11/2023, 10:01 AM

## 2023-10-11 NOTE — ED Provider Notes (Signed)
 [place] EMERGENCY DEPARTMENT AT [facility] Provider Note   CSN: [id number] Arrival date & time: 10/11/23  0102     Patient presents with: Hematemesis and Chest Pain   Cheyenne Alvarez is a 58 y.o. female.   The history is provided by the patient.  Emesis Severity:  Moderate Timing:  Intermittent Quality:  Bright red blood and stomach contents Progression:  Unchanged Chronicity:  New Recent urination:  Normal Context: not post-tussive   Relieved by:  Nothing Worsened by:  Nothing Associated symptoms: no fever   Associated symptoms comment:  Constipation  Risk factors: no alcohol use   Patient with Schizophrenia and anemia presents with blood and emesis and constipation during a colonoscopy prep.  Patient is supposed to have EGD and colonoscopy today with Dr. Dianna and was not passing stool (iron  therapy) but then started having emesis with blood.      Past Medical History:  Diagnosis Date   Alopecia    Anemia    Anxiety    Bipolar 1 disorder (HCC)    COPD (chronic obstructive pulmonary disease) (HCC)    Coronary artery disease    Depression    Diabetes mellitus 2010   Gastritis    GERD (gastroesophageal reflux disease)    Headache    migraines   Hypertension 2010   Obesity    Pneumonia    PONV (postoperative nausea and vomiting)    PTSD (post-traumatic stress disorder)    Schizophrenia (HCC)    Tobacco use      Prior to Admission medications   Medication Sig Start Date End Date Taking? Authorizing Provider  Accu-Chek Softclix Lancets lancets TEST 1 TO 2 TIMES DAILY 03/29/23   Tysinger, Alm RAMAN, PA-C  acetaminophen -codeine  (TYLENOL  #3) 300-30 MG tablet Take 1 tablet by mouth 2 (two) times daily as needed for moderate pain (pain score 4-6). 09/29/23   Tysinger, Alm RAMAN, PA-C  amitriptyline  (ELAVIL ) 75 MG tablet TAKE 1 TABLET(75 MG) BY MOUTH AT BEDTIME AS NEEDED FOR SLEEP 03/28/23   Urbano Albright, MD  amLODipine  (NORVASC ) 10 MG tablet Take 1  tablet (10 mg total) by mouth daily. 09/07/23   Tysinger, Alm RAMAN, PA-C  atenolol  (TENORMIN ) 50 MG tablet Take 1 tablet by mouth twice daily 10/09/23   Tysinger, Alm RAMAN, PA-C  Blood Glucose Monitoring Suppl DEVI Test 1-2 times day. Pend on Insurance 03/28/23   Tysinger, Alm RAMAN, PA-C  busPIRone  (BUSPAR ) 15 MG tablet Take 15 mg by mouth 2 (two) times daily. 03/17/23   [provider]  Cholecalciferol (VITAMIN D3) 50 MCG (2000 UT) capsule Take 1 capsule by mouth every day 10/09/23   Tysinger, Alm RAMAN, PA-C  clotrimazole -betamethasone  (LOTRISONE ) cream Apply 1 Application topically daily. 06/28/23   Janit Thresa HERO, DPM  ferrous sulfate  325 (65 FE) MG EC tablet Take 1 tablet (325 mg total) by mouth daily. 09/27/23   Tysinger, Alm RAMAN, PA-C  fluticasone -salmeterol (ADVAIR) 250-50 MCG/ACT AEPB Inhale 1 puff twice daily, morning & at bedtime 07/25/23   Tysinger, Alm RAMAN, PA-C  fluvoxaMINE (LUVOX) 25 MG tablet Take 25 mg by mouth 2 (two) times daily.    [provider]  glimepiride  (AMARYL ) 2 MG tablet Take 1 tablet (2 mg total) by mouth daily with breakfast. 09/07/23   Tysinger, Alm RAMAN, PA-C  glucose blood (ACCU-CHEK GUIDE TEST) test strip TEST 1 TO 2 TIMES DAILY 03/29/23   Tysinger, Alm RAMAN, PA-C  INGREZZA 80 MG capsule Take 80 mg by mouth daily. 05/19/20  [provider]  lamoTRIgine Starter Kit-Orange 42 x 25 MG & 7 x 100 MG KIT Take by mouth as directed. 03/20/23   [provider]  Lancet Device MISC 1-2 times daily 03/28/23   Tysinger, Alm RAMAN, PA-C  Lancets Misc. MISC 1-2 times a day 03/28/23   Tysinger, Alm RAMAN, PA-C  nitroGLYCERIN (NITROSTAT) 0.4 MG SL tablet Place 0.4 mg under the tongue every 5 (five) minutes as needed for chest pain. Reported on 07/15/2015    [provider]  pantoprazole  (PROTONIX ) 40 MG tablet Take 1 capsule by mouth twice daily 06/12/23   Tysinger, Alm RAMAN, PA-C  rosuvastatin  (CRESTOR ) 20 MG tablet Take 1 tablet (20 mg total) by mouth daily.  09/07/23   Tysinger, Alm RAMAN, PA-C  sucralfate  (CARAFATE ) 1 g tablet Take 1 tablet (1 g total) by mouth 4 (four) times daily -  with meals and at bedtime. 09/06/23   Tysinger, Alm RAMAN, PA-C  Suvorexant (BELSOMRA) 20 MG TABS Take by mouth.    [provider]  tirzepatide  (MOUNJARO ) 15 MG/0.5ML Pen Inject 15 mg into the skin once a week. 09/07/23   Tysinger, Alm RAMAN, PA-C  VENTOLIN  HFA 108 (90 Base) MCG/ACT inhaler Inhale 2 puffs into the lungs every 6 (six) hours as needed for wheezing or shortness of breath. 09/07/23   Tysinger, Alm RAMAN, PA-C    Allergies: Dexilant [dexlansoprazole], Famotidine, Meperidine hcl, Dilaudid  [hydromorphone  hcl], Gabapentin , Hydrocodone , Ramipril, Ziprasidone hcl, Invokana [canagliflozin], Iodinated contrast media, Iohexol , and Tape    Review of Systems  Constitutional:  Negative for fever.  Gastrointestinal:  Positive for constipation, nausea and vomiting.  Genitourinary:  Negative for dysuria.  All other systems reviewed and are negative.   Updated Vital Signs BP (!) 143/92   Pulse 77   Temp 97.7 F (36.5 C)   Resp 18   Ht 5' 3 (1.6 m)   Wt 92.7 kg   SpO2 100%   BMI 36.20 kg/m   Physical Exam Vitals and nursing note reviewed. Exam conducted with a chaperone present.  Constitutional:      General: She is not in acute distress.    Appearance: Normal appearance. She is well-developed.  HENT:     Head: Normocephalic and atraumatic.     Nose: Nose normal.  Eyes:     Pupils: Pupils are equal, round, and reactive to light.  Cardiovascular:     Rate and Rhythm: Normal rate and regular rhythm.     Pulses: Normal pulses.     Heart sounds: Normal heart sounds.  Pulmonary:     Effort: Pulmonary effort is normal. No respiratory distress.     Breath sounds: Normal breath sounds.  Abdominal:     General: Bowel sounds are normal.     Palpations: Abdomen is soft.     Tenderness: There is no abdominal tenderness. There is no guarding or rebound.      Hernia: No hernia is present.  Genitourinary:    Rectum: Guaiac result negative.  Musculoskeletal:        General: Normal range of motion.     Cervical back: Neck supple.  Skin:    General: Skin is warm and dry.     Capillary Refill: Capillary refill takes less than 2 seconds.     Findings: No erythema or rash.  Neurological:     General: No focal deficit present.     Mental Status: She is alert and oriented to person, place, and time.  Deep Tendon Reflexes: Reflexes normal.  Psychiatric:        Mood and Affect: Mood normal.     (all labs ordered are listed, but only abnormal results are displayed) Results for orders placed or performed during the hospital encounter of 10/11/23  CBC with Differential   Collection Time: 10/11/23  1:17 AM  Result Value Ref Range   WBC 12.9 (H) 4.0 - 10.5 K/uL   RBC 4.38 3.87 - 5.11 MIL/uL   Hemoglobin 12.0 12.0 - 15.0 g/dL   HCT 63.3 63.9 - 53.9 %   MCV 83.6 80.0 - 100.0 fL   MCH 27.4 26.0 - 34.0 pg   MCHC 32.8 30.0 - 36.0 g/dL   RDW 84.4 88.4 - 84.4 %   Platelets 288 150 - 400 K/uL   nRBC 0.0 0.0 - 0.2 %   Neutrophils Relative % 62 %   Neutro Abs 8.0 (H) 1.7 - 7.7 K/uL   Lymphocytes Relative 30 %   Lymphs Abs 3.8 0.7 - 4.0 K/uL   Monocytes Relative 7 %   Monocytes Absolute 0.9 0.1 - 1.0 K/uL   Eosinophils Relative 1 %   Eosinophils Absolute 0.1 0.0 - 0.5 K/uL   Basophils Relative 0 %   Basophils Absolute 0.0 0.0 - 0.1 K/uL   Immature Granulocytes 0 %   Abs Immature Granulocytes 0.05 0.00 - 0.07 K/uL  Troponin T, High Sensitivity   Collection Time: 10/11/23  1:17 AM  Result Value Ref Range   Troponin T High Sensitivity <15 0 - 19 ng/L  Comprehensive metabolic panel with GFR   Collection Time: 10/11/23  2:31 AM  Result Value Ref Range   Sodium 139 135 - 145 mmol/L   Potassium 3.6 3.5 - 5.1 mmol/L   Chloride 102 98 - 111 mmol/L   CO2 25 22 - 32 mmol/L   Glucose, Bld 142 (H) 70 - 99 mg/dL   BUN 14 6 - 20 mg/dL   Creatinine,  Ser 8.72 (H) 0.44 - 1.00 mg/dL   Calcium  9.4 8.9 - 10.3 mg/dL   Total Protein 7.3 6.5 - 8.1 g/dL   Albumin 3.9 3.5 - 5.0 g/dL   AST 30 15 - 41 U/L   ALT 36 0 - 44 U/L   Alkaline Phosphatase 139 (H) 38 - 126 U/L   Total Bilirubin <0.2 0.0 - 1.2 mg/dL   GFR, Estimated 48 (L) >60 mL/min   Anion gap 12 5 - 15  Troponin T, High Sensitivity   Collection Time: 10/11/23  2:31 AM  Result Value Ref Range   Troponin T High Sensitivity <15 0 - 19 ng/L  Occult blood card to lab, stool Provider will collect   Collection Time: 10/11/23  3:29 AM  Result Value Ref Range   Fecal Occult Bld NEGATIVE NEGATIVE   No results found.   EKG: EKG Interpretation Date/Time:  Wednesday October 11 2023 01:13:33 EDT Ventricular Rate:  95 PR Interval:  142 QRS Duration:  72 QT Interval:  346 QTC Calculation: 434 R Axis:   58  Text Interpretation: Normal sinus rhythm Confirmed by Nettie, Myeisha Kruser ([id number]) on 10/11/2023 1:15:14 AM  Radiology: No results found.   Procedures   Medications Ordered in the ED  ondansetron  (ZOFRAN ) injection 4 mg (4 mg Intravenous Given 10/11/23 0119)  alum & mag hydroxide-simeth (MAALOX/MYLANTA) 200-200-20 MG/5ML suspension 30 mL (30 mLs Oral Given 10/11/23 0121)  pantoprazole  (PROTONIX ) injection 80 mg (80 mg Intravenous Given 10/11/23 0356)  Medical Decision Making Emesis with colonoscopy prep with blood   Amount and/or Complexity of Data Reviewed Independent Historian:     Details: Sister see above  External Data Reviewed: notes.    Details: Previous notes reviewed  Labs: ordered.    Details: Negative hemoccult.  Negative troponin < 15. Slight elevation of white count 12.9, normal hemoglobin 12  ECG/medicine tests: ordered and independent interpretation performed. Decision-making details documented in ED Course. Discussion of management or test interpretation with external provider(s): Secure chat to Dr. Lawernce of GI regarding  consult Case d/w Dr. Franky   Risk OTC drugs. Prescription drug management. Decision regarding hospitalization.     Final diagnoses:  Hematemesis with nausea   The patient appears reasonably stabilized for admission considering the current resources, flow, and capabilities available in the ED at this time, and I doubt any other [facility] requiring further screening and/or treatment in the ED prior to admission.  ED Discharge Orders     None          Tesslyn Baumert, MD 10/11/23 [phone]

## 2023-10-11 NOTE — Care Management Obs Status (Signed)
 MEDICARE OBSERVATION STATUS NOTIFICATION   Patient Details  Name: Nakayla Rorabaugh MRN: [id number] Date of Birth: 09-21-1965   Medicare Observation Status Notification Given:  Yes    Alfonse JONELLE Rex, RN 10/11/2023, 2:15 PM

## 2023-10-11 NOTE — ED Triage Notes (Signed)
 Reports working with GI- scheduled for endo/colonoscopy tomorrow- GERD and possible GI bleed. Coming in for sudden onset of bloody emesis and intense central chest pressure.

## 2023-10-11 NOTE — H&P (View-Only) (Signed)
 Referring Provider: [facility] Primary Care Physician:  Bulah Alm RAMAN, PA-C Primary Gastroenterologist:  Dr. Dianna  Reason for Consultation: Hematemesis, abdominal pain and distention  HPI: Cheyenne Alvarez is a 58 y.o. female with past medical history of chronic anemia requiring IV iron  infusion in the past, history of anxiety, depression, bipolar disorder, COPD and coronary artery disease presented to the hospital with abdominal pain and hematemesis.  She was seen in [place] [facility] office last month for evaluation of anemia and epigastric abdominal pain.  She was scheduled for EGD and colonoscopy today.  Patient completed her half of the prep last night along with Dulcolax pills but did not had any bowel movements.  Subsequently started having vomiting and also noted fresh blood in the vomiting.  She finally had 1 bowel movement around 4:30 AM which was solid stool.  She denies any further nausea and vomiting.  She is complaining of generalized abdominal pain and distention.  Patient with history of chronic constipation was given trial of Linzess during office visit without any significant improvement in the constipation.  She is also complaining of chronic reflux symptoms.  Denies any trouble swallowing or pain while swallowing.  Patient was upset and rude to the staff but was apologetic to me.  Past Medical History:  Diagnosis Date   Alopecia    Anemia    Anxiety    Bipolar 1 disorder (HCC)    COPD (chronic obstructive pulmonary disease) (HCC)    Coronary artery disease    Depression    Diabetes mellitus 2010   Gastritis    GERD (gastroesophageal reflux disease)    Headache    migraines   Hypertension 2010   Obesity    Pneumonia    PONV (postoperative nausea and vomiting)    PTSD (post-traumatic stress disorder)    Schizophrenia (HCC)    Tobacco use     Past Surgical History:  Procedure Laterality Date   ABDOMINAL HYSTERECTOMY     heavy bleeding   BREAST BIOPSY Right  07/21/2022   MM RT BREAST BX W LOC DEV 1ST LESION IMAGE BX SPEC STEREO GUIDE 07/21/2022 [facility]   CARDIAC CATHETERIZATION     CHOLECYSTECTOMY     COLONOSCOPY  05/14/14   hemorrhoids, otherwise normal; Dr. Jerrell Dianna   COLONOSCOPY WITH PROPOFOL  N/A 05/14/2014   Procedure: COLONOSCOPY WITH PROPOFOL ;  Surgeon: Jerrell Dianna, MD;  Location: [facility] ENDOSCOPY;  Service: Endoscopy;  Laterality: N/A;   ESOPHAGOGASTRODUODENOSCOPY (EGD) WITH PROPOFOL  N/A 05/14/2014   Procedure: ESOPHAGOGASTRODUODENOSCOPY (EGD) WITH PROPOFOL ;  Surgeon: Jerrell Dianna, MD;  Location: [facility] ENDOSCOPY;  Service: Endoscopy;  Laterality: N/A;   FRACTURE SURGERY     left leg   LEG SURGERY     TUBAL LIGATION      Prior to Admission medications   Medication Sig Start Date End Date Taking? Authorizing Provider  acetaminophen -codeine  (TYLENOL  #3) 300-30 MG tablet Take 1 tablet by mouth 2 (two) times daily as needed for moderate pain (pain score 4-6). Patient taking differently: Take 1 tablet by mouth daily as needed for moderate pain (pain score 4-6). 09/29/23  Yes Tysinger, Alm RAMAN, PA-C  amitriptyline  (ELAVIL ) 75 MG tablet TAKE 1 TABLET(75 MG) BY MOUTH AT BEDTIME AS NEEDED FOR SLEEP Patient taking differently: Take 75 mg by mouth at bedtime as needed for sleep. 03/28/23  Yes Urbano Albright, MD  amLODipine  (NORVASC ) 10 MG tablet Take 1 tablet (10 mg total) by mouth daily. 09/07/23  Yes Tysinger, Alm RAMAN, PA-C  atenolol  (TENORMIN )  50 MG tablet Take 1 tablet by mouth twice daily 10/09/23  Yes Tysinger, Alm RAMAN, PA-C  busPIRone  (BUSPAR ) 15 MG tablet Take 15 mg by mouth 2 (two) times daily. 03/17/23  Yes [provider]  Cholecalciferol (VITAMIN D3) 50 MCG (2000 UT) capsule Take 1 capsule by mouth every day 10/09/23  Yes Tysinger, Alm RAMAN, PA-C  escitalopram  (LEXAPRO ) 10 MG tablet Take 10 mg by mouth daily. 09/04/23  Yes [provider]  ferrous sulfate  325 (65 FE) MG EC tablet Take 1 tablet (325 mg total)  by mouth daily. 09/27/23  Yes Tysinger, Alm RAMAN, PA-C  fluticasone -salmeterol (ADVAIR) 250-50 MCG/ACT AEPB Inhale 1 puff twice daily, morning & at bedtime 07/25/23  Yes Tysinger, Alm RAMAN, PA-C  glimepiride  (AMARYL ) 2 MG tablet Take 1 tablet (2 mg total) by mouth daily with breakfast. 09/07/23  Yes Tysinger, Alm RAMAN, PA-C  INGREZZA 80 MG capsule Take 80 mg by mouth daily. 05/19/20  Yes [provider]  losartan  (COZAAR ) 50 MG tablet Take 50 mg by mouth daily. 09/07/23  Yes [provider]  nitroGLYCERIN (NITROSTAT) 0.4 MG SL tablet Place 0.4 mg under the tongue every 5 (five) minutes as needed for chest pain. Reported on 07/15/2015   Yes [provider]  pantoprazole  (PROTONIX ) 40 MG tablet Take 1 capsule by mouth twice daily 06/12/23  Yes Tysinger, Alm RAMAN, PA-C  ramelteon (ROZEREM) 8 MG tablet Take 8 mg by mouth at bedtime as needed for sleep. 08/26/23  Yes [provider]  rosuvastatin  (CRESTOR ) 20 MG tablet Take 1 tablet (20 mg total) by mouth daily. 09/07/23  Yes Tysinger, Alm RAMAN, PA-C  spironolactone  (ALDACTONE ) 25 MG tablet Take 25 mg by mouth daily. 09/04/23  Yes [provider]  Suvorexant (BELSOMRA) 20 MG TABS Take 20 mg by mouth daily.   Yes [provider]  tirzepatide  (MOUNJARO ) 15 MG/0.5ML Pen ADMINISTER 15 MG UNDER THE SKIN 1 TIME A WEEK 10/11/23  Yes Tysinger, Alm RAMAN, PA-C  VENTOLIN  HFA 108 (90 Base) MCG/ACT inhaler Inhale 2 puffs into the lungs every 6 (six) hours as needed for wheezing or shortness of breath. 09/07/23  Yes Tysinger, Alm RAMAN, PA-C  Accu-Chek Softclix Lancets lancets TEST 1 TO 2 TIMES DAILY 03/29/23   Tysinger, Alm RAMAN, PA-C  clotrimazole -betamethasone  (LOTRISONE ) cream Apply 1 Application topically daily. Patient not taking: Reported on 10/11/2023 06/28/23   Janit Thresa HERO, DPM  glucose blood (ACCU-CHEK GUIDE TEST) test strip TEST 1 TO 2 TIMES DAILY 03/29/23   Tysinger, Alm RAMAN, PA-C  lamoTRIgine Starter Kit-Orange 42 x 25 MG &  7 x 100 MG KIT Take by mouth as directed. Patient not taking: Reported on 10/11/2023 03/20/23   [provider]  sucralfate  (CARAFATE ) 1 g tablet Take 1 tablet (1 g total) by mouth 4 (four) times daily -  with meals and at bedtime. Patient not taking: Reported on 10/11/2023 09/06/23   Tysinger, Alm RAMAN, PA-C  traZODone  (DESYREL ) 100 MG tablet Take 100 mg by mouth daily as needed. Patient not taking: Reported on 10/11/2023 07/06/23   [provider]  VRAYLAR 6 MG CAPS Take 1 capsule by mouth daily. 07/19/23   [provider]    Scheduled Meds:  insulin  aspart  0-15 Units Subcutaneous Q4H   pantoprazole  (PROTONIX ) IV  40 mg Intravenous Q12H   Continuous Infusions: PRN Meds:.acetaminophen  **OR** acetaminophen , albuterol , hydrALAZINE , ondansetron  **OR** ondansetron  (ZOFRAN ) IV, traZODone   Allergies as of 10/11/2023 - Review Complete 10/11/2023  Allergen Reaction Noted  Dexilant [dexlansoprazole] Shortness Of Breath, Diarrhea, Nausea And Vomiting, and Other (See Comments) 03/13/2014   Famotidine Anaphylaxis 04/06/2009   Meperidine hcl Anaphylaxis 04/06/2009   Dilaudid  [hydromorphone  hcl] Other (See Comments) 04/07/2015   Gabapentin  Other (See Comments) 04/30/2014   Hydrocodone  Nausea And Vomiting 03/23/2015   Ramipril Swelling and Other (See Comments) 08/15/2012   Ziprasidone hcl Other (See Comments) 01/26/2015   Invokana [canagliflozin] Rash 03/13/2014   Iodinated contrast media Itching and Rash 08/14/2012   Iohexol  Itching and Rash 08/14/2012   Tape Rash 05/13/2014    Family History  Problem Relation Age of Onset   Breast cancer Maternal Grandmother    Seizures Father    Diabetes Mother    Kidney failure Mother    Cancer Brother    Mental illness Brother    Breast cancer Sister 35   Diabetes Sister    Drug abuse Sister    Hypertension Sister    Diabetes Other    Cancer Other    Hypertension Other     Social History   Socioeconomic History   Marital  status: Divorced    Spouse name: Not on file   Number of children: Not on file   Years of education: Not on file   Highest education level: Not on file  Occupational History   Not on file  Tobacco Use   Smoking status: Former    Current packs/day: 0.00    Types: Cigarettes    Quit date: 12/18/2013    Years since quitting: 9.8   Smokeless tobacco: Never  Vaping Use   Vaping status: Never Used  Substance and Sexual Activity   Alcohol use: No    Alcohol/week: 0.0 standard drinks of alcohol   Drug use: No   Sexual activity: Not Currently    Partners: Male    Birth control/protection: Surgical  Other Topics Concern   Not on file  Social History Narrative   Not on file   Social Drivers of Health   Financial Resource Strain: Low Risk  (11/22/2022)   Overall Financial Resource Strain (CARDIA)    Difficulty of Paying Living Expenses: Not hard at all  Food Insecurity: No Food Insecurity (10/11/2023)   Hunger Vital Sign    Worried About Running Out of Food in the Last Year: Never true    Ran Out of Food in the Last Year: Never true  Transportation Needs: No Transportation Needs (10/11/2023)   PRAPARE - Administrator, Civil Service (Medical): No    Lack of Transportation (Non-Medical): No  Physical Activity: Insufficiently Active (11/22/2022)   Exercise Vital Sign    Days of Exercise per Week: 3 days    Minutes of Exercise per Session: 20 min  Stress: Stress Concern Present (11/22/2022)   [employer] of Occupational Health - Occupational Stress Questionnaire    Feeling of Stress : Rather much  Social Connections: Moderately Isolated (11/22/2022)   Social Connection and Isolation Panel    Frequency of Communication with Friends and Family: More than three times a week    Frequency of Social Gatherings with Friends and Family: More than three times a week    Attends Religious Services: More than 4 times per year    Active Member of [employer] or Organizations: No     Attends Banker Meetings: Never    Marital Status: Divorced  Catering manager Violence: Not At Risk (10/11/2023)   Humiliation, Afraid, Rape, and Kick questionnaire    Fear of Current or  Ex-Partner: No    Emotionally Abused: No    Physically Abused: No    Sexually Abused: No    Review of Systems: All negative except as stated above in HPI.  Physical Exam: Vital signs: Vitals:   10/11/23 0541 10/11/23 0919  BP: (!) 171/86 (!) 154/72  Pulse: 68 81  Resp: 17 18  Temp: 98.6 F (37 C) 98 F (36.7 C)  SpO2: 100% 98%   Last BM Date : 10/09/23 General:   Alert,  Well-developed, well-nourished, pleasant and cooperative in NAD Normocephalic, atraumatic Extraocular movement intact Lungs: No visible respiratory distress Heart:  Regular rate and rhythm; no murmurs, clicks, rubs,  or gallops. Abdomen: Abdomen is moderately distended with epigastric tenderness to palpation, bowel sounds present, no peritoneal signs She was somewhat upset and anxious Alert and oriented x 3 Rectal:  Deferred  GI:  Lab Results: Recent Labs    10/11/23 0117 10/11/23 0817  WBC 12.9*  --   HGB 12.0 10.8*  HCT 36.6 34.0*  PLT 288  --    BMET Recent Labs    10/11/23 0231  NA 139  K 3.6  CL 102  CO2 25  GLUCOSE 142*  BUN 14  CREATININE 1.27*  CALCIUM  9.4   LFT Recent Labs    10/11/23 0231  PROT 7.3  ALBUMIN 3.9  AST 30  ALT 36  ALKPHOS 139*  BILITOT <0.2   PT/INR No results for input(s): LABPROT, INR in the last 72 hours.   Studies/Results: No results found.  Impression/Plan: -Nausea vomiting followed by hematemesis while doing prep for colonoscopy. - Abdominal pain and distention after taking prep for colonoscopy.  She had no bowel movement yesterday after taking half gallon of prep but had 1 small bowel movement this morning. - Chronic anemia requiring IV iron  infusion in the past. - History of anxiety and bipolar  disorder  Recommendations --------------------------- - Agree with CT abdomen pelvis without contrast to rule out any ileus or other issues before starting colonoscopy prep again.  No signs of obstruction on clinical examination.  She is allergic to IV contrast. - If CT scan negative, we will resume the prep for colonoscopy and plan for EGD and colonoscopy tomorrow. - She can have clear liquid diet today after CT scan - Keep n.p.o. past midnight -Continue PPI - Management options discussed with the patient and family at bedside.  They verbalized understanding.  They were rude to staff initially but they were apologetic to me.  Risks (bleeding, infection, bowel perforation that could require surgery, sedation-related changes in cardiopulmonary systems), benefits (identification and possible treatment of source of symptoms, exclusion of certain causes of symptoms), and alternatives (watchful waiting, radiographic imaging studies, empiric medical treatment)  were explained to patient/family in detail and patient wishes to proceed.   Addendum at 12:59 PM. - CT scan negative for any acute changes.  Putting order for colonoscopy prep and Fleet enema.  EGD and colonoscopy tomorrow.    LOS: 0 days   Layla Lah  MD, FACP 10/11/2023, 11:18 AM  Contact #  [phone]

## 2023-10-11 NOTE — Consult Note (Addendum)
 Referring Provider: [facility] Primary Care Physician:  Bulah Alm RAMAN, PA-C Primary Gastroenterologist:  Dr. Dianna  Reason for Consultation: Hematemesis, abdominal pain and distention  HPI: Cheyenne Alvarez is a 58 y.o. female with past medical history of chronic anemia requiring IV iron  infusion in the past, history of anxiety, depression, bipolar disorder, COPD and coronary artery disease presented to the hospital with abdominal pain and hematemesis.  She was seen in [place] [facility] office last month for evaluation of anemia and epigastric abdominal pain.  She was scheduled for EGD and colonoscopy today.  Patient completed her half of the prep last night along with Dulcolax pills but did not had any bowel movements.  Subsequently started having vomiting and also noted fresh blood in the vomiting.  She finally had 1 bowel movement around 4:30 AM which was solid stool.  She denies any further nausea and vomiting.  She is complaining of generalized abdominal pain and distention.  Patient with history of chronic constipation was given trial of Linzess during office visit without any significant improvement in the constipation.  She is also complaining of chronic reflux symptoms.  Denies any trouble swallowing or pain while swallowing.  Patient was upset and rude to the staff but was apologetic to me.  Past Medical History:  Diagnosis Date   Alopecia    Anemia    Anxiety    Bipolar 1 disorder (HCC)    COPD (chronic obstructive pulmonary disease) (HCC)    Coronary artery disease    Depression    Diabetes mellitus 2010   Gastritis    GERD (gastroesophageal reflux disease)    Headache    migraines   Hypertension 2010   Obesity    Pneumonia    PONV (postoperative nausea and vomiting)    PTSD (post-traumatic stress disorder)    Schizophrenia (HCC)    Tobacco use     Past Surgical History:  Procedure Laterality Date   ABDOMINAL HYSTERECTOMY     heavy bleeding   BREAST BIOPSY Right  07/21/2022   MM RT BREAST BX W LOC DEV 1ST LESION IMAGE BX SPEC STEREO GUIDE 07/21/2022 [facility]   CARDIAC CATHETERIZATION     CHOLECYSTECTOMY     COLONOSCOPY  05/14/14   hemorrhoids, otherwise normal; Dr. Jerrell Dianna   COLONOSCOPY WITH PROPOFOL  N/A 05/14/2014   Procedure: COLONOSCOPY WITH PROPOFOL ;  Surgeon: Jerrell Dianna, MD;  Location: [facility] ENDOSCOPY;  Service: Endoscopy;  Laterality: N/A;   ESOPHAGOGASTRODUODENOSCOPY (EGD) WITH PROPOFOL  N/A 05/14/2014   Procedure: ESOPHAGOGASTRODUODENOSCOPY (EGD) WITH PROPOFOL ;  Surgeon: Jerrell Dianna, MD;  Location: [facility] ENDOSCOPY;  Service: Endoscopy;  Laterality: N/A;   FRACTURE SURGERY     left leg   LEG SURGERY     TUBAL LIGATION      Prior to Admission medications   Medication Sig Start Date End Date Taking? Authorizing Provider  acetaminophen -codeine  (TYLENOL  #3) 300-30 MG tablet Take 1 tablet by mouth 2 (two) times daily as needed for moderate pain (pain score 4-6). Patient taking differently: Take 1 tablet by mouth daily as needed for moderate pain (pain score 4-6). 09/29/23  Yes Tysinger, Alm RAMAN, PA-C  amitriptyline  (ELAVIL ) 75 MG tablet TAKE 1 TABLET(75 MG) BY MOUTH AT BEDTIME AS NEEDED FOR SLEEP Patient taking differently: Take 75 mg by mouth at bedtime as needed for sleep. 03/28/23  Yes Urbano Albright, MD  amLODipine  (NORVASC ) 10 MG tablet Take 1 tablet (10 mg total) by mouth daily. 09/07/23  Yes Tysinger, Alm RAMAN, PA-C  atenolol  (TENORMIN )  50 MG tablet Take 1 tablet by mouth twice daily 10/09/23  Yes Tysinger, Alm RAMAN, PA-C  busPIRone  (BUSPAR ) 15 MG tablet Take 15 mg by mouth 2 (two) times daily. 03/17/23  Yes [provider]  Cholecalciferol (VITAMIN D3) 50 MCG (2000 UT) capsule Take 1 capsule by mouth every day 10/09/23  Yes Tysinger, Alm RAMAN, PA-C  escitalopram  (LEXAPRO ) 10 MG tablet Take 10 mg by mouth daily. 09/04/23  Yes [provider]  ferrous sulfate  325 (65 FE) MG EC tablet Take 1 tablet (325 mg total)  by mouth daily. 09/27/23  Yes Tysinger, Alm RAMAN, PA-C  fluticasone -salmeterol (ADVAIR) 250-50 MCG/ACT AEPB Inhale 1 puff twice daily, morning & at bedtime 07/25/23  Yes Tysinger, Alm RAMAN, PA-C  glimepiride  (AMARYL ) 2 MG tablet Take 1 tablet (2 mg total) by mouth daily with breakfast. 09/07/23  Yes Tysinger, Alm RAMAN, PA-C  INGREZZA 80 MG capsule Take 80 mg by mouth daily. 05/19/20  Yes [provider]  losartan  (COZAAR ) 50 MG tablet Take 50 mg by mouth daily. 09/07/23  Yes [provider]  nitroGLYCERIN (NITROSTAT) 0.4 MG SL tablet Place 0.4 mg under the tongue every 5 (five) minutes as needed for chest pain. Reported on 07/15/2015   Yes [provider]  pantoprazole  (PROTONIX ) 40 MG tablet Take 1 capsule by mouth twice daily 06/12/23  Yes Tysinger, Alm RAMAN, PA-C  ramelteon (ROZEREM) 8 MG tablet Take 8 mg by mouth at bedtime as needed for sleep. 08/26/23  Yes [provider]  rosuvastatin  (CRESTOR ) 20 MG tablet Take 1 tablet (20 mg total) by mouth daily. 09/07/23  Yes Tysinger, Alm RAMAN, PA-C  spironolactone  (ALDACTONE ) 25 MG tablet Take 25 mg by mouth daily. 09/04/23  Yes [provider]  Suvorexant (BELSOMRA) 20 MG TABS Take 20 mg by mouth daily.   Yes [provider]  tirzepatide  (MOUNJARO ) 15 MG/0.5ML Pen ADMINISTER 15 MG UNDER THE SKIN 1 TIME A WEEK 10/11/23  Yes Tysinger, Alm RAMAN, PA-C  VENTOLIN  HFA 108 (90 Base) MCG/ACT inhaler Inhale 2 puffs into the lungs every 6 (six) hours as needed for wheezing or shortness of breath. 09/07/23  Yes Tysinger, Alm RAMAN, PA-C  Accu-Chek Softclix Lancets lancets TEST 1 TO 2 TIMES DAILY 03/29/23   Tysinger, Alm RAMAN, PA-C  clotrimazole -betamethasone  (LOTRISONE ) cream Apply 1 Application topically daily. Patient not taking: Reported on 10/11/2023 06/28/23   Janit Thresa HERO, DPM  glucose blood (ACCU-CHEK GUIDE TEST) test strip TEST 1 TO 2 TIMES DAILY 03/29/23   Tysinger, Alm RAMAN, PA-C  lamoTRIgine Starter Kit-Orange 42 x 25 MG &  7 x 100 MG KIT Take by mouth as directed. Patient not taking: Reported on 10/11/2023 03/20/23   [provider]  sucralfate  (CARAFATE ) 1 g tablet Take 1 tablet (1 g total) by mouth 4 (four) times daily -  with meals and at bedtime. Patient not taking: Reported on 10/11/2023 09/06/23   Tysinger, Alm RAMAN, PA-C  traZODone  (DESYREL ) 100 MG tablet Take 100 mg by mouth daily as needed. Patient not taking: Reported on 10/11/2023 07/06/23   [provider]  VRAYLAR 6 MG CAPS Take 1 capsule by mouth daily. 07/19/23   [provider]    Scheduled Meds:  insulin  aspart  0-15 Units Subcutaneous Q4H   pantoprazole  (PROTONIX ) IV  40 mg Intravenous Q12H   Continuous Infusions: PRN Meds:.acetaminophen  **OR** acetaminophen , albuterol , hydrALAZINE , ondansetron  **OR** ondansetron  (ZOFRAN ) IV, traZODone   Allergies as of 10/11/2023 - Review Complete 10/11/2023  Allergen Reaction Noted  Dexilant [dexlansoprazole] Shortness Of Breath, Diarrhea, Nausea And Vomiting, and Other (See Comments) 03/13/2014   Famotidine Anaphylaxis 04/06/2009   Meperidine hcl Anaphylaxis 04/06/2009   Dilaudid  [hydromorphone  hcl] Other (See Comments) 04/07/2015   Gabapentin  Other (See Comments) 04/30/2014   Hydrocodone  Nausea And Vomiting 03/23/2015   Ramipril Swelling and Other (See Comments) 08/15/2012   Ziprasidone hcl Other (See Comments) 01/26/2015   Invokana [canagliflozin] Rash 03/13/2014   Iodinated contrast media Itching and Rash 08/14/2012   Iohexol  Itching and Rash 08/14/2012   Tape Rash 05/13/2014    Family History  Problem Relation Age of Onset   Breast cancer Maternal Grandmother    Seizures Father    Diabetes Mother    Kidney failure Mother    Cancer Brother    Mental illness Brother    Breast cancer Sister 35   Diabetes Sister    Drug abuse Sister    Hypertension Sister    Diabetes Other    Cancer Other    Hypertension Other     Social History   Socioeconomic History   Marital  status: Divorced    Spouse name: Not on file   Number of children: Not on file   Years of education: Not on file   Highest education level: Not on file  Occupational History   Not on file  Tobacco Use   Smoking status: Former    Current packs/day: 0.00    Types: Cigarettes    Quit date: 12/18/2013    Years since quitting: 9.8   Smokeless tobacco: Never  Vaping Use   Vaping status: Never Used  Substance and Sexual Activity   Alcohol use: No    Alcohol/week: 0.0 standard drinks of alcohol   Drug use: No   Sexual activity: Not Currently    Partners: Male    Birth control/protection: Surgical  Other Topics Concern   Not on file  Social History Narrative   Not on file   Social Drivers of Health   Financial Resource Strain: Low Risk  (11/22/2022)   Overall Financial Resource Strain (CARDIA)    Difficulty of Paying Living Expenses: Not hard at all  Food Insecurity: No Food Insecurity (10/11/2023)   Hunger Vital Sign    Worried About Running Out of Food in the Last Year: Never true    Ran Out of Food in the Last Year: Never true  Transportation Needs: No Transportation Needs (10/11/2023)   PRAPARE - Administrator, Civil Service (Medical): No    Lack of Transportation (Non-Medical): No  Physical Activity: Insufficiently Active (11/22/2022)   Exercise Vital Sign    Days of Exercise per Week: 3 days    Minutes of Exercise per Session: 20 min  Stress: Stress Concern Present (11/22/2022)   [employer] of Occupational Health - Occupational Stress Questionnaire    Feeling of Stress : Rather much  Social Connections: Moderately Isolated (11/22/2022)   Social Connection and Isolation Panel    Frequency of Communication with Friends and Family: More than three times a week    Frequency of Social Gatherings with Friends and Family: More than three times a week    Attends Religious Services: More than 4 times per year    Active Member of [employer] or Organizations: No     Attends Banker Meetings: Never    Marital Status: Divorced  Catering manager Violence: Not At Risk (10/11/2023)   Humiliation, Afraid, Rape, and Kick questionnaire    Fear of Current or  Ex-Partner: No    Emotionally Abused: No    Physically Abused: No    Sexually Abused: No    Review of Systems: All negative except as stated above in HPI.  Physical Exam: Vital signs: Vitals:   10/11/23 0541 10/11/23 0919  BP: (!) 171/86 (!) 154/72  Pulse: 68 81  Resp: 17 18  Temp: 98.6 F (37 C) 98 F (36.7 C)  SpO2: 100% 98%   Last BM Date : 10/09/23 General:   Alert,  Well-developed, well-nourished, pleasant and cooperative in NAD Normocephalic, atraumatic Extraocular movement intact Lungs: No visible respiratory distress Heart:  Regular rate and rhythm; no murmurs, clicks, rubs,  or gallops. Abdomen: Abdomen is moderately distended with epigastric tenderness to palpation, bowel sounds present, no peritoneal signs She was somewhat upset and anxious Alert and oriented x 3 Rectal:  Deferred  GI:  Lab Results: Recent Labs    10/11/23 0117 10/11/23 0817  WBC 12.9*  --   HGB 12.0 10.8*  HCT 36.6 34.0*  PLT 288  --    BMET Recent Labs    10/11/23 0231  NA 139  K 3.6  CL 102  CO2 25  GLUCOSE 142*  BUN 14  CREATININE 1.27*  CALCIUM  9.4   LFT Recent Labs    10/11/23 0231  PROT 7.3  ALBUMIN 3.9  AST 30  ALT 36  ALKPHOS 139*  BILITOT <0.2   PT/INR No results for input(s): LABPROT, INR in the last 72 hours.   Studies/Results: No results found.  Impression/Plan: -Nausea vomiting followed by hematemesis while doing prep for colonoscopy. - Abdominal pain and distention after taking prep for colonoscopy.  She had no bowel movement yesterday after taking half gallon of prep but had 1 small bowel movement this morning. - Chronic anemia requiring IV iron  infusion in the past. - History of anxiety and bipolar  disorder  Recommendations --------------------------- - Agree with CT abdomen pelvis without contrast to rule out any ileus or other issues before starting colonoscopy prep again.  No signs of obstruction on clinical examination.  She is allergic to IV contrast. - If CT scan negative, we will resume the prep for colonoscopy and plan for EGD and colonoscopy tomorrow. - She can have clear liquid diet today after CT scan - Keep n.p.o. past midnight -Continue PPI - Management options discussed with the patient and family at bedside.  They verbalized understanding.  They were rude to staff initially but they were apologetic to me.  Risks (bleeding, infection, bowel perforation that could require surgery, sedation-related changes in cardiopulmonary systems), benefits (identification and possible treatment of source of symptoms, exclusion of certain causes of symptoms), and alternatives (watchful waiting, radiographic imaging studies, empiric medical treatment)  were explained to patient/family in detail and patient wishes to proceed.   Addendum at 12:59 PM. - CT scan negative for any acute changes.  Putting order for colonoscopy prep and Fleet enema.  EGD and colonoscopy tomorrow.    LOS: 0 days   Layla Lah  MD, FACP 10/11/2023, 11:18 AM  Contact #  [phone]

## 2023-10-11 NOTE — Progress Notes (Signed)
 [facility] paged via AMION for admission order upon patients arrival to the floor in room 1315. Plan of care ongoing.

## 2023-10-11 NOTE — TOC Initial Note (Signed)
 Transition of Care [facility]) - Initial/Assessment Note    Patient Details  Name: Cheyenne Alvarez MRN: [id number] Date of Birth: Feb 17, 1966  Transition of [facility]) CM/SW Contact:    Alfonse JONELLE Rex, RN Phone Number: 10/11/2023, 2:37 PM  Clinical Narrative:  Met with patient at bedside to introduce role of TOC/NCM and review for dc planning, patient confirmed she has an established PCP, lives with daily HHA, reports she has good support from her daughters, home DME: handicap accessible apartment, wheelchair, RW. MOON completed. TOC will continue to follow.                   Expected Discharge Plan: Home/Self Care Barriers to Discharge: Continued Medical Work up   Patient Goals and CMS Choice Patient states their goals for this hospitalization and ongoing recovery are:: return home          Expected Discharge Plan and Services       Living arrangements for the past 2 months: Apartment                                      Prior Living Arrangements/Services Living arrangements for the past 2 months: Apartment Lives with:: Self Patient language and need for interpreter reviewed:: Yes Do you feel safe going back to the place where you live?: Yes      Need for Family Participation in Patient Care: Yes (Comment) Care giver support system in place?: Yes (comment) Current home services: DME, Homehealth aide (handicap accessible apartment, wheelchair, walker) Criminal Activity/Legal Involvement Pertinent to Current Situation/Hospitalization: No - Comment as needed  Activities of Daily Living   ADL Screening (condition at time of admission) Independently performs ADLs?: Yes (appropriate for developmental age) Is the patient deaf or have difficulty hearing?: No Does the patient have difficulty seeing, even when wearing glasses/contacts?: No Does the patient have difficulty concentrating, remembering, or making decisions?: No  Permission Sought/Granted                   Emotional Assessment Appearance:: Appears stated age Attitude/Demeanor/Rapport: Engaged Affect (typically observed): Accepting Orientation: : Oriented to Self, Oriented to Place, Oriented to  Time, Oriented to Situation Alcohol / Substance Use: Not Applicable Psych Involvement: No (comment)  Admission diagnosis:  GI bleeding [K92.2] Hematemesis with nausea [K92.0] Patient Active Problem List   Diagnosis Date Noted   GI bleeding 10/11/2023   Hyperlipidemia associated with type 2 diabetes mellitus (HCC) 11/24/2020   Vitamin D  deficiency 11/24/2020   Encounter for health maintenance examination in adult 11/24/2020   Labile blood glucose 05/08/2020   Type 2 diabetes mellitus with hyperglycemia, with long-term current use of insulin  (HCC) 01/22/2020   Elevated alkaline phosphatase level 01/22/2020   Requires daily assistance for activities of daily living (ADL) and comfort needs 01/20/2020   Umbilical hernia without obstruction and without gangrene 10/15/2019   Medicare annual wellness visit, subsequent 10/14/2019   Leukocytosis 10/14/2019   Chronic obstructive pulmonary disease (HCC) 06/28/2017   Former smoker 06/28/2017   Tremor 06/28/2017   SUI (stress urinary incontinence, female) 03/25/2017   Encounter for screening mammogram for malignant neoplasm of breast 07/01/2016   High risk medication use 07/01/2016   Vaccine counseling 07/01/2016   Incontinence in female 07/01/2016   Anemia, iron  deficiency 06/15/2015   Decreased activities of daily living (ADL) 03/23/2015   Cognitive safety issue 03/23/2015   Schizoaffective disorder, bipolar type (  HCC)    Uncontrolled type 2 diabetes mellitus with hyperglycemia (HCC) 07/08/2014   Essential hypertension 07/08/2014   ACE inhibitor-aggravated angioedema 08/14/2012   OBESITY 08/03/2009   BIPOLAR AFFECTIVE DISORDER 04/06/2009   Generalized anxiety disorder 04/06/2009   POST TRAUMATIC STRESS SYNDROME 04/06/2009   GERD 04/06/2009    MENOPAUSE, SURGICAL 04/06/2009   PCP:  Bulah Alm RAMAN, PA-C Pharmacy:   [facility] [place], [place] - [address] [place] [address] Phone: [phone] Fax: [phone]  [id number] - [place], [place] - [address] AT [facility] [address] [place] [address] Phone: [phone] Fax: [phone]     Social Drivers of Health (SDOH) Social History: SDOH Screenings   Food Insecurity: No Food Insecurity (10/11/2023)  Housing: Low Risk  (10/11/2023)  Transportation Needs: No Transportation Needs (10/11/2023)  Utilities: Not At Risk (10/11/2023)  Alcohol Screen: Low Risk  (11/22/2022)  Depression (PHQ2-9): High Risk (03/23/2023)  Financial Resource Strain: Low Risk  (11/22/2022)  Physical Activity: Insufficiently Active (11/22/2022)  Social Connections: Moderately Isolated (11/22/2022)  Stress: Stress Concern Present (11/22/2022)  Tobacco Use: Medium Risk (10/11/2023)  Health Literacy: Adequate Health Literacy (11/22/2022)   SDOH Interventions:     Readmission Risk Interventions     No data to display

## 2023-10-12 ENCOUNTER — Observation Stay (HOSPITAL_COMMUNITY): Admitting: Anesthesiology

## 2023-10-12 ENCOUNTER — Observation Stay (HOSPITAL_BASED_OUTPATIENT_CLINIC_OR_DEPARTMENT_OTHER): Admitting: Anesthesiology

## 2023-10-12 ENCOUNTER — Encounter (HOSPITAL_COMMUNITY)
Admission: EM | Disposition: A | Discharge status: Home / Self Care | Payer: Self-pay | Source: Home / Self Care | Attending: Emergency Medicine

## 2023-10-12 DIAGNOSIS — J449 Chronic obstructive pulmonary disease, unspecified: Secondary | ICD-10-CM

## 2023-10-12 DIAGNOSIS — K2100 Gastro-esophageal reflux disease with esophagitis, without bleeding: Secondary | ICD-10-CM

## 2023-10-12 DIAGNOSIS — F250 Schizoaffective disorder, bipolar type: Secondary | ICD-10-CM

## 2023-10-12 DIAGNOSIS — I2510 Atherosclerotic heart disease of native coronary artery without angina pectoris: Secondary | ICD-10-CM

## 2023-10-12 DIAGNOSIS — D125 Benign neoplasm of sigmoid colon: Secondary | ICD-10-CM | POA: Diagnosis not present

## 2023-10-12 DIAGNOSIS — K922 Gastrointestinal hemorrhage, unspecified: Secondary | ICD-10-CM | POA: Diagnosis not present

## 2023-10-12 DIAGNOSIS — E1169 Type 2 diabetes mellitus with other specified complication: Secondary | ICD-10-CM

## 2023-10-12 DIAGNOSIS — K219 Gastro-esophageal reflux disease without esophagitis: Secondary | ICD-10-CM

## 2023-10-12 DIAGNOSIS — K920 Hematemesis: Secondary | ICD-10-CM | POA: Insufficient documentation

## 2023-10-12 DIAGNOSIS — E1165 Type 2 diabetes mellitus with hyperglycemia: Secondary | ICD-10-CM

## 2023-10-12 DIAGNOSIS — I10 Essential (primary) hypertension: Secondary | ICD-10-CM

## 2023-10-12 DIAGNOSIS — F411 Generalized anxiety disorder: Secondary | ICD-10-CM

## 2023-10-12 DIAGNOSIS — K2900 Acute gastritis without bleeding: Secondary | ICD-10-CM

## 2023-10-12 DIAGNOSIS — D131 Benign neoplasm of stomach: Secondary | ICD-10-CM | POA: Diagnosis not present

## 2023-10-12 DIAGNOSIS — F319 Bipolar disorder, unspecified: Secondary | ICD-10-CM

## 2023-10-12 DIAGNOSIS — E785 Hyperlipidemia, unspecified: Secondary | ICD-10-CM

## 2023-10-12 HISTORY — PX: COLONOSCOPY: SHX5424

## 2023-10-12 HISTORY — PX: ESOPHAGOGASTRODUODENOSCOPY: SHX5428

## 2023-10-12 HISTORY — PX: BIOPSY OF SKIN SUBCUTANEOUS TISSUE AND/OR MUCOUS MEMBRANE: SHX6741

## 2023-10-12 LAB — GLUCOSE, CAPILLARY
Glucose-Capillary: 91 mg/dL (ref 70–99)
Glucose-Capillary: 111 mg/dL — ABNORMAL HIGH (ref 70–99)
Glucose-Capillary: 81 mg/dL (ref 70–99)

## 2023-10-12 LAB — HEMOGLOBIN AND HEMATOCRIT, BLOOD
HCT: 36.8 % (ref 36.0–46.0)
Hemoglobin: 11.6 g/dL — ABNORMAL LOW (ref 12.0–15.0)

## 2023-10-12 LAB — BASIC METABOLIC PANEL WITH GFR
Anion gap: 9 (ref 5–15)
BUN: 10 mg/dL (ref 6–20)
CO2: 25 mmol/L (ref 22–32)
Calcium: 9.3 mg/dL (ref 8.9–10.3)
Chloride: 104 mmol/L (ref 98–111)
Creatinine, Ser: 1.22 mg/dL — ABNORMAL HIGH (ref 0.44–1.00)
GFR, Estimated: 51 mL/min — ABNORMAL LOW (ref >60)
Glucose, Bld: 100 mg/dL — ABNORMAL HIGH (ref 70–99)
Potassium: 3.7 mmol/L (ref 3.5–5.1)
Sodium: 138 mmol/L (ref 135–145)

## 2023-10-12 LAB — CBC
HCT: 36.7 % (ref 36.0–46.0)
Hemoglobin: 11.6 g/dL — ABNORMAL LOW (ref 12.0–15.0)
MCH: 27.6 pg (ref 26.0–34.0)
MCHC: 31.6 g/dL (ref 30.0–36.0)
MCV: 87.2 fL (ref 80.0–100.0)
Platelets: 317 K/uL (ref 150–400)
RBC: 4.21 MIL/uL (ref 3.87–5.11)
RDW: 16 % — ABNORMAL HIGH (ref 11.5–15.5)
WBC: 11.1 K/uL — ABNORMAL HIGH (ref 4.0–10.5)
nRBC: 0 % (ref 0.0–0.2)

## 2023-10-12 LAB — HIV ANTIBODY (ROUTINE TESTING W REFLEX): HIV Screen 4th Generation wRfx: NONREACTIVE

## 2023-10-12 SURGERY — EGD (ESOPHAGOGASTRODUODENOSCOPY)
Anesthesia: Monitor Anesthesia Care

## 2023-10-12 MED ORDER — SUCRALFATE 1 G PO TABS: 1.0000 g | ORAL_TABLET | Freq: Two times a day (BID) | ORAL | 0 refills | Status: DC

## 2023-10-12 MED ORDER — PROPOFOL 500 MG/50ML IV EMUL
INTRAVENOUS | Status: DC | PRN
  Administered 2023-10-12: 180 ug/kg/min via INTRAVENOUS

## 2023-10-12 MED ORDER — LIDOCAINE 2% (20 MG/ML) 5 ML SYRINGE
INTRAMUSCULAR | Status: DC | PRN
  Administered 2023-10-12: 100 mg via INTRAVENOUS

## 2023-10-12 MED ORDER — SODIUM CHLORIDE 0.9 % IV SOLN
INTRAVENOUS | Status: AC | PRN
  Administered 2023-10-12: 500 mL via INTRAMUSCULAR

## 2023-10-12 NOTE — Brief Op Note (Signed)
 10/12/2023  12:24 PM  PATIENT:  Roderick Karna Mace  58 y.o. female  PRE-OPERATIVE DIAGNOSIS:  Hematemesis, epigastric abdominal pain, anemia  POST-OPERATIVE DIAGNOSIS:  Gastric Bx- r/o H. pylori, gastric polyps- Bx forceps, sigmopid colon polyp- Bx forceps  PROCEDURE:  Procedure(s): EGD (ESOPHAGOGASTRODUODENOSCOPY) (N/A) COLONOSCOPY (N/A) BIOPSY, SKIN, SUBCUTANEOUS TISSUE, OR MUCOUS MEMBRANE  SURGEON:  Surgeons and Role:    * Trayce Caravello, MD - Primary  Findings ------------ - EGD showed gastritis, mild esophagitis and duodenitis and few likely fundic gland gastric polyps.  Biopsies taken. - Colonoscopy showed fair prep, normal TI, small sigmoid colon polyp, diverticulosis and hemorrhoids.  Recommendations ------------------------ - Start soft diet and advance as tolerated -Recommend Protonix  40 mg once a day for reflux and gastritis -Recommend sucralfate  1 g twice a day for 4 weeks -She will likely need repeat colonoscopy in 3 to 5 years because of fair prep depending on biopsy findings -Okay to discharge from GI standpoint. -GI will sign off.  Call us  back if needed  Layla Lah MD, FACP 10/12/2023, 12:25 PM  Contact #  [phone]

## 2023-10-12 NOTE — Anesthesia Postprocedure Evaluation (Signed)
 Anesthesia Post Note  Patient: Cheyenne Alvarez  Procedure(s) Performed: EGD (ESOPHAGOGASTRODUODENOSCOPY) COLONOSCOPY BIOPSY, SKIN, SUBCUTANEOUS TISSUE, OR MUCOUS MEMBRANE     Patient location during evaluation: Endoscopy Anesthesia Type: MAC Level of consciousness: awake Pain management: pain level controlled Vital Signs Assessment: post-procedure vital signs reviewed and stable Respiratory status: spontaneous breathing, nonlabored ventilation and respiratory function stable Cardiovascular status: blood pressure returned to baseline and stable Postop Assessment: no apparent nausea or vomiting Anesthetic complications: no   No notable events documented.  Last Vitals:  Vitals:   10/12/23 1226 10/12/23 1350  BP: 132/87 (!) 167/78  Pulse: 72 62  Resp: (!) 27 18  Temp:  36.8 C  SpO2: 100% 100%    Last Pain:  Vitals:   10/12/23 1350  TempSrc: Oral  PainSc:                  Malajah Oceguera P Lon Klippel

## 2023-10-12 NOTE — Progress Notes (Signed)
 Pt requested to shower. Informed her that nurse would need an order to take her off telemetry for the shower. Nurse messaged Lavanda Horns, NP of pt's request. No order received. Informed pt of this and that the concern, per NP, was her risk of falling and to wait until 7am to take the shower. Pt stated understanding and agreed to washing self at sink.

## 2023-10-12 NOTE — Op Note (Signed)
 [facility] Patient Name: Cheyenne Alvarez Procedure Date: 10/12/2023 MRN: [id number] Attending MD: Layla Lah , MD, [id number] Date of Birth: 1966/01/29 CSN: [id number] Age: 58 Admit Type: Inpatient Procedure:                Upper GI endoscopy Indications:              Epigastric abdominal pain, Suspected esophageal                            reflux Providers:                Layla Lah, MD, Willy Hummer, RN, Felice Sar, Technician Referring MD:              Medicines:                Sedation Administered by an Anesthesia Professional Complications:            No immediate complications. Estimated Blood Loss:     Estimated blood loss was minimal. Procedure:                Pre-Anesthesia Assessment:                           - Prior to the procedure, a History and Physical                            was performed, and patient medications and                            allergies were reviewed. [name] tolerance of                            previous anesthesia was also reviewed. The risks                            and benefits of the procedure and the sedation                            options and risks were discussed with the patient.                            All questions were answered, and informed consent                            was obtained. Prior Anticoagulants: The patient has                            taken no anticoagulant or antiplatelet agents. ASA                            Grade Assessment: III - A patient with severe  systemic disease. After reviewing the risks and                            benefits, the patient was deemed in satisfactory                            condition to undergo the procedure.                           After obtaining informed consent, the endoscope was                            passed under direct vision. Throughout the                             procedure, the patient's blood pressure, pulse, and                            oxygen saturations were monitored continuously. The                            [id number] ([id number] Olympus endoscope was introduced                            through the mouth, and advanced to the second part                            of duodenum. The upper GI endoscopy was                            accomplished without difficulty. The patient                            tolerated the procedure well. Scope In: Scope Out: Findings:      LA Grade A (one or more mucosal breaks less than 5 mm, not extending       between tops of 2 mucosal folds) esophagitis with no bleeding was found       in the distal esophagus.      Diffuse mild inflammation characterized by congestion (edema), erosions       and erythema was found in the stomach. Biopsies were taken with a cold       forceps for histology.      Two small sessile polyps were found in the stomach. Biopsies were taken       with a cold forceps for histology.      The cardia and gastric fundus were normal on retroflexion.      Scattered mild inflammation characterized by congestion (edema) and       erythema was found in the duodenal bulb.      The first portion of the duodenum and second portion of the duodenum       were normal. Impression:               - LA Grade A reflux esophagitis with no bleeding.                           -  Chronic gastritis. Biopsied.                           - Two gastric polyps. Biopsied.                           - Duodenitis.                           - Normal first portion of the duodenum and second                            portion of the duodenum. Moderate Sedation:      Moderate (conscious) sedation was personally administered by an       anesthesia professional. The following parameters were monitored: oxygen       saturation, heart rate, blood pressure, and response to care. Recommendation:           - Patient has a  contact number available for                            emergencies. The signs and symptoms of potential                            delayed complications were discussed with the                            patient. Return to normal activities tomorrow.                            Written discharge instructions were provided to the                            patient.                           - Resume previous diet.                           - Continue present medications.                           - Await pathology results.                           - Perform a colonoscopy. Procedure Code(s):        --- Professional ---                           [phone], Esophagogastroduodenoscopy, flexible,                            transoral; with biopsy, single or multiple Diagnosis Code(s):        --- Professional ---                           K21.00, Gastro-esophageal reflux disease with  esophagitis, without bleeding                           K29.50, Unspecified chronic gastritis without                            bleeding                           K31.7, Polyp of stomach and duodenum                           K29.80, Duodenitis without bleeding                           R10.13, Epigastric pain CPT copyright 2022 American [employer]. All rights reserved. The codes documented in this report are preliminary and upon coder review may  be revised to meet current compliance requirements. Layla Lah, MD Layla Lah, MD 10/12/2023 12:15:27 PM Number of Addenda: 0

## 2023-10-12 NOTE — Transfer of Care (Signed)
 Immediate Anesthesia Transfer of Care Note  Patient: Cheyenne Alvarez  Procedure(s) Performed: EGD (ESOPHAGOGASTRODUODENOSCOPY) COLONOSCOPY BIOPSY, SKIN, SUBCUTANEOUS TISSUE, OR MUCOUS MEMBRANE  Patient Location: PACU  Anesthesia Type:MAC  Level of Consciousness: awake and alert   Airway & Oxygen Therapy: Patient Spontanous Breathing  Post-op Assessment: Report given to RN  Post vital signs: Reviewed and stable  Last Vitals:  Vitals Value Taken Time  BP    Temp    Pulse 77 10/12/23 12:14  Resp 15 10/12/23 12:14  SpO2 100 % 10/12/23 12:14  Vitals shown include unfiled device data.  Last Pain:  Vitals:   10/12/23 1148  TempSrc:   PainSc: 0-No pain      Patients Stated Pain Goal: 0 (10/11/23 1541)  Complications: No notable events documented.

## 2023-10-12 NOTE — Progress Notes (Signed)
 PROGRESS NOTE    Cheyenne Alvarez  FMW:[id number] DOB: Mar 10, 1965 DOA: 10/11/2023 PCP: Bulah Alm RAMAN, PA-C    Chief Complaint  Patient presents with   Hematemesis   Chest Pain    Brief Narrative:  Cheyenne Alvarez is a 58 y.o. female with medical history significant for bipolar 1 disorder, COPD on room air, iron  deficiency anemia, CAD, depression, known ventral hernia admitted to the hospital with hematemesis abdominal pain and constipation.    Assessment & Plan:   Principal Problem:   GI bleeding Active Problems:   BIPOLAR AFFECTIVE DISORDER   Generalized anxiety disorder   GERD   Uncontrolled type 2 diabetes mellitus with hyperglycemia (HCC)   Essential hypertension   Schizoaffective disorder, bipolar type (HCC)   Chronic obstructive pulmonary disease (HCC)   Hyperlipidemia associated with type 2 diabetes mellitus (HCC)   Hematemesis  #1 hematemesis/acute blood loss anemia -Patient had presented with nausea vomiting abdominal pain and constipation while trying to perform bowel prep at home for EGD colonoscopy in the outpatient setting. - Etiology of hematemesis felt likely to be a Mallory-Weiss tear versus PUD versus gastritis/esophagitis. - Patient with no further hematemesis and states she is feeling well. -Hemoglobin currently stable at 11.6. - Continue IV PPI twice daily. - Patient currently n.p.o. awaiting EGD/colonoscopy to be done today.  [facility].  2.  Abdominal pain with constipation -Per admitting physician patient noted to have a ventral hernia that was reducible however presented with severe abdominal pain without any bowel movement.  Plan - Patient clinically improved denies any further abdominal pain and noted to have BM with bowel prep. - CT abdomen and pelvis done without contrast with no acute abnormalities noted. - Patient for EGD colonoscopy today per GI. - Per GI.  3.  COPD -Stable.  4.  Non-insulin -dependent type 2 diabetes  mellitus -Hemoglobin A1c 6.8. - CBG noted at 111 this morning. - SSI.  5.  Hypertension -Continue home regimen of Cozaar , spironolactone , Norvasc , atenolol .  6.  Hyperlipidemia -Continue statin.  7.  Bipolar disorder/schizoaffective disorder/depression/generalized anxiety disorder -Continue home regimen Lexapro , BuSpar .  8.  GERD -PPI.     DVT prophylaxis: SCDs Code Status: Full Family Communication: Updated patient and family at bedside. Disposition: Likely home when clinically improved and cleared by gastroenterology.  Status is: Observation The patient remains OBS appropriate and will d/c before 2 midnights.   Consultants:  Gastroenterology: Dr.Brahmbhatt 10/11/2023  Procedures:  CT abdomen and pelvis 10/11/2023 EGD/colonoscopy pending  Antimicrobials: Anti-infectives (From admission, onward)    None         Subjective: Patient sitting up in bed.  Stated she drank as much of the bowel preparation was supposed to.  States stool is now clear.  Was under the misunderstanding that they were only going to do an upper endoscopy today.  Family at bedside.  Objective: Vitals:   10/12/23 0749 10/12/23 0910 10/12/23 1106 10/12/23 1112  BP:  (!) 161/81 (!) 185/68 (!) 170/80  Pulse:  66 68 69  Resp:   19   Temp:   (!) 97.5 F (36.4 C)   TempSrc:   Temporal   SpO2: 99%  99%   Weight:   91.6 kg   Height:   5' 3 (1.6 m)     Intake/Output Summary (Last 24 hours) at 10/12/2023 1141 Last data filed at 10/12/2023 0959 Gross per 24 hour  Intake 672.83 ml  Output --  Net 672.83 ml   [employer]   10/11/23  0335 10/11/23 0519 10/12/23 1106  Weight: 92.7 kg 91.6 kg 91.6 kg    Examination:  General exam: Appears calm and comfortable  Respiratory system: Clear to auscultation. Respiratory effort normal. Cardiovascular system: S1 & S2 heard, RRR. No JVD, murmurs, rubs, gallops or clicks. No pedal edema. Gastrointestinal system: Abdomen is nondistended, soft and  nontender. No organomegaly or masses felt. Normal bowel sounds heard. Central nervous system: Alert and oriented. No focal neurological deficits. Extremities: Symmetric 5 x 5 power. Skin: No rashes, lesions or ulcers Psychiatry: Judgement and insight appear normal. Mood & affect appropriate.     Data Reviewed: I have personally reviewed following labs and imaging studies  CBC: Recent Labs  Lab 10/11/23 0117 10/11/23 0817 10/11/23 1510 10/11/23 2306 10/12/23 0425 10/12/23 0812  WBC 12.9*  --   --   --  11.1*  --   NEUTROABS 8.0*  --   --   --   --   --   HGB 12.0 10.8* 10.8* 11.3* 11.6* 11.6*  HCT 36.6 34.0* 35.0* 35.5* 36.7 36.8  MCV 83.6  --   --   --  87.2  --   PLT 288  --   --   --  317  --     Basic Metabolic Panel: Recent Labs  Lab 10/11/23 0231 10/12/23 0425  NA 139 138  K 3.6 3.7  CL 102 104  CO2 25 25  GLUCOSE 142* 100*  BUN 14 10  CREATININE 1.27* 1.22*  CALCIUM  9.4 9.3    GFR: Estimated Creatinine Clearance: 53.4 mL/min (A) (by C-G formula based on SCr of 1.22 mg/dL (H)).  Liver Function Tests: Recent Labs  Lab 10/11/23 0231  AST 30  ALT 36  ALKPHOS 139*  BILITOT <0.2  PROT 7.3  ALBUMIN 3.9    CBG: Recent Labs  Lab 10/11/23 1632 10/11/23 2019 10/11/23 2346 10/12/23 0402 10/12/23 0817  GLUCAP 200* 102* 101* 91 111*     No results found for this or any previous visit (from the past 240 hours).       Radiology Studies: CT ABDOMEN PELVIS WO CONTRAST Result Date: 10/11/2023 CLINICAL DATA:  Bowel obstruction suspected. EXAM: CT ABDOMEN AND PELVIS WITHOUT CONTRAST TECHNIQUE: Multidetector CT imaging of the abdomen and pelvis was performed following the standard protocol without IV contrast. RADIATION DOSE REDUCTION: This exam was performed according to the departmental dose-optimization program which includes automated exposure control, adjustment of the mA and/or kV according to patient size and/or use of iterative reconstruction  technique. COMPARISON:  CT scan abdomen and pelvis from 01/14/2022. FINDINGS: Lower chest: The lung bases are clear. No pleural effusion. The heart is normal in size. No pericardial effusion. Hepatobiliary: The liver is normal in size. Non-cirrhotic configuration. No suspicious mass. No intrahepatic or extrahepatic bile duct dilation. Gallbladder is surgically absent. Pancreas: Unremarkable. No pancreatic ductal dilatation or surrounding inflammatory changes. Spleen: Within normal limits. No focal lesion. Adrenals/Urinary Tract: Unremarkable right adrenal gland. Stable left adrenal adenoma measuring approximately 10 x 20 mm. No suspicious renal mass within limitations of this unenhanced exam. There are stable single simple cyst each in bilateral kidneys with largest arising from the left kidney upper pole, posteriorly measuring 1.8 x 2.2 cm. No nephroureterolithiasis or obstructive uropathy. Urinary bladder is under distended, precluding optimal assessment. However, no large mass or stones identified. No perivesical fat stranding. Stomach/Bowel: No disproportionate dilation of the small or large bowel loops. No evidence of abnormal bowel wall thickening or inflammatory changes. The  appendix is unremarkable. Vascular/Lymphatic: No ascites or pneumoperitoneum. No abdominal or pelvic lymphadenopathy, by size criteria. No aneurysmal dilation of the major abdominal arteries. There are mild peripheral atherosclerotic vascular calcifications of the aorta and its major branches. Reproductive: The uterus is surgically absent. No large adnexal mass. Other: Redemonstration of several supraumbilical paramedian fat containing ventral hernias. There are also small fat containing umbilical and bilateral inguinal hernias. The soft tissues and abdominal wall are otherwise unremarkable. Musculoskeletal: No suspicious osseous lesions. There are mild multilevel degenerative changes in the visualized spine. IMPRESSION: 1. No acute  inflammatory process identified within the abdomen or pelvis. No bowel obstruction. 2. Multiple other nonacute observations, as described above. Aortic Atherosclerosis (ICD10-I70.0). Electronically Signed   By: Ree Molt M.D.   On: 10/11/2023 11:53        Scheduled Meds:  [MAR Hold] amLODipine   10 mg Oral Daily   [MAR Hold] atenolol   50 mg Oral BID   [MAR Hold] busPIRone   15 mg Oral BID   [MAR Hold] calcium  carbonate  1 tablet Oral TID WC   [MAR Hold] escitalopram   10 mg Oral Daily   [MAR Hold] fluticasone  furoate-vilanterol  1 puff Inhalation Daily   [MAR Hold] insulin  aspart  0-15 Units Subcutaneous Q4H   [MAR Hold] losartan   50 mg Oral Daily   [MAR Hold] pantoprazole  (PROTONIX ) IV  40 mg Intravenous Q12H   [MAR Hold] polyethylene glycol-electrolytes  2,000 mL Oral Once   [MAR Hold] rosuvastatin   20 mg Oral Daily   [MAR Hold] spironolactone   25 mg Oral Daily   Continuous Infusions:  sodium chloride  20 mL/hr at 10/11/23 1759   sodium chloride  10 mL/hr at 10/11/23 1447   sodium chloride  500 mL (10/12/23 1112)     LOS: 0 days    Time spent: 35 minutes    Toribio Hummer, MD [facility]   To contact the attending provider between 7A-7P or the covering provider during after hours 7P-7A, please log into the web site [email or web address] and access using universal [place] password for that web site. If you do not have the password, please call the hospital operator.  10/12/2023, 11:41 AM

## 2023-10-12 NOTE — Progress Notes (Signed)
 Pt was witness on phone yelling and screaming  with family  on the phone her female friend is sitting in recliner next to her

## 2023-10-12 NOTE — Interval H&P Note (Signed)
 History and Physical Interval Note:  10/12/2023 11:27 AM  Cheyenne Alvarez  has presented today for surgery, with the diagnosis of Hematemesis, epigastric abdominal pain, anemia.  The various methods of treatment have been discussed with the patient and family. After consideration of risks, benefits and other options for treatment, the patient has consented to  Procedure(s): EGD (ESOPHAGOGASTRODUODENOSCOPY) (N/A) COLONOSCOPY (N/A) as a surgical intervention.  [name] history has been reviewed, patient examined, no change in status, stable for surgery.  I have reviewed the patient's chart and labs.  Questions were answered to the patient's satisfaction.     Kalel Harty

## 2023-10-12 NOTE — Op Note (Signed)
 [facility] Patient Name: Cheyenne Alvarez Procedure Date: 10/12/2023 MRN: [id number] Attending MD: Layla Lah , MD, [id number] Date of Birth: 1965/10/28 CSN: [id number] Age: 58 Admit Type: Inpatient Procedure:                Colonoscopy Indications:              Iron  deficiency anemia Providers:                Layla Lah, MD, Willy Hummer, RN, Felice Sar, Technician Referring MD:              Medicines:                Sedation Administered by an Anesthesia Professional Complications:            No immediate complications. Estimated Blood Loss:     Estimated blood loss was minimal. Procedure:                Pre-Anesthesia Assessment:                           - Prior to the procedure, a History and Physical                            was performed, and patient medications and                            allergies were reviewed. [name] tolerance of                            previous anesthesia was also reviewed. The risks                            and benefits of the procedure and the sedation                            options and risks were discussed with the patient.                            All questions were answered, and informed consent                            was obtained. Prior Anticoagulants: The patient has                            taken no anticoagulant or antiplatelet agents. ASA                            Grade Assessment: III - A patient with severe                            systemic disease. After reviewing the risks and  benefits, the patient was deemed in satisfactory                            condition to undergo the procedure.                           - Prior to the procedure, a History and Physical                            was performed, and patient medications and                            allergies were reviewed. [name] tolerance of                             previous anesthesia was also reviewed. The risks                            and benefits of the procedure and the sedation                            options and risks were discussed with the patient.                            All questions were answered, and informed consent                            was obtained. Prior Anticoagulants: The patient has                            taken no anticoagulant or antiplatelet agents. ASA                            Grade Assessment: III - A patient with severe                            systemic disease. After reviewing the risks and                            benefits, the patient was deemed in satisfactory                            condition to undergo the procedure.                           After obtaining informed consent, the colonoscope                            was passed under direct vision. Throughout the                            procedure, the patient's blood pressure, pulse, and  oxygen saturations were monitored continuously. The                            [id number] ([id number] Olympus colonscope was                            introduced through the anus and advanced to the the                            terminal ileum, with identification of the                            appendiceal orifice and IC valve. The ileocecal                            valve, appendiceal orifice, and rectum were                            photographed. The quality of the bowel preparation                            was fair. Scope In: 11:52:54 AM Scope Out: 12:03:38 PM Scope Withdrawal Time: 0 hours 7 minutes 9 seconds  Total Procedure Duration: 0 hours 10 minutes 44 seconds  Findings:      Hemorrhoids were found on perianal exam.      The terminal ileum appeared normal.      A 3 mm polyp was found in the sigmoid colon. The polyp was sessile.       Biopsies were taken with a cold forceps for histology.      A few  small-mouthed diverticula were found in the sigmoid colon.      Internal hemorrhoids were found during retroflexion. The hemorrhoids       were small. Impression:               - Preparation of the colon was fair.                           - Hemorrhoids found on perianal exam.                           - The examined portion of the ileum was normal.                           - One 3 mm polyp in the sigmoid colon. Biopsied.                           - Diverticulosis in the sigmoid colon.                           - Internal hemorrhoids. Moderate Sedation:      Moderate (conscious) sedation was personally administered by an       anesthesia professional. The following parameters were monitored: oxygen       saturation, heart rate, blood pressure, and response to care. Recommendation:           -  Patient has a contact number available for                            emergencies. The signs and symptoms of potential                            delayed complications were discussed with the                            patient. Return to normal activities tomorrow.                            Written discharge instructions were provided to the                            patient.                           - Resume previous diet.                           - Continue present medications.                           - Await pathology results. Procedure Code(s):        --- Professional ---                           [phone], Colonoscopy, flexible; with biopsy, single                            or multiple Diagnosis Code(s):        --- Professional ---                           K64.8, Other hemorrhoids                           D12.5, Benign neoplasm of sigmoid colon                           D50.9, Iron  deficiency anemia, unspecified                           K57.30, Diverticulosis of large intestine without                            perforation or abscess without bleeding CPT copyright 2022 American  Medical Association. All rights reserved. The codes documented in this report are preliminary and upon coder review may  be revised to meet current compliance requirements. Layla Lah, MD Layla Lah, MD 10/12/2023 12:23:38 PM Number of Addenda: 0

## 2023-10-12 NOTE — Anesthesia Preprocedure Evaluation (Addendum)
 Anesthesia Evaluation  Patient identified by MRN, date of birth, ID band Patient awake    Reviewed: Allergy & Precautions, NPO status , Patient's Chart, lab work & pertinent test results  Airway Mallampati: I  TM Distance: >3 FB Neck ROM: Full    Dental  (+) Edentulous Lower, Edentulous Upper   Pulmonary COPD,  COPD inhaler, former smoker   Pulmonary exam normal        Cardiovascular hypertension, Pt. on home beta blockers + CAD  Normal cardiovascular exam     Neuro/Psych  Headaches PSYCHIATRIC DISORDERS Anxiety Depression Bipolar Disorder Schizophrenia     GI/Hepatic Neg liver ROS,GERD  Medicated and Controlled,,  Endo/Other  diabetes, Oral Hypoglycemic Agents  Patient on GLP-1 Agonist  Renal/GU Renal disease     Musculoskeletal negative musculoskeletal ROS (+)    Abdominal  (+) + obese  Peds  Hematology negative hematology ROS (+)   Anesthesia Other Findings Hematemesis, epigastric abdominal pain, anemia  Reproductive/Obstetrics                              Anesthesia Physical Anesthesia Plan  ASA: 3  Anesthesia Plan: MAC   Post-op Pain Management:    Induction:   PONV Risk Score and Plan: 2 and Propofol  infusion and Treatment may vary due to age or medical condition  Airway Management Planned: Simple Face Mask  Additional Equipment:   Intra-op Plan:   Post-operative Plan:   Informed Consent: I have reviewed the patients History and Physical, chart, labs and discussed the procedure including the risks, benefits and alternatives for the proposed anesthesia with the patient or authorized representative who has indicated his/her understanding and acceptance.       Plan Discussed with: CRNA  Anesthesia Plan Comments:          Anesthesia Quick Evaluation

## 2023-10-12 NOTE — Progress Notes (Signed)
 Pt was due to finis her bowel prep, but pt refused to take the second half of I contacted GI and they said they wanted her to drink it but it was her choice and let her know she may have to repeat this all over again due to her refusal of the second half of bowel prep

## 2023-10-12 NOTE — Discharge Summary (Signed)
 Physician Discharge Summary  Cheyenne Alvarez FMW:[id number] DOB: September 17, 1965 DOA: 10/11/2023  PCP: Cheyenne Alm RAMAN, PA-C  Admit date: 10/11/2023 Discharge date: 10/12/2023  Time spent: 60 minutes  Recommendations for Outpatient Follow-up:  Follow-up with Cheyenne Alvarez, gastroenterology in 3 to 4 weeks. Follow-up with Cheyenne Alm RAMAN, PA-C in 2 weeks.  On follow-up patient will need a CBC done to follow-up on counts.  Patient also need a basic metabolic profile done to follow-up on electrolytes and renal function.    Discharge Diagnoses:  Principal Problem:   GI bleeding Active Problems:   BIPOLAR AFFECTIVE DISORDER   Generalized anxiety disorder   GERD   Uncontrolled type 2 diabetes mellitus with hyperglycemia (HCC)   Essential hypertension   Schizoaffective disorder, bipolar type (HCC)   Chronic obstructive pulmonary disease (HCC)   Hyperlipidemia associated with type 2 diabetes mellitus (HCC)   Hematemesis   Acute gastritis without hemorrhage   Discharge Condition: Stable and improved.  Diet recommendation: Heart healthy  Filed Weights   10/11/23 0335 10/11/23 0519 10/12/23 1106  Weight: 92.7 kg 91.6 kg 91.6 kg    History of present illness:  HPI per Dr. Zella Roderick Emiliya Alvarez is a 58 y.o. female with medical history significant for bipolar 1 disorder, COPD on room air, iron  deficiency anemia, CAD, depression, known ventral hernia admitted to the hospital with hematemesis abdominal pain and constipation.  The patient told her that she had a EGD and colonoscopy with Cheyenne Alvarez about 6 years ago, she was scheduled for an outpatient EGD and colonoscopy today 8/13.  States that over the last few weeks, the bump in the middle of her abdomen has become larger and more tender to palpation.  She started taking her suppositories and bowel prep yesterday as instructed in preparation for colonoscopy today, but she did not have any significant bowel movement.  Instead,  she started to feel very bloated/distended, severe heartburn symptoms, and nausea.  In the evening, she had a large amount of emesis at home, with streaks of blood.  She then had some small hard bowel movements.  She presented to the emergency department where initial workup was relatively benign, initial hemoglobin 12.0, hemodynamically stable.  She had stool guaiac which was negative for occult blood.  Repeat hemoglobin this morning is down slightly to 10.8.  States that she has had no further bowel movement or flatus this morning.  Currently she denies any significant nausea, or heartburn symptoms.   Hospital Course:  #1 hematemesis/acute blood loss anemia -Patient had presented with nausea vomiting abdominal pain and constipation while trying to perform bowel prep at home for EGD colonoscopy in the outpatient setting. - Etiology of hematemesis felt likely to be a Mallory-Weiss tear versus PUD versus gastritis/esophagitis. - Patient with no further hematemesis during the hospitalization and stated clinical improvement on day of discharge.   - Hemoglobin remained stable at 11.6.   - Patient was maintained on IV PPI twice daily.   - Patient was seen in consultation by GI and patient subsequently underwent upper endoscopy and colonoscopy on 10/12/2023.   - EGD showed gastritis, mild esophagitis and duodenitis and few likely fundic gland gastric polyps with biopsies taken.   - Colonoscopy showed a fair prep, normal TI, small sigmoid colon polyp, diverticulosis and hemorrhoids.  - GI recommended PPI daily for reflux and gastritis, sucralfate  1 g twice daily x 4 weeks.  - Per GI patient will likely need repeat colonoscopy in 3 to 5 years because of  fair prep depending on biopsy findings.  - Patient cleared by GI for discharge.  - Patient will follow-up with primary gastroenterologist, Cheyenne Alvarez in 3 to 4 weeks.    2.  Abdominal pain with constipation -Per admitting physician patient noted to have a  ventral hernia that was reducible however presented with severe abdominal pain without any bowel movement.  - Patient clinically improved denies any further abdominal pain and noted to have BM with bowel prep that was administered during the hospitalization for colonoscopy. - CT abdomen and pelvis done without contrast with no acute abnormalities noted. - Patient subsequently underwent EGD/colonoscopy on 10/12/2023 with results as stated above.   - Patient improved clinically will be discharged in stable and improved condition.    3.  COPD -Stable.   4.  Non-insulin -dependent type 2 diabetes mellitus -Hemoglobin A1c 6.8. - Patient maintained on sliding scale insulin  throughout the hospitalization.    5.  Hypertension - Patient maintained on home regimen of Cozaar , spironolactone , Norvasc , atenolol .   6.  Hyperlipidemia - Patient maintained on statin.   7.  Bipolar disorder/schizoaffective disorder/depression/generalized anxiety disorder - Patient maintained on home regimen Lexapro , BuSpar .   8.  GERD - Patient maintained on PPI.    Procedures: CT abdomen and pelvis 10/11/2023 EGD/colonoscopy 10/12/2023  Consultations: Gastroenterology: CheyenneBrahmbhatt 10/11/2023  Discharge Exam: Vitals:   10/12/23 1226 10/12/23 1350  BP: 132/87 (!) 167/78  Pulse: 72 62  Resp: (!) 27 18  Temp:  98.2 F (36.8 C)  SpO2: 100% 100%    General: NAD Cardiovascular: RRR no murmurs rubs or gallops.  No JVD.  No lower extremity edema. Respiratory: Clear to auscultation bilaterally.  No wheezes, no crackles, no rhonchi.  Fair air movement.  Speaking in full sentences.  Discharge Instructions   Discharge Instructions     Diet - low sodium heart healthy   Complete by: As directed    Soft diet   Increase activity slowly   Complete by: As directed       Allergies as of 10/12/2023       Reactions   Dexilant [dexlansoprazole] Shortness Of Breath, Diarrhea, Nausea And Vomiting, Other (See  Comments)   Chest pain and abdominal pain (also)   Famotidine Anaphylaxis   Meperidine Hcl Anaphylaxis   Dilaudid  [hydromorphone  Hcl] Other (See Comments)   Change in mental state   Gabapentin  Other (See Comments)   Memory loss   Hydrocodone  Nausea And Vomiting   Ramipril Swelling, Other (See Comments)   Angioedema   Ziprasidone Hcl Other (See Comments)   Caused convulsions   Invokana [canagliflozin] Rash   Iodinated Contrast Media Itching, Rash   Iohexol  Itching, Rash   Tape Rash   Use paper tape only        Medication List     STOP taking these medications    lamoTRIgine Starter Kit-Orange 42 x 25 MG & 7 x 100 MG Kit       TAKE these medications    Accu-Chek Guide Test test strip Generic drug: glucose blood TEST 1 TO 2 TIMES DAILY   Accu-Chek Softclix Lancets lancets TEST 1 TO 2 TIMES DAILY   acetaminophen -codeine  300-30 MG tablet Commonly known as: TYLENOL  #3 Take 1 tablet by mouth 2 (two) times daily as needed for moderate pain (pain score 4-6). What changed: when to take this   amitriptyline  75 MG tablet Commonly known as: ELAVIL  TAKE 1 TABLET(75 MG) BY MOUTH AT BEDTIME AS NEEDED FOR SLEEP What changed: See the  new instructions.   amLODipine  10 MG tablet Commonly known as: NORVASC  Take 1 tablet (10 mg total) by mouth daily.   atenolol  50 MG tablet Commonly known as: TENORMIN  Take 1 tablet by mouth twice daily   Belsomra 20 MG Tabs Generic drug: Suvorexant Take 20 mg by mouth daily.   busPIRone  15 MG tablet Commonly known as: BUSPAR  Take 15 mg by mouth 2 (two) times daily.   clotrimazole -betamethasone  cream Commonly known as: LOTRISONE  Apply 1 Application topically daily.   escitalopram  10 MG tablet Commonly known as: LEXAPRO  Take 10 mg by mouth daily.   ferrous sulfate  325 (65 FE) MG EC tablet Take 1 tablet (325 mg total) by mouth daily.   fluticasone -salmeterol 250-50 MCG/ACT Aepb Commonly known as: ADVAIR Inhale 1 puff twice  daily, morning & at bedtime   glimepiride  2 MG tablet Commonly known as: AMARYL  Take 1 tablet (2 mg total) by mouth daily with breakfast.   Ingrezza 80 MG capsule Generic drug: valbenazine Take 80 mg by mouth daily.   losartan  50 MG tablet Commonly known as: COZAAR  Take 50 mg by mouth daily.   Mounjaro  15 MG/0.5ML Pen Generic drug: tirzepatide  ADMINISTER 15 MG UNDER THE SKIN 1 TIME A WEEK What changed: See the new instructions.   nitroGLYCERIN 0.4 MG SL tablet Commonly known as: NITROSTAT Place 0.4 mg under the tongue every 5 (five) minutes as needed for chest pain. Reported on 07/15/2015   pantoprazole  40 MG tablet Commonly known as: PROTONIX  Take 1 capsule by mouth twice daily   ramelteon 8 MG tablet Commonly known as: ROZEREM Take 8 mg by mouth at bedtime as needed for sleep.   rosuvastatin  20 MG tablet Commonly known as: CRESTOR  Take 1 tablet (20 mg total) by mouth daily.   spironolactone  25 MG tablet Commonly known as: ALDACTONE  Take 25 mg by mouth daily.   sucralfate  1 g tablet Commonly known as: Carafate  Take 1 tablet (1 g total) by mouth 2 (two) times daily for 28 days. What changed: when to take this   traZODone  100 MG tablet Commonly known as: DESYREL  Take 100 mg by mouth daily as needed.   Ventolin  HFA 108 (90 Base) MCG/ACT inhaler Generic drug: albuterol  Inhale 2 puffs into the lungs every 6 (six) hours as needed for wheezing or shortness of breath.   Vitamin D3 50 MCG (2000 UT) capsule Take 1 capsule by mouth every day   Vraylar 6 MG Caps Generic drug: Cariprazine HCl Take 1 capsule by mouth daily.       Allergies  Allergen Reactions   Dexilant [Dexlansoprazole] Shortness Of Breath, Diarrhea, Nausea And Vomiting and Other (See Comments)    Chest pain and abdominal pain (also)   Famotidine Anaphylaxis   Meperidine Hcl Anaphylaxis   Dilaudid  [Hydromorphone  Hcl] Other (See Comments)    Change in mental state   Gabapentin  Other (See  Comments)    Memory loss   Hydrocodone  Nausea And Vomiting   Ramipril Swelling and Other (See Comments)    Angioedema    Ziprasidone Hcl Other (See Comments)    Caused convulsions   Invokana [Canagliflozin] Rash   Iodinated Contrast Media Itching and Rash   Iohexol  Itching and Rash   Tape Rash    Use paper tape only    Follow-up Information     Alvarez Specking, MD. Schedule an appointment as soon as possible for a visit in 3 week(s).   Specialty: Gastroenterology Why: Follow-up in 3 to 4 weeks. Contact information: [address] [place] [address] [phone]         Tysinger, Alm RAMAN, PA-C. Schedule an appointment as soon as possible for a visit in 2 week(s).   Specialty: Family Medicine Contact information: [address] [place] [address] [phone]                  The results of significant diagnostics from this hospitalization (including imaging, microbiology, ancillary and laboratory) are listed below for reference.    Significant Diagnostic Studies: CT ABDOMEN PELVIS WO CONTRAST Result Date: 10/11/2023 CLINICAL DATA:  Bowel obstruction suspected. EXAM: CT ABDOMEN AND PELVIS WITHOUT CONTRAST TECHNIQUE: Multidetector CT imaging of the abdomen and pelvis was performed following the standard protocol without IV contrast. RADIATION DOSE REDUCTION: This exam was performed according to the departmental dose-optimization program which includes automated exposure control, adjustment of the mA and/or kV according to patient size and/or use of iterative reconstruction technique. COMPARISON:  CT scan abdomen and pelvis from 01/14/2022. FINDINGS: Lower chest: The lung bases are clear. No pleural effusion. The heart is normal in size. No pericardial effusion. Hepatobiliary: The liver is normal in size. Non-cirrhotic configuration. No suspicious mass. No intrahepatic or extrahepatic bile duct dilation. Gallbladder is surgically absent. Pancreas:  Unremarkable. No pancreatic ductal dilatation or surrounding inflammatory changes. Spleen: Within normal limits. No focal lesion. Adrenals/Urinary Tract: Unremarkable right adrenal gland. Stable left adrenal adenoma measuring approximately 10 x 20 mm. No suspicious renal mass within limitations of this unenhanced exam. There are stable single simple cyst each in bilateral kidneys with largest arising from the left kidney upper pole, posteriorly measuring 1.8 x 2.2 cm. No nephroureterolithiasis or obstructive uropathy. Urinary bladder is under distended, precluding optimal assessment. However, no large mass or stones identified. No perivesical fat stranding. Stomach/Bowel: No disproportionate dilation of the small or large bowel loops. No evidence of abnormal bowel wall thickening or inflammatory changes. The appendix is unremarkable. Vascular/Lymphatic: No ascites or pneumoperitoneum. No abdominal or pelvic lymphadenopathy, by size criteria. No aneurysmal dilation of the major abdominal arteries. There are mild peripheral atherosclerotic vascular calcifications of the aorta and its major branches. Reproductive: The uterus is surgically absent. No large adnexal mass. Other: Redemonstration of several supraumbilical paramedian fat containing ventral hernias. There are also small fat containing umbilical and bilateral inguinal hernias. The soft tissues and abdominal wall are otherwise unremarkable. Musculoskeletal: No suspicious osseous lesions. There are mild multilevel degenerative changes in the visualized spine. IMPRESSION: 1. No acute inflammatory process identified within the abdomen or pelvis. No bowel obstruction. 2. Multiple other nonacute observations, as described above. Aortic Atherosclerosis (ICD10-I70.0). Electronically Signed   By: Ree Molt M.D.   On: 10/11/2023 11:53    Microbiology: No results found for this or any previous visit (from the past 240 hours).   Labs: Basic Metabolic  Panel: Recent Labs  Lab 10/11/23 0231 10/12/23 0425  NA 139 138  K 3.6 3.7  CL 102 104  CO2 25 25  GLUCOSE 142* 100*  BUN 14 10  CREATININE 1.27* 1.22*  CALCIUM  9.4 9.3   Liver Function Tests: Recent Labs  Lab 10/11/23 0231  AST 30  ALT 36  ALKPHOS 139*  BILITOT <0.2  PROT 7.3  ALBUMIN 3.9   No results for input(s): LIPASE, AMYLASE in the last 168 hours. No results for input(s): AMMONIA in the last 168 hours. CBC: Recent Labs  Lab 10/11/23 0117 10/11/23 0817 10/11/23 1510 10/11/23 2306 10/12/23 0425 10/12/23 0812  WBC 12.9*  --   --   --  11.1*  --   NEUTROABS 8.0*  --   --   --   --   --   HGB 12.0 10.8* 10.8* 11.3* 11.6* 11.6*  HCT 36.6 34.0* 35.0* 35.5* 36.7 36.8  MCV 83.6  --   --   --  87.2  --   PLT 288  --   --   --  317  --    Cardiac Enzymes: No results for input(s): CKTOTAL, CKMB, CKMBINDEX, TROPONINI in the last 168 hours. BNP: BNP (last 3 results) No results for input(s): BNP in the last 8760 hours.  ProBNP (last 3 results) No results for input(s): PROBNP in the last 8760 hours.  CBG: Recent Labs  Lab 10/11/23 1632 10/11/23 2019 10/11/23 2346 10/12/23 0402 10/12/23 0817  GLUCAP 200* 102* 101* 91 111*       Signed:  Toribio Hummer MD.  [facility] 10/12/2023, 3:30 PM

## 2023-10-12 NOTE — Progress Notes (Signed)
 Pt is very anxious due to ger wanting to leave it was explained to her that I have to wait until I have orders she has stated that they released her downstairs in gastroenterology I explained the doctor has to release her. Pt is sitting on bed upset and rocking back and fourth Dr Sebastian has been notified

## 2023-10-13 LAB — SURGICAL PATHOLOGY

## 2023-10-16 ENCOUNTER — Ambulatory Visit: Payer: Self-pay

## 2023-10-16 ENCOUNTER — Encounter (HOSPITAL_COMMUNITY): Payer: Self-pay | Admitting: Gastroenterology

## 2023-10-16 NOTE — Telephone Encounter (Signed)
 FYI Only or Action Required?: FYI only for provider.  Patient was last seen in primary care on 09/06/2023 by Bulah Alm RAMAN, PA-C.  Called Nurse Triage reporting Abdominal Pain.  Symptoms began several weeks ago.  Interventions attempted: Rest, hydration, or home remedies.  Symptoms are: unchanged.  Triage Disposition: See HCP Within 4 Hours (Or PCP Triage)  Patient/caregiver understands and will follow disposition?: No, wishes to speak with PCP Copied from CRM #[id number]. Topic: Clinical - Red Word Triage >> Oct 16, 2023 12:03 PM Graeme ORN wrote: Red Word that prompted transfer to Nurse Triage: Excruciating Stomach pain request tylenol     ----------------------------------------------------------------------- From previous Reason for Contact - Scheduling: Patient/patient representative is calling to schedule an appointment. Refer to attachments for appointment information. Reason for Disposition  [1] MILD-MODERATE pain AND [2] constant AND [3] present > 2 hours  Answer Assessment - Initial Assessment Questions 1. LOCATION: Where does it hurt?     In the middle 2. RADIATION: Does the pain shoot anywhere else? (e.g., chest, back)     Denies 3. ONSET: When did the pain begin? (e.g., minutes, hours or days ago)      3 weeks 4. SUDDEN: Gradual or sudden onset?     gradual 5. PATTERN Does the pain come and go, or is it constant?     intermittent 6. SEVERITY: How bad is the pain?  (e.g., Scale 1-10; mild, moderate, or severe)     severe 7. RECURRENT SYMPTOM: Have you ever had this type of stomach pain before? If Yes, ask: When was the last time? and What happened that time?      Yes, but states that no one told her what it was 8. CAUSE: What do you think is causing the stomach pain? (e.g., gallstones, recent abdominal surgery)     Ulcer and also hx of colitis 9. RELIEVING/AGGRAVATING FACTORS: What makes it better or worse? (e.g., antacids, bending or  twisting motion, bowel movement)     denies 10. OTHER SYMPTOMS: Do you have any other symptoms? (e.g., back pain, diarrhea, fever, urination pain, vomiting)       Denies  Pt offered appt today, declined. Pt requested tomorrow morning for appt, scheduled.  Protocols used: Abdominal Pain - Female-A-AH

## 2023-10-17 ENCOUNTER — Ambulatory Visit (INDEPENDENT_AMBULATORY_CARE_PROVIDER_SITE_OTHER): Admitting: Medical

## 2023-10-17 VITALS — BP 146/78 | HR 82 | Ht 63.0 in | Wt 203.0 lb

## 2023-10-17 DIAGNOSIS — R109 Unspecified abdominal pain: Secondary | ICD-10-CM | POA: Diagnosis not present

## 2023-10-17 DIAGNOSIS — I10 Essential (primary) hypertension: Secondary | ICD-10-CM

## 2023-10-17 DIAGNOSIS — K922 Gastrointestinal hemorrhage, unspecified: Secondary | ICD-10-CM

## 2023-10-17 DIAGNOSIS — R110 Nausea: Secondary | ICD-10-CM

## 2023-10-17 DIAGNOSIS — K209 Esophagitis, unspecified without bleeding: Secondary | ICD-10-CM

## 2023-10-17 LAB — BASIC METABOLIC PANEL WITH GFR
BUN/Creatinine Ratio: 13 (ref 9–23)
BUN: 15 mg/dL (ref 6–24)
CO2: 19 mmol/L — ABNORMAL LOW (ref 20–29)
Calcium: 8.8 mg/dL (ref 8.7–10.2)
Chloride: 103 mmol/L (ref 96–106)
Creatinine, Ser: 1.12 mg/dL — ABNORMAL HIGH (ref 0.57–1.00)
Glucose: 134 mg/dL — ABNORMAL HIGH (ref 70–99)
Potassium: 4.2 mmol/L (ref 3.5–5.2)
Sodium: 136 mmol/L (ref 134–144)
eGFR: 57 mL/min/1.73 — ABNORMAL LOW (ref >59)

## 2023-10-17 LAB — CBC
Hematocrit: 35.7 % (ref 34.0–46.6)
Hemoglobin: 11.2 g/dL (ref 11.1–15.9)
MCH: 27.3 pg (ref 26.6–33.0)
MCHC: 31.4 g/dL — ABNORMAL LOW (ref 31.5–35.7)
MCV: 87 fL (ref 79–97)
Platelets: 310 x10E3/uL (ref 150–450)
RBC: 4.1 x10E6/uL (ref 3.77–5.28)
RDW: 14.9 % (ref 11.7–15.4)
WBC: 8.9 x10E3/uL (ref 3.4–10.8)

## 2023-10-17 MED ORDER — ACETAMINOPHEN-CODEINE 300-30 MG PO TABS: 1.0000 | ORAL_TABLET | Freq: Two times a day (BID) | ORAL | 0 refills | Status: DC | PRN

## 2023-10-17 MED ORDER — ONDANSETRON 4 MG PO TBDP: 4.0000 mg | ORAL_TABLET | Freq: Three times a day (TID) | ORAL | 0 refills | Status: DC | PRN

## 2023-10-17 MED ORDER — METOPROLOL SUCCINATE ER 100 MG PO TB24
100.0000 mg | ORAL_TABLET | Freq: Every day | ORAL | 2 refills | Status: DC
Start: 2023-10-17 — End: 2023-12-18

## 2023-10-17 MED ORDER — TIRZEPATIDE 10 MG/0.5ML ~~LOC~~ SOAJ: 10.0000 mg | SUBCUTANEOUS | 0 refills | Status: DC

## 2023-10-17 NOTE — Patient Instructions (Signed)
 I reviewed her hospitalization notes, discharge summary, imaging and studies and endoscopy reports. Labs as below today  Recent chest pain-troponins negative and EKG reviewed from hospitalization.  Chest pain seems be related to the gastritis issues.  Status post hospitalization for GI bleed, abdominal pain, and gastritis.  At this point she still is reporting quite a bit of abdominal pain.  Recent endoscopy showed gastritis, duodenitis We will work to get her back into gastroenterology ASAP Labs as below Continue Protonix  40 mg twice daily We called the pharmacy and they noted that there is no specific interaction with the sucralfate .  I recommend you begin trial of the sucralfate  as this coats the stomach and can help with your pain.  It should be 3 times a day about 30 minutes before meal You have had prior allergies to Dexilant and famotidine Pepcid For the time being eat small portions.  Eat bland easily digestible foods.  Avoid citrus, tomatoes, spicy foods, other acidic foods, greasy foods and big portions. Avoid anti-inflammatories such as ibuprofen , Advil , Motrin  Given the recent symptoms I suspect you need to cut back on Mounjaro  Lets go down to the lower dose Mounjaro  for the time being.  On your next injection date change to10 mg instead of the 15 mg   Nausea  Continue Zofran  for nausea as you seem to get improvement with this.   Hypertension - not controlled, particularly aggravated by pain.   Continue Amlodipine  10mg  daily Continue Spironolactone  25mg  daily On Losartan  50mg  daily, but pending labs if kidney marker still elevated, we may have to modify this STOP Atenolol  and change to Toprol  XL 100mg  daily   Pain Follow-up with Dr. Jeronimo office ASAP.  Call to schedule For the short-term I prescribed a little bit more Tylenol  with codeine 

## 2023-10-17 NOTE — Progress Notes (Signed)
 Subjective:  Cheyenne Alvarez is a 58 y.o. female who presents for Chief Complaint  Patient presents with   Follow-up    Heartburn, nausea, abdominal pain     Here for c/o heartburn, nausea, abdominal pain.  She is accompanied by her female friend today.  She was hospitalized 10/11/2023 through 10/12/2023.  Discharge Diagnoses:  Principal Problem:   GI bleeding Active Problems:   BIPOLAR AFFECTIVE DISORDER   Generalized anxiety disorder   GERD   Uncontrolled type 2 diabetes mellitus with hyperglycemia (HCC)   Essential hypertension   Schizoaffective disorder, bipolar type (HCC)   Chronic obstructive pulmonary disease (HCC)   Hyperlipidemia associated with type 2 diabetes mellitus (HCC)   Hematemesis   Acute gastritis without hemorrhage  Currently she reports ongoing moderate pain.  Is requesting pain medication today and something to help the pain and nausea.  She notes that she does not feel any better from when she left the hospital.  She says she is still having the same symptoms.  She said she left the hospital because she wanted to go home.  She does not have a follow-up scheduled with Dr. Dianna at this time.   Per hospital discharge summary, hospital course included hematemesis, acute blood loss anemia.  She presented with nausea and vomiting abdominal pain and constipation.  Etiology of hematemesis if felt likely to be Mallory-Weiss tear versus PUD vs gastritis/esophagitis.   No further hematemesis during hospitalization and improved next day, hemoglobin stable.  She was put on PPI twice daily.  Had GI consult and had endoscopy and coloscopy 10/12/23.   EGD showed gastritis, mild esophagitis and duodenitis and few likely fundic gland gastric polys.    Colonoscopy showed fair prep, normal TI, small sigmoid colon polyp, diverticulosis and hemorrhoids.  GI recommend PPI daily for reflux and gastritis, sucralfate  1g BID x 4 weeks.  She said that when she went to the pharmacy to  pick up sucralfate  they advised her that she could not take that given interactions with her other medications.  Likely need to repeat colonoscopy in 3-5 years due to fair prep.    Regarding abdominal pain, presented with severe pain, but improved by discharge.  CT abdomen and pelvis didn't reveal acute abnormality.    CBC and BMET advised at follow up.   Advised f/u with GI in 3-4 weeks.   No other aggravating or relieving factors.    No other c/o.  Past Medical History:  Diagnosis Date   Alopecia    Anemia    Anxiety    Bipolar 1 disorder (HCC)    COPD (chronic obstructive pulmonary disease) (HCC)    Coronary artery disease    Depression    Diabetes mellitus 2010   Gastritis    GERD (gastroesophageal reflux disease)    Headache    migraines   Hypertension 2010   Obesity    Pneumonia    PONV (postoperative nausea and vomiting)    PTSD (post-traumatic stress disorder)    Schizophrenia (HCC)    Tobacco use    Current Outpatient Medications on File Prior to Visit  Medication Sig Dispense Refill   Accu-Chek Softclix Lancets lancets TEST 1 TO 2 TIMES DAILY 100 each 1   amitriptyline  (ELAVIL ) 75 MG tablet TAKE 1 TABLET(75 MG) BY MOUTH AT BEDTIME AS NEEDED FOR SLEEP (Patient taking differently: Take 75 mg by mouth at bedtime as needed for sleep.) 30 tablet 3   amLODipine  (NORVASC ) 10 MG tablet Take 1 tablet (  10 mg total) by mouth daily. 90 tablet 1   busPIRone  (BUSPAR ) 15 MG tablet Take 15 mg by mouth 2 (two) times daily.     Cholecalciferol (VITAMIN D3) 50 MCG (2000 UT) capsule Take 1 capsule by mouth every day 30 capsule 11   clotrimazole -betamethasone  (LOTRISONE ) cream Apply 1 Application topically daily. 45 g 3   escitalopram  (LEXAPRO ) 10 MG tablet Take 10 mg by mouth daily.     ferrous sulfate  325 (65 FE) MG EC tablet Take 1 tablet (325 mg total) by mouth daily. 90 tablet 1   fluticasone -salmeterol (ADVAIR) 250-50 MCG/ACT AEPB Inhale 1 puff twice daily, morning & at bedtime  60 each 5   glimepiride  (AMARYL ) 2 MG tablet Take 1 tablet (2 mg total) by mouth daily with breakfast. 90 tablet 1   glucose blood (ACCU-CHEK GUIDE TEST) test strip TEST 1 TO 2 TIMES DAILY 100 strip 1   INGREZZA 80 MG capsule Take 80 mg by mouth daily.     losartan  (COZAAR ) 50 MG tablet Take 50 mg by mouth daily.     nitroGLYCERIN (NITROSTAT) 0.4 MG SL tablet Place 0.4 mg under the tongue every 5 (five) minutes as needed for chest pain. Reported on 07/15/2015     pantoprazole  (PROTONIX ) 40 MG tablet Take 1 capsule by mouth twice daily 60 tablet 11   ramelteon (ROZEREM) 8 MG tablet Take 8 mg by mouth at bedtime as needed for sleep.     rosuvastatin  (CRESTOR ) 20 MG tablet Take 1 tablet (20 mg total) by mouth daily. 90 tablet 1   spironolactone  (ALDACTONE ) 25 MG tablet Take 25 mg by mouth daily.     Suvorexant (BELSOMRA) 20 MG TABS Take 20 mg by mouth daily.     tirzepatide  (MOUNJARO ) 15 MG/0.5ML Pen ADMINISTER 15 MG UNDER THE SKIN 1 TIME A WEEK 6 mL 2   traZODone  (DESYREL ) 100 MG tablet Take 100 mg by mouth daily as needed.     VENTOLIN  HFA 108 (90 Base) MCG/ACT inhaler Inhale 2 puffs into the lungs every 6 (six) hours as needed for wheezing or shortness of breath. 54 g 0   VRAYLAR 6 MG CAPS Take 1 capsule by mouth daily.     No current facility-administered medications on file prior to visit.     The following portions of the patient's history were reviewed and updated as appropriate: allergies, current medications, past family history, past medical history, past social history, past surgical history and problem list.  ROS Otherwise as in subjective above  Objective: BP (!) 146/78 (BP Location: Left Arm, Patient Position: Sitting)   Pulse 82   Ht 5' 3 (1.6 m)   Wt 203 lb (92.1 kg)   SpO2 98%   BMI 35.96 kg/m   BP Readings from Last 3 Encounters:  10/17/23 (!) 146/78  10/12/23 (!) 167/78  09/20/23 (!) 150/81   Wt Readings from Last 3 Encounters:  10/17/23 203 lb (92.1 kg)   10/12/23 201 lb 15.1 oz (91.6 kg)  09/20/23 204 lb 6.4 oz (92.7 kg)    General appearance: alert, no distress, well developed, well nourished Neck: supple, no lymphadenopathy, no thyromegaly, no masses Heart: RRR, normal S1, S2, no murmurs Lungs: CTA bilaterally, no wheezes, rhonchi, or rales Abdomen: +bs, soft, mild epigastric tenderness, otherwise non tender, non distended, no masses, no hepatomegaly, no splenomegaly Pulses: 2+ radial pulses, 2+ pedal pulses, normal cap refill Ext: no edema   Assessment: Encounter Diagnoses  Name Primary?   Abdominal pain,  unspecified abdominal location Yes   Nausea    Gastrointestinal hemorrhage, unspecified gastrointestinal hemorrhage type    Essential hypertension    Esophagitis      Plan: I reviewed her hospitalization notes, discharge summary, imaging and studies and endoscopy reports. Labs as below today  Recent chest pain-troponins negative and EKG reviewed from hospitalization.  Chest pain seems be related to the gastritis issues.  Status post hospitalization for GI bleed, abdominal pain, and gastritis.  At this point she still is reporting quite a bit of abdominal pain.  Recent endoscopy showed gastritis, duodenitis We will work to get her back into gastroenterology ASAP Labs as below Continue Protonix  40 mg twice daily We called the pharmacy and they noted that there is no specific interaction with the sucralfate .  I recommend you begin trial of the sucralfate  as this coats the stomach and can help with your pain.  It should be 3 times a day about 30 minutes before meal You have had prior allergies to Dexilant and famotidine Pepcid For the time being eat small portions.  Eat bland easily digestible foods.  Avoid citrus, tomatoes, spicy foods, other acidic foods, greasy foods and big portions. Avoid anti-inflammatories such as ibuprofen , Advil , Motrin  Given the recent symptoms I suspect you need to cut back on Mounjaro  Lets go down  to the lower dose Mounjaro  for the time being.  On your next injection date change to10 mg instead of the 15 mg   Nausea  Continue Zofran  for nausea as you seem to get improvement with this.   Hypertension - not controlled, particularly aggravated by pain.   Continue Amlodipine  10mg  daily Continue Spironolactone  25mg  daily On Losartan  50mg  daily, but pending labs if kidney marker still elevated, we may have to modify this STOP Atenolol  and change to Toprol  XL 100mg  daily   Pain Follow-up with Dr. Jeronimo office ASAP.  Call to schedule For the short-term I prescribed a little bit more Tylenol  with codeine   Cheyenne Alvarez was seen today for follow-up.  Diagnoses and all orders for this visit:  Abdominal pain, unspecified abdominal location -     CBC -     Basic metabolic panel with GFR  Nausea  Gastrointestinal hemorrhage, unspecified gastrointestinal hemorrhage type -     CBC -     Basic metabolic panel with GFR  Essential hypertension  Esophagitis  Other orders -     metoprolol  succinate (TOPROL  XL) 100 MG 24 hr tablet; Take 1 tablet (100 mg total) by mouth daily. Take with or immediately following a meal. -     ondansetron  (ZOFRAN -ODT) 4 MG disintegrating tablet; Take 1 tablet (4 mg total) by mouth every 8 (eight) hours as needed for nausea or vomiting. -     tirzepatide  (MOUNJARO ) 10 MG/0.5ML Pen; Inject 10 mg into the skin once a week. -     acetaminophen -codeine  (TYLENOL  #3) 300-30 MG tablet; Take 1 tablet by mouth 2 (two) times daily as needed for moderate pain (pain score 4-6).    Follow up: with GI, pending labs

## 2023-10-18 ENCOUNTER — Ambulatory Visit: Payer: Self-pay | Admitting: Medical

## 2023-10-18 NOTE — Progress Notes (Signed)
 Blood counts are improved, kidney marker improving  Continue with the recommendations from yesterday and follow-up with your gastroenterologist ASAP

## 2023-11-15 ENCOUNTER — Other Ambulatory Visit: Payer: Self-pay | Admitting: Medical

## 2023-11-28 ENCOUNTER — Other Ambulatory Visit

## 2023-11-28 DIAGNOSIS — Z23 Encounter for immunization: Secondary | ICD-10-CM | POA: Diagnosis not present

## 2023-12-06 ENCOUNTER — Other Ambulatory Visit: Payer: Self-pay | Admitting: Medical

## 2023-12-12 ENCOUNTER — Ambulatory Visit: Payer: 59

## 2023-12-12 ENCOUNTER — Other Ambulatory Visit: Payer: Self-pay | Admitting: Medical

## 2023-12-12 DIAGNOSIS — Z0000 Encounter for general adult medical examination without abnormal findings: Secondary | ICD-10-CM

## 2023-12-12 MED ORDER — TIRZEPATIDE 10 MG/0.5ML ~~LOC~~ SOAJ: 10.0000 mg | SUBCUTANEOUS | 0 refills | Status: DC

## 2023-12-12 NOTE — Telephone Encounter (Signed)
 Higher dose was sent in August for 15mg .   Pt has an appt next week

## 2023-12-12 NOTE — Telephone Encounter (Signed)
 Copied from CRM [id number]. Topic: Clinical - Medication Refill >> Dec 12, 2023  4:12 PM Amy B wrote: Medication: tirzepatide  (MOUNJARO ) 10 MG/0.5ML Pen  Has the patient contacted their pharmacy? Yes (Agent: If no, request that the patient contact the pharmacy for the refill. If patient does not wish to contact the pharmacy document the reason why and proceed with request.) (Agent: If yes, when and what did the pharmacy advise?)  This is the patient's preferred pharmacy:   [facility] [phone] - [place], [place] - [address] AT [facility] [address] [place] [address] Phone: [phone] Fax: [phone]  Is this the correct pharmacy for this prescription? Yes If no, delete pharmacy and type the correct one.   Has the prescription been filled recently? No  Is the patient out of the medication? Yes  Has the patient been seen for an appointment in the last year OR does the patient have an upcoming appointment? Yes  Can we respond through MyChart? No patient prefers phone call  Agent: Please be advised that Rx refills may take up to 3 business days. We ask that you follow-up with your pharmacy.

## 2023-12-12 NOTE — Progress Notes (Signed)
 Subjective:   Cheyenne Alvarez is a 59 y.o. who presents for a Medicare Wellness preventive visit.  As a reminder, Annual Wellness Visits don't include a physical exam, and some assessments may be limited, especially if this visit is performed virtually. We may recommend an in-person follow-up visit with your provider if needed.  Visit Complete: Virtual I connected with  Cheyenne Alvarez on 12/12/23 by a audio enabled telemedicine application and verified that I am speaking with the correct person using two identifiers.  Patient Location: Home  Provider Location: Office/Clinic  I discussed the limitations of evaluation and management by telemedicine. The patient expressed understanding and agreed to proceed.  Vital Signs: Because this visit was a virtual/telehealth visit, some criteria may be missing or patient reported. Any vitals not documented were not able to be obtained and vitals that have been documented are patient reported.  VideoError- Librarian, academic were attempted between this provider and patient, however failed, due to patient having technical difficulties OR patient did not have access to video capability.  We continued and completed visit with audio only.   Persons Participating in Visit: Patient.  AWV Questionnaire: No: Patient Medicare AWV questionnaire was not completed prior to this visit.  Cardiac Risk Factors include: diabetes mellitus;dyslipidemia;hypertension     Objective:    Today's Vitals   12/12/23 0806  PainSc: 8    There is no height or weight on file to calculate BMI.     12/12/2023    8:24 AM 10/11/2023    6:17 AM 10/11/2023    5:19 AM 10/11/2023    1:12 AM 04/17/2023    2:30 PM 11/22/2022    2:16 PM 11/26/2021   10:25 AM  Advanced Directives  Does Patient Have a Medical Advance Directive? Yes  Yes No No Yes Yes  Type of Estate agent of [place];Living will Healthcare Power of  [employer] of [place];Living will Healthcare Power of [place];Living will  Does patient want to make changes to medical advance directive?  No - Patient declined No - Patient declined      Copy of Healthcare Power of Attorney in Chart? Yes - validated most recent copy scanned in chart (See row information)     Yes - validated most recent copy scanned in chart (See row information) Yes - validated most recent copy scanned in chart (See row information)  Would patient like information on creating a medical advance directive?     No - Patient declined      Current Medications (verified) Outpatient Encounter Medications as of 12/12/2023  Medication Sig   Accu-Chek Softclix Lancets lancets TEST 1 TO 2 TIMES DAILY   amitriptyline  (ELAVIL ) 75 MG tablet TAKE 1 TABLET(75 MG) BY MOUTH AT BEDTIME AS NEEDED FOR SLEEP (Patient taking differently: Take 75 mg by mouth at bedtime as needed for sleep.)   amLODipine  (NORVASC ) 10 MG tablet Take 1 tablet (10 mg total) by mouth daily.   busPIRone  (BUSPAR ) 15 MG tablet Take 15 mg by mouth 2 (two) times daily.   Cholecalciferol (VITAMIN D3) 50 MCG (2000 UT) capsule Take 1 capsule by mouth every day   clotrimazole -betamethasone  (LOTRISONE ) cream Apply 1 Application topically daily.   escitalopram  (LEXAPRO ) 10 MG tablet Take 10 mg by mouth daily.   ferrous sulfate  325 (65 FE) MG EC tablet Take 1 tablet (325 mg total) by mouth daily.   fluticasone -salmeterol (ADVAIR) 250-50 MCG/ACT AEPB Inhale 1 puff twice daily,  morning & at bedtime   glimepiride  (AMARYL ) 2 MG tablet Take 1 tablet (2 mg total) by mouth daily with breakfast.   glucose blood (ACCU-CHEK GUIDE TEST) test strip TEST 1 TO 2 TIMES DAILY   INGREZZA 80 MG capsule Take 80 mg by mouth daily.   losartan  (COZAAR ) 50 MG tablet Take 50 mg by mouth daily.   metoprolol  succinate (TOPROL  XL) 100 MG 24 hr tablet Take 1 tablet (100 mg total) by mouth daily. Take with or immediately following a meal.    nitroGLYCERIN (NITROSTAT) 0.4 MG SL tablet Place 0.4 mg under the tongue every 5 (five) minutes as needed for chest pain. Reported on 07/15/2015   ondansetron  (ZOFRAN -ODT) 4 MG disintegrating tablet Take 1 tablet (4 mg total) by mouth every 8 (eight) hours as needed for nausea or vomiting.   pantoprazole  (PROTONIX ) 40 MG tablet Take 1 capsule by mouth twice daily   ramelteon (ROZEREM) 8 MG tablet Take 8 mg by mouth at bedtime as needed for sleep.   rosuvastatin  (CRESTOR ) 20 MG tablet Take 1 tablet (20 mg total) by mouth daily.   spironolactone  (ALDACTONE ) 25 MG tablet Take 25 mg by mouth daily.   Suvorexant (BELSOMRA) 20 MG TABS Take 20 mg by mouth daily.   tirzepatide  (MOUNJARO ) 10 MG/0.5ML Pen Inject 10 mg into the skin once a week.   VENTOLIN  HFA 108 (90 Base) MCG/ACT inhaler Inhale 2 puffs by mouth every 6 hours as needed for wheezing or shortness of breath   VRAYLAR 6 MG CAPS Take 1 capsule by mouth daily.   acetaminophen -codeine  (TYLENOL  #3) 300-30 MG tablet Take 1 tablet by mouth 2 (two) times daily as needed for moderate pain (pain score 4-6). (Patient not taking: Reported on 12/12/2023)   tirzepatide  (MOUNJARO ) 15 MG/0.5ML Pen ADMINISTER 15 MG UNDER THE SKIN 1 TIME A WEEK (Patient not taking: Reported on 12/12/2023)   traZODone  (DESYREL ) 100 MG tablet Take 100 mg by mouth daily as needed. (Patient not taking: Reported on 12/12/2023)   No facility-administered encounter medications on file as of 12/12/2023.    Allergies (verified) Dexilant [dexlansoprazole], Famotidine, Meperidine hcl, Dilaudid  [hydromorphone  hcl], Gabapentin , Hydrocodone , Ramipril, Ziprasidone hcl, Invokana [canagliflozin], Iodinated contrast media, Iohexol , and Tape   History: Past Medical History:  Diagnosis Date   Alopecia    Anemia    Anxiety    Bipolar 1 disorder (HCC)    COPD (chronic obstructive pulmonary disease) (HCC)    Coronary artery disease    Depression    Diabetes mellitus 2010   Gastritis     GERD (gastroesophageal reflux disease)    Headache    migraines   Hypertension 2010   Obesity    Pneumonia    PONV (postoperative nausea and vomiting)    PTSD (post-traumatic stress disorder)    Schizophrenia (HCC)    Tobacco use    Past Surgical History:  Procedure Laterality Date   ABDOMINAL HYSTERECTOMY     heavy bleeding   BIOPSY OF SKIN SUBCUTANEOUS TISSUE AND/OR MUCOUS MEMBRANE  10/12/2023   Procedure: BIOPSY, SKIN, SUBCUTANEOUS TISSUE, OR MUCOUS MEMBRANE;  Surgeon: Elicia Claw, MD;  Location: [facility];  Service: Gastroenterology;;   BREAST BIOPSY Right 07/21/2022   MM RT BREAST BX W LOC DEV 1ST LESION IMAGE BX SPEC STEREO GUIDE 07/21/2022 [facility]   CARDIAC CATHETERIZATION     CHOLECYSTECTOMY     COLONOSCOPY  05/14/14   hemorrhoids, otherwise normal; Dr. Jerrell Sol   COLONOSCOPY N/A 10/12/2023   Procedure: COLONOSCOPY;  Surgeon: Elicia Claw, MD;  Location: [facility];  Service: Gastroenterology;  Laterality: N/A;   COLONOSCOPY WITH PROPOFOL  N/A 05/14/2014   Procedure: COLONOSCOPY WITH PROPOFOL ;  Surgeon: Jerrell Sol, MD;  Location[facility];  Service: Endoscopy;  Laterality: N/A;   ESOPHAGOGASTRODUODENOSCOPY N/A 10/12/2023   Procedure: EGD (ESOPHAGOGASTRODUODENOSCOPY);  Surgeon: Elicia Claw, MD;  Location: [facility];  Service: Gastroenterology;  Laterality: N/A;   ESOPHAGOGASTRODUODENOSCOPY (EGD) WITH PROPOFOL  N/A 05/14/2014   Procedure: ESOPHAGOGASTRODUODENOSCOPY (EGD) WITH PROPOFOL ;  Surgeon: Jerrell Sol, MD;  Location: [facility] ENDOSCOPY;  Service: Endoscopy;  Laterality: N/A;   FRACTURE SURGERY     left leg   LEG SURGERY     TUBAL LIGATION     Family History  Problem Relation Age of Onset   Breast cancer Maternal Grandmother    Seizures Father    Diabetes Mother    Kidney failure Mother    Cancer Brother    Mental illness Brother    Breast cancer Sister 38   Diabetes Sister    Drug abuse Sister    Hypertension Sister     Diabetes Other    Cancer Other    Hypertension Other    Social History   Socioeconomic History   Marital status: Divorced    Spouse name: Not on file   Number of children: Not on file   Years of education: Not on file   Highest education level: Not on file  Occupational History   Not on file  Tobacco Use   Smoking status: Former    Current packs/day: 0.00    Types: Cigarettes    Quit date: 12/18/2013    Years since quitting: 9.9   Smokeless tobacco: Never  Vaping Use   Vaping status: Never Used  Substance and Sexual Activity   Alcohol use: No    Alcohol/week: 0.0 standard drinks of alcohol   Drug use: No   Sexual activity: Not Currently    Partners: Male    Birth control/protection: Surgical  Other Topics Concern   Not on file  Social History Narrative   Not on file   Social Drivers of Health   Financial Resource Strain: Low Risk  (12/12/2023)   Overall Financial Resource Strain (CARDIA)    Difficulty of Paying Living Expenses: Not hard at all  Food Insecurity: No Food Insecurity (12/12/2023)   Hunger Vital Sign    Worried About Running Out of Food in the Last Year: Never true    Ran Out of Food in the Last Year: Never true  Transportation Needs: No Transportation Needs (12/12/2023)   PRAPARE - Administrator, Civil Service (Medical): No    Lack of Transportation (Non-Medical): No  Physical Activity: Insufficiently Active (12/12/2023)   Exercise Vital Sign    Days of Exercise per Week: 7 days    Minutes of Exercise per Session: 10 min  Stress: Stress Concern Present (12/12/2023)   [employer] of Occupational Health - Occupational Stress Questionnaire    Feeling of Stress: Rather much  Social Connections: Moderately Isolated (12/12/2023)   Social Connection and Isolation Panel    Frequency of Communication with Friends and Family: More than three times a week    Frequency of Social Gatherings with Friends and Family: Once a week     Attends Religious Services: More than 4 times per year    Active Member of [employer] or Organizations: No    Attends Banker Meetings: Never    Marital Status: Divorced  Tobacco Counseling Counseling given: Not Answered    Clinical Intake:  Pre-visit preparation completed: Yes  Pain : 0-10 Pain Score: 8  Pain Type: Chronic pain Pain Location: Breast Pain Orientation: Right Pain Descriptors / Indicators: Dull, Aching Pain Onset: 1 to 4 weeks ago Pain Frequency: Constant     Nutritional Risks: None Diabetes: Yes CBG done?: No Did pt. bring in CBG monitor from home?: No  Lab Results  Component Value Date   HGBA1C 6.8 (H) 10/11/2023   HGBA1C 7.5 03/17/2023   HGBA1C 7.5 (H) 12/21/2022     How often do you need to have someone help you when you read instructions, pamphlets, or other written materials from your doctor or pharmacy?: 1 - Never  Interpreter Needed?: No  Information entered by :: NAllen LPN   Activities of Daily Living     12/12/2023    8:15 AM 10/11/2023    5:19 AM  In your present state of health, do you have any difficulty performing the following activities:  Hearing? 0 0  Vision? 1 0  Comment blurry sometimes   Difficulty concentrating or making decisions? 1 0  Walking or climbing stairs? 1   Dressing or bathing? 1   Doing errands, shopping? 0 0  Preparing Food and eating ? N   Using the Toilet? N   In the past six months, have you accidently leaked urine? Y   Comment wears depends   Do you have problems with loss of bowel control? N   Managing your Medications? N   Comment prepackaged   Managing your Finances? N   Housekeeping or managing your Housekeeping? Y     Patient Care Team: Tysinger, Alm RAMAN, PA-C as PCP - General (Family Medicine) End, Lonni, MD as PCP - Cardiology (Cardiology) Levern Hutching, MD as Consulting Physician (Cardiology)  I have updated your Care Teams any recent Medical Services you may have  received from other providers in the past year.     Assessment:   This is a routine wellness examination for [place].  Hearing/Vision screen Hearing Screening - Comments:: Denies hearing issues Vision Screening - Comments:: Regular eye exams, [facility]   Goals Addressed             This Visit's Progress    Patient Stated       12/12/2023, wants life back       Depression Screen     12/12/2023    8:25 AM 03/23/2023   10:16 AM 12/09/2022   11:46 AM 11/22/2022    2:18 PM 03/22/2022   10:09 AM 11/26/2021   10:27 AM 05/12/2021    8:42 AM  PHQ 2/9 Scores  PHQ - 2 Score 4 6 4  6 6 2   PHQ- 9 Score 11 24 18  22 9 12   Exception Documentation    Patient refusal       Fall Risk     12/12/2023    8:25 AM 10/17/2023   10:18 AM 03/23/2023   10:16 AM 12/09/2022   11:46 AM 11/22/2022    2:17 PM  Fall Risk   Falls in the past year? 0 1 0 0 1  Comment     due to legs  Number falls in past yr: 0 1 0  1  Injury with Fall? 0 1 0  1  Risk for fall due to : Medication side effect;Impaired balance/gait  No Fall Risks  History of fall(s);Impaired mobility;Medication side effect;Impaired balance/gait  Follow up  Falls evaluation completed;Falls prevention discussed Falls evaluation completed Falls evaluation completed  Falls prevention discussed;Falls evaluation completed    MEDICARE RISK AT HOME:  Medicare Risk at Home Any stairs in or around the home?: No If so, are there any without handrails?: No Home free of loose throw rugs in walkways, pet beds, electrical cords, etc?: Yes Adequate lighting in your home to reduce risk of falls?: Yes Life alert?: No Use of a cane, walker or w/c?: No Grab bars in the bathroom?: Yes Shower chair or bench in shower?: Yes Elevated toilet seat or a handicapped toilet?: Yes  TIMED UP AND GO:  Was the test performed?  No  Cognitive Function: 6CIT completed        12/12/2023    8:28 AM 11/22/2022    2:19 PM 11/26/2021   10:31 AM  6CIT Screen   What Year? 0 points 0 points 4 points  What month? 0 points 0 points 0 points  What time? 0 points 3 points 3 points  Count back from 20 4 points 4 points 4 points  Months in reverse 4 points 4 points 4 points  Repeat phrase 8 points 8 points 10 points  Total Score 16 points 19 points 25 points    Immunizations Immunization History  Administered Date(s) Administered   Influenza Whole 02/04/2010   Influenza, Seasonal, Injecte, Preservative Fre 11/04/2022, 11/28/2023   Influenza,inj,Quad PF,6+ Mos 10/27/2020, 11/29/2021   Influenza-Unspecified 11/29/2014, 11/28/2017   Moderna SARS-COV2 Booster Vaccination 03/06/2020   Moderna Sars-Covid-2 Vaccination 05/09/2019, 06/11/2019   PNEUMOCOCCAL CONJUGATE-20 11/29/2021   Pneumococcal Polysaccharide-23 10/27/2020   Td 02/29/2008    Screening Tests Health Maintenance  Topic Date Due   Hepatitis B Vaccines 19-59 Average Risk (1 of 3 - 19+ 3-dose series) Never done   Zoster Vaccines- Shingrix (1 of 2) Never done   Cervical Cancer Screening (HPV/Pap Cotest)  08/03/2012   COVID-19 Vaccine (3 - Moderna risk series) 04/03/2020   OPHTHALMOLOGY EXAM  07/28/2023   FOOT EXAM  09/26/2023   Diabetic kidney evaluation - Urine ACR  03/22/2024   HEMOGLOBIN A1C  04/12/2024   Diabetic kidney evaluation - eGFR measurement  10/16/2024   Medicare Annual Wellness (AWV)  12/11/2024   Mammogram  06/15/2025   Colonoscopy  10/11/2033   Pneumococcal Vaccine: 50+ Years  Completed   Hepatitis C Screening  Completed   HIV Screening  Completed   HPV VACCINES  Aged Out   Meningococcal B Vaccine  Aged Out   DTaP/Tdap/Td  Discontinued   Influenza Vaccine  Discontinued    Health Maintenance Items Addressed: Declines covid vaccine. Has eye appointment coming up.  Additional Screening:  Vision Screening: Recommended annual ophthalmology exams for early detection of glaucoma and other disorders of the eye. Is the patient up to date with their annual eye exam?   No  Who is the provider or what is the name of the office in which the patient attends annual eye exams? [facility]  Dental Screening: Recommended annual dental exams for proper oral hygiene  Community Resource Referral / Chronic Care Management: CRR required this visit?  No   CCM required this visit?  No   Plan:    I have personally reviewed and noted the following in the patient's chart:   Medical and social history Use of alcohol, tobacco or illicit drugs  Current medications and supplements including opioid prescriptions. Patient is not currently taking opioid prescriptions. Functional ability and status Nutritional status Physical activity Advanced directives  List of other physicians Hospitalizations, surgeries, and ER visits in previous 12 months Vitals Screenings to include cognitive, depression, and falls Referrals and appointments  In addition, I have reviewed and discussed with patient certain preventive protocols, quality metrics, and best practice recommendations. A written personalized care plan for preventive services as well as general preventive health recommendations were provided to patient.   Ardella FORBES Dawn, LPN   [id number]   After Visit Summary: (Pick Up) Due to this being a telephonic visit, with patients personalized plan was offered to patient and patient has requested to Pick up at office.  Notes: Nothing significant to report at this time.

## 2023-12-12 NOTE — Patient Instructions (Signed)
 [name],  Thank you for taking the time for your Medicare Wellness Visit. I appreciate your continued commitment to your health goals. Please review the care plan we discussed, and feel free to reach out if I can assist you further.  Medicare recommends these wellness visits once per year to help you and your care team stay ahead of potential health issues. These visits are designed to focus on prevention, allowing your provider to concentrate on managing your acute and chronic conditions during your regular appointments.  Please note that Annual Wellness Visits do not include a physical exam. Some assessments may be limited, especially if the visit was conducted virtually. If needed, we may recommend a separate in-person follow-up with your provider.  Ongoing Care Seeing your primary care provider every 3 to 6 months helps us  monitor your health and provide consistent, personalized care.   Referrals If a referral was made during today's visit and you haven't received any updates within two weeks, please contact the referred provider directly to check on the status.  Recommended Screenings:  Health Maintenance  Topic Date Due   Hepatitis B Vaccine (1 of 3 - 19+ 3-dose series) Never done   Zoster (Shingles) Vaccine (1 of 2) Never done   Pap with HPV screening  08/03/2012   COVID-19 Vaccine (3 - Moderna risk series) 04/03/2020   Eye exam for diabetics  07/28/2023   Complete foot exam   09/26/2023   Yearly kidney health urinalysis for diabetes  03/22/2024   Hemoglobin A1C  04/12/2024   Yearly kidney function blood test for diabetes  10/16/2024   Medicare Annual Wellness Visit  12/11/2024   Breast Cancer Screening  06/15/2025   Colon Cancer Screening  10/11/2033   Pneumococcal Vaccine for age over 67  Completed   Hepatitis C Screening  Completed   HIV Screening  Completed   HPV Vaccine  Aged Out   Meningitis B Vaccine  Aged Out   DTaP/Tdap/Td vaccine  Discontinued   Flu Shot   Discontinued       12/12/2023    8:24 AM  Advanced Directives  Does Patient Have a Medical Advance Directive? Yes  Type of Estate agent of [place];Living will  Copy of Healthcare Power of Attorney in Chart? Yes - validated most recent copy scanned in chart (See row information)   Advance Care Planning is important because it: Ensures you receive medical care that aligns with your values, goals, and preferences. Provides guidance to your family and loved ones, reducing the emotional burden of decision-making during critical moments.  Vision: Annual vision screenings are recommended for early detection of glaucoma, cataracts, and diabetic retinopathy. These exams can also reveal signs of chronic conditions such as diabetes and high blood pressure.  Dental: Annual dental screenings help detect early signs of oral cancer, gum disease, and other conditions linked to overall health, including heart disease and diabetes.  Please see the attached documents for additional preventive care recommendations.

## 2023-12-12 NOTE — Telephone Encounter (Signed)
 Refilled 10mg  and discontinued 15mg  since Cheyenne Alvarez last decreased dose from 15mg  to 10mg  at last viist

## 2023-12-18 ENCOUNTER — Ambulatory Visit (INDEPENDENT_AMBULATORY_CARE_PROVIDER_SITE_OTHER): Admitting: Medical

## 2023-12-18 VITALS — BP 150/70 | HR 68 | Temp 97.8°F | Wt 209.2 lb

## 2023-12-18 DIAGNOSIS — R109 Unspecified abdominal pain: Secondary | ICD-10-CM | POA: Diagnosis not present

## 2023-12-18 DIAGNOSIS — K439 Ventral hernia without obstruction or gangrene: Secondary | ICD-10-CM

## 2023-12-18 DIAGNOSIS — Z794 Long term (current) use of insulin: Secondary | ICD-10-CM

## 2023-12-18 DIAGNOSIS — Z79899 Other long term (current) drug therapy: Secondary | ICD-10-CM

## 2023-12-18 DIAGNOSIS — E1165 Type 2 diabetes mellitus with hyperglycemia: Secondary | ICD-10-CM

## 2023-12-18 DIAGNOSIS — I10 Essential (primary) hypertension: Secondary | ICD-10-CM | POA: Diagnosis not present

## 2023-12-18 DIAGNOSIS — K2100 Gastro-esophageal reflux disease with esophagitis, without bleeding: Secondary | ICD-10-CM

## 2023-12-18 DIAGNOSIS — R1084 Generalized abdominal pain: Secondary | ICD-10-CM

## 2023-12-18 MED ORDER — AMLODIPINE-OLMESARTAN 10-40 MG PO TABS: 1.0000 | ORAL_TABLET | Freq: Every day | ORAL | 1 refills | Status: DC

## 2023-12-18 MED ORDER — TIRZEPATIDE 12.5 MG/0.5ML ~~LOC~~ SOAJ: 12.5000 mg | SUBCUTANEOUS | 1 refills | Status: DC

## 2023-12-18 MED ORDER — METOPROLOL SUCCINATE ER 100 MG PO TB24: 100.0000 mg | ORAL_TABLET | Freq: Every day | ORAL | 1 refills | Status: DC

## 2023-12-18 MED ORDER — GLIMEPIRIDE 2 MG PO TABS: 2.0000 mg | ORAL_TABLET | Freq: Every day | ORAL | 1 refills | Status: DC

## 2023-12-18 MED ORDER — TRAMADOL HCL 50 MG PO TABS: 50.0000 mg | ORAL_TABLET | Freq: Two times a day (BID) | ORAL | 0 refills | Status: AC | PRN

## 2023-12-18 MED ORDER — FLUTICASONE-SALMETEROL 250-50 MCG/ACT IN AEPB: INHALATION_SPRAY | RESPIRATORY_TRACT | 1 refills | Status: DC

## 2023-12-18 MED ORDER — ONDANSETRON 4 MG PO TBDP: 4.0000 mg | ORAL_TABLET | Freq: Three times a day (TID) | ORAL | 0 refills | Status: DC | PRN

## 2023-12-18 NOTE — Patient Instructions (Signed)
 Your blood pressure is not quite controlled  Recommendations I am making a change in your blood pressure medications today Stop the plain amlodipine  Stop the plain losartan  BEGIN the combination medication Amlodipine  Omesartan and 10/40 mg once daily.  This will take the place of the 2 medications above that we are discontinuing since this is a combination This new amlodipine  olmesartan  will hopefully get your pressure down to goal Bring your blood pressure cuff with you the next time you come in so we can compare blood pressures here in the office Regarding your side pain, if that does not improve over the next 2 weeks we may want to consider a rib x-ray/chest x-ray and possibly breast ultrasound

## 2023-12-18 NOTE — Progress Notes (Signed)
 Sent OV note to Dr. Dianna and Dr. Levern

## 2023-12-18 NOTE — Progress Notes (Signed)
 Subjective:  Cheyenne Alvarez is a 58 y.o. female who presents for Chief Complaint  Patient presents with   Acute Visit    Right breast pain- under armpit and moves up into shoulder area. Feels heavy. Pain x 1 week or more. Needs refills on medications (see pended)     History of Present Illness Cheyenne Alvarez is a 58 year old female accompanied by female partner  She has been experiencing  right-sided side pain for about a week. The pain originates from the right armpit and radiates to the breast and shoulder area. She describes the pain as 'heavy' and 'hurts so bad'. She has had some recent falls, but denies hitting her chest wall or breast.  She has been using Tylenol  and a cream ordered by her daughter for pain relief.  She mentions ongoing stomach issues and a knot that has been enlarging. She was previously taken off Mounjaro  12.5mg  recently given abdominal pain and gastritis, which she associates with weight gain. Currently, she is on a dose of 10 mg and wishes to increase it to 12.5 mg again to manage her weight. Her appetite has increased since the dose was reduced.  She has a history of high blood pressure, which she monitors at home, often recording readings of 160-170 mmHg. She is on multiple antihypertensive medications, including Toprol  XL 100 mg, amlodipine  10 mg, losartan  50 mg, and spironolactone . She is concerned about her blood pressure being 'dangerous' due to increased physical activity and work.  She has a history of a ventral hernia with fat tissue involvement, as noted in a previous scan. The hernia is causing pain. She also has a history of diabetes and concerns about her weight management.  She recently started a part-time job at [employer], which involves a lot of walking, and she is trying to manage her weight through physical activity. She denies smoking and mentions drinking limited water.  She does note having a bad cold within the last 2 weeks and cough the whole.   She wonders if that is related to her side pain.  Symptoms have much improved in that regard otherwise  No other aggravating or relieving factors.    No other c/o.  Past Medical History:  Diagnosis Date   Alopecia    Anemia    Anxiety    Bipolar 1 disorder (HCC)    COPD (chronic obstructive pulmonary disease) (HCC)    Coronary artery disease    Depression    Diabetes mellitus 2010   Gastritis    GERD (gastroesophageal reflux disease)    Headache    migraines   Hypertension 2010   Obesity    Pneumonia    PONV (postoperative nausea and vomiting)    PTSD (post-traumatic stress disorder)    Schizophrenia (HCC)    Tobacco use    Current Outpatient Medications on File Prior to Visit  Medication Sig Dispense Refill   amitriptyline  (ELAVIL ) 75 MG tablet TAKE 1 TABLET(75 MG) BY MOUTH AT BEDTIME AS NEEDED FOR SLEEP 30 tablet 3   busPIRone  (BUSPAR ) 15 MG tablet Take 15 mg by mouth 2 (two) times daily.     Cholecalciferol (VITAMIN D3) 50 MCG (2000 UT) capsule Take 1 capsule by mouth every day 30 capsule 11   clotrimazole -betamethasone  (LOTRISONE ) cream Apply 1 Application topically daily. 45 g 3   escitalopram  (LEXAPRO ) 10 MG tablet Take 10 mg by mouth daily.     ferrous sulfate  325 (65 FE) MG EC tablet Take 1  tablet (325 mg total) by mouth daily. 90 tablet 1   INGREZZA 80 MG capsule Take 80 mg by mouth daily.     nitroGLYCERIN (NITROSTAT) 0.4 MG SL tablet Place 0.4 mg under the tongue every 5 (five) minutes as needed for chest pain. Reported on 07/15/2015     pantoprazole  (PROTONIX ) 40 MG tablet Take 1 capsule by mouth twice daily 60 tablet 11   ramelteon (ROZEREM) 8 MG tablet Take 8 mg by mouth at bedtime as needed for sleep.     rosuvastatin  (CRESTOR ) 20 MG tablet Take 1 tablet (20 mg total) by mouth daily. 90 tablet 1   spironolactone  (ALDACTONE ) 25 MG tablet Take 25 mg by mouth daily.     Suvorexant (BELSOMRA) 20 MG TABS Take 20 mg by mouth daily.     VENTOLIN  HFA 108 (90 Base)  MCG/ACT inhaler Inhale 2 puffs by mouth every 6 hours as needed for wheezing or shortness of breath 18 each 1   VRAYLAR 6 MG CAPS Take 1 capsule by mouth daily.     Accu-Chek Softclix Lancets lancets TEST 1 TO 2 TIMES DAILY 100 each 1   glucose blood (ACCU-CHEK GUIDE TEST) test strip TEST 1 TO 2 TIMES DAILY 100 strip 1   No current facility-administered medications on file prior to visit.     The following portions of the patient's history were reviewed and updated as appropriate: allergies, current medications, past family history, past medical history, past social history, past surgical history and problem list.  ROS Otherwise as in subjective above    Objective: BP (!) 150/70   Pulse 68   Temp 97.8 F (36.6 C)   Wt 209 lb 3.2 oz (94.9 kg)   SpO2 98%   BMI 37.06 kg/m   General appearance: alert, no distress, well developed, well nourished Heart: RRR, normal S1, S2, no murmurs Lungs: CTA bilaterally, no wheezes, rhonchi, or rales Tender over the right anterior lateral chest wall around T6-T7 level, slight tenderness in the right lateral ribs but it seems to be more rib tenderness than breast, no obvious lump in the lateral breast, axilla nontender no obvious abnormality Abdomen: +bs, soft, fullness and mild tenderness superior to umbilicus, otherwise non tender, non distended, no masses, no hepatomegaly, no splenomegaly Pulses: 2+ radial pulses, 2+ pedal pulses, normal cap refill Ext: no edema   Assessment: Encounter Diagnoses  Name Primary?   Side pain Yes   Ventral hernia without obstruction or gangrene    Essential hypertension    High risk medication use    Type 2 diabetes mellitus with hyperglycemia, with long-term current use of insulin  (HCC)    Gastroesophageal reflux disease with esophagitis without hemorrhage    Generalized abdominal pain      Plan: Right chest wall side pain, questionable breast pain - Prescribed short-term pain relief with tramadol  -  Advised alternating acetaminophen  with prescribed analgesics. - Monitor pain for two weeks. - Consider rib x-ray, chest x-ray, and breast ultrasound if pain persists.  Hypertension Home blood pressure readings high; in-office readings lower. Current medications include Toprol  XL 100 mg daily, amlodipine  continued at 10 mg daily but in the combo pill below, spironolactone  25 mg daily.  Stop losartan  and begin the combo amlodipine  olmesartan  10 mg. - Prescribe amlodipine /olmesartan  10/40 mg once daily. - Advise bringing home blood pressure cuff to next appointment for comparison.  Type 2 diabetes mellitus Increased appetite noted. Current management includes Mounjaro  and glimepiride . - Increase Mounjaro  dosage to 12.5 mg.  Morbid obesity associated with diabetes Weight gain after reducing Mounjaro  dosage. Increased physical activity noted. - Increase Mounjaro  dosage to 12.5 mg. - Encourage continued physical activity.  Abdominal wall hernia with fat content Supra-umbilical paramedian hernia with fat content possibly causing pain. No bowel involvement.  - may need to see general surgeon if pain persists or worsens. Recent hospital in August for abdominal pain and GI bleed-follow-up with gastroenterology this week as planned   Cheyenne Alvarez was seen today for acute visit.  Diagnoses and all orders for this visit:  Side pain  Ventral hernia without obstruction or gangrene  Essential hypertension  High risk medication use  Type 2 diabetes mellitus with hyperglycemia, with long-term current use of insulin  (HCC)  Gastroesophageal reflux disease with esophagitis without hemorrhage  Generalized abdominal pain  Other orders -     ondansetron  (ZOFRAN -ODT) 4 MG disintegrating tablet; Take 1 tablet (4 mg total) by mouth every 8 (eight) hours as needed for nausea or vomiting. -     tirzepatide  (MOUNJARO ) 12.5 MG/0.5ML Pen; Inject 12.5 mg into the skin once a week. -     amLODipine -olmesartan   (AZOR) 10-40 MG tablet; Take 1 tablet by mouth daily. -     metoprolol  succinate (TOPROL  XL) 100 MG 24 hr tablet; Take 1 tablet (100 mg total) by mouth daily. Take with or immediately following a meal. -     glimepiride  (AMARYL ) 2 MG tablet; Take 1 tablet (2 mg total) by mouth daily with breakfast. -     fluticasone -salmeterol (ADVAIR) 250-50 MCG/ACT AEPB; Inhale 1 puff twice daily, morning & at bedtime -     traMADol  (ULTRAM ) 50 MG tablet; Take 1 tablet (50 mg total) by mouth 2 (two) times daily as needed for up to 5 days.    Follow up: pending GI consult this week

## 2024-01-05 ENCOUNTER — Other Ambulatory Visit: Payer: Self-pay | Admitting: Medical

## 2024-02-14 ENCOUNTER — Other Ambulatory Visit: Payer: Self-pay | Admitting: Medical

## 2024-02-26 ENCOUNTER — Ambulatory Visit: Admitting: Medical

## 2024-03-05 ENCOUNTER — Other Ambulatory Visit: Payer: Self-pay | Admitting: Medical

## 2024-03-15 ENCOUNTER — Ambulatory Visit: Payer: Self-pay

## 2024-03-15 NOTE — Telephone Encounter (Signed)
 FYI Only or Action Required?: Action required by provider: update on patient condition.  Patient was last seen in primary care on 12/18/2023 by Bulah Alm RAMAN, PA-C.  Called Nurse Triage reporting Spasms.  Symptoms began several days ago.  Interventions attempted: Rest, hydration, or home remedies.  Symptoms are: gradually worsening.  Triage Disposition: Call PCP Within 24 Hours  Patient/caregiver understands and will follow disposition?: Yes  Message from Siletz R sent at 03/15/2024  4:00 PM EST  Reason for Triage: Patient states she is having painful muscle spasms, where her body is locking up to where she is unable to move. Started in her legs the other day and now is into her back and hands.   Reason for Disposition  Diabetes mellitus or weak immune system (e.g., HIV positive, cancer chemo, splenectomy, organ transplant, chronic steroids)  Answer Assessment - Initial Assessment Questions Cramping and spasms Starting in her hands then up her back and then down her legs. She has had a few spasms in the last 2 days and they are scaring her . She is driving home and wanting someone to stay on the phone with her until she makes it home.  Her mouth is dry and looks white- denies white coating but more dehydrated. She is concerned bc provider has her taking spironolactone  daily and thinks the dehydration is causing cramps and spasms.  She does not have the cramping/spasms currently but feels her hands starting to- she is going to take some mustard and work on getting Gatorade or Liquid IV to help with electrolyte replacement. She just isnt able to drink as much on Mounjaro .   Denies CP, SOB, Dizziness, Fever- no swelling, color or temp change to extremities.   Appt already in place for 1/21- patient to go to ED if spasm happens again. She will keep us  updated.   1. ONSET: When did the muscle aches or body pains start?      Started the last few days  2. LOCATION: What part of  your body is hurting? (e.g., entire body, arms, legs)      Started in hands then down her back and then down her legs  3. SEVERITY: How bad is the pain? (Scale 1-10; or mild, moderate, severe)     severe 4. CAUSE: What do you think is causing the pains?     Dehydration  5. FEVER: Do you have a fever? If Yes, ask: What is your temperature, how was it measured, and  when did it start?      Denies  6. OTHER SYMPTOMS: Do you have any other symptoms? (e.g., chest pain, cold or flu symptoms, rash, weakness, weight loss)     Spasms and cramping  Protocols used: Muscle Aches and Body Pain-A-AH

## 2024-03-20 ENCOUNTER — Ambulatory Visit: Admitting: Medical

## 2024-03-20 ENCOUNTER — Telehealth: Payer: Self-pay | Admitting: Internal Medicine

## 2024-03-20 VITALS — BP 124/80 | HR 86 | Temp 97.2°F | Wt 209.6 lb

## 2024-03-20 DIAGNOSIS — Z794 Long term (current) use of insulin: Secondary | ICD-10-CM

## 2024-03-20 DIAGNOSIS — L089 Local infection of the skin and subcutaneous tissue, unspecified: Secondary | ICD-10-CM | POA: Diagnosis not present

## 2024-03-20 DIAGNOSIS — K2100 Gastro-esophageal reflux disease with esophagitis, without bleeding: Secondary | ICD-10-CM

## 2024-03-20 DIAGNOSIS — R252 Cramp and spasm: Secondary | ICD-10-CM

## 2024-03-20 DIAGNOSIS — R0602 Shortness of breath: Secondary | ICD-10-CM | POA: Diagnosis not present

## 2024-03-20 DIAGNOSIS — F250 Schizoaffective disorder, bipolar type: Secondary | ICD-10-CM | POA: Diagnosis not present

## 2024-03-20 DIAGNOSIS — E1165 Type 2 diabetes mellitus with hyperglycemia: Secondary | ICD-10-CM

## 2024-03-20 DIAGNOSIS — J449 Chronic obstructive pulmonary disease, unspecified: Secondary | ICD-10-CM | POA: Diagnosis not present

## 2024-03-20 DIAGNOSIS — R609 Edema, unspecified: Secondary | ICD-10-CM

## 2024-03-20 DIAGNOSIS — I10 Essential (primary) hypertension: Secondary | ICD-10-CM | POA: Diagnosis not present

## 2024-03-20 DIAGNOSIS — E1169 Type 2 diabetes mellitus with other specified complication: Secondary | ICD-10-CM | POA: Diagnosis not present

## 2024-03-20 DIAGNOSIS — R490 Dysphonia: Secondary | ICD-10-CM

## 2024-03-20 MED ORDER — MUPIROCIN 2 % EX OINT: 1.0000 | TOPICAL_OINTMENT | Freq: Two times a day (BID) | CUTANEOUS | 0 refills | Status: DC

## 2024-03-20 MED ORDER — CYCLOBENZAPRINE HCL 10 MG PO TABS: 10.0000 mg | ORAL_TABLET | Freq: Two times a day (BID) | ORAL | 0 refills | Status: DC | PRN

## 2024-03-20 NOTE — Telephone Encounter (Signed)
 Left message for pt to call us  back.   I called mail order pharmacy and they will not cancel bubble pack as patient is the one that has to call to cancel this.   I have sent a note to Dr. Levern office and to Mliss the Nurse there for them to call back to schedule a visit to go over her bp medications.

## 2024-03-20 NOTE — Telephone Encounter (Signed)
Pt came in today for a visit

## 2024-03-20 NOTE — Progress Notes (Signed)
 "  Name: Cheyenne Alvarez   Date of Visit: 03/20/24   Date of last visit with me: 03/05/2024   CHIEF COMPLAINT:  Chief Complaint  Patient presents with   Follow-up    Lump on breast- but going away Having a lot of muscle cramping/spasms throughout whole body, legs and hands are locking up on her, having some SOB, all started when started taking spirolactone. Took 1-2 Flexeril  10mg  of a friend's that has helped.  Foot/ toes are turning dark- saw podiatry for this and was given medication but not working.        HPI:  Discussed the use of AI scribe software for clinical note transcription with the patient, who gave verbal consent to proceed.  History of Present Illness  History of Present Illness Cheyenne Alvarez is a 59 year old female with diabetes and hypertension who presents with concerns about a breast lump and muscle cramps.  She has a breast lump that was initially large but has been reducing in size without any treatment. The lump was described as 'huge' but is now 'going away pretty good'.  She experiences muscle cramps and episodes where her body 'locks up', affecting her ability to work at [employer], leading to being sent home. Her hands and legs lock up, making it difficult to stand. She suspects her medication regimen, which includes spironolactone , may contribute to these symptoms. Her aide manages her medications. She consumes Gatorade, Pedialyte, and bananas to manage cramps. She takes glimepiride , described as a 'light green pill', for diabetes.  She used some Flexeril  muscle laxer to different tablets from her friend that seem to help.  She would like more these for her own self as they did seem to help her aches  She experiences swelling and shortness of breath when lying flat, requiring her to sleep with four pillows. She has a history of anxiety, which worsens her symptoms during muscle cramp episodes.  She has a hoarse voice and a history of acid reflux, for which  she takes Protonix  twice daily. She mentions a family history of cancer, specifically her grandmother's twin who died of cancer.  She reports darkening and pain in her foot, describing it as 'turning dark'. She has a history of calluses and was previously informed of poor blood circulation in her foot.   Past Medical History:  Diagnosis Date   Alopecia    Anemia    Anxiety    Bipolar 1 disorder (HCC)    COPD (chronic obstructive pulmonary disease) (HCC)    Coronary artery disease    Depression    Diabetes mellitus 2010   Gastritis    GERD (gastroesophageal reflux disease)    Headache    migraines   Hypertension 2010   Obesity    Pneumonia    PONV (postoperative nausea and vomiting)    PTSD (post-traumatic stress disorder)    Schizophrenia (HCC)    Tobacco use    Medications Ordered Prior to Encounter[1]  ROS as in subjective    Objective: BP 124/80   Pulse 86   Temp (!) 97.2 F (36.2 C)   Wt 209 lb 9.6 oz (95.1 kg)   SpO2 100%   BMI 37.13 kg/m   General appearence: alert, no distress, WD/WN,  Skin/breast: Right lower portion of the areola at 8 o'clock position with a small papule approximately 3 mm diameter that appears to be healing compared to what she describes last week as likely small abscess or pustule.  No  other breast lump or obvious abnormality.  Exam chaperoned by nurse Neck: supple, no lymphadenopathy, no thyromegaly, no masses, no JVD Heart: RRR, normal S1, S2, no murmurs Lungs: CTA bilaterally, no wheezes, rhonchi, or rales Pulses: 2+ symmetric, upper and lower extremities, normal cap refill Extremities without obvious edema today or asymmetry    Assessment: Encounter Diagnoses  Name Primary?   Edema, unspecified type Yes   Pustule    Type 2 diabetes mellitus with hyperglycemia, with long-term current use of insulin  (HCC)    Schizoaffective disorder, bipolar type (HCC)    Morbid obesity (HCC)    Hyperlipidemia associated with type 2 diabetes  mellitus (HCC)    Gastroesophageal reflux disease with esophagitis without hemorrhage    Essential hypertension    Chronic obstructive pulmonary disease, unspecified COPD type (HCC)    Muscle cramp    SOB (shortness of breath)    Hoarseness       Plan: The most concerning issue today is her medications.  She sees Dr. Levern for cardiology.  He is not in our computer system as I do not always have access to his notes.  We called over to get a copy of her most recent visit which was July 2025.  Unfortunately she seems to be duplicating medications as we are both prescribing antihypertensives that she is duplicating instead of doing one of the other.  Will also complicates things as some of her pills are prepackaged in a morning and afternoon pill pack that contains multiple medications that she is using in addition to her individual medications in pill bottles  We will touch base with cardiology to sort out her blood pressure situation and in the future she needs to be getting all of her blood pressure medications and heart medications from Dr. Levern so there is no confusion or duplication.  Hypertension -You are unfortunately duplicating medications -We will coordinate with cardiology to make sure they are aware of your current medications and they are going to get you in for a follow-up soon to straighten this out -For now and try to get all of your blood pressure medications and heart medications from Dr. Levern only so we do not have any confusion or duplication of medications -Based on your pill bottles today you are taking both atenolol  50 mg once daily and Toprol  ER 100 mg daily which are the same type of medication being duplicated -You are also currently taking Losartan  50 mg, Amlodipine  10 mg daily, and the combination Amlodipine /Olmesartan  10/40 mg daily which is duplicating both medications -You are taking spironolactone  25 mg daily which you feel like is causing you breast  discomfort and not helping with swelling -Follow-up with cardiology ASAP to let them help you re-tool your blood pressure medications   Skin lesion mostly resolved over right breast-begin mupirocin  ointment topically to the breast for the next 5 to 7 days as well as using the same ointment intranasally 2-3 times a day for a week in the event of any MRSA colonization   Edema-not appreciated on exam today.  Updated labs today as below   Muscle cramps, possible muscle spasm - Prescribed short-term Flexeril  for as needed use at her request -Labs as below   Shortness of breath and reported edema -Labs as below including BNP marker   Schizoaffective disorder, history of depression and anxiety -Continue routine follow-up with psychiatry and continue your current medications per psychiatry -Medications include Lexapro , buspirone , Ingrezza, Vraylar, and ramelteon for sleep -Double check with your  psychiatrist as I also 2 different sleep aids listed in the chart record including ramelteon and Belsomra  Hyperlipidemia/high cholesterol -Continue rosuvastatin  Crestor  20 mg daily -Continue aspirin  81 mg daily   History of gastritis and acid reflux which may be contributing to her hoarseness -Continue Protonix  pantoprazole  40 mg twice daily -Limit acidic and spicy foods   Diabetes -Continue Mounjaro  12.5 mg weekly and glimepiride  2 mg daily -Follow-up with endocrinology/Dr. Tommas soon   History of COPD -Continue albuterol  inhaler as needed -Continue Advair maintenance inhaler twice daily   Chronic hoarseness -Referral to ENT    Continue vitamin D  supplement 2000 units daily Continue your supplements for potassium magnesium  and vitamin C unless we tell you otherwise tomorrow will get your labs back   We will contact your mail-order pharmacy to help straighten out some of the medication confusion   Hadasah was seen today for follow-up.  Diagnoses and all orders for this  visit:  Edema, unspecified type -     Comprehensive metabolic panel with GFR -     CBC -     Brain natriuretic peptide -     CK isoenzymes (brain, muscle injury) -     Magnesium   Pustule  Type 2 diabetes mellitus with hyperglycemia, with long-term current use of insulin  (HCC)  Schizoaffective disorder, bipolar type (HCC)  Morbid obesity (HCC)  Hyperlipidemia associated with type 2 diabetes mellitus (HCC)  Gastroesophageal reflux disease with esophagitis without hemorrhage  Essential hypertension -     Comprehensive metabolic panel with GFR -     CBC -     Magnesium   Chronic obstructive pulmonary disease, unspecified COPD type (HCC)  Muscle cramp -     Comprehensive metabolic panel with GFR -     CBC -     Brain natriuretic peptide -     CK isoenzymes (brain, muscle injury) -     Magnesium   SOB (shortness of breath) -     Brain natriuretic peptide  Hoarseness -     Ambulatory referral to ENT  Other orders -     mupirocin  ointment (BACTROBAN ) 2 %; Apply 1 Application topically 2 (two) times daily. -     Discontinue: cyclobenzaprine  (FLEXERIL ) 10 MG tablet; Take 1 tablet (10 mg total) by mouth 2 (two) times daily as needed for muscle spasms. -     cyclobenzaprine  (FLEXERIL ) 10 MG tablet; Take 1 tablet (10 mg total) by mouth 2 (two) times daily as needed for muscle spasms.  Spent > 45 minutes face to face with patient in discussion of symptoms, evaluation, plan and recommendations.    F/u pending labs, cardiology follow up    [1]  Current Outpatient Medications on File Prior to Visit  Medication Sig Dispense Refill   amLODipine -olmesartan  (AZOR ) 10-40 MG tablet Take 1 tablet by mouth daily. 90 tablet 1   Ascorbic Acid (VITAMIN C WITH ROSE HIPS) 500 MG tablet Take 500 mg by mouth daily.     aspirin  EC 81 MG tablet Take 81 mg by mouth daily. Swallow whole.     atenolol  (TENORMIN ) 50 MG tablet Take 50 mg by mouth daily.     busPIRone  (BUSPAR ) 15 MG tablet Take 15 mg  by mouth 2 (two) times daily.     Cholecalciferol (VITAMIN D3) 50 MCG (2000 UT) capsule Take 1 capsule by mouth every day 30 capsule 11   escitalopram  (LEXAPRO ) 10 MG tablet Take 10 mg by mouth daily.     ferrous sulfate  325 (65  FE) MG EC tablet Take 1 tablet (325 mg total) by mouth daily. 90 tablet 1   fluticasone -salmeterol (ADVAIR) 250-50 MCG/ACT AEPB Inhale 1 puff twice daily, morning & at bedtime 60 each 5   glimepiride  (AMARYL ) 2 MG tablet Take 1 tablet by mouth daily with breakfast 30 tablet 5   INGREZZA 80 MG capsule Take 80 mg by mouth daily.     losartan  (COZAAR ) 50 MG tablet Take 50 mg by mouth daily.     magnesium  30 MG tablet Take 1,500 mg by mouth once.     Magnesium  Carbonate, Antacid, (MAGNESIUM  CARBONATE PO) Take 1,500 mg by mouth daily.     metoprolol  succinate (TOPROL  XL) 100 MG 24 hr tablet Take 1 tablet (100 mg total) by mouth daily. Take with or immediately following a meal. 90 tablet 1   nitroGLYCERIN (NITROSTAT) 0.4 MG SL tablet Place 0.4 mg under the tongue every 5 (five) minutes as needed for chest pain. Reported on 07/15/2015     ondansetron  (ZOFRAN -ODT) 4 MG disintegrating tablet Take 1 tablet (4 mg total) by mouth every 8 (eight) hours as needed for nausea or vomiting. 30 tablet 0   pantoprazole  (PROTONIX ) 40 MG tablet Take 1 capsule by mouth twice daily 60 tablet 11   PREBIOTIC PRODUCT PO Take by mouth.     ramelteon (ROZEREM) 8 MG tablet Take 8 mg by mouth at bedtime as needed for sleep.     rosuvastatin  (CRESTOR ) 20 MG tablet Take 1 tablet (20 mg total) by mouth daily. 90 tablet 1   spironolactone  (ALDACTONE ) 25 MG tablet Take 25 mg by mouth daily.     Suvorexant (BELSOMRA) 20 MG TABS Take 20 mg by mouth daily.     tirzepatide  (MOUNJARO ) 12.5 MG/0.5ML Pen Inject 12.5 mg into the skin once a week. 6 mL 1   VENTOLIN  HFA 108 (90 Base) MCG/ACT inhaler Inhale 2 puffs by mouth every 6 hours as needed for wheezing or shortness of breath 18 each 0   VRAYLAR 6 MG CAPS Take  1 capsule by mouth daily.     Accu-Chek Softclix Lancets lancets TEST 1 TO 2 TIMES DAILY 100 each 1   glucose blood (ACCU-CHEK GUIDE TEST) test strip TEST 1 TO 2 TIMES DAILY 100 strip 1   No current facility-administered medications on file prior to visit.   "

## 2024-03-20 NOTE — Telephone Encounter (Signed)
-----   Message from Alm Gent, PA-C sent at 03/20/2024  1:16 PM EST ----- Also call patient and get her through the plan just making sure she is clear on all the medications except for blood pressure per my notes  I am going to let Dr. Levern help straighten out her blood pressure medicines.  I would go ahead and discontinue the combination amlodipine  olmesartan  and she is already duplicating these elsewhere.

## 2024-03-20 NOTE — Progress Notes (Signed)
 Faxed note and advised them to call to schedule follow-up visit with patient

## 2024-03-20 NOTE — Telephone Encounter (Signed)
-----   Message from Alm Gent, PA-C sent at 03/20/2024  1:13 PM EST ----- Please contact her mail order pharmacy.  They are sending a bubble pack of some of her medicines in the morning and afternoon dose.  However she is on duplicates of some of the blood pressure medicines and her bubble pack does not contain at least half of the medicines she is taking, so I think this is making these more confusing than helping  It would be helpful for her to do the bubble pack once we get the medication straightened out but currently her blood pressure medicines are not accurate since she is taking duplicate of most of her blood pressure medicines from 2 different doctors.  And her bubble pack does not include her psychiatric medications.  Side recommend no order either bubble package everything except for blood pressure pills until we get it straightened out or do no bubble packing until we can clear this up

## 2024-03-21 ENCOUNTER — Ambulatory Visit: Payer: Self-pay | Admitting: Medical

## 2024-03-21 ENCOUNTER — Telehealth: Payer: Self-pay | Admitting: Medical

## 2024-03-21 DIAGNOSIS — I10 Essential (primary) hypertension: Secondary | ICD-10-CM

## 2024-03-21 MED ORDER — ONDANSETRON 4 MG PO TBDP: 4.0000 mg | ORAL_TABLET | Freq: Three times a day (TID) | ORAL | 0 refills | Status: AC | PRN

## 2024-03-21 MED ORDER — METOPROLOL SUCCINATE ER 100 MG PO TB24: 100.0000 mg | ORAL_TABLET | Freq: Every day | ORAL | 1 refills | Status: AC

## 2024-03-21 MED ORDER — VENTOLIN HFA 108 (90 BASE) MCG/ACT IN AERS: 1.0000 | INHALATION_SPRAY | Freq: Four times a day (QID) | RESPIRATORY_TRACT | 0 refills | Status: AC | PRN

## 2024-03-21 MED ORDER — MUPIROCIN 2 % EX OINT: 1.0000 | TOPICAL_OINTMENT | Freq: Two times a day (BID) | CUTANEOUS | 0 refills | Status: AC

## 2024-03-21 MED ORDER — ACCU-CHEK SOFTCLIX LANCETS MISC: 1 refills | Status: AC

## 2024-03-21 MED ORDER — FLUTICASONE-SALMETEROL 250-50 MCG/ACT IN AEPB: INHALATION_SPRAY | RESPIRATORY_TRACT | 5 refills | Status: AC

## 2024-03-21 MED ORDER — CYCLOBENZAPRINE HCL 10 MG PO TABS: 10.0000 mg | ORAL_TABLET | Freq: Two times a day (BID) | ORAL | 0 refills | Status: AC | PRN

## 2024-03-21 MED ORDER — ROSUVASTATIN CALCIUM 20 MG PO TABS: 20.0000 mg | ORAL_TABLET | Freq: Every day | ORAL | 1 refills | Status: AC

## 2024-03-21 MED ORDER — VITAMIN D3 50 MCG (2000 UT) PO CAPS: 2000.0000 [IU] | ORAL_CAPSULE | Freq: Every day | ORAL | 11 refills | Status: AC

## 2024-03-21 MED ORDER — PANTOPRAZOLE SODIUM 40 MG PO TBEC: 40.0000 mg | DELAYED_RELEASE_TABLET | Freq: Two times a day (BID) | ORAL | 11 refills | Status: AC

## 2024-03-21 MED ORDER — GLIMEPIRIDE 2 MG PO TABS: 2.0000 mg | ORAL_TABLET | Freq: Every day | ORAL | 5 refills | Status: AC

## 2024-03-21 MED ORDER — ACCU-CHEK GUIDE TEST VI STRP: ORAL_STRIP | 1 refills | Status: AC

## 2024-03-21 MED ORDER — FERROUS SULFATE 325 (65 FE) MG PO TBEC: 325.0000 mg | DELAYED_RELEASE_TABLET | Freq: Every day | ORAL | 1 refills | Status: AC

## 2024-03-21 MED ORDER — TIRZEPATIDE 12.5 MG/0.5ML ~~LOC~~ SOAJ: 12.5000 mg | SUBCUTANEOUS | 1 refills | Status: AC

## 2024-03-21 NOTE — Telephone Encounter (Unsigned)
 Copied from CRM #[id number]. Topic: Clinical - Prescription Issue >> Mar 21, 2024 10:47 AM Willma SAUNDERS wrote: Reason for CRM: Patient called [facility] this morning and advised PA [facility] discontinued the 12 medications they are currently filling. Would like to confirm with Tysinger prior to canceling on their end.  [facility]oyal can be reached at [phone]  Reference # [id number]

## 2024-03-21 NOTE — Addendum Note (Signed)
 Addended by: VICCI HUSBAND A on: 03/21/2024 12:29 PM   Modules accepted: Orders

## 2024-03-21 NOTE — Progress Notes (Signed)
 Send copy of labs to Dr. Talitha office as well as my note from yesterday  Results:  Kidney marker shows chronic kidney disease but stable, liver and electrolytes okay.  Blood counts normal.  Magnesium  normal.  Still pending BNP heart marker and muscle enzymes  You should be getting a phone call from Dr. Talitha office to follow-up ASAP to get your blood pressure pills straightened out  For now  stop the combination pill Amlodipine  Olmesartan  10/40 mg since you have separate pill bottles for similar medications  Follow-up with your psychiatry office and diabetes specialist soon to review medications  We will call with other lab results

## 2024-03-21 NOTE — Telephone Encounter (Signed)
 I spoke with Pharmacist [facility] and [facility] and she said they only specialize in the multidose packs. Pt would need to go through [facility] to get regular pills bottles. Advised we are not discontinuing the medications we just wanted them not to be in multidose packs at this time until we straighten out her pills when she see cardiology   They were going to reach out to her and let her know that

## 2024-03-21 NOTE — Telephone Encounter (Signed)
 Pt was notified and was told to call mail order to cancel pill pack for now

## 2024-03-21 NOTE — Telephone Encounter (Signed)
 Sent new meds to [facility]

## 2024-03-21 NOTE — Telephone Encounter (Unsigned)
 Copied from CRM [id number]. Topic: Referral - Request for Referral >> Mar 21, 2024  1:06 PM Willma R wrote: Did the patient discuss referral with their provider in the last year? Yes  Appointment offered? No  Type of order/referral and detailed reason for visit: [facility], is established but needs new referral per requirement of [employer] Diagnosis codes: I10, E78.2   Preference of office, provider, location:  [facility] Fax: [phone]  If referral order, have you been seen by this specialty before? yes  Can we respond through MyChart? No

## 2024-03-22 NOTE — Telephone Encounter (Signed)
 Placed referral

## 2024-03-23 LAB — COMPREHENSIVE METABOLIC PANEL WITH GFR
ALT: 16 [IU]/L (ref 0–32)
AST: 17 [IU]/L (ref 0–40)
Albumin: 4.1 g/dL (ref 3.8–4.9)
Alkaline Phosphatase: 110 [IU]/L (ref 49–135)
BUN/Creatinine Ratio: 13 (ref 9–23)
BUN: 15 mg/dL (ref 6–24)
Bilirubin Total: <0.2 mg/dL (ref 0.0–1.2)
CO2: 22 mmol/L (ref 20–29)
Calcium: 9.5 mg/dL (ref 8.7–10.2)
Chloride: 102 mmol/L (ref 96–106)
Creatinine, Ser: 1.14 mg/dL — ABNORMAL HIGH (ref 0.57–1.00)
Globulin, Total: 3 g/dL (ref 1.5–4.5)
Glucose: 151 mg/dL — ABNORMAL HIGH (ref 70–99)
Potassium: 4.7 mmol/L (ref 3.5–5.2)
Sodium: 139 mmol/L (ref 134–144)
Total Protein: 7.1 g/dL (ref 6.0–8.5)
eGFR: 55 mL/min/{1.73_m2} — ABNORMAL LOW

## 2024-03-23 LAB — CBC
Hematocrit: 36.6 % (ref 34.0–46.6)
Hemoglobin: 11.7 g/dL (ref 11.1–15.9)
MCH: 27.9 pg (ref 26.6–33.0)
MCHC: 32 g/dL (ref 31.5–35.7)
MCV: 87 fL (ref 79–97)
Platelets: 342 10*3/uL (ref 150–450)
RBC: 4.2 x10E6/uL (ref 3.77–5.28)
RDW: 12.9 % (ref 11.7–15.4)
WBC: 8.8 10*3/uL (ref 3.4–10.8)

## 2024-03-23 LAB — CK ISOENZYMES
CK-BB: 0 %
CK-MB: 0 % (ref 0–3)
CK-MM: 100 % (ref 97–100)
Macro Type 1: 0 %
Macro Type 2: 0 %
Total CK: 258 U/L — ABNORMAL HIGH (ref 32–182)

## 2024-03-23 LAB — BRAIN NATRIURETIC PEPTIDE: BNP: 14.5 pg/mL (ref 0.0–100.0)

## 2024-03-23 LAB — MAGNESIUM: Magnesium: 1.7 mg/dL (ref 1.6–2.3)

## 2024-03-24 NOTE — Progress Notes (Signed)
 Results thru my chart

## 2024-03-26 ENCOUNTER — Telehealth: Payer: Self-pay | Admitting: Medical

## 2024-03-26 NOTE — Telephone Encounter (Signed)
 Copied from CRM #[id number]. Topic: Medical Record Request - Provider/Facility Request >> Mar 21, 2024  1:34 PM Berwyn MATSU wrote: Reason for CRM: Patient called in from Dr. Talitha office stating that he needs copy of labs and notes from PA [facility].   May you please assist.

## 2024-04-04 ENCOUNTER — Other Ambulatory Visit: Payer: Self-pay | Admitting: Medical

## 2024-04-04 NOTE — Telephone Encounter (Signed)
 SABRA

## 2024-04-04 NOTE — Telephone Encounter (Signed)
 Crestor  already refilled recently and amlodipine  is suppose to come from Cardiology since she was on so many medications for bp.

## 2024-12-17 ENCOUNTER — Ambulatory Visit: Payer: Self-pay
